# Patient Record
Sex: Female | Born: 1950 | Race: White | Hispanic: No | Marital: Married | Smoking: Never smoker
Health system: Southern US, Community
[De-identification: ages and names within clinical notes are randomized; demographics above are authoritative.]

## PROBLEM LIST (undated history)

## (undated) DIAGNOSIS — D649 Anemia, unspecified: Secondary | ICD-10-CM

## (undated) DIAGNOSIS — R011 Cardiac murmur, unspecified: Secondary | ICD-10-CM

## (undated) DIAGNOSIS — Z9889 Other specified postprocedural states: Secondary | ICD-10-CM

## (undated) DIAGNOSIS — E041 Nontoxic single thyroid nodule: Secondary | ICD-10-CM

## (undated) DIAGNOSIS — R112 Nausea with vomiting, unspecified: Secondary | ICD-10-CM

## (undated) DIAGNOSIS — M1990 Unspecified osteoarthritis, unspecified site: Secondary | ICD-10-CM

## (undated) DIAGNOSIS — Z8711 Personal history of peptic ulcer disease: Secondary | ICD-10-CM

## (undated) DIAGNOSIS — G43909 Migraine, unspecified, not intractable, without status migrainosus: Secondary | ICD-10-CM

## (undated) DIAGNOSIS — F32A Depression, unspecified: Secondary | ICD-10-CM

## (undated) DIAGNOSIS — I340 Nonrheumatic mitral (valve) insufficiency: Secondary | ICD-10-CM

## (undated) DIAGNOSIS — I341 Nonrheumatic mitral (valve) prolapse: Secondary | ICD-10-CM

## (undated) DIAGNOSIS — K219 Gastro-esophageal reflux disease without esophagitis: Secondary | ICD-10-CM

## (undated) DIAGNOSIS — K8689 Other specified diseases of pancreas: Principal | ICD-10-CM

## (undated) DIAGNOSIS — F419 Anxiety disorder, unspecified: Secondary | ICD-10-CM

## (undated) DIAGNOSIS — F329 Major depressive disorder, single episode, unspecified: Secondary | ICD-10-CM

## (undated) DIAGNOSIS — R079 Chest pain, unspecified: Secondary | ICD-10-CM

## (undated) DIAGNOSIS — R0609 Other forms of dyspnea: Secondary | ICD-10-CM

## (undated) HISTORY — PX: BREAST BIOPSY: SHX20

## (undated) HISTORY — DX: Chest pain, unspecified: R07.9

## (undated) HISTORY — DX: Other specified diseases of pancreas: K86.89

## (undated) HISTORY — DX: Nonrheumatic mitral (valve) insufficiency: I34.0

## (undated) HISTORY — PX: BREAST LUMPECTOMY: SHX2

## (undated) HISTORY — DX: Other forms of dyspnea: R06.09

## (undated) HISTORY — PX: TUBAL LIGATION: SHX77

## (undated) HISTORY — DX: Unspecified osteoarthritis, unspecified site: M19.90

## (undated) HISTORY — PX: BACK SURGERY: SHX140

## (undated) HISTORY — DX: Nonrheumatic mitral (valve) prolapse: I34.1

## (undated) HISTORY — PX: COLONOSCOPY: SHX174

## (undated) HISTORY — PX: EYE MUSCLE SURGERY: SHX370

## (undated) HISTORY — DX: Nontoxic single thyroid nodule: E04.1

---

## 1990-12-30 HISTORY — PX: LUMBAR DISC SURGERY: SHX700

## 1998-11-09 ENCOUNTER — Other Ambulatory Visit: Admission: RE | Admit: 1998-11-09 | Discharge: 1998-11-09 | Payer: Self-pay | Admitting: Obstetrics and Gynecology

## 2001-01-30 ENCOUNTER — Other Ambulatory Visit: Admission: RE | Admit: 2001-01-30 | Discharge: 2001-01-30 | Payer: Self-pay | Admitting: Obstetrics and Gynecology

## 2002-02-04 ENCOUNTER — Other Ambulatory Visit: Admission: RE | Admit: 2002-02-04 | Discharge: 2002-02-04 | Payer: Self-pay | Admitting: Obstetrics and Gynecology

## 2003-05-19 ENCOUNTER — Other Ambulatory Visit: Admission: RE | Admit: 2003-05-19 | Discharge: 2003-05-19 | Payer: Self-pay | Admitting: Obstetrics and Gynecology

## 2004-07-04 ENCOUNTER — Encounter: Admission: RE | Admit: 2004-07-04 | Discharge: 2004-07-04 | Payer: Self-pay | Admitting: Obstetrics and Gynecology

## 2004-07-04 ENCOUNTER — Encounter (INDEPENDENT_AMBULATORY_CARE_PROVIDER_SITE_OTHER): Payer: Self-pay | Admitting: Specialist

## 2004-07-18 ENCOUNTER — Other Ambulatory Visit: Admission: RE | Admit: 2004-07-18 | Discharge: 2004-07-18 | Payer: Self-pay | Admitting: Obstetrics and Gynecology

## 2004-08-07 ENCOUNTER — Encounter: Admission: RE | Admit: 2004-08-07 | Discharge: 2004-08-07 | Payer: Self-pay | Admitting: General Surgery

## 2004-08-07 ENCOUNTER — Ambulatory Visit (HOSPITAL_COMMUNITY): Admission: RE | Admit: 2004-08-07 | Discharge: 2004-08-07 | Payer: Self-pay | Admitting: General Surgery

## 2004-08-07 ENCOUNTER — Encounter (INDEPENDENT_AMBULATORY_CARE_PROVIDER_SITE_OTHER): Payer: Self-pay | Admitting: Specialist

## 2004-08-07 ENCOUNTER — Ambulatory Visit (HOSPITAL_BASED_OUTPATIENT_CLINIC_OR_DEPARTMENT_OTHER): Admission: RE | Admit: 2004-08-07 | Discharge: 2004-08-07 | Payer: Self-pay | Admitting: General Surgery

## 2005-10-22 ENCOUNTER — Other Ambulatory Visit: Admission: RE | Admit: 2005-10-22 | Discharge: 2005-10-22 | Payer: Self-pay | Admitting: Obstetrics and Gynecology

## 2005-11-26 ENCOUNTER — Ambulatory Visit: Payer: Self-pay | Admitting: Oncology

## 2005-12-26 ENCOUNTER — Ambulatory Visit: Payer: Self-pay | Admitting: Gastroenterology

## 2006-01-16 ENCOUNTER — Ambulatory Visit: Payer: Self-pay | Admitting: Gastroenterology

## 2006-02-14 ENCOUNTER — Ambulatory Visit: Payer: Self-pay | Admitting: Oncology

## 2006-04-11 ENCOUNTER — Ambulatory Visit: Payer: Self-pay | Admitting: Oncology

## 2006-07-04 ENCOUNTER — Ambulatory Visit: Payer: Self-pay | Admitting: Oncology

## 2006-07-07 LAB — CBC WITH DIFFERENTIAL (CANCER CENTER ONLY)
BASO%: 1.2 % (ref 0.0–2.0)
HGB: 15.5 g/dL (ref 11.6–15.9)
LYMPH#: 1.7 10*3/uL (ref 0.9–3.3)
LYMPH%: 25.7 % (ref 14.0–48.0)
MCH: 30.2 pg (ref 26.0–34.0)
MCHC: 32.7 g/dL (ref 32.0–36.0)
MCV: 92 fL (ref 81–101)
NEUT#: 4.1 10*3/uL (ref 1.5–6.5)
NEUT%: 60.9 % (ref 39.6–80.0)
Platelets: 233 10*3/uL (ref 145–400)
RBC: 5.12 10*6/uL (ref 3.70–5.32)
WBC: 6.7 10*3/uL (ref 3.9–10.0)

## 2006-07-07 LAB — COMPREHENSIVE METABOLIC PANEL
ALT: 8 U/L (ref 0–40)
BUN: 23 mg/dL (ref 6–23)
CO2: 27 mEq/L (ref 19–32)
Calcium: 9.6 mg/dL (ref 8.4–10.5)
Glucose, Bld: 102 mg/dL — ABNORMAL HIGH (ref 70–99)

## 2006-07-07 LAB — IRON AND TIBC
%SAT: 36 % (ref 20–55)
TIBC: 316 ug/dL (ref 250–470)
UIBC: 202 ug/dL

## 2007-01-02 ENCOUNTER — Ambulatory Visit: Payer: Self-pay | Admitting: Oncology

## 2007-01-05 LAB — CBC WITH DIFFERENTIAL (CANCER CENTER ONLY)
BASO%: 0.8 % (ref 0.0–2.0)
HCT: 45.8 % (ref 34.8–46.6)
HGB: 15.5 g/dL (ref 11.6–15.9)
MCHC: 33.9 g/dL (ref 32.0–36.0)
MCV: 92 fL (ref 81–101)
MONO#: 0.5 10*3/uL (ref 0.1–0.9)
MONO%: 7.2 % (ref 0.0–13.0)
RDW: 11.6 % (ref 10.5–14.6)

## 2007-01-05 LAB — IRON AND TIBC
%SAT: 39 % (ref 20–55)
TIBC: 368 ug/dL (ref 250–470)
UIBC: 225 ug/dL

## 2007-02-27 ENCOUNTER — Ambulatory Visit: Payer: Self-pay | Admitting: Oncology

## 2007-07-06 ENCOUNTER — Ambulatory Visit: Payer: Self-pay | Admitting: Oncology

## 2007-07-07 LAB — CBC WITH DIFFERENTIAL (CANCER CENTER ONLY)
EOS%: 10.3 % — ABNORMAL HIGH (ref 0.0–7.0)
Eosinophils Absolute: 0.7 10*3/uL — ABNORMAL HIGH (ref 0.0–0.5)
LYMPH#: 1.8 10*3/uL (ref 0.9–3.3)
MCH: 31.2 pg (ref 26.0–34.0)
NEUT#: 3.4 10*3/uL (ref 1.5–6.5)
Platelets: 215 10*3/uL (ref 145–400)
RBC: 5.12 10*6/uL (ref 3.70–5.32)
RDW: 12.2 % (ref 10.5–14.6)

## 2007-07-07 LAB — COMPREHENSIVE METABOLIC PANEL
ALT: 15 U/L (ref 0–35)
Albumin: 3.9 g/dL (ref 3.5–5.2)
Alkaline Phosphatase: 64 U/L (ref 39–117)
BUN: 17 mg/dL (ref 6–23)
CO2: 27 mEq/L (ref 19–32)
Potassium: 4 mEq/L (ref 3.5–5.3)
Sodium: 141 mEq/L (ref 135–145)
Total Protein: 5.9 g/dL — ABNORMAL LOW (ref 6.0–8.3)

## 2007-07-07 LAB — IRON AND TIBC: UIBC: 160 ug/dL

## 2007-07-07 LAB — FERRITIN: Ferritin: 134 ng/mL (ref 10–291)

## 2007-11-09 ENCOUNTER — Ambulatory Visit: Payer: Self-pay | Admitting: Oncology

## 2007-11-10 LAB — COMPREHENSIVE METABOLIC PANEL
ALT: 12 U/L (ref 0–35)
BUN: 12 mg/dL (ref 6–23)
CO2: 27 mEq/L (ref 19–32)
Calcium: 9.6 mg/dL (ref 8.4–10.5)
Chloride: 107 mEq/L (ref 96–112)
Creatinine, Ser: 0.69 mg/dL (ref 0.40–1.20)
Glucose, Bld: 97 mg/dL (ref 70–99)
Potassium: 4 mEq/L (ref 3.5–5.3)
Total Protein: 6.8 g/dL (ref 6.0–8.3)

## 2007-11-10 LAB — CBC WITH DIFFERENTIAL (CANCER CENTER ONLY)
Eosinophils Absolute: 0.4 10*3/uL (ref 0.0–0.5)
HCT: 45.2 % (ref 34.8–46.6)
LYMPH%: 24.9 % (ref 14.0–48.0)
MCH: 31 pg (ref 26.0–34.0)
MCHC: 34 g/dL (ref 32.0–36.0)
MCV: 91 fL (ref 81–101)
MONO#: 0.5 10*3/uL (ref 0.1–0.9)
MONO%: 5.8 % (ref 0.0–13.0)
RBC: 4.96 10*6/uL (ref 3.70–5.32)
WBC: 7.9 10*3/uL (ref 3.9–10.0)

## 2007-11-10 LAB — IRON AND TIBC
%SAT: 47 % (ref 20–55)
Iron: 146 ug/dL — ABNORMAL HIGH (ref 42–145)
TIBC: 312 ug/dL (ref 250–470)
UIBC: 166 ug/dL

## 2008-04-15 ENCOUNTER — Ambulatory Visit: Payer: Self-pay | Admitting: Oncology

## 2008-04-20 LAB — IRON AND TIBC
%SAT: 30 % (ref 20–55)
Iron: 84 ug/dL (ref 42–145)

## 2008-04-20 LAB — CBC WITH DIFFERENTIAL (CANCER CENTER ONLY)
Eosinophils Absolute: 0.3 10*3/uL (ref 0.0–0.5)
HGB: 15 g/dL (ref 11.6–15.9)
LYMPH%: 27 % (ref 14.0–48.0)
MCV: 87 fL (ref 81–101)
MONO%: 6.8 % (ref 0.0–13.0)
Platelets: 243 10*3/uL (ref 145–400)
WBC: 6.5 10*3/uL (ref 3.9–10.0)

## 2008-04-20 LAB — FERRITIN: Ferritin: 79 ng/mL (ref 10–291)

## 2008-11-02 ENCOUNTER — Ambulatory Visit: Payer: Self-pay | Admitting: Oncology

## 2008-11-03 LAB — CBC WITH DIFFERENTIAL (CANCER CENTER ONLY)
BASO#: 0.1 10*3/uL (ref 0.0–0.2)
Eosinophils Absolute: 0.4 10*3/uL (ref 0.0–0.5)
HCT: 44.1 % (ref 34.8–46.6)
HGB: 15.3 g/dL (ref 11.6–15.9)
MCH: 31.1 pg (ref 26.0–34.0)
MONO%: 6.9 % (ref 0.0–13.0)
NEUT#: 4.4 10*3/uL (ref 1.5–6.5)
NEUT%: 59.2 % (ref 39.6–80.0)
Platelets: 206 10*3/uL (ref 145–400)
RBC: 4.91 10*6/uL (ref 3.70–5.32)
WBC: 7.4 10*3/uL (ref 3.9–10.0)

## 2008-11-03 LAB — IRON AND TIBC: %SAT: 30 % (ref 20–55)

## 2008-11-03 LAB — FERRITIN: Ferritin: 97 ng/mL (ref 10–291)

## 2011-05-17 NOTE — Op Note (Signed)
Becky Murphy, Becky Murphy                          ACCOUNT NO.:  [id number]   MEDICAL RECORD NO.:  [id number]                   PATIENT TYPE:  AMB   LOCATION:  DSC                                  FACILITY:  [facility]   PHYSICIAN:  Timothy E. Earlene Plater, M.D.              DATE OF BIRTH:  [birth year]   DATE OF PROCEDURE:  08/07/2004  DATE OF DISCHARGE:                                 OPERATIVE REPORT   PREOPERATIVE DIAGNOSIS:  Mass, left breast.   POSTOPERATIVE DIAGNOSIS:  Mass, left breast.   OPERATION PERFORMED:  Needle localized left breast biopsy.   SURGEON:  Timothy E. Earlene Plater, M.D.   ANESTHESIA:  Local standby.   INDICATIONS FOR PROCEDURE:  Ms. Bisesi has a family history of breast cancer,  has a persistent abnormality on mammography and though a core biopsy showed  fibroadenoma, she wishes to have it completely removed after careful  discussion.  She was at the breast center of [facility] this morning, had a  needle localization, was seen preop, identified, and the permit signed.   DESCRIPTION OF PROCEDURE:  The patient was taken back to the operating room  and placed supine.  IV started and sedation given.  The left breast was  prepped and draped.  The wire entered the breast far lateral at the 3  o'clock position.  I elected to make my incision at the entry point and  follow the  needle into the mass.  This was accomplished after  administration of 0.25% Marcaine with epinephrine with epinephrine.  Once  within the breast tissue, the mass was palpable.  It was completely  dissected around and removed.  I did bisect the specimen myself and I think  the tissue is suspicious but we will await pathology.  The specimen  mammogram was returned as specimen within the tissue.  Bleeding was  controlled with a cautery.  Wound was closed in layers with 3-0  Monocryl, Steri-Strips and dry sterile dressing applied.  Counts correct.  She tolerated it well and was removed to the recovery room in good  condition.   Written and verbal instructions given, along with Vicodin  #24 and she will  be seen and followed in the office.                                               Timothy E. Earlene Plater, M.D.    TED/MEDQ  D:  08/07/2004  T:  08/07/2004  Job:  [id number]   cc:   Marcelino Duster L. Vincente Poli, M.D.  [address], [address]  [place]  [place] [address]  Fax: [phone]   [facility]

## 2012-07-12 ENCOUNTER — Encounter (HOSPITAL_COMMUNITY): Payer: Self-pay | Admitting: Emergency Medicine

## 2012-07-12 ENCOUNTER — Emergency Department (HOSPITAL_COMMUNITY)
Admission: EM | Admit: 2012-07-12 | Discharge: 2012-07-12 | Disposition: A | Discharge status: Home / Self Care | Payer: BC Managed Care – PPO | Source: Home / Self Care | Attending: Emergency Medicine | Admitting: Emergency Medicine

## 2012-07-12 DIAGNOSIS — B029 Zoster without complications: Secondary | ICD-10-CM

## 2012-07-12 HISTORY — DX: Anxiety disorder, unspecified: F41.9

## 2012-07-12 MED ORDER — PREDNISONE 20 MG PO TABS: 20.0000 mg | ORAL_TABLET | Freq: Every day | ORAL | Status: AC

## 2012-07-12 MED ORDER — HYDROCODONE-ACETAMINOPHEN 5-500 MG PO TABS: 1.0000 | ORAL_TABLET | Freq: Four times a day (QID) | ORAL | Status: AC | PRN

## 2012-07-12 MED ORDER — ACYCLOVIR 400 MG PO TABS: 800.0000 mg | ORAL_TABLET | Freq: Three times a day (TID) | ORAL | Status: AC

## 2012-07-12 NOTE — ED Provider Notes (Signed)
History     CSN: [id number]  Arrival date & time 07/12/12  1543   First MD Initiated Contact with Patient 07/12/12 1545      Chief Complaint  Patient presents with  . Herpes Zoster    (Consider location/radiation/quality/duration/timing/severity/associated sxs/prior treatment) HPI Comments: Patient presents urgent care describing that for about 2 weeks she started having this rash on the right side of her neck and upper back moving towards her right armpit. The pain is severe, and burning in character. "As I thought originally that this was poison ivy or poison oak as he was doing some yard work". Someone I know told me that this could be shingles. It's still hurting all over the right upper part my chest and now moving towards my armpit. It also itches.  Patient denies any systemic symptoms or constitutional symptoms such as fevers, generalized malaise, arthralgias, myalgias, headaches, no nausea vomiting or eye discomfort or excessive tearing.  The history is provided by the patient.    Past Medical History  Diagnosis Date  . Anxiety     Past Surgical History  Procedure Date  . Back surgery     No family history on file.  History  Substance Use Topics  . Smoking status: Never Smoker   . Smokeless tobacco: Not on file  . Alcohol Use: Yes    OB History    Grav Para Term Preterm Abortions TAB SAB Ect Mult Living                  Review of Systems  Constitutional: Positive for activity change. Negative for fever, chills, fatigue and unexpected weight change.  HENT: Negative for facial swelling, neck pain and neck stiffness.   Skin: Positive for rash. Negative for wound.  Neurological: Negative for dizziness, weakness and numbness.    Allergies  Review of patient's allergies indicates no known allergies.  Home Medications   Current Outpatient Rx  Name Route Sig Dispense Refill  . FLUOXETINE HCL 40 MG PO CAPS Oral Take 40 mg by mouth daily.    . ACYCLOVIR 400  MG PO TABS Oral Take 2 tablets (800 mg total) by mouth 3 (three) times daily. 30 tablet 0  . HYDROCODONE-ACETAMINOPHEN 5-500 MG PO TABS Oral Take 1-2 tablets by mouth every 6 (six) hours as needed for pain. 15 tablet 0  . PREDNISONE 20 MG PO TABS Oral Take 1 tablet (20 mg total) by mouth daily. 10 tablet 0    BP 126/84  Pulse 72  Temp 98.1 F (36.7 C) (Oral)  Resp 20  SpO2 96%  Physical Exam  Nursing note and vitals reviewed. Constitutional: She appears well-developed and well-nourished.  Non-toxic appearance. She does not have a sickly appearance. She does not appear ill. No distress.  HENT:  Head: Normocephalic.  Eyes: Conjunctivae are normal.  Neck: Trachea normal and normal range of motion. Neck supple. No Brudzinski's sign and no Kernig's sign noted.  Neurological: She is alert.  Skin: Purpura and rash noted. Rash is papular and vesicular. There is erythema.       ED Course  Procedures (including critical care time)  Labs Reviewed - No data to display No results found.   1. Herpes zoster       MDM  C4-C5, dermatome distribution of zoster. Patient initial rash and discomfort originated about 2 weeks ago. The patient alleges that she has 2 new patches one towards the axillary area and the other one approximating her sternum age that  have developed within the last 2-3 days.        Jimmie Molly, MD 07/12/12 [phone]

## 2012-07-12 NOTE — ED Notes (Signed)
Patient not part of this nurses assignment-delay in info entry

## 2012-07-12 NOTE — ED Notes (Signed)
Reports painful, itchy rash to right upper chest, circling right side of neck to the back of neck.  Reports pain shooting down right arm.  Onset 2 weeks ago.

## 2012-11-05 ENCOUNTER — Other Ambulatory Visit: Payer: Self-pay | Admitting: Obstetrics and Gynecology

## 2013-11-08 ENCOUNTER — Other Ambulatory Visit: Payer: Self-pay | Admitting: Obstetrics and Gynecology

## 2014-11-10 ENCOUNTER — Other Ambulatory Visit: Payer: Self-pay | Admitting: Obstetrics and Gynecology

## 2014-11-11 LAB — CYTOLOGY - PAP

## 2015-05-15 ENCOUNTER — Ambulatory Visit (INDEPENDENT_AMBULATORY_CARE_PROVIDER_SITE_OTHER): Payer: BC Managed Care – PPO | Admitting: Physician Assistant

## 2015-05-15 VITALS — BP 120/86 | HR 63 | Temp 98.6°F | Resp 16 | Ht 64.25 in | Wt 142.6 lb

## 2015-05-15 DIAGNOSIS — T148 Other injury of unspecified body region: Secondary | ICD-10-CM

## 2015-05-15 DIAGNOSIS — W57XXXA Bitten or stung by nonvenomous insect and other nonvenomous arthropods, initial encounter: Secondary | ICD-10-CM | POA: Diagnosis not present

## 2015-05-15 DIAGNOSIS — R110 Nausea: Secondary | ICD-10-CM | POA: Diagnosis not present

## 2015-05-15 DIAGNOSIS — R252 Cramp and spasm: Secondary | ICD-10-CM | POA: Diagnosis not present

## 2015-05-15 DIAGNOSIS — R011 Cardiac murmur, unspecified: Secondary | ICD-10-CM | POA: Diagnosis not present

## 2015-05-15 LAB — COMPLETE METABOLIC PANEL WITH GFR
ALK PHOS: 68 U/L (ref 39–117)
ALT: 15 U/L (ref 0–35)
AST: 22 U/L (ref 0–37)
Albumin: 4.4 g/dL (ref 3.5–5.2)
BILIRUBIN TOTAL: 0.9 mg/dL (ref 0.2–1.2)
BUN: 12 mg/dL (ref 6–23)
CO2: 21 mEq/L (ref 19–32)
Calcium: 9.6 mg/dL (ref 8.4–10.5)
Chloride: 106 mEq/L (ref 96–112)
Creat: 0.62 mg/dL (ref 0.50–1.10)
GFR, Est African American: >89 mL/min
GFR, Est Non African American: >89 mL/min
GLUCOSE: 89 mg/dL (ref 70–99)
Potassium: 3.9 mEq/L (ref 3.5–5.3)
Sodium: 140 mEq/L (ref 135–145)
Total Protein: 6.8 g/dL (ref 6.0–8.3)

## 2015-05-15 LAB — CBC
HEMATOCRIT: 45 % (ref 36.0–46.0)
Hemoglobin: 15.8 g/dL — ABNORMAL HIGH (ref 12.0–15.0)
MCH: 31.5 pg (ref 26.0–34.0)
MCHC: 35.1 g/dL (ref 30.0–36.0)
MCV: 89.6 fL (ref 78.0–100.0)
MPV: 10.2 fL (ref 8.6–12.4)
PLATELETS: 212 10*3/uL (ref 150–400)
RBC: 5.02 MIL/uL (ref 3.87–5.11)
RDW: 13.9 % (ref 11.5–15.5)
WBC: 6.8 10*3/uL (ref 4.0–10.5)

## 2015-05-15 MED ORDER — DOXYCYCLINE HYCLATE 100 MG PO CAPS: 100.0000 mg | ORAL_CAPSULE | Freq: Two times a day (BID) | ORAL | Status: AC

## 2015-05-15 NOTE — Progress Notes (Signed)
[facility] [address], [place] [address] [phone]  Date:  05/15/2015   Name:  Becky RunningSandra H Murphy   DOB:  [birth year]   MRN:  [id number]  PCP:  No primary care provider on file.    Chief Complaint: Rash; Pruritis; and Nausea   History of Present Illness:  Becky RunningSandra H Murphy is a 64 y.o. very pleasant female patient who presents with the following:  She reports more than one week of a bump that appeared and has gradually increased in size.  Following an outdoor event in a wooded area, as she was riding home, she felt a small bump the right side of her abdomen, she scratched the way home.  When she looked, there was a small red spot with a white center, and a red circle around the area.  Since the initial siting, the outer ring has increased in size, and has been bluish and bruised looking.  She states that it just felt sensitive.  After several days of it's presence she also noticed that she would have a wave a nausea that would last for seconds.  She also had a cramping right leg and toes.  There is no fever, though she has taken ibuprofen and aspirin throughout.  She has no dyspnea or sob.  She ha never had an allergic reaction to insects.  She never saw any insect or tic on her or her contacts.  She has no generalized body aches or malaise, though she does feel more fatigued.  Murmur: She has never been told she has a murmur.  She denies chest pains, sob, dyspnea, palpitations, syncope, cough, or leg swelling.  There are no active problems to display for this patient.   Past Medical History  Diagnosis Date  . Anxiety   . Arthritis     Past Surgical History  Procedure Laterality Date  . Back surgery    . Back surgery  1992    History  Substance Use Topics  . Smoking status: Never Smoker   . Smokeless tobacco: Not on file  . Alcohol Use: Yes    Family History  Problem Relation Age of Onset  . Cancer Mother   . Cancer Father   . Cancer Sister      No Known Allergies  Medication list has been reviewed and updated.  Current Outpatient Prescriptions on File Prior to Visit  Medication Sig Dispense Refill  . FLUoxetine (PROZAC) 40 MG capsule Take 40 mg by mouth daily.     No current facility-administered medications on file prior to visit.    Review of Systems: ROS othewise unremarkable unless listed above.    Physical Examination: Filed Vitals:   05/15/15 0927  BP: 120/86  Pulse: 63  Temp: 98.6 F (37 C)  Resp: 63   Filed Vitals:   05/15/15 0927  Height: 5' 4.25" (1.632 m)  Weight: 142 lb 9.6 oz (64.683 kg)   Body mass index is 24.29 kg/(m^2). Ideal Body Weight: Weight in (lb) to have BMI = 25: 146.5  Physical Exam  Constitutional: She is oriented to person, place, and time. She appears well-developed and well-nourished. No distress.  HENT:  Head: Normocephalic and atraumatic.  Eyes: Conjunctivae are normal. Pupils are equal, round, and reactive to light. No scleral icterus.  Neck: Normal range of motion.  Cardiovascular: Normal rate, regular rhythm and intact distal pulses.  Exam reveals no friction rub.   Murmur (2-3 grade midsystolic murmur) heard. Pulmonary/Chest: Effort normal  and breath sounds normal. No respiratory distress. She has no wheezes.  Lymphadenopathy:    She has no cervical adenopathy.  Neurological: She is alert and oriented to person, place, and time.  Normal ocular movement  Skin: Skin is warm and dry. She is not diaphoretic.  Annular shaped lesion about 8cm diameter, central clearing, with mild petechiae surrounding a papular dry site.  Very mild tenderness at this area.  Site erythema migrans in appearance.   Psychiatric: She has a normal mood and affect. Her behavior is normal.     Assessment and Plan: 64 year old female is here today for a lesion at right side of abdomen.  Diff dx.: cellulitis vs. Lyme disease. -Placing on doxycycline to cover for both.  Pending lyme disease lab  results, may need to lengthen therapy to 21 days. -She declines zofran at this time.   -Referral to cardiology for work up of this murmur, as this appears to be a new finding.  Insect bite - Plan: CBC, COMPLETE METABOLIC PANEL WITH GFR, B. burgdorfi antibodies, doxycycline (VIBRAMYCIN) 100 MG capsule  Nausea without vomiting - Plan: CBC, COMPLETE METABOLIC PANEL WITH GFR, doxycycline (VIBRAMYCIN) 100 MG capsule  Cramps of right lower extremity - Plan: B. burgdorfi antibodies, doxycycline (VIBRAMYCIN) 100 MG capsule  Midsystolic murmur - Plan: Ambulatory referral to Cardiology  Trena PlattStephanie English, PA-C [facility] 5/16/201611:16 AM

## 2015-05-15 NOTE — Patient Instructions (Addendum)
Keep site clean with washing site twice per day with soap and water.   Please await referral for cardiology consult. You can take ibuprofen/tylenol for your headache.  Please let us know if this is not improving.   I will be in contact within the next 10-14 days. Insect Bite Mosquitoes, flies, fleas, bedbugs, and many other insects can bite. Insect bites are different from insect stings. A sting is when venom is injected into the skin. Some insect bites can transmit infectious diseases. SYMPTOMS  Insect bites usually turn red, swell, and itch for 2 to 4 days. They often go away on their own. TREATMENT  Your caregiver may prescribe antibiotic medicines if a bacterial infection develops in the bite. HOME CARE INSTRUCTIONS  Do not scratch the bite area.  Keep the bite area clean and dry. Wash the bite area thoroughly with soap and water.  Put ice or cool compresses on the bite area.  Put ice in a plastic bag.  Place a towel between your skin and the bag.  Leave the ice on for 20 minutes, 4 times a day for the first 2 to 3 days, or as directed.  You may apply a baking soda paste, cortisone cream, or calamine lotion to the bite area as directed by your caregiver. This can help reduce itching and swelling.  Only take over-the-counter or prescription medicines as directed by your caregiver.  If you are given antibiotics, take them as directed. Finish them even if you start to feel better. You may need a tetanus shot if:  You cannot remember when you had your last tetanus shot.  You have never had a tetanus shot.  The injury broke your skin. If you get a tetanus shot, your arm may swell, get red, and feel warm to the touch. This is common and not a problem. If you need a tetanus shot and you choose not to have one, there is a rare chance of getting tetanus. Sickness from tetanus can be serious. SEEK IMMEDIATE MEDICAL CARE IF:   You have increased pain, redness, or swelling in the bite  area.  You see a red line on the skin coming from the bite.  You have a fever.  You have joint pain.  You have a headache or neck pain.  You have unusual weakness.  You have a rash.  You have chest pain or shortness of breath.  You have abdominal pain, nausea, or vomiting.  You feel unusually tired or sleepy. MAKE SURE YOU:   Understand these instructions.  Will watch your condition.  Will get help right away if you are not doing well or get worse. Document Released: 01/23/2005 Document Revised: 03/09/2012 Document Reviewed: 07/17/2011 [facility] Patient Information 2015 [employer] [place]. This information is not intended to replace advice given to you by your health care provider. Make sure you discuss any questions you have with your health care provider.

## 2015-05-16 LAB — B. BURGDORFI ANTIBODIES: B burgdorferi Ab IgG+IgM: 0.66 {ISR}

## 2015-11-02 ENCOUNTER — Observation Stay (HOSPITAL_COMMUNITY)
Admission: EM | Admit: 2015-11-02 | Discharge: 2015-11-03 | Disposition: A | Discharge status: Home / Self Care | Payer: BC Managed Care – PPO | Attending: Internal Medicine | Admitting: Internal Medicine

## 2015-11-02 ENCOUNTER — Other Ambulatory Visit: Payer: Self-pay

## 2015-11-02 ENCOUNTER — Emergency Department (HOSPITAL_COMMUNITY): Payer: BC Managed Care – PPO

## 2015-11-02 ENCOUNTER — Encounter (HOSPITAL_COMMUNITY): Payer: Self-pay | Admitting: Emergency Medicine

## 2015-11-02 DIAGNOSIS — Z79899 Other long term (current) drug therapy: Secondary | ICD-10-CM | POA: Insufficient documentation

## 2015-11-02 DIAGNOSIS — F419 Anxiety disorder, unspecified: Secondary | ICD-10-CM | POA: Diagnosis present

## 2015-11-02 DIAGNOSIS — R1032 Left lower quadrant pain: Secondary | ICD-10-CM | POA: Diagnosis not present

## 2015-11-02 DIAGNOSIS — M1990 Unspecified osteoarthritis, unspecified site: Secondary | ICD-10-CM | POA: Diagnosis not present

## 2015-11-02 DIAGNOSIS — R079 Chest pain, unspecified: Secondary | ICD-10-CM | POA: Diagnosis not present

## 2015-11-02 DIAGNOSIS — R1012 Left upper quadrant pain: Secondary | ICD-10-CM | POA: Insufficient documentation

## 2015-11-02 HISTORY — DX: Major depressive disorder, single episode, unspecified: F32.9

## 2015-11-02 HISTORY — DX: Chest pain, unspecified: R07.9

## 2015-11-02 HISTORY — DX: Depression, unspecified: F32.A

## 2015-11-02 HISTORY — DX: Migraine, unspecified, not intractable, without status migrainosus: G43.909

## 2015-11-02 HISTORY — DX: Personal history of peptic ulcer disease: Z87.11

## 2015-11-02 LAB — COMPREHENSIVE METABOLIC PANEL
ALBUMIN: 3.7 g/dL (ref 3.5–5.0)
ALK PHOS: 60 U/L (ref 38–126)
ALT: 14 U/L (ref 14–54)
AST: 21 U/L (ref 15–41)
Anion gap: 10 (ref 5–15)
BUN: 17 mg/dL (ref 6–20)
CALCIUM: 9 mg/dL (ref 8.9–10.3)
CO2: 22 mmol/L (ref 22–32)
CREATININE: 0.69 mg/dL (ref 0.44–1.00)
Chloride: 108 mmol/L (ref 101–111)
GFR calc Af Amer: >60 mL/min (ref >60)
GFR calc non Af Amer: >60 mL/min (ref >60)
GLUCOSE: 135 mg/dL — AB (ref 65–99)
Potassium: 3 mmol/L — ABNORMAL LOW (ref 3.5–5.1)
SODIUM: 140 mmol/L (ref 135–145)
Total Bilirubin: 0.8 mg/dL (ref 0.3–1.2)
Total Protein: 5.9 g/dL — ABNORMAL LOW (ref 6.5–8.1)

## 2015-11-02 LAB — CBC
HEMATOCRIT: 43.4 % (ref 36.0–46.0)
Hemoglobin: 14.8 g/dL (ref 12.0–15.0)
MCH: 31.4 pg (ref 26.0–34.0)
MCHC: 34.1 g/dL (ref 30.0–36.0)
MCV: 91.9 fL (ref 78.0–100.0)
Platelets: 196 10*3/uL (ref 150–400)
RBC: 4.72 MIL/uL (ref 3.87–5.11)
RDW: 13.4 % (ref 11.5–15.5)
WBC: 6.8 10*3/uL (ref 4.0–10.5)

## 2015-11-02 LAB — I-STAT TROPONIN, ED
Troponin i, poc: 0 ng/mL (ref 0.00–0.08)
Troponin i, poc: 0 ng/mL (ref 0.00–0.08)

## 2015-11-02 LAB — D-DIMER, QUANTITATIVE: D-Dimer, Quant: <0.27 ug/mL-FEU (ref 0.00–0.48)

## 2015-11-02 LAB — TROPONIN I

## 2015-11-02 LAB — LIPASE, BLOOD: Lipase: 27 U/L (ref 11–51)

## 2015-11-02 MED ORDER — PANTOPRAZOLE SODIUM 40 MG PO TBEC
40.0000 mg | DELAYED_RELEASE_TABLET | Freq: Every day | ORAL | Status: DC
  Administered 2015-11-03: 40 mg via ORAL
  Filled 2015-11-02: qty 1

## 2015-11-02 MED ORDER — ACETAMINOPHEN 325 MG PO TABS: 650.0000 mg | ORAL_TABLET | ORAL | Status: DC | PRN

## 2015-11-02 MED ORDER — POTASSIUM CHLORIDE ER 20 MEQ PO TBCR: 40.0000 meq | EXTENDED_RELEASE_TABLET | Freq: Every day | ORAL | Status: DC

## 2015-11-02 MED ORDER — ENOXAPARIN SODIUM 40 MG/0.4ML ~~LOC~~ SOLN
40.0000 mg | SUBCUTANEOUS | Status: DC
Start: 2015-11-02 — End: 2015-11-03
  Administered 2015-11-02: 40 mg via SUBCUTANEOUS
  Filled 2015-11-02: qty 0.4

## 2015-11-02 MED ORDER — FLUOXETINE HCL 20 MG PO CAPS
40.0000 mg | ORAL_CAPSULE | Freq: Every day | ORAL | Status: DC
  Administered 2015-11-03: 40 mg via ORAL
  Filled 2015-11-02: qty 2

## 2015-11-02 MED ORDER — SODIUM CHLORIDE 0.9 % IV SOLN
INTRAVENOUS | Status: DC
  Administered 2015-11-02: 20:00:00 via INTRAVENOUS

## 2015-11-02 MED ORDER — KETOROLAC TROMETHAMINE 30 MG/ML IJ SOLN
30.0000 mg | Freq: Three times a day (TID) | INTRAMUSCULAR | Status: DC
  Administered 2015-11-02 – 2015-11-03 (×3): 30 mg via INTRAVENOUS
  Filled 2015-11-02 (×3): qty 1

## 2015-11-02 MED ORDER — LORAZEPAM 1 MG PO TABS
0.5000 mg | ORAL_TABLET | Freq: Once | ORAL | Status: AC
  Administered 2015-11-02: 0.5 mg via ORAL
  Filled 2015-11-02: qty 1

## 2015-11-02 MED ORDER — ALPRAZOLAM 0.5 MG PO TABS
0.5000 mg | ORAL_TABLET | Freq: Three times a day (TID) | ORAL | Status: DC | PRN
  Administered 2015-11-02: 0.5 mg via ORAL
  Filled 2015-11-02: qty 1

## 2015-11-02 MED ORDER — GI COCKTAIL ~~LOC~~: 30.0000 mL | Freq: Four times a day (QID) | ORAL | Status: DC | PRN

## 2015-11-02 MED ORDER — ASPIRIN EC 325 MG PO TBEC
325.0000 mg | DELAYED_RELEASE_TABLET | Freq: Every day | ORAL | Status: DC
  Administered 2015-11-02 – 2015-11-03 (×2): 325 mg via ORAL
  Filled 2015-11-02 (×2): qty 1

## 2015-11-02 MED ORDER — MORPHINE SULFATE (PF) 2 MG/ML IV SOLN
2.0000 mg | INTRAVENOUS | Status: DC | PRN
  Administered 2015-11-02 – 2015-11-03 (×2): 2 mg via INTRAVENOUS
  Filled 2015-11-02 (×2): qty 1

## 2015-11-02 MED ORDER — NITROGLYCERIN 0.4 MG SL SUBL
0.4000 mg | SUBLINGUAL_TABLET | SUBLINGUAL | Status: DC | PRN
  Administered 2015-11-02 (×3): 0.4 mg via SUBLINGUAL
  Filled 2015-11-02: qty 1

## 2015-11-02 MED ORDER — ONDANSETRON HCL 4 MG/2ML IJ SOLN
4.0000 mg | Freq: Four times a day (QID) | INTRAMUSCULAR | Status: DC | PRN
  Administered 2015-11-02: 4 mg via INTRAVENOUS
  Filled 2015-11-02: qty 2

## 2015-11-02 MED ORDER — KETOROLAC TROMETHAMINE 30 MG/ML IJ SOLN
30.0000 mg | Freq: Once | INTRAMUSCULAR | Status: AC
  Administered 2015-11-02: 30 mg via INTRAVENOUS
  Filled 2015-11-02: qty 1

## 2015-11-02 MED ORDER — ACETAMINOPHEN 500 MG PO TABS: 500.0000 mg | ORAL_TABLET | Freq: Four times a day (QID) | ORAL | Status: DC | PRN

## 2015-11-02 MED ORDER — ACETAMINOPHEN 325 MG PO TABS
650.0000 mg | ORAL_TABLET | Freq: Once | ORAL | Status: AC
  Administered 2015-11-02: 650 mg via ORAL
  Filled 2015-11-02: qty 2

## 2015-11-02 MED ORDER — POTASSIUM CHLORIDE CRYS ER 20 MEQ PO TBCR
40.0000 meq | EXTENDED_RELEASE_TABLET | Freq: Once | ORAL | Status: AC
  Administered 2015-11-02: 40 meq via ORAL
  Filled 2015-11-02: qty 2

## 2015-11-02 NOTE — ED Notes (Signed)
Patient transported to X-ray 

## 2015-11-02 NOTE — ED Provider Notes (Signed)
CSN: [id number]     Arrival date & time 11/02/15  1141 History   First MD Initiated Contact with Patient 11/02/15 1144     Chief Complaint  Patient presents with  . Chest Pain     (Consider location/radiation/quality/duration/timing/severity/associated sxs/prior Treatment) HPI   Becky Murphy is a 64 y.o. female with PMH significant for anxiety and arthritis who presents with chest pain.  Onset, 5:30 AM. Location, left chest and radiates to her neck and back.  She describes it as a pressure, "feels like I have 10 lbs on my chest".  Not exertional.  Took 324 ASA at home.  Received 4 mg zofran and 2 NTG per EMS, and she states that the NTG made her pain worse.  No family history of MI at < 63 years of age.  Denies smoking history or drug use.   Past Medical History  Diagnosis Date  . Anxiety   . Arthritis    Past Surgical History  Procedure Laterality Date  . Back surgery    . Back surgery  1992   Family History  Problem Relation Age of Onset  . Cancer Mother   . Cancer Father   . Cancer Sister    Social History  Substance Use Topics  . Smoking status: Never Smoker   . Smokeless tobacco: None  . Alcohol Use: 3.6 oz/week    6 Glasses of wine per week   OB History    No data available     Review of Systems  All other systems negative unless otherwise stated in HPI   Allergies  Review of patient's allergies indicates no known allergies.  Home Medications   Prior to Admission medications   Medication Sig Start Date End Date Taking? Authorizing Provider  ALPRAZolam Prudy Feeler(XANAX) 0.5 MG tablet Take 0.5 mg by mouth 3 (three) times daily as needed for anxiety.   Yes Historical Provider, MD  diphenhydrAMINE (BENADRYL) 25 MG tablet Take 25 mg by mouth every 6 (six) hours as needed.   Yes Historical Provider, MD  FLUoxetine (PROZAC) 40 MG capsule Take 40 mg by mouth daily.   Yes Historical Provider, MD  ibuprofen (ADVIL,MOTRIN) 200 MG tablet Take 400 mg by mouth every 6 (six)  hours as needed.   Yes Historical Provider, MD   BP 128/85 mmHg  Pulse 67  Temp(Src) 97.7 F (36.5 C) (Oral)  Resp 11  Ht 5\' 6"  (1.676 m)  Wt 147 lb (66.679 kg)  BMI 23.74 kg/m2  SpO2 96% Physical Exam  Constitutional: She is oriented to person, place, and time. She appears well-developed and well-nourished. She appears distressed (mildly.  Appears anxious. ).  HENT:  Head: Normocephalic and atraumatic.  Mouth/Throat: Oropharynx is clear and moist.  Eyes: Conjunctivae and EOM are normal. Pupils are equal, round, and reactive to light.  Neck: Normal range of motion. Neck supple.  Cardiovascular: Normal rate, regular rhythm and normal heart sounds.   No murmur heard. Pulses:      Radial pulses are 2+ on the right side, and 2+ on the left side.       Posterior tibial pulses are 2+ on the right side, and 2+ on the left side.  Pulmonary/Chest: Effort normal and breath sounds normal. No accessory muscle usage or stridor. Tachypnea noted. No respiratory distress. She has no wheezes. She has no rhonchi. She has no rales.  Abdominal: Soft. Bowel sounds are normal. She exhibits no distension. There is tenderness in the left upper quadrant and left  lower quadrant. There is no rigidity, no rebound and no guarding.  Musculoskeletal: Normal range of motion.  Lymphadenopathy:    She has no cervical adenopathy.  Neurological: She is alert and oriented to person, place, and time.  Speech clear without dysarthria.  Skin: Skin is warm and dry.  Psychiatric: Her behavior is normal. Her mood appears anxious.    ED Course  Procedures (including critical care time) Labs Review Labs Reviewed  COMPREHENSIVE METABOLIC PANEL - Abnormal; Notable for the following:    Potassium 3.0 (*)    Glucose, Bld 135 (*)    Total Protein 5.9 (*)    All other components within normal limits  CBC  LIPASE, BLOOD  D-DIMER, QUANTITATIVE (NOT AT [facility])  Rosezena Sensor, ED  Rosezena Sensor, ED    Imaging  Review Dg Chest 2 View  11/02/2015  CLINICAL DATA:  Left shoulder and chest pain. Shortness of breath for 2 weeks. EXAM: CHEST  2 VIEW COMPARISON:  None. FINDINGS: The heart size and mediastinal contours are within normal limits. Both lungs are clear. The visualized skeletal structures are unremarkable. IMPRESSION: No active cardiopulmonary disease. Electronically Signed   By: Signa Kell M.D.   On: 11/02/2015 13:15   I have personally reviewed and evaluated these images and lab results as part of my medical decision-making.   EKG Interpretation   Date/Time:  Thursday November 02 2015 11:40:17 EDT Ventricular Rate:  72 PR Interval:  136 QRS Duration: 100 QT Interval:  440 QTC Calculation: 481 R Axis:   46 Text Interpretation:  Normal sinus rhythm Nonspecific ST abnormality  Nonspecific TW abnormality lateral leads, new from prior Similar TW  abnormality in lead III, new TW abnormality aVF Confirmed by [facility]  MD, ERIN ([id number]) on 11/02/2015 4:54:41 PM      MDM   Final diagnoses:  Chest pain, unspecified chest pain type    Patient presents with chest pain that began at 5:30 AM this morning.  VSS, she appears anxious.  Heart RRR, lungs CTAB.  She is tachpneic, suspect due to her anxiety level.  She is not hypoxic.  Abdomen is soft, she is tender in the LLQ and LUQ.  Will obtain CXR, CMP, lipase, CBC, d-dimer and troponin.  EKG shows NSR, and no acute changes.  Will obtain troponin at 3 hours as well.  Upon assessment by Dr. Dalene Seltzer, patient disclosed more exertional history.    EKG shows nonspecific ST abnormalities, no acute changes.  HEART score 4.  Will need admission for obs.  Troponin x 2,  0.00 CBC unremarkable CMP, lipase unremarkable D-dimer negative CXR shows no active cardiopulmonary disease.  Admit to hospitalist for further evaluation.  Case has been discussed with and seen by Dr. Dalene Seltzer who agrees with the above plan for admission.      Cheri Fowler,  PA-C 11/02/15 1734  Alvira Monday, MD 11/05/15 1351

## 2015-11-02 NOTE — Discharge Instructions (Signed)

## 2015-11-02 NOTE — ED Notes (Signed)
Pt arrives from home via [facility] c/o CP starting 0530.  EMS reports pt took 324 ASA x 3 at home.  EMS reports giving 4mg  zofran and 2 NTG.  Pt reports 6/10 with bilat numbness and tingling to hands and feet.  Pt appears very anxious, shallow resp, tremulous.

## 2015-11-02 NOTE — ED Notes (Signed)
MD at bedside. 

## 2015-11-02 NOTE — ED Notes (Addendum)
This RN ambulated pt to bathroom. Pt's gait unsteady and stumbling with pt bumping into doorframes.  Pt returned to bed, fall risk band placed on arm, yellow socks in place.  PA made aware.

## 2015-11-02 NOTE — H&P (Addendum)
PATIENT DETAILS Name: Becky Murphy Age: 64 y.o. Sex: female Date of Birth: 1951-07-23 Admit Date: 11/02/2015 PCP:No primary care provider on file. Referring Physician:Dr. Dalene SeltzerSchlossman    CHIEF COMPLAINT:  Chest pain  HPI: Becky Murphy is a 64 y.o. female with a Past Medical History of anxiety who presents today with the above noted complaint. Per patient, she has had intermittent left-sided chest pain for the past 3 weeks, but over the past day or so this has become more frequent and more severe-hence she presented to the ED for further evaluation. Patient describes the pain on the left side of her chest, intermittent in nature, 8/10 at its worst, pressure-like with occasional radiation to the left shoulder. This is also associated with occasional nausea, but no shortness of breath or diaphoresis. Pain is easily reproducible with gentle palpation, and with abduction and extension of her shoulders. Patient does acknowledge, working in her cabin lifting heavy cement blocks. She was evaluated in the emergency room, EKG and cardiac enzymes were nonacute-since her chest pain was persistent-the hospitalist service was asked to admit this patient for further evaluation and treatment. History of fever, headache, cough, vomiting, abdominal pain or diarrhea.   ALLERGIES:  No Known Allergies  PAST MEDICAL HISTORY: Past Medical History  Diagnosis Date  . Anxiety   . Arthritis     PAST SURGICAL HISTORY: Past Surgical History  Procedure Laterality Date  . Back surgery    . Back surgery  1992    MEDICATIONS AT HOME: Prior to Admission medications   Medication Sig Start Date End Date Taking? Authorizing Provider  ALPRAZolam Prudy Feeler(XANAX) 0.5 MG tablet Take 0.5 mg by mouth 3 (three) times daily as needed for anxiety.   Yes Historical Provider, MD  diphenhydrAMINE (BENADRYL) 25 MG tablet Take 25 mg by mouth every 6 (six) hours as needed.   Yes Historical Provider, MD  FLUoxetine  (PROZAC) 40 MG capsule Take 40 mg by mouth daily.   Yes Historical Provider, MD  ibuprofen (ADVIL,MOTRIN) 200 MG tablet Take 400 mg by mouth every 6 (six) hours as needed.   Yes Historical Provider, MD    FAMILY HISTORY: Family History  Problem Relation Age of Onset  . Cancer Mother   . Cancer Father   . Cancer Sister     SOCIAL HISTORY:  reports that she has never smoked. She does not have any smokeless tobacco history on file. She reports that she drinks about 3.6 oz of alcohol per week. She reports that she does not use illicit drugs. Lives at: Home Mobility: Independent  REVIEW OF SYSTEMS:  Constitutional:   No  weight loss, night sweats,  Fevers, chills, fatigue.  HEENT:    No headaches, Dysphagia,Tooth/dental problems,Sore throat,  No sneezing, itching, ear ache, nasal congestion, post nasal drip  Cardio-vascular: No Orthopnea, PND,lower extremity edema, anasarca, palpitations  GI:  No heartburn, indigestion, abdominal pain, nausea, vomiting, diarrhea, melena or hematochezia  Resp: No shortness of breath, cough, hemoptysis   Skin:  No rash or lesions.  GU:  No dysuria, change in color of urine, no urgency or frequency.  No flank pain.  Musculoskeletal: No joint pain or swelling.  No decreased range of motion.  No back pain.  Endocrine: No heat intolerance, no cold intolerance, no polyuria, no polydipsia  Psych: No change in mood or affect. No depression or anxiety.  No memory loss.   PHYSICAL EXAM: Blood pressure 113/78, pulse 61, temperature 97.7  F (36.5 C), temperature source Oral, resp. rate 17, height  (1.676 m), weight 66.679 kg (147 lb), SpO2 99 %.  General appearance :Awake, alert, not in any distress. Speech Clear. Not toxic Looking HEENT: Atraumatic and Normocephalic, pupils equally reactive to light and accomodation Neck: supple, no JVD. No cervical lymphadenopathy.  Chest:Good air entry bilaterally, no added sounds. Pain is easily  reproducible in the lower lateral part of her chest-in fact her lower precordial area is exquisitely tender to gentle palpation, pain is also easily reproducible with abduction and abduction of her shoulders. CVS: S1 S2 regular, no murmurs.  Abdomen: Bowel sounds present, Non tender and not distended with no gaurding, rigidity or rebound. Extremities: B/L Lower Ext shows no edema, both legs are warm to touch Neurology:  Non focal Skin:No Rash Wounds:N/A  LABS ON ADMISSION:   Recent Labs  11/02/15 1304  NA 140  K 3.0*  CL 108  CO2 22  GLUCOSE 135*  BUN 17  CREATININE 0.69  CALCIUM 9.0    Recent Labs  11/02/15 1304  AST 21  ALT 14  ALKPHOS 60  BILITOT 0.8  PROT 5.9*  ALBUMIN 3.7    Recent Labs  11/02/15 1304  LIPASE 27    Recent Labs  11/02/15 1304  WBC 6.8  HGB 14.8  HCT 43.4  MCV 91.9  PLT 196   No results for input(s): CKTOTAL, CKMB, CKMBINDEX, TROPONINI in the last 72 hours.  Recent Labs  11/02/15 1304  DDIMER <0.27   Invalid input(s): POCBNP   RADIOLOGIC STUDIES ON ADMISSION: Dg Chest 2 View  11/02/2015  CLINICAL DATA:  Left shoulder and chest pain. Shortness of breath for 2 weeks. EXAM: CHEST  2 VIEW COMPARISON:  None. FINDINGS: The heart size and mediastinal contours are within normal limits. Both lungs are clear. The visualized skeletal structures are unremarkable. IMPRESSION: No active cardiopulmonary disease. Electronically Signed   By: Signa Kell M.D.   On: 11/02/2015 13:15   I have personally reviewed images of chest xray   EKG: Personally reviewed. Normal sinus rhythm  ASSESSMENT AND PLAN: Present on Admission:  . Chest pain: Mostly with atypical features-easily reproducible with palpation and movement-highly suspicious for musculoskeletal etiology. EKG and cardiac enzymes negative so far. D-dimer negative as well-given lack of hypoxia and will admit for observation and monitor patient's clinical course. Trial of Toradol 3 doses,  along with other supportive measures. Check echo in the morning to assess for wall motion abnormality, if pain persists, will get cardiology input.   Marland Kitchen Anxiety: Continue with as needed Xanax and fluoxetine. Suspect anxiety contributing to pain/discomfort.  Further plan will depend as patient's clinical course evolves and further radiologic and laboratory data become available. Patient will be monitored closely.  Above noted plan was discussed with patient/daughter face to face at bedside, they were in agreement.   CONSULTS: None  DVT Prophylaxis: Prophylactic Lovenox   Code Status: Full Code  Disposition Plan:  Discharge back home possibly in 1 day,but may warrant SNF depending on clinical course  Total time spent  35 minutes.Greater than 50% of this time was spent in counseling, explanation of diagnosis, planning of further management, and coordination of care.  [facility] Pager [phone]  If 7PM-7AM, please contact night-coverage [email or web address] Password [email or web address] 11/02/2015, 6:20 PM

## 2015-11-02 NOTE — ED Notes (Signed)
PA at bedside.

## 2015-11-03 ENCOUNTER — Observation Stay (HOSPITAL_BASED_OUTPATIENT_CLINIC_OR_DEPARTMENT_OTHER): Payer: BC Managed Care – PPO

## 2015-11-03 ENCOUNTER — Encounter (HOSPITAL_COMMUNITY): Payer: Self-pay | Admitting: General Practice

## 2015-11-03 DIAGNOSIS — R072 Precordial pain: Secondary | ICD-10-CM

## 2015-11-03 DIAGNOSIS — F419 Anxiety disorder, unspecified: Secondary | ICD-10-CM

## 2015-11-03 DIAGNOSIS — R079 Chest pain, unspecified: Secondary | ICD-10-CM

## 2015-11-03 DIAGNOSIS — R011 Cardiac murmur, unspecified: Secondary | ICD-10-CM | POA: Diagnosis not present

## 2015-11-03 DIAGNOSIS — R1032 Left lower quadrant pain: Secondary | ICD-10-CM | POA: Diagnosis not present

## 2015-11-03 DIAGNOSIS — R1012 Left upper quadrant pain: Secondary | ICD-10-CM | POA: Diagnosis not present

## 2015-11-03 LAB — TROPONIN I: Troponin I: <0.03 ng/mL (ref <0.031)

## 2015-11-03 MED ORDER — PANTOPRAZOLE SODIUM 40 MG PO TBEC: 40.0000 mg | DELAYED_RELEASE_TABLET | Freq: Every day | ORAL | Status: DC

## 2015-11-03 NOTE — Consult Note (Addendum)
Patient Name: Becky Murphy Date of Encounter: 11/03/2015    Reason for Consult: Chest pain  Requesting Physician: Ghimire  Cardiologist: None   SUBJECTIVE Physically active 64 year old without known coronary risk factors presents with chest pressure radiating to left shoulder after lifting heavy cement blocks. Symptoms intermittent for 2-3 weeks, recently worse. The pain is worsened by movement, not by walking or exertion. No other CV symptoms, no fever or chills. Pain improved, some chest and shoulder discomfort persists. Symptoms worsen with movement and direct pressure on chest, not with breathing or cough. She was told 6 months ago that she has a murmur when seen in Urgent Care for a tick bite. She did not follow up.  PMHx:  Past Medical History  Diagnosis Date  . Anxiety   . Depression   . History of peptic ulcer "late 1970's"  . Arthritis     "back?" (11/03/2015)  . Migraine     "maybe 3-4/yr now" (11/03/2015)   Past Surgical History  Procedure Laterality Date  . Back surgery    . Breast biopsy Left ~ 2005  . Breast lumpectomy Left ~ 2005  . Lumbar disc surgery  1992    "trimmed bulges off both sides"  . Tubal ligation    . Eye muscle surgery Bilateral 1970's?    FAMHx: Family History  Problem Relation Age of Onset  . Cancer Mother   . Cancer Father   . Cancer Sister     SOCHx:  reports that she has never smoked. She has never used smokeless tobacco. She reports that she drinks about 6.0 oz of alcohol per week. She reports that she uses illicit drugs.  ALLERGIES: No Known Allergies  CURRENT MEDS . aspirin EC  325 mg Oral Daily  . enoxaparin (LOVENOX) injection  40 mg Subcutaneous Q24H  . FLUoxetine  40 mg Oral Daily  . ketorolac  30 mg Intravenous 3 times per day  . pantoprazole  40 mg Oral Q1200   REVIEW OF SYSTEMS:  Constitutional:  No weight loss, night sweats, Fevers, chills, fatigue.  HEENT:  No headaches,  Dysphagia,Tooth/dental problems,Sore throat,  No sneezing, itching, ear ache, nasal congestion, post nasal drip  Cardio-vascular: No Orthopnea, PND,lower extremity edema, anasarca, palpitations  GI:  No heartburn, indigestion, abdominal pain, nausea, vomiting, diarrhea, melena or hematochezia  Resp: No shortness of breath, cough, hemoptysis   Skin:  No rash or lesions.  GU:  No dysuria, change in color of urine, no urgency or frequency. No flank pain.  Musculoskeletal: No joint pain or swelling. No decreased range of motion. No back pain.  Endocrine: No heat intolerance, no cold intolerance, no polyuria, no polydipsia  Psych: No change in mood or affect. No depression or anxiety. No memory loss.  OBJECTIVE   Intake/Output Summary (Last 24 hours) at 11/03/15 0939 Last data filed at 11/03/15 0100  Gross per 24 hour  Intake    615 ml  Output      0 ml  Net    615 ml   Filed Weights   11/02/15 1146 11/02/15 1850  Weight: 147 lb (66.679 kg) 138 lb 12.8 oz (62.959 kg)    PHYSICAL EXAM Filed Vitals:   11/02/15 2059 11/03/15 0004 11/03/15 0448 11/03/15 0917  BP: 131/74 113/67 115/75 116/73  Pulse: 66 63 64 65  Temp: 98.3 F (36.8 C) 98 F (36.7 C) 98 F (36.7 C) 98.4 F (36.9 C)  TempSrc: Oral Oral Oral Oral  Resp: 16  16 16   Height:      Weight:      SpO2: 95% 95% 95% 94%   General: Alert, oriented x3, no distress Head: no evidence of trauma, PERRL, EOMI, no exophtalmos or lid lag, no myxedema, no xanthelasma; normal ears, nose and oropharynx Neck: normal jugular venous pulsations and no hepatojugular reflux; brisk carotid pulses without delay and no carotid bruits Chest: clear to auscultation, no signs of consolidation by percussion or palpation, normal fremitus, symmetrical and full respiratory excursions Cardiovascular: normal position and quality of the apical impulse, regular rhythm, normal first and second heart sounds, no rubs or gallops, 3/6 late  peaking holosystolic murmur heard best at the LLSB, less in the apical area, not radiating to axilla or aortic focus, not changed with Valsalva maneuver, no diastolic murmur Abdomen: no tenderness or distention, no masses by palpation, no abnormal pulsatility or arterial bruits, normal bowel sounds, no hepatosplenomegaly Extremities: no clubbing, cyanosis or edema; 2+ radial, ulnar and brachial pulses bilaterally; 2+ right femoral, posterior tibial and dorsalis pedis pulses; 2+ left femoral, posterior tibial and dorsalis pedis pulses; no subclavian or femoral bruits Neurological: grossly nonfocal  LABS  CBC  Recent Labs  11/02/15 1304  WBC 6.8  HGB 14.8  HCT 43.4  MCV 91.9  PLT 196   Basic Metabolic Panel  Recent Labs  11/02/15 1304  NA 140  K 3.0*  CL 108  CO2 22  GLUCOSE 135*  BUN 17  CREATININE 0.69  CALCIUM 9.0   Liver Function Tests  Recent Labs  11/02/15 1304  AST 21  ALT 14  ALKPHOS 60  BILITOT 0.8  PROT 5.9*  ALBUMIN 3.7    Recent Labs  11/02/15 1304  LIPASE 27   Cardiac Enzymes  Recent Labs  11/02/15 1947 11/03/15 0006  TROPONINI <0.03 <0.03   BNP Invalid input(s): POCBNP D-Dimer  Recent Labs  11/02/15 1304  DDIMER <0.27   Hemoglobin A1C No results for input(s): HGBA1C in the last 72 hours. Fasting Lipid Panel No results for input(s): CHOL, HDL, LDLCALC, TRIG, CHOLHDL, LDLDIRECT in the last 72 hours. Thyroid Function Tests No results for input(s): TSH, T4TOTAL, T3FREE, THYROIDAB in the last 72 hours.  Invalid input(s): FREET3  Radiology Studies Imaging results have been reviewed and Dg Chest 2 View  11/02/2015  CLINICAL DATA:  Left shoulder and chest pain. Shortness of breath for 2 weeks. EXAM: CHEST  2 VIEW COMPARISON:  None. FINDINGS: The heart size and mediastinal contours are within normal limits. Both lungs are clear. The visualized skeletal structures are unremarkable. IMPRESSION: No active cardiopulmonary disease.  Electronically Signed   By: Signa Kell M.D.   On: 11/02/2015 13:15    TELE NSR  ECG NSR, no LVH, no repol changes  ASSESSMENT AND PLAN  1. Chest pain is clearly musculoskeletal. Low coronary risk profile, no high risk features for ACS. 2. Murmur is quite loud, but is reportedly not new. Might represent mitral regurgitation due to posterior leaflet prolapse or tricuspid insufficiency. Echo pending. If echo shows benign findings, further w/u as an outpatient is appropriate.   Thurmon Fair, MD, [facility] [phone] office [phone] pager 11/03/2015 9:39 AM

## 2015-11-03 NOTE — Progress Notes (Signed)
  Echocardiogram 2D Echocardiogram has been performed.  Becky SavoyCasey Murphy Becky Murphy 11/03/2015, 11:23 AM

## 2015-11-03 NOTE — Discharge Summary (Signed)
PATIENT DETAILS Name: Becky Murphy Age: 64 y.o. Sex: female Date of Birth: 07/23/1951 MRN: [id number]. Admitting Physician: Maretta Bees, MD WUJ:[id number],NWGNFA Joelene Millin, MD  Admit Date: 11/02/2015 Discharge date: 11/03/2015  Recommendations for Outpatient Follow-up:  1. Has mitral valve prolapse with regurgitation-will need her regular surveillance and monitoring. 2. Repeat chemistries at next visit  PRIMARY DISCHARGE DIAGNOSIS:  Principal Problem:   Chest pain Active Problems:   Anxiety      PAST MEDICAL HISTORY: Past Medical History  Diagnosis Date  . Anxiety   . Depression   . History of peptic ulcer "late 1970's"  . Arthritis     "back?" (11/03/2015)  . Migraine     "maybe 3-4/yr now" (11/03/2015)    DISCHARGE MEDICATIONS: Current Discharge Medication List    START taking these medications   Details  pantoprazole (PROTONIX) 40 MG tablet Take 1 tablet (40 mg total) by mouth daily. Qty: 30 tablet, Refills: 0      CONTINUE these medications which have NOT CHANGED   Details  ALPRAZolam (XANAX) 0.5 MG tablet Take 0.5 mg by mouth 3 (three) times daily as needed for anxiety.    diphenhydrAMINE (BENADRYL) 25 MG tablet Take 25 mg by mouth every 6 (six) hours as needed.    FLUoxetine (PROZAC) 40 MG capsule Take 40 mg by mouth daily.    ibuprofen (ADVIL,MOTRIN) 200 MG tablet Take 400 mg by mouth every 6 (six) hours as needed.        ALLERGIES:  No Known Allergies  BRIEF HPI:  See H&P, Labs, Consult and Test reports for all details in brief, patient was admitted for evaluation of chest pain  CONSULTATIONS:   cardiology  PERTINENT RADIOLOGIC STUDIES: Dg Chest 2 View  11/02/2015  CLINICAL DATA:  Left shoulder and chest pain. Shortness of breath for 2 weeks. EXAM: CHEST  2 VIEW COMPARISON:  None. FINDINGS: The heart size and mediastinal contours are within normal limits. Both lungs are clear. The visualized skeletal structures are unremarkable. IMPRESSION:  No active cardiopulmonary disease. Electronically Signed   By: Signa Kell M.D.   On: 11/02/2015 13:15     PERTINENT LAB RESULTS: CBC:  Recent Labs  11/02/15 1304  WBC 6.8  HGB 14.8  HCT 43.4  PLT 196   CMET CMP     Component Value Date/Time   NA 140 11/02/2015 1304   K 3.0* 11/02/2015 1304   CL 108 11/02/2015 1304   CO2 22 11/02/2015 1304   GLUCOSE 135* 11/02/2015 1304   BUN 17 11/02/2015 1304   CREATININE 0.69 11/02/2015 1304   CREATININE 0.62 05/15/2015 1017   CALCIUM 9.0 11/02/2015 1304   PROT 5.9* 11/02/2015 1304   ALBUMIN 3.7 11/02/2015 1304   AST 21 11/02/2015 1304   ALT 14 11/02/2015 1304   ALKPHOS 60 11/02/2015 1304   BILITOT 0.8 11/02/2015 1304   GFRNONAA >60 11/02/2015 1304   GFRNONAA >89 05/15/2015 1017   GFRAA >60 11/02/2015 1304   GFRAA >89 05/15/2015 1017    GFR Estimated Creatinine Clearance: 67.4 mL/min (by C-G formula based on Cr of 0.69).  Recent Labs  11/02/15 1304  LIPASE 27    Recent Labs  11/02/15 1947 11/03/15 0006  TROPONINI <0.03 <0.03   Invalid input(s): POCBNP  Recent Labs  11/02/15 1304  DDIMER <0.27   No results for input(s): HGBA1C in the last 72 hours. No results for input(s): CHOL, HDL, LDLCALC, TRIG, CHOLHDL, LDLDIRECT in the last 72 hours. No results for  input(s): TSH, T4TOTAL, T3FREE, THYROIDAB in the last 72 hours.  Invalid input(s): FREET3 No results for input(s): VITAMINB12, FOLATE, FERRITIN, TIBC, IRON, RETICCTPCT in the last 72 hours. Coags: No results for input(s): INR in the last 72 hours.  Invalid input(s): PT Microbiology: No results found for this or any previous visit (from the past 240 hour(s)).   BRIEF HOSPITAL COURSE:   Principal Problem: Chest pain: Easily reproducible with palpation and movement of the left arm-likely musculoskeletal.Cardiac enzymes were negative, echocardiogram did not show any wall motion abnormalities. Seen by cardiology-no further recommendations. Continues to have  some mild chest pain but significantly better than on admission. Patient encouraged to take over-the-counter nonsteroidal anti-inflammatory medications.  Active Problems: Mitral prolapse with regurgitation: Echocardiogram demonstrated mitral prolapse with regurgitation-Will need periodic echocardiograms-please refer patient to cardiology as an outpatient  Anxiety: Stable-continue with preadmission medications   ODAY-DAY OF DISCHARGE:  Subjective:   Teri Legacy today has no headache,no chest abdominal pain,no new weakness tingling or numbness, feels much better wants to go home today.   Objective:   Blood pressure 116/73, pulse 65, temperature 98.4 F (36.9 C), temperature source Oral, resp. rate 16, height  (1.676 m), weight 62.959 kg (138 lb 12.8 oz), SpO2 94 %.  Intake/Output Summary (Last 24 hours) at 11/03/15 1310 Last data filed at 11/03/15 0100  Gross per 24 hour  Intake    615 ml  Output      0 ml  Net    615 ml   Filed Weights   11/02/15 1146 11/02/15 1850  Weight: 66.679 kg (147 lb) 62.959 kg (138 lb 12.8 oz)    Exam Awake Alert, Oriented *3, No new F.N deficits, Normal affect Platinum.AT,PERRAL Supple Neck,No JVD, No cervical lymphadenopathy appriciated.  Symmetrical Chest wall movement, Good air movement bilaterally, CTAB RRR,No Gallops,Rubs or new Murmurs, No Parasternal Heave +ve B.Sounds, Abd Soft, Non tender, No organomegaly appriciated, No rebound -guarding or rigidity. No Cyanosis, Clubbing or edema, No new Rash or bruise  DISCHARGE CONDITION: Stable  DISPOSITION: Home  DISCHARGE INSTRUCTIONS:    Activity:  As tolerated   Get Medicines reviewed and adjusted: Please take all your medications with you for your next visit with your Primary MD  Please request your Primary MD to go over all hospital tests and procedure/radiological results at the follow up, please ask your Primary MD to get all Hospital records sent to his/her office.  If you  experience worsening of your admission symptoms, develop shortness of breath, life threatening emergency, suicidal or homicidal thoughts you must seek medical attention immediately by calling 911 or calling your MD immediately  if symptoms less severe.  You must read complete instructions/literature along with all the possible adverse reactions/side effects for all the Medicines you take and that have been prescribed to you. Take any new Medicines after you have completely understood and accpet all the possible adverse reactions/side effects.   Do not drive when taking Pain medications.   Do not take more than prescribed Pain, Sleep and Anxiety Medications  Special Instructions: If you have smoked or chewed Tobacco  in the last 2 yrs please stop smoking, stop any regular Alcohol  and or any Recreational drug use.  Wear Seat belts while driving.  Please note  You were cared for by a hospitalist during your hospital stay. Once you are discharged, your primary care physician will handle any further medical issues. Please note that NO REFILLS for any discharge medications will be authorized once  you are discharged, as it is imperative that you return to your primary care physician (or establish a relationship with a primary care physician if you do not have one) for your aftercare needs so that they can reassess your need for medications and monitor your lab values.   Diet recommendation: Regular diet  Discharge Instructions    Call MD for:  persistant nausea and vomiting    Complete by:  As directed      Call MD for:  severe uncontrolled pain    Complete by:  As directed      Diet general    Complete by:  As directed      Increase activity slowly    Complete by:  As directed            Follow-up Information    Follow up with Your Primary Care Doctor. Schedule an appointment as soon as possible for a visit in 1 week.   Why:  For potassium recheck      Follow up with CROITORU,MIHAI,  MD. Schedule an appointment as soon as possible for a visit in 2 months.   Specialty:  Cardiology   Why:  Hospital follow up   Contact information:   [address] [place] [address] [phone]       Total Time spent on discharge equals 25  minutes.  SignedJeoffrey Massed: Karyna Bessler 11/03/2015 1:10 PM

## 2015-12-26 DIAGNOSIS — I341 Nonrheumatic mitral (valve) prolapse: Secondary | ICD-10-CM

## 2015-12-26 HISTORY — DX: Nonrheumatic mitral (valve) prolapse: I34.1

## 2015-12-26 NOTE — Progress Notes (Signed)
Patient ID: Becky RunningSandra H Julson, female   DOB: [birth year], 64 y.o.   MRN: [id number]     Cardiology Office Note    Date:  01/05/2016   ID:  Becky Murphy, DOB [birth year], MRN [id number]  PCP:  Kaleen MaskELKINS,WILSON OLIVER, MD  Cardiologist:   Thurmon FairROITORU,Iyani Dresner, MD   Chief Complaint  Patient presents with  . Follow-up    has chest pain, has shortness of breath, no edema, has pain in legs, has cramping in legs, has lightheadedness and dizziness    History of Present Illness:  Becky RunningSandra H Sanko is a 64 y.o. female recently hospitalized with chest pain that was felt to be musculoskeletal in etiology, during which hospitalization a murmur was identified. She has mitral valve prolapse of the posterior leaflet with asymptomatic mild to moderate mitral valve prolapse. The left ventricle showed normal size, systolic and diastolic function.  Since discharge she continues to describe exertional dyspnea NYHA class 2 and constant chest pressure, worse when lying down on right or left side, less severe when lying on back or sitting up. No true orthopnea. She recalls relatively abrupt onset of dyspnea several months ago. Also denies palpitations, syncope, presyncope, leg edema, claudication or focal neurological complaints.  Past Medical History  Diagnosis Date  . Anxiety   . Depression   . History of peptic ulcer "late 1970's"  . Arthritis     "back?" (11/03/2015)  . Migraine     "maybe 3-4/yr now" (11/03/2015)    Past Surgical History  Procedure Laterality Date  . Back surgery    . Breast biopsy Left ~ 2005  . Breast lumpectomy Left ~ 2005  . Lumbar disc surgery  1992    "trimmed bulges off both sides"  . Tubal ligation    . Eye muscle surgery Bilateral 1970's?    No current facility-administered medications for this visit.   No current outpatient prescriptions on file.   Facility-Administered Medications Ordered in Other Visits  Medication Dose Route Frequency Provider Last Rate Last Dose  . 0.9 %   sodium chloride infusion   Intravenous Continuous Neko Boyajian, MD        Allergies:   Review of patient's allergies indicates no known allergies.   Social History   Social History  . Marital Status: Married    Spouse Name: N/A  . Number of Children: N/A  . Years of Education: N/A   Social History Main Topics  . Smoking status: Never Smoker   . Smokeless tobacco: Never Used  . Alcohol Use: 6.0 oz/week    6 Glasses of wine, 4 Shots of liquor per week  . Drug Use: No  . Sexual Activity: Not Currently   Other Topics Concern  . None   Social History Narrative     Family History:  The patient's family history includes Cancer in her father, mother, and sister.   ROS:   Please see the history of present illness.    Review of Systems  All other systems reviewed and are negative.  All other systems reviewed and are negative.   PHYSICAL EXAM:   VS:  BP 118/82 mmHg  Pulse 54  Ht 5\' 6"  (1.676 m)  Wt 141 lb 5 oz (64.099 kg)  BMI 22.82 kg/m2   GEN: Well nourished, well developed, in no acute distress HEENT: normal Neck: no JVD, carotid bruits, or masses Cardiac: RRR; no rubs, or gallops,no edema. 3/6 late peaking holosystolic murmur heard best at the LLSB, less in the apical area,  not radiating to axilla or aortic focus Respiratory:  clear to auscultation bilaterally, normal work of breathing GI: soft, nontender, nondistended, + BS MS: no deformity or atrophy Skin: warm and dry, no rash Neuro:  Alert and Oriented x 3, Strength and sensation are intact Psych: euthymic mood, full affect  Wt Readings from Last 3 Encounters:  01/03/16 141 lb 5 oz (64.099 kg)  11/02/15 138 lb 12.8 oz (62.959 kg)  05/15/15 142 lb 9.6 oz (64.683 kg)      Studies/Labs Reviewed:   EKG:  EKG is ordered today.  The ekg ordered today demonstrates sinus bradycardia, otherwise normal tracing  Recent Labs: 11/02/2015: ALT 14; BUN 17; Creatinine, Ser 0.69; Hemoglobin 14.8; Platelets 196;  Potassium 3.0*; Sodium 140   Lipid Panel No results found for: CHOL, TRIG, HDL, CHOLHDL, VLDL, LDLCALC, LDLDIRECT    ASSESSMENT:    1.   1. Mitral valve prolapse with mild-moderate mitral insufficiency   2. Exertional dyspnea   3. Precordial pain      PLAN:  In order of problems listed above:  1. Unclear whether her dyspnea has anything to do with her valve disease. Suggested that she have a transesophageal echocardiogram to make sure we are not underestimating the severity of mitral regurgitation, especially if she has an eccentric jet. Discussed the potential long term risk of worsening MR and small risk of infective endocarditis, need for surgery, arrhythmia (note mild left atrial dilation on echo). The TEE procedure and moderate sedation were fully reviewed with the patient and informed consent was obtained.  2. As above. If MR does not appear to be severe, consider evaluation for pulmonary disease  3. This would be highly atypical for angina pectoralis and does not appear to be worsened by physical activity, but rather is positional. It does appear to be most likely musculoskeletal. It does not have the typical pleuritic quality of pericarditis or pleuritis. She is at low risk for coronary artery disease based on her risk factor profile.   Medication Adjustments/Labs and Tests Ordered: Current medicines are reviewed at length with the patient today.  Concerns regarding medicines are outlined above.  Medication changes, Labs and Tests ordered today are listed below. Patient Instructions  Your physician has requested that you have a TEE Friday afternoon, 01/05/16, with Dr Royann Shivers. During a TEE, sound waves are used to create images of your heart. It provides your doctor with information about the size and shape of your heart and how well your heart's chambers and valves are working. In this test, a transducer is attached to the end of a flexible tube that's guided down your throat and  into your esophagus (the tube leading from you mouth to your stomach) to get a more detailed image of your heart. You are not awake for the procedure. Please see the instruction sheet given to you today. For further information please visit [email or web address]        Joie Bimler, MD  01/05/2016 12:44 PM    [facility] [address] [place], [place], [place]  [address] Phone: [phone]; Fax: [phone]

## 2016-01-03 ENCOUNTER — Ambulatory Visit (INDEPENDENT_AMBULATORY_CARE_PROVIDER_SITE_OTHER): Payer: BC Managed Care – PPO | Admitting: Cardiovascular Disease

## 2016-01-03 ENCOUNTER — Encounter: Payer: Self-pay | Admitting: Cardiovascular Disease

## 2016-01-03 VITALS — BP 118/82 | HR 54 | Ht 66.0 in | Wt 141.3 lb

## 2016-01-03 DIAGNOSIS — I340 Nonrheumatic mitral (valve) insufficiency: Secondary | ICD-10-CM | POA: Diagnosis not present

## 2016-01-03 DIAGNOSIS — R0609 Other forms of dyspnea: Secondary | ICD-10-CM | POA: Diagnosis not present

## 2016-01-03 DIAGNOSIS — I341 Nonrheumatic mitral (valve) prolapse: Secondary | ICD-10-CM | POA: Diagnosis not present

## 2016-01-03 DIAGNOSIS — R072 Precordial pain: Secondary | ICD-10-CM | POA: Diagnosis not present

## 2016-01-03 NOTE — Patient Instructions (Signed)
Your physician has requested that you have a TEE Friday afternoon, 01/05/16, with Dr Royann Shiversroitoru. During a TEE, sound waves are used to create images of your heart. It provides your doctor with information about the size and shape of your heart and how well your heart's chambers and valves are working. In this test, a transducer is attached to the end of a flexible tube that's guided down your throat and into your esophagus (the tube leading from you mouth to your stomach) to get a more detailed image of your heart. You are not awake for the procedure. Please see the instruction sheet given to you today. For further information please visit [email or web address].

## 2016-01-05 ENCOUNTER — Ambulatory Visit (HOSPITAL_COMMUNITY)
Admission: RE | Admit: 2016-01-05 | Discharge: 2016-01-05 | Disposition: A | Discharge status: Home / Self Care | Payer: BC Managed Care – PPO | Source: Ambulatory Visit | Attending: Cardiovascular Disease | Admitting: Cardiovascular Disease

## 2016-01-05 ENCOUNTER — Encounter (HOSPITAL_COMMUNITY): Payer: Self-pay | Admitting: *Deleted

## 2016-01-05 ENCOUNTER — Ambulatory Visit (HOSPITAL_BASED_OUTPATIENT_CLINIC_OR_DEPARTMENT_OTHER)
Admission: RE | Admit: 2016-01-05 | Discharge: 2016-01-05 | Disposition: A | Discharge status: Home / Self Care | Payer: BC Managed Care – PPO | Source: Ambulatory Visit | Attending: Cardiovascular Disease | Admitting: Cardiovascular Disease

## 2016-01-05 ENCOUNTER — Encounter (HOSPITAL_COMMUNITY)
Admission: RE | Disposition: A | Discharge status: Home / Self Care | Payer: Self-pay | Source: Ambulatory Visit | Attending: Cardiovascular Disease

## 2016-01-05 DIAGNOSIS — I341 Nonrheumatic mitral (valve) prolapse: Secondary | ICD-10-CM | POA: Insufficient documentation

## 2016-01-05 DIAGNOSIS — I340 Nonrheumatic mitral (valve) insufficiency: Secondary | ICD-10-CM | POA: Insufficient documentation

## 2016-01-05 DIAGNOSIS — R0609 Other forms of dyspnea: Secondary | ICD-10-CM

## 2016-01-05 DIAGNOSIS — R0600 Dyspnea, unspecified: Secondary | ICD-10-CM

## 2016-01-05 HISTORY — DX: Nonrheumatic mitral (valve) prolapse: I34.1

## 2016-01-05 HISTORY — DX: Dyspnea, unspecified: R06.00

## 2016-01-05 HISTORY — PX: TEE WITHOUT CARDIOVERSION: SHX5443

## 2016-01-05 HISTORY — DX: Nonrheumatic mitral (valve) insufficiency: I34.0

## 2016-01-05 HISTORY — DX: Other forms of dyspnea: R06.09

## 2016-01-05 SURGERY — ECHOCARDIOGRAM, TRANSESOPHAGEAL
Anesthesia: Moderate Sedation

## 2016-01-05 MED ORDER — MIDAZOLAM HCL 5 MG/ML IJ SOLN
INTRAMUSCULAR | Status: AC
  Filled 2016-01-05: qty 2

## 2016-01-05 MED ORDER — SODIUM CHLORIDE 0.9 % IV SOLN: INTRAVENOUS | Status: DC

## 2016-01-05 MED ORDER — FENTANYL CITRATE (PF) 100 MCG/2ML IJ SOLN
INTRAMUSCULAR | Status: AC
  Filled 2016-01-05: qty 2

## 2016-01-05 MED ORDER — MIDAZOLAM HCL 10 MG/2ML IJ SOLN
INTRAMUSCULAR | Status: DC | PRN
  Administered 2016-01-05 (×2): 2 mg via INTRAVENOUS

## 2016-01-05 MED ORDER — FENTANYL CITRATE (PF) 100 MCG/2ML IJ SOLN
INTRAMUSCULAR | Status: DC | PRN
  Administered 2016-01-05 (×2): 25 ug via INTRAVENOUS

## 2016-01-05 MED ORDER — BUTAMBEN-TETRACAINE-BENZOCAINE 2-2-14 % EX AERO
INHALATION_SPRAY | CUTANEOUS | Status: DC | PRN
  Administered 2016-01-05: 2 via TOPICAL

## 2016-01-05 NOTE — Op Note (Signed)
INDICATIONS: mitral insufficiency  PROCEDURE:   Informed consent was obtained prior to the procedure. The risks, benefits and alternatives for the procedure were discussed and the patient comprehended these risks.  Risks include, but are not limited to, cough, sore throat, vomiting, nausea, somnolence, esophageal and stomach trauma or perforation, bleeding, low blood pressure, aspiration, pneumonia, infection, trauma to the teeth and death.    After a procedural time-out, the oropharynx was anesthetized with 20% benzocaine spray. The patient was given 4 mg versed and 50 mcg fentanyl for moderate sedation.   The transesophageal probe was inserted in the esophagus and stomach without difficulty and multiple views were obtained.  The patient was kept under observation until the patient left the procedure room.  The patient left the procedure room in stable condition.   Agitated microbubble saline contrast was not administered.  COMPLICATIONS:    There were no immediate complications.  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

## 2016-01-05 NOTE — Progress Notes (Signed)
Echocardiogram Echocardiogram Transesophageal has been performed.  Dorothey BasemanReel, Hayven Fatima M 01/05/2016, 2:05 PM

## 2016-01-05 NOTE — Op Note (Signed)
Discussed findings of TEE with Becky Murphy and husband. I recommended right and left heart cath as part of workup for possible mitral valve repair for severe MR. Discussed risks and benefits and she agrees to proceed.  Plan referral to Dr. Cornelius Moraswen for minimally invasive repair afterwards.

## 2016-01-05 NOTE — Discharge Instructions (Signed)

## 2016-01-05 NOTE — H&P (Signed)
Patient ID: Becky Murphy, female DOB: [birth year], 65 y.o. MRN: [id number]     Cardiology Office Note    Date: 01/05/2016   ID: Becky Murphy, DOB 1951-04-21, MRN [id number]  PCP: Kaleen Mask, MD Cardiologist: Thurmon Fair, MD   Chief Complaint  Patient presents with  . Follow-up    has chest pain, has shortness of breath, no edema, has pain in legs, has cramping in legs, has lightheadedness and dizziness    History of Present Illness:  Becky Murphy is a 65 y.o. female recently hospitalized with chest pain that was felt to be musculoskeletal in etiology, during which hospitalization a murmur was identified. She has mitral valve prolapse of the posterior leaflet with asymptomatic mild to moderate mitral valve prolapse. The left ventricle showed normal size, systolic and diastolic function.  Since discharge she continues to describe exertional dyspnea NYHA class 2 and constant chest pressure, worse when lying down on right or left side, less severe when lying on back or sitting up. No true orthopnea. She recalls relatively abrupt onset of dyspnea several months ago. Also denies palpitations, syncope, presyncope, leg edema, claudication or focal neurological complaints.  Past Medical History  Diagnosis Date  . Anxiety   . Depression   . History of peptic ulcer "late 1970's"  . Arthritis     "back?" (11/03/2015)  . Migraine     "maybe 3-4/yr now" (11/03/2015)    Past Surgical History  Procedure Laterality Date  . Back surgery    . Breast biopsy Left ~ 2005  . Breast lumpectomy Left ~ 2005  . Lumbar disc surgery  1992    "trimmed bulges off both sides"  . Tubal ligation    . Eye muscle surgery Bilateral 1970's?    No current facility-administered medications for this visit.   No current outpatient prescriptions on file.   Facility-Administered Medications Ordered  in Other Visits  Medication Dose Route Frequency Provider Last Rate Last Dose  . 0.9 % sodium chloride infusion  Intravenous Continuous Timya Trimmer, MD      Allergies: Review of patient's allergies indicates no known allergies.   Social History   Social History  . Marital Status: Married    Spouse Name: N/A  . Number of Children: N/A  . Years of Education: N/A   Social History Main Topics  . Smoking status: Never Smoker   . Smokeless tobacco: Never Used  . Alcohol Use: 6.0 oz/week    6 Glasses of wine, 4 Shots of liquor per week  . Drug Use: No  . Sexual Activity: Not Currently   Other Topics Concern  . None   Social History Narrative     Family History: The patient's family history includes Cancer in her father, mother, and sister.   ROS:  Please see the history of present illness.  Review of Systems  All other systems reviewed and are negative.  All other systems reviewed and are negative.   PHYSICAL EXAM:   VS: BP 118/82 mmHg  Pulse 54  Ht  (1.676 m)  Wt 141 lb 5 oz (64.099 kg)  BMI 22.82 kg/m2  GEN: Well nourished, well developed, in no acute distress  HEENT: normal  Neck: no JVD, carotid bruits, or masses Cardiac: RRR; no rubs, or gallops,no edema. 3/6 late peaking holosystolic murmur heard best at the LLSB, less in the apical area, not radiating to axilla or aortic focus Respiratory: clear to auscultation bilaterally, normal work of breathing GI: soft, nontender, nondistended, +  BS MS: no deformity or atrophy  Skin: warm and dry, no rash Neuro: Alert and Oriented x 3, Strength and sensation are intact Psych: euthymic mood, full affect  Wt Readings from Last 3 Encounters:  01/03/16 141 lb 5 oz (64.099 kg)  11/02/15 138 lb 12.8 oz (62.959 kg)  05/15/15 142 lb 9.6 oz (64.683 kg)      Studies/Labs Reviewed:   EKG: EKG is ordered today. The ekg  ordered today demonstrates sinus bradycardia, otherwise normal tracing  Recent Labs: 11/02/2015: ALT 14; BUN 17; Creatinine, Ser 0.69; Hemoglobin 14.8; Platelets 196; Potassium 3.0*; Sodium 140   Lipid Panel  Labs (Brief)    No results found for: CHOL, TRIG, HDL, CHOLHDL, VLDL, LDLCALC, LDLDIRECT      ASSESSMENT:    1.   1. Mitral valve prolapse with mild-moderate mitral insufficiency   2. Exertional dyspnea   3. Precordial pain      PLAN:  In order of problems listed above:  1. Unclear whether her dyspnea has anything to do with her valve disease. Suggested that she have a transesophageal echocardiogram to make sure we are not underestimating the severity of mitral regurgitation, especially if she has an eccentric jet. Discussed the potential long term risk of worsening MR and small risk of infective endocarditis, need for surgery, arrhythmia (note mild left atrial dilation on echo). The TEE procedure and moderate sedation were fully reviewed with the patient and informed consent was obtained.  2. As above. If MR does not appear to be severe, consider evaluation for pulmonary disease  3. This would be highly atypical for angina pectoralis and does not appear to be worsened by physical activity, but rather is positional. It does appear to be most likely musculoskeletal. It does not have the typical pleuritic quality of pericarditis or pleuritis. She is at low risk for coronary artery disease based on her risk factor profile.   Medication Adjustments/Labs and Tests Ordered: Current medicines are reviewed at length with the patient today. Concerns regarding medicines are outlined above. Medication changes, Labs and Tests ordered today are listed below. Patient Instructions  Your physician has requested that you have a TEE Friday afternoon, 01/05/16, with Dr Royann Shiversroitoru. During a TEE, sound waves are used to create images of your heart. It provides your doctor with  information about the size and shape of your heart and how well your heart's chambers and valves are working. In this test, a transducer is attached to the end of a flexible tube that's guided down your throat and into your esophagus (the tube leading from you mouth to your stomach) to get a more detailed image of your heart. You are not awake for the procedure. Please see the instruction sheet given to you today. For further information please visit [email or web address].        Becky BimlerSigned, Jerrelle Michelsen, MD  01/05/2016 12:44 PM  [facility] [address], [place], [place] [address] Phone: [phone]; Fax: [phone]

## 2016-01-08 ENCOUNTER — Encounter (HOSPITAL_COMMUNITY): Payer: Self-pay | Admitting: Cardiovascular Disease

## 2016-01-09 ENCOUNTER — Other Ambulatory Visit: Payer: Self-pay | Admitting: *Deleted

## 2016-01-09 ENCOUNTER — Encounter: Payer: Self-pay | Admitting: Cardiovascular Disease

## 2016-01-09 ENCOUNTER — Telehealth: Payer: Self-pay | Admitting: *Deleted

## 2016-01-09 DIAGNOSIS — D689 Coagulation defect, unspecified: Secondary | ICD-10-CM

## 2016-01-09 DIAGNOSIS — Z79899 Other long term (current) drug therapy: Secondary | ICD-10-CM

## 2016-01-09 DIAGNOSIS — I340 Nonrheumatic mitral (valve) insufficiency: Secondary | ICD-10-CM

## 2016-01-09 NOTE — Telephone Encounter (Signed)
-----   Message from Silver Huguenineedie C Creed sent at 01/09/2016  9:40 AM EST ----- Britta MccreedyBarbara - this cath is scheduled for next Tuesday the 17th.  Can you please put in an order for labwork downstairs?  ----- Message -----    From: Lutricia HorsfallBillie D Robinson    Sent: 01/05/2016   2:29 PM      To: Sampson GoonShawnee I Trigloff, Deedie C Creed    ----- Message -----    From: Thurmon FairMihai Croitoru, MD    Sent: 01/05/2016   2:10 PM      To: Lutricia HorsfallBillie D Robinson  Please schedule right and left heart cath on a Tuesday afternoon of her preference. Re: mitral insufficiency

## 2016-01-09 NOTE — Telephone Encounter (Signed)
Order placed for pre-cath labs.

## 2016-01-10 LAB — COMPREHENSIVE METABOLIC PANEL
ALT: 10 U/L (ref 6–29)
AST: 12 U/L (ref 10–35)
Albumin: 4.5 g/dL (ref 3.6–5.1)
Alkaline Phosphatase: 63 U/L (ref 33–130)
BUN: 24 mg/dL (ref 7–25)
CHLORIDE: 100 mmol/L (ref 98–110)
CO2: 28 mmol/L (ref 20–31)
Calcium: 9.8 mg/dL (ref 8.6–10.4)
Creat: 0.65 mg/dL (ref 0.50–0.99)
GLUCOSE: 78 mg/dL (ref 65–99)
POTASSIUM: 4.4 mmol/L (ref 3.5–5.3)
Sodium: 139 mmol/L (ref 135–146)
Total Bilirubin: 0.8 mg/dL (ref 0.2–1.2)
Total Protein: 6.7 g/dL (ref 6.1–8.1)

## 2016-01-10 LAB — CBC
HCT: 49.1 % — ABNORMAL HIGH (ref 36.0–46.0)
Hemoglobin: 16.8 g/dL — ABNORMAL HIGH (ref 12.0–15.0)
MCH: 31.6 pg (ref 26.0–34.0)
MCHC: 34.2 g/dL (ref 30.0–36.0)
MCV: 92.5 fL (ref 78.0–100.0)
MPV: 10.6 fL (ref 8.6–12.4)
PLATELETS: 285 10*3/uL (ref 150–400)
RBC: 5.31 MIL/uL — ABNORMAL HIGH (ref 3.87–5.11)
RDW: 13.5 % (ref 11.5–15.5)
WBC: 9.4 10*3/uL (ref 4.0–10.5)

## 2016-01-10 LAB — PROTIME-INR
INR: 0.92 (ref <1.50)
PROTHROMBIN TIME: 12.5 s (ref 11.6–15.2)

## 2016-01-10 LAB — APTT: APTT: 29 s (ref 24–37)

## 2016-01-16 ENCOUNTER — Encounter (HOSPITAL_COMMUNITY)
Admission: RE | Disposition: A | Discharge status: Home / Self Care | Payer: BC Managed Care – PPO | Source: Ambulatory Visit | Attending: Cardiovascular Disease

## 2016-01-16 ENCOUNTER — Ambulatory Visit (HOSPITAL_COMMUNITY)
Admission: RE | Admit: 2016-01-16 | Discharge: 2016-01-16 | Disposition: A | Discharge status: Home / Self Care | Payer: BC Managed Care – PPO | Source: Ambulatory Visit | Attending: Cardiovascular Disease | Admitting: Cardiovascular Disease

## 2016-01-16 DIAGNOSIS — I340 Nonrheumatic mitral (valve) insufficiency: Secondary | ICD-10-CM | POA: Diagnosis not present

## 2016-01-16 DIAGNOSIS — Z8711 Personal history of peptic ulcer disease: Secondary | ICD-10-CM | POA: Insufficient documentation

## 2016-01-16 DIAGNOSIS — R0789 Other chest pain: Secondary | ICD-10-CM | POA: Insufficient documentation

## 2016-01-16 DIAGNOSIS — F329 Major depressive disorder, single episode, unspecified: Secondary | ICD-10-CM | POA: Diagnosis not present

## 2016-01-16 DIAGNOSIS — F419 Anxiety disorder, unspecified: Secondary | ICD-10-CM | POA: Diagnosis not present

## 2016-01-16 DIAGNOSIS — M1990 Unspecified osteoarthritis, unspecified site: Secondary | ICD-10-CM | POA: Diagnosis not present

## 2016-01-16 DIAGNOSIS — G43909 Migraine, unspecified, not intractable, without status migrainosus: Secondary | ICD-10-CM | POA: Diagnosis not present

## 2016-01-16 DIAGNOSIS — I341 Nonrheumatic mitral (valve) prolapse: Secondary | ICD-10-CM | POA: Diagnosis present

## 2016-01-16 HISTORY — PX: CARDIAC CATHETERIZATION: SHX172

## 2016-01-16 LAB — POCT I-STAT 3, ART BLOOD GAS (G3+)
ACID-BASE DEFICIT: 2 mmol/L (ref 0.0–2.0)
BICARBONATE: 23.4 meq/L (ref 20.0–24.0)
O2 SAT: 93 %
TCO2: 25 mmol/L (ref 0–100)
pCO2 arterial: 40.3 mmHg (ref 35.0–45.0)
pH, Arterial: 7.371 (ref 7.350–7.450)
pO2, Arterial: 69 mmHg — ABNORMAL LOW (ref 80.0–100.0)

## 2016-01-16 LAB — POCT I-STAT 3, VENOUS BLOOD GAS (G3P V)
ACID-BASE DEFICIT: 3 mmol/L — AB (ref 0.0–2.0)
Bicarbonate: 23 mEq/L (ref 20.0–24.0)
O2 SAT: 73 %
PCO2 VEN: 42.3 mmHg — AB (ref 45.0–50.0)
PO2 VEN: 41 mmHg (ref 30.0–45.0)
TCO2: 24 mmol/L (ref 0–100)
pH, Ven: 7.344 — ABNORMAL HIGH (ref 7.250–7.300)

## 2016-01-16 SURGERY — RIGHT/LEFT HEART CATH AND CORONARY ANGIOGRAPHY

## 2016-01-16 MED ORDER — HEPARIN SODIUM (PORCINE) 1000 UNIT/ML IJ SOLN
INTRAMUSCULAR | Status: DC | PRN
  Administered 2016-01-16: 3500 [IU] via INTRAVENOUS

## 2016-01-16 MED ORDER — MIDAZOLAM HCL 2 MG/2ML IJ SOLN
INTRAMUSCULAR | Status: AC
  Filled 2016-01-16: qty 2

## 2016-01-16 MED ORDER — SODIUM CHLORIDE 0.9 % WEIGHT BASED INFUSION
3.0000 mL/kg/h | INTRAVENOUS | Status: DC
  Administered 2016-01-16: 3 mL/kg/h via INTRAVENOUS

## 2016-01-16 MED ORDER — ACETAMINOPHEN 325 MG PO TABS: 650.0000 mg | ORAL_TABLET | ORAL | Status: DC | PRN

## 2016-01-16 MED ORDER — ASPIRIN 81 MG PO CHEW
CHEWABLE_TABLET | ORAL | Status: AC
  Administered 2016-01-16: 81 mg via ORAL
  Filled 2016-01-16: qty 1

## 2016-01-16 MED ORDER — ONDANSETRON HCL 4 MG/2ML IJ SOLN: 4.0000 mg | Freq: Four times a day (QID) | INTRAMUSCULAR | Status: DC | PRN

## 2016-01-16 MED ORDER — SODIUM CHLORIDE 0.9 % IJ SOLN: 3.0000 mL | INTRAMUSCULAR | Status: DC | PRN

## 2016-01-16 MED ORDER — VERAPAMIL HCL 2.5 MG/ML IV SOLN
INTRAVENOUS | Status: DC | PRN
  Administered 2016-01-16: 10 mL via INTRA_ARTERIAL

## 2016-01-16 MED ORDER — MIDAZOLAM HCL 2 MG/2ML IJ SOLN
INTRAMUSCULAR | Status: DC | PRN
  Administered 2016-01-16: 2 mg via INTRAVENOUS

## 2016-01-16 MED ORDER — VERAPAMIL HCL 2.5 MG/ML IV SOLN
INTRAVENOUS | Status: AC
  Filled 2016-01-16: qty 2

## 2016-01-16 MED ORDER — SODIUM CHLORIDE 0.9 % IJ SOLN: 3.0000 mL | Freq: Two times a day (BID) | INTRAMUSCULAR | Status: DC

## 2016-01-16 MED ORDER — HEPARIN (PORCINE) IN NACL 2-0.9 UNIT/ML-% IJ SOLN
INTRAMUSCULAR | Status: AC
  Filled 2016-01-16: qty 1000

## 2016-01-16 MED ORDER — NITROGLYCERIN 1 MG/10 ML FOR IR/CATH LAB
INTRA_ARTERIAL | Status: AC
  Filled 2016-01-16: qty 10

## 2016-01-16 MED ORDER — FENTANYL CITRATE (PF) 100 MCG/2ML IJ SOLN
INTRAMUSCULAR | Status: DC | PRN
  Administered 2016-01-16: 50 ug via INTRAVENOUS

## 2016-01-16 MED ORDER — ASPIRIN 81 MG PO CHEW
81.0000 mg | CHEWABLE_TABLET | ORAL | Status: AC
  Administered 2016-01-16: 81 mg via ORAL

## 2016-01-16 MED ORDER — SODIUM CHLORIDE 0.9 % IV SOLN: 250.0000 mL | INTRAVENOUS | Status: DC | PRN

## 2016-01-16 MED ORDER — LIDOCAINE HCL (PF) 1 % IJ SOLN
INTRAMUSCULAR | Status: AC
  Filled 2016-01-16: qty 30

## 2016-01-16 MED ORDER — SODIUM CHLORIDE 0.9 % WEIGHT BASED INFUSION: 3.0000 mL/kg/h | INTRAVENOUS | Status: DC

## 2016-01-16 MED ORDER — LIDOCAINE HCL (PF) 1 % IJ SOLN
INTRAMUSCULAR | Status: DC | PRN
  Administered 2016-01-16: 2 mL via INTRADERMAL

## 2016-01-16 MED ORDER — FENTANYL CITRATE (PF) 100 MCG/2ML IJ SOLN
INTRAMUSCULAR | Status: AC
  Filled 2016-01-16: qty 2

## 2016-01-16 MED ORDER — SODIUM CHLORIDE 0.9 % WEIGHT BASED INFUSION
1.0000 mL/kg/h | INTRAVENOUS | Status: DC
  Administered 2016-01-16: 1 mL/kg/h via INTRAVENOUS

## 2016-01-16 MED ORDER — HEPARIN SODIUM (PORCINE) 1000 UNIT/ML IJ SOLN
INTRAMUSCULAR | Status: AC
  Filled 2016-01-16: qty 1

## 2016-01-16 SURGICAL SUPPLY — 13 items
CATH BALLN WEDGE 5F 110CM (CATHETERS) ×3 IMPLANT
CATH INFINITI 5FR ANG PIGTAIL (CATHETERS) ×3 IMPLANT
CATH OPTITORQUE TIG 4.0 5F (CATHETERS) ×3 IMPLANT
DEVICE RAD COMP TR BAND LRG (VASCULAR PRODUCTS) ×3 IMPLANT
GLIDESHEATH SLEND A-KIT 6F 22G (SHEATH) ×3 IMPLANT
KIT HEART LEFT (KITS) ×3 IMPLANT
KIT HEART RIGHT NAMIC (KITS) ×3 IMPLANT
PACK CARDIAC CATHETERIZATION (CUSTOM PROCEDURE TRAY) ×3 IMPLANT
SHEATH FAST CATH BRACH 5F 5CM (SHEATH) ×3 IMPLANT
SYR MEDRAD MARK V 150ML (SYRINGE) ×3 IMPLANT
TRANSDUCER W/STOPCOCK (MISCELLANEOUS) ×6 IMPLANT
TUBING CIL FLEX 10 FLL-RA (TUBING) ×3 IMPLANT
WIRE SAFE-T 1.5MM-J .035X260CM (WIRE) ×3 IMPLANT

## 2016-01-16 NOTE — CV Procedure (Deleted)
6Fr Brachial sheath was removed, manual pressure was applied for forty minutes.  Gauze dressing and tegaderm dressing was applied.  No bleeding or hematoma.

## 2016-01-16 NOTE — H&P (View-Only) (Signed)
Patient ID: Becky Murphy, female DOB: [birth year], 65 y.o. MRN: [id number]     Cardiology Office Note    Date: 01/05/2016   ID: Becky Murphy, DOB 1951-04-21, MRN [id number]  PCP: Kaleen Mask, MD Cardiologist: Thurmon Fair, MD   Chief Complaint  Patient presents with  . Follow-up    has chest pain, has shortness of breath, no edema, has pain in legs, has cramping in legs, has lightheadedness and dizziness    History of Present Illness:  Becky Murphy is a 65 y.o. female recently hospitalized with chest pain that was felt to be musculoskeletal in etiology, during which hospitalization a murmur was identified. She has mitral valve prolapse of the posterior leaflet with asymptomatic mild to moderate mitral valve prolapse. The left ventricle showed normal size, systolic and diastolic function.  Since discharge she continues to describe exertional dyspnea NYHA class 2 and constant chest pressure, worse when lying down on right or left side, less severe when lying on back or sitting up. No true orthopnea. She recalls relatively abrupt onset of dyspnea several months ago. Also denies palpitations, syncope, presyncope, leg edema, claudication or focal neurological complaints.  Past Medical History  Diagnosis Date  . Anxiety   . Depression   . History of peptic ulcer "late 1970's"  . Arthritis     "back?" (11/03/2015)  . Migraine     "maybe 3-4/yr now" (11/03/2015)    Past Surgical History  Procedure Laterality Date  . Back surgery    . Breast biopsy Left ~ 2005  . Breast lumpectomy Left ~ 2005  . Lumbar disc surgery  1992    "trimmed bulges off both sides"  . Tubal ligation    . Eye muscle surgery Bilateral 1970's?    No current facility-administered medications for this visit.   No current outpatient prescriptions on file.   Facility-Administered Medications Ordered  in Other Visits  Medication Dose Route Frequency Provider Last Rate Last Dose  . 0.9 % sodium chloride infusion  Intravenous Continuous Brendolyn Stockley, MD      Allergies: Review of patient's allergies indicates no known allergies.   Social History   Social History  . Marital Status: Married    Spouse Name: N/A  . Number of Children: N/A  . Years of Education: N/A   Social History Main Topics  . Smoking status: Never Smoker   . Smokeless tobacco: Never Used  . Alcohol Use: 6.0 oz/week    6 Glasses of wine, 4 Shots of liquor per week  . Drug Use: No  . Sexual Activity: Not Currently   Other Topics Concern  . None   Social History Narrative     Family History: The patient's family history includes Cancer in her father, mother, and sister.   ROS:  Please see the history of present illness.  Review of Systems  All other systems reviewed and are negative.  All other systems reviewed and are negative.   PHYSICAL EXAM:   VS: BP 118/82 mmHg  Pulse 54  Ht  (1.676 m)  Wt 141 lb 5 oz (64.099 kg)  BMI 22.82 kg/m2  GEN: Well nourished, well developed, in no acute distress  HEENT: normal  Neck: no JVD, carotid bruits, or masses Cardiac: RRR; no rubs, or gallops,no edema. 3/6 late peaking holosystolic murmur heard best at the LLSB, less in the apical area, not radiating to axilla or aortic focus Respiratory: clear to auscultation bilaterally, normal work of breathing GI: soft, nontender, nondistended, +  BS MS: no deformity or atrophy  Skin: warm and dry, no rash Neuro: Alert and Oriented x 3, Strength and sensation are intact Psych: euthymic mood, full affect  Wt Readings from Last 3 Encounters:  01/03/16 141 lb 5 oz (64.099 kg)  11/02/15 138 lb 12.8 oz (62.959 kg)  05/15/15 142 lb 9.6 oz (64.683 kg)      Studies/Labs Reviewed:   EKG: EKG is ordered today. The ekg  ordered today demonstrates sinus bradycardia, otherwise normal tracing  Recent Labs: 11/02/2015: ALT 14; BUN 17; Creatinine, Ser 0.69; Hemoglobin 14.8; Platelets 196; Potassium 3.0*; Sodium 140   Lipid Panel  Labs (Brief)    No results found for: CHOL, TRIG, HDL, CHOLHDL, VLDL, LDLCALC, LDLDIRECT      ASSESSMENT:    1.   1. Mitral valve prolapse with mild-moderate mitral insufficiency   2. Exertional dyspnea   3. Precordial pain      PLAN:  In order of problems listed above:  1. Unclear whether her dyspnea has anything to do with her valve disease. Suggested that she have a transesophageal echocardiogram to make sure we are not underestimating the severity of mitral regurgitation, especially if she has an eccentric jet. Discussed the potential long term risk of worsening MR and small risk of infective endocarditis, need for surgery, arrhythmia (note mild left atrial dilation on echo). The TEE procedure and moderate sedation were fully reviewed with the patient and informed consent was obtained.  2. As above. If MR does not appear to be severe, consider evaluation for pulmonary disease  3. This would be highly atypical for angina pectoralis and does not appear to be worsened by physical activity, but rather is positional. It does appear to be most likely musculoskeletal. It does not have the typical pleuritic quality of pericarditis or pleuritis. She is at low risk for coronary artery disease based on her risk factor profile.   Medication Adjustments/Labs and Tests Ordered: Current medicines are reviewed at length with the patient today. Concerns regarding medicines are outlined above. Medication changes, Labs and Tests ordered today are listed below. Patient Instructions  Your physician has requested that you have a TEE Friday afternoon, 01/05/16, with Dr Royann Shiversroitoru. During a TEE, sound waves are used to create images of your heart. It provides your doctor with  information about the size and shape of your heart and how well your heart's chambers and valves are working. In this test, a transducer is attached to the end of a flexible tube that's guided down your throat and into your esophagus (the tube leading from you mouth to your stomach) to get a more detailed image of your heart. You are not awake for the procedure. Please see the instruction sheet given to you today. For further information please visit [email or web address].        Joie BimlerSigned, Ameliyah Sarno, MD  01/05/2016 12:44 PM  [facility] [address], [place], [place] [address] Phone: [phone]([phone]; Fax: [phone]

## 2016-01-16 NOTE — Interval H&P Note (Signed)
History and Physical Interval Note:  01/16/2016 12:32 PM  Becky Murphy  has presented today for surgery, with the diagnosis of mr  The various methods of treatment have been discussed with the patient and family. After consideration of risks, benefits and other options for treatment, the patient has consented to  Procedure(s): Right/Left Heart Cath and Coronary Angiography (N/A) as a surgical intervention .  The patient's history has been reviewed, patient examined, no change in status, stable for surgery.  I have reviewed the patient's chart and labs.  Questions were answered to the patient's satisfaction.     Dang Mathison

## 2016-01-16 NOTE — Op Note (Signed)
CARDIAC CATHETERIZATION REPORT   Procedures performed:  1. Right and left heart catheterization  2. Selective coronary angiography  3. 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 Atypical chest pain.  Consent: The risks, benefits, and details of the procedure were explained to the patient. Risks including death, MI, stroke, bleeding, limb ischemia, renal failure and allergy were described and accepted by the patient. Informed written consent was obtained prior to proceeding.  Technique: The patient was brought to the cardiac catheterization laboratory in the fasting state. He was prepped and draped in the usual sterile fashion. Local anesthesia with 1% lidocaine was administered to the right wrist area. AA right antecubital IV was excganged for a short venous sheath, 62F. A 62F PA catheter was advanced to the main pulmonary artery, used to record pressures and PA oxygen saturation. Using the modified Seldinger technique a 5 French right radial artery sheath was introduced without difficulty. Under fluoroscopic guidance, using 5 French TIG and angled pigtail catheters, selective cannulation of the left coronary artery, right coronary artery and left ventricle were respectively performed. Several coronary angiograms in a variety of projections were recorded, as well as a left ventriculogram in the RAO projection. Left ventricular pressure and a pull back to the aorta were recorded. No immediate complications occurred. At the end of the procedure, all catheters were removed. After the procedure, hemostasis will be achieved with a TR band.  Contrast used: 50 mL Omnipaque Medications: Versed 2 mg + Fentanyl 50 mcg IV,  Heparin 3500 units IV, Verapamil 5 mg IV  Angiographic Findings:  1. The left main coronary artery is free of significant atherosclerosis and bifurcates in the usual fashion into the left anterior descending artery and a dominant left circumflex coronary artery.  2. The left anterior descending artery is a large vessel that reaches the apex and generates 2 major diagonal branches. There is evidence of no luminal irregularities and no calcification. No hemodynamically meaningful stenoses are seen. 3. The left circumflex coronary artery is a large-size vessel dominant vessel that generates 4 major oblique marginal arteries and the posterior descending artery. There is evidence of no luminal irregularities and no calcification. No hemodynamically meaningful stenoses are seen. 4. The right coronary artery is a small-size non-dominant vessel that generates RV branches.  5. The left ventricle is normal in size. The left ventricle systolic function is normalwith an estimated ejection fraction of 65-70%. Regional wall motion abnormalities are no seen. No left ventricular thrombus is seen. There is at least moderate mitral insufficiency (PVCs during injection make it harder to evaluate). The ascending aorta appears normal. There is no aortic valve stenosis by pullback. The left ventricular end-diastolic pressure is 7 mm Hg.    Hemodynamic findings:  Aortic pressure 108/68 (mean 87) mm Hg   Left ventricle 104/0 with end-diastolic pressure of 7 mm Hg  PA wedge pressure a wave 7, v wave 7 (mean 5) mm Hg  Pulmonary artery 23/6 (mean 14) mm Hg  Right ventricle 25/2 with an end-diastolic pressure of 3 mm Hg  Right atrium a wave 5, v wave 2 (mean 3) mm Hg  Cardiac output is 5.0 L per minute (cardiac index 2.9 L per minute per meter sq)  IMPRESSIONS:  Moderate (possibly severe, eccentric) mitral insufficiency. Normal coronary arteries. Normal hemodynamics.   RECOMMENDATION:  Monitor closely, will  eventually require referral for MV repair.    Thurmon Fair, MD, [facility] [phone] office [phone] pager

## 2016-01-16 NOTE — CV Procedure (Signed)
6Fr Brachial sheath removed, manual pressure applied for forty minutes.  No bleeding or hematoma.  Gauze and tegaderm dressing applied.

## 2016-01-16 NOTE — Discharge Instructions (Signed)
Radial Site Care °Refer to this sheet in the next few weeks. These instructions provide you with information about caring for yourself after your procedure. Your health care provider may also give you more specific instructions. Your treatment has been planned according to current medical practices, but problems sometimes occur. Call your health care provider if you have any problems or questions after your procedure. °WHAT TO EXPECT AFTER THE PROCEDURE °After your procedure, it is typical to have the following: °· Bruising at the radial site that usually fades within 1-2 weeks. °· Blood collecting in the tissue (hematoma) that may be painful to the touch. It should usually decrease in size and tenderness within 1-2 weeks. °HOME CARE INSTRUCTIONS °· Take medicines only as directed by your health care provider. °· You may shower 24-48 hours after the procedure or as directed by your health care provider. Remove the bandage (dressing) and gently wash the site with plain soap and water. Pat the area dry with a clean towel. Do not rub the site, because this may cause bleeding. °· Do not take baths, swim, or use a hot tub until your health care provider approves. °· Check your insertion site every day for redness, swelling, or drainage. °· Do not apply powder or lotion to the site. °· Do not flex or bend the affected arm for 24 hours or as directed by your health care provider. °· Do not push or pull heavy objects with the affected arm for 24 hours or as directed by your health care provider. °· Do not lift over 10 lb (4.5 kg) for 5 days after your procedure or as directed by your health care provider. °· Ask your health care provider when it is okay to: °¨ Return to work or school. °¨ Resume usual physical activities or sports. °¨ Resume sexual activity. °· Do not drive home if you are discharged the same day as the procedure. Have someone else drive you. °· You may drive 24 hours after the procedure unless otherwise  instructed by your health care provider. °· Do not operate machinery or power tools for 24 hours after the procedure. °· If your procedure was done as an outpatient procedure, which means that you went home the same day as your procedure, a responsible adult should be with you for the first 24 hours after you arrive home. °· Keep all follow-up visits as directed by your health care provider. This is important. °SEEK MEDICAL CARE IF: °· You have a fever. °· You have chills. °· You have increased bleeding from the radial site. Hold pressure on the site. Call [phone] °SEEK IMMEDIATE MEDICAL CARE IF: °· You have unusual pain at the radial site. °· You have redness, warmth, or swelling at the radial site. °· You have drainage (other than a small amount of blood on the dressing) from the radial site. °· The radial site is bleeding, and the bleeding does not stop after 30 minutes of holding steady pressure on the site. °· Your arm or hand becomes pale, cool, tingly, or numb. °  °This information is not intended to replace advice given to you by your health care provider. Make sure you discuss any questions you have with your health care provider. °  °Document Released: 01/18/2011 Document Revised: 01/06/2015 Document Reviewed: 07/04/2014 °[employer] Interactive Patient Education ©2016 [employer] ° °

## 2016-01-17 ENCOUNTER — Encounter (HOSPITAL_COMMUNITY): Payer: Self-pay | Admitting: Cardiovascular Disease

## 2016-01-17 MED FILL — Heparin Sodium (Porcine) 2 Unit/ML in Sodium Chloride 0.9%: INTRAMUSCULAR | Qty: 500 | Status: AC

## 2016-01-17 MED FILL — Nitroglycerin IV Soln 100 MCG/ML in D5W: INTRA_ARTERIAL | Qty: 10 | Status: AC

## 2016-01-22 ENCOUNTER — Institutional Professional Consult (permissible substitution) (INDEPENDENT_AMBULATORY_CARE_PROVIDER_SITE_OTHER): Payer: BC Managed Care – PPO | Admitting: Thoracic Surgery (Cardiothoracic Vascular Surgery)

## 2016-01-22 ENCOUNTER — Other Ambulatory Visit: Payer: Self-pay | Admitting: *Deleted

## 2016-01-22 ENCOUNTER — Encounter: Payer: Self-pay | Admitting: Thoracic Surgery (Cardiothoracic Vascular Surgery)

## 2016-01-22 VITALS — BP 118/84 | HR 58 | Resp 16 | Ht 66.0 in | Wt 141.0 lb

## 2016-01-22 DIAGNOSIS — I341 Nonrheumatic mitral (valve) prolapse: Secondary | ICD-10-CM

## 2016-01-22 DIAGNOSIS — I7409 Other arterial embolism and thrombosis of abdominal aorta: Secondary | ICD-10-CM

## 2016-01-22 DIAGNOSIS — I340 Nonrheumatic mitral (valve) insufficiency: Secondary | ICD-10-CM | POA: Diagnosis not present

## 2016-01-22 DIAGNOSIS — I712 Thoracic aortic aneurysm, without rupture, unspecified: Secondary | ICD-10-CM

## 2016-01-22 MED ORDER — AMIODARONE HCL 200 MG PO TABS: 200.0000 mg | ORAL_TABLET | Freq: Every day | ORAL | Status: DC

## 2016-01-22 NOTE — Patient Instructions (Addendum)
Continue all previous medications without any changes at this time  Begin taking amiodarone 7 days prior to surgery and stop taking metoprolol at that time

## 2016-01-22 NOTE — Progress Notes (Signed)
[address]       [place] [address]             [phone]     CARDIOTHORACIC SURGERY CONSULTATION REPORT  Referring Provider is Croitoru, Rachelle Hora, MD PCP is Kaleen Mask, MD  Chief Complaint  Patient presents with  . Mitral Regurgitation    prolapse, insuff...ECHO 11/03/15, TEE 01/05/16    HPI:  Patient is a 65 year old female with recently discovered mitral valve prolapse who has been referred for surgical consultation to discuss treatment options for management of severe symptomatic mitral regurgitation.  Patient states that she has been relatively healthy and physically active for the majority of her adult life. Last spring she first began to experience symptoms of exertional shortness of breath, fatigue, and atypical chest discomfort. She began to experience intermittent dizzy spells.  She presented to the hospital acutely in early November with atypical chest discomfort, shortness of breath, and dizziness.  EKG revealed normal sinus rhythm without ischemic changes and the patient ruled out for acute myocardial infarction based on serial cardiac enzymes. She was noted to have a systolic murmur on physical exam and transthoracic echocardiogram revealed mitral valve prolapse with mild to moderate mitral regurgitation. The patient was seen in follow-up by Dr. Royann Shivers who noted that the patient had a fairly prominent murmur on physical exam and symptoms suggestive of symptomatic mitral valve disease. Transesophageal echocardiogram was recommended.  TEE performed 01/05/2016 confirmed the presence of holosystolic prolapse of a large segment of the posterior leaflet of the mitral valve with severe mitral regurgitation, with an eccentric jet coursing anteriorly around the left atrium. Left ventricular size and systolic function remains normal. The patient subsequently underwent left and right heart catheterization on 01/16/2016 revealed normal coronary artery anatomy  with no significant coronary artery disease. Pulmonary artery pressures were normal. The patient was referred for surgical consultation.  The patient is married and lives with her husband locally in [place].  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 She has limited physically active life and enjoys camping and traveling to their vacation properties at the [place] and in the mountains. She has mild arthritis but otherwise reports no significant physical limitations. She describes progressive symptoms of exertional shortness of breath over the last 6-9 months consistent with chronic diastolic congestive heart failure, New York Heart Association functional class IIB-III.  She states that she occasionally gets short of breath at night when she tries to sleep and she cannot lay flat in bed. She states that her breathing is improved when she sits upright. She has had frequent palpitations and dizzy spells without syncope. She has not had lower extremity edema. She describes atypical pressure-like tightness across her upper chest that is worse when she is feeling short of breath. She now gets short of breath with relatively mild activity. Her energy level has decreased considerably. She has not had fevers or chills. She reports recent development of a dry nonproductive cough.  Past Medical History  Diagnosis Date  . Anxiety   . Depression   . History of peptic ulcer "late 1970's"  . Arthritis     "back?" (11/03/2015)  . Migraine     "maybe 3-4/yr now" (11/03/2015)  . Mitral valve prolapse 12/26/2015  . 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 Exertional dyspnea 01/05/2016  . Chest pain 11/02/2015    Past Surgical History  Procedure Laterality Date  . Back surgery    .  Breast biopsy Left ~ 2005  . Breast lumpectomy Left ~ 2005  . Lumbar disc surgery  1992    "trimmed bulges off both sides"  . Tubal ligation    . Eye muscle surgery Bilateral 1970's?  Rhae Hammock without  cardioversion N/A 01/05/2016    Procedure: TRANSESOPHAGEAL ECHOCARDIOGRAM (TEE);  Surgeon: Thurmon Fair, MD;  Location: [facility] ENDOSCOPY;  Service: Cardiovascular;  Laterality: N/A;  . Cardiac catheterization N/A 01/16/2016    Procedure: Right/Left Heart Cath and Coronary Angiography;  Surgeon: Thurmon Fair, MD;  Location: [facility] INVASIVE CV LAB;  Service: Cardiovascular;  Laterality: N/A;    Family History  Problem Relation Age of Onset  . Hypertension Mother   . Cancer Mother 67    MELANOMA  . Cancer Father     PROSTATE  . Cancer Sister 55    BREAST    Social History   Social History  . Marital Status: Married    Spouse Name: N/A  . Number of Children: N/A  . Years of Education: N/A   Occupational History  . Not on file.   Social History Main Topics  . Smoking status: Never Smoker   . Smokeless tobacco: Never Used  . Alcohol Use: 6.0 oz/week    6 Glasses of wine, 4 Shots of liquor per week  . Drug Use: No  . Sexual Activity: Not Currently   Other Topics Concern  . Not on file   Social History Narrative    Current Outpatient Prescriptions  Medication Sig Dispense Refill  . ALPRAZolam (XANAX) 0.5 MG tablet Take 0.5 mg by mouth 3 (three) times daily as needed for anxiety.    . diphenhydrAMINE (BENADRYL) 25 MG tablet Take 25 mg by mouth every 6 (six) hours as needed.    Marland Kitchen FLUoxetine (PROZAC) 40 MG capsule Take 40 mg by mouth daily.    Marland Kitchen ibuprofen (ADVIL,MOTRIN) 200 MG tablet Take 400 mg by mouth every 6 (six) hours as needed for moderate pain.     . metoprolol succinate (TOPROL-XL) 50 MG 24 hr tablet Take 50 mg by mouth daily. Take with or immediately following a meal.    . amiodarone (PACERONE) 200 MG tablet Take 1 tablet (200 mg total) by mouth daily. 60 tablet 0   No current facility-administered medications for this visit.    No Known Allergies    Review of Systems:   General:  normal appetite, decreased energy, no weight gain, no weight loss, no  fever  Cardiac:  + chest pain with exertion, no chest pain at rest, + SOB with exertion, occasional resting SOB, + PND, + orthopnea, + palpitations, no arrhythmia, no atrial fibrillation, no LE edema, + dizzy spells, no syncope  Respiratory:  + shortness of breath, no home oxygen, no productive cough, + dry cough, no bronchitis, no wheezing, no hemoptysis, no asthma, no pain with inspiration or cough, no sleep apnea, no CPAP at night  GI:   no difficulty swallowing, no reflux, no frequent heartburn, no hiatal hernia, no abdominal pain, no constipation, no diarrhea, no hematochezia, no hematemesis, no melena  GU:   no dysuria,  no frequency, no urinary tract infection, no hematuria, no kidney stones, no kidney disease  Vascular:  no pain suggestive of claudication, no pain in feet, + leg cramps, no varicose veins, no DVT, no non-healing foot ulcer  Neuro:   no stroke, no TIA's, no seizures, no headaches, no temporary blindness one eye,  no slurred speech, no peripheral neuropathy, no chronic  pain, no instability of gait, no memory/cognitive dysfunction  Musculoskeletal: + arthritis, no joint swelling, no myalgias, no difficulty walking, normal mobility   Skin:   no rash, no itching, no skin infections, no pressure sores or ulcerations  Psych:   no anxiety, no depression, no nervousness, no unusual recent stress  Eyes:   no blurry vision, no floaters, no recent vision changes, + wears glasses or contacts  ENT:   no hearing loss, no loose or painful teeth, no dentures, last saw dentist June 2016  Hematologic:  no easy bruising, no abnormal bleeding, no clotting disorder, no frequent epistaxis  Endocrine:  no diabetes, does not check CBG's at home     Physical Exam:   BP 118/84 mmHg  Pulse 58  Resp 16  Ht 5\' 6"  (1.676 m)  Wt 141 lb (63.957 kg)  BMI 22.77 kg/m2  SpO2 95%  General:    well-appearing  HEENT:  Unremarkable   Neck:   no JVD, no bruits, no adenopathy   Chest:   clear to  auscultation, symmetrical breath sounds, no wheezes, no rhonchi   CV:   RRR, grade IV/VI holosystolic murmur   Abdomen:  soft, non-tender, no masses   Extremities:  warm, well-perfused, pulses palpable, no LE edema  Rectal/GU  Deferred  Neuro:   Grossly non-focal and symmetrical throughout  Skin:   Clean and dry, no rashes, no breakdown   Diagnostic Tests:  Transthoracic Echocardiography  Patient:  Becky Murphy, Becky Murphy MR #:    [id number] Study Date: 11/03/2015 Gender:   F Age:    32 Height:   162.6 cm Weight:   62.6 kg BSA:    1.69 m^2 Pt. Status: Room:    3W23C  ADMITTING  Ghimire, Shanker M Candy Sledge, Shanker M REFERRING  Ghimire, Shanker M ATTENDING  Alvira Monday PERFORMING  [facility], Inpatient SONOGRAPHER Thurman Coyer  cc:  ------------------------------------------------------------------- LV EF: 55% -  60%  ------------------------------------------------------------------- Indications:   Chest pain 786.51.  ------------------------------------------------------------------- History:  PMH: No prior cardiac history.  ------------------------------------------------------------------- Study Conclusions  - Left ventricle: The cavity size was normal. Wall thickness was normal. Systolic function was normal. The estimated ejection fraction was in the range of 55% to 60%. Wall motion was normal; there were no regional wall motion abnormalities. Left ventricular diastolic function parameters were normal. - Aortic valve: There was trivial regurgitation. - Mitral valve: Prolapse. Prolapse. There was mild to moderate regurgitation. Valve area by continuity equation (using LVOT flow): 2.8 cm^2. - Left atrium: The atrium was mildly dilated.  Transthoracic echocardiography. M-mode, complete 2D, spectral Doppler, and color Doppler. Birthdate: Patient birthdate: June 23, 1951. Age: Patient is 65 yr old.  Sex: Gender: female. BMI: 23.7 kg/m^2. Blood pressure:   115/75 Patient status: Inpatient. Study date: Study date: 11/03/2015. Study time: 10:47 AM. Location: Bedside.  -------------------------------------------------------------------  ------------------------------------------------------------------- Left ventricle: The cavity size was normal. Wall thickness was normal. Systolic function was normal. The estimated ejection fraction was in the range of 55% to 60%. Wall motion was normal; there were no regional wall motion abnormalities. The transmitral flow pattern was normal. The deceleration time of the early transmitral flow velocity was normal. The pulmonary vein flow pattern was normal. The tissue Doppler parameters were normal. Left ventricular diastolic function parameters were normal.  ------------------------------------------------------------------- Aortic valve:  Structurally normal valve. Trileaflet. Cusp separation was normal. Doppler: Transvalvular velocity was within the normal range. There was no stenosis. There was trivial regurgitation.  ------------------------------------------------------------------- Aorta: The aorta was normal,  not dilated, and non-diseased.  ------------------------------------------------------------------- Mitral valve:  Prolapse. There is prolapse of the posterior leaflet of the mitral valve. There is mild-moderate mitral regurgitation directed toward the atrial septum. Prolapse. Doppler: There was mild to moderate regurgitation.  Valve area by continuity equation (using LVOT flow): 2.8 cm^2. Indexed valve area by continuity equation (using LVOT flow): 1.66 cm^2/m^2. Mean gradient (D): 4 mm Hg. Peak gradient (D): 7 mm Hg.  ------------------------------------------------------------------- Left atrium: The atrium was mildly dilated.  ------------------------------------------------------------------- Right  ventricle: The cavity size was normal. Wall thickness was normal. Systolic function was normal.  ------------------------------------------------------------------- Pulmonic valve:  Structurally normal valve.  Cusp separation was normal. Doppler: Transvalvular velocity was within the normal range. There was no regurgitation.  ------------------------------------------------------------------- Tricuspid valve:  Normal thickness leaflets. Doppler: There was mild regurgitation.  ------------------------------------------------------------------- Right atrium: The atrium was at the upper limits of normal in size.  ------------------------------------------------------------------- Pericardium: There was no pericardial effusion.  ------------------------------------------------------------------- Systemic veins: Inferior vena cava: The vessel was normal in size. The respirophasic diameter changes were in the normal range (>= 50%), consistent with normal central venous pressure.  ------------------------------------------------------------------- Post procedure conclusions Ascending Aorta:  - The aorta was normal, not dilated, and non-diseased.  ------------------------------------------------------------------- Measurements  Left ventricle              Value     Reference LV ID, ED, PLAX chordal      (H)   53.1 mm    43 - 52 LV ID, ES, PLAX chordal          32.9 mm    23 - 38 LV fx shortening, PLAX chordal      38  %    >=29 LV PW thickness, ED            9.15 mm    --------- IVS/LV PW ratio, ED            0.99      <=1.3 Stroke volume, 2D             128  ml    --------- Stroke volume/bsa, 2D           76  ml/m^2  --------- LV ejection fraction, 1-p A4C       59  %    --------- LV end-diastolic volume, 2-p       81  ml     --------- LV end-systolic volume, 2-p        30  ml    --------- LV ejection fraction, 2-p         63  %    --------- Stroke volume, 2-p            51  ml    --------- LV end-diastolic volume/bsa, 2-p     48  ml/m^2  --------- LV end-systolic volume/bsa, 2-p      18  ml/m^2  --------- Stroke volume/bsa, 2-p          30.3 ml/m^2  --------- LV e&', lateral              9.36 cm/s   --------- LV E/e&', lateral             13.78     --------- LV e&', medial               8.59 cm/s   --------- LV E/e&', medial              15.02     --------- LV  e&', average              8.98 cm/s   --------- LV E/e&', average             14.37     ---------  Ventricular septum            Value     Reference IVS thickness, ED             9.1  mm    ---------  LVOT                   Value     Reference LVOT ID, S                23  mm    --------- LVOT area                 4.15 cm^2   --------- LVOT peak velocity, S           144  cm/s   --------- LVOT mean velocity, S           73.4 cm/s   --------- LVOT VTI, S                30.8 cm    --------- LVOT peak gradient, S           8   mm Hg  ---------  Aorta                   Value     Reference Aortic root ID, ED            32  mm    ---------  Left atrium                Value     Reference LA ID, A-P, ES              43  mm    --------- LA ID/bsa, A-P          (H)   2.55 cm/m^2  <=2.2 LA volume, S               82.2 ml    --------- LA volume/bsa, S             48.7 ml/m^2   --------- LA volume, ES, 1-p A4C          69.7 ml    --------- LA volume/bsa, ES, 1-p A4C        41.3 ml/m^2  --------- LA volume, ES, 1-p A2C          86.4 ml    --------- LA volume/bsa, ES, 1-p A2C        51.2 ml/m^2  ---------  Mitral valve               Value     Reference Mitral E-wave peak velocity        129  cm/s   --------- Mitral A-wave peak velocity        81.9 cm/s   --------- Mitral mean velocity, D          94.6 cm/s   --------- Mitral deceleration time         215  ms    150 - 230 Mitral mean gradient, D          4   mm Hg  --------- Mitral peak gradient, D          7  mm Hg  --------- Mitral E/A ratio, peak          1.6      --------- Mitral valve area, LVOT          2.8  cm^2   --------- continuity Mitral valve area/bsa, LVOT        1.66 cm^2/m^2 --------- continuity Mitral annulus VTI, D           45.7 cm    ---------  Pulmonary arteries            Value     Reference PA pressure, S, DP            28  mm Hg  <=30  Tricuspid valve              Value     Reference Tricuspid regurg peak velocity      252  cm/s   --------- Tricuspid peak RV-RA gradient       25  mm Hg  ---------  Systemic veins              Value     Reference Estimated CVP               3   mm Hg  ---------  Right ventricle              Value     Reference RV pressure, S, DP            28  mm Hg  <=30 RV s&', lateral, S             17.8 cm/s   ---------  Legend: (L) and (H) mark values outside specified reference range.  ------------------------------------------------------------------- Prepared and Electronically  Authenticated by  Cassell Clement, MD 2016-11-04T12:58:24   Transesophageal Echocardiography  Patient:  Nawaal, Alling MR #:    [id number] Study Date: 01/05/2016 Gender:   F Age:    31 Height:   167.6 cm Weight:   64.1 kg BSA:    1.73 m^2 Pt. Status: Room:  ADMITTING  Thurmon Fair, MD ATTENDING  Thurmon Fair, MD ORDERING   Thurmon Fair, MD PERFORMING  Thurmon Fair, MD REFERRING  Thurmon Fair, MD SONOGRAPHER Jimmy Reel, RDCS  cc:  ------------------------------------------------------------------- LV EF: 60% -  65%  ------------------------------------------------------------------- Indications:   Mitral valve prolapse 424.0. Mitral regurgitation 424.0.  ------------------------------------------------------------------- Study Conclusions  - Left ventricle: Systolic function was normal. The estimated ejection fraction was in the range of 60% to 65%. Wall motion was normal; there were no regional wall motion abnormalities. - Aortic valve: There was trivial regurgitation. - Mitral valve: Severe, holosystolicprolapse, involving the middle scallop of the posterior leaflet. There was moderate to severe regurgitation directed eccentrically and anteriorly. - Left atrium: No evidence of thrombus in the atrial cavity or appendage. - Right atrium: No evidence of thrombus in the atrial cavity or appendage.  Impressions:  - The sevrity of mitral insufficiency was likely underestimated on transthoracic echo due to the highly eccentric mitral insufficiency jet.  Diagnostic transesophageal echocardiography. 2D and color Doppler. Birthdate: Patient birthdate: January 23, 1951. Age: Patient is 65 yr old. Sex: Gender: female.  BMI: 22.8 kg/m^2. Blood pressure: 130/80 Patient status: Outpatient. Study date: Study date: 01/05/2016. Study time: 01:16  PM.  -------------------------------------------------------------------  ------------------------------------------------------------------- Left ventricle: Systolic function was normal. The estimated ejection fraction was in the range of 60% to 65%. Wall motion was normal; there were no regional wall motion abnormalities.  ------------------------------------------------------------------- Aortic valve:  Structurally  normal valve. Trileaflet; normal thickness leaflets. Cusp separation was normal. Doppler: There was trivial regurgitation.  ------------------------------------------------------------------- Aorta: There was no atheroma. There was no evidence for dissection. Aortic root: The aortic root was not dilated. Ascending aorta: The ascending aorta was normal in size. Aortic arch: The aortic arch was normal in size. Descending aorta: The descending aorta was normal in size.  ------------------------------------------------------------------- Mitral valve:  Mildly thickened leaflets . Mild myxomatous degeneration. Leaflet separation was normal. Severe, holosystolicprolapse, involving the middle scallop of the posterior leaflet. Doppler: There was moderate to severe regurgitation directed eccentrically and anteriorly.  ------------------------------------------------------------------- Left atrium: The atrium was normal in size. No evidence of thrombus in the atrial cavity or appendage. The appendage was morphologically a left appendage, multilobulated, and of normal size. Emptying velocity was normal.  ------------------------------------------------------------------- Right ventricle: The cavity size was normal. Wall thickness was normal. Systolic function was normal.  ------------------------------------------------------------------- Pulmonic valve:  Structurally normal  valve.  ------------------------------------------------------------------- Tricuspid valve:  Structurally normal valve.  Leaflet separation was normal. Doppler: There was mild regurgitation.  ------------------------------------------------------------------- Pulmonary artery:  The main pulmonary artery was normal-sized.  ------------------------------------------------------------------- Right atrium: The atrium was normal in size. No evidence of thrombus in the atrial cavity or appendage. The appendage was morphologically a right appendage.  ------------------------------------------------------------------- Pericardium: There was no pericardial effusion.  ------------------------------------------------------------------- Measurements  Mitral valve                Value    Reference Mitral maximal regurg velocity, PISA    495  cm/s --------- Mitral regurg VTI, PISA           157  cm  --------- Mitral ERO, PISA              0.68  cm^2 --------- Mitral regurg volume, PISA         107  ml  ---------  Pulmonary arteries             Value    Reference PA pressure, S, DP             26   mm Hg <=30  Tricuspid valve               Value    Reference Tricuspid regurg peak velocity       239.63 cm/s --------- Tricuspid peak RV-RA gradient        23   mm Hg --------- Tricuspid maximal regurg velocity,     239.63 cm/s --------- PISA  Systemic veins               Value    Reference Estimated CVP                3   mm Hg ---------  Right ventricle               Value    Reference RV pressure, S, DP             26   mm Hg <=30  Legend: (L) and (H) mark values outside specified reference  range.  ------------------------------------------------------------------- Prepared and Electronically Authenticated by  Thurmon Fair, MD 2017-01-06T16:24:49   CARDIAC CATHETERIZATION Procedures    Right/Left Heart Cath and Coronary Angiography    Conclusion     Procedures performed:  1. Right and left heart catheterization  2. Selective coronary angiography  3. Left ventriculography   Reason for procedure:   Unexplained atypical chest pain Valvular heart disease  Procedure performed by: Thurmon Fair, MD,  FACC  Complications: none   Estimated blood loss: less than 5 mL   History: 65 year old with moderate to severe mitral insufficiency due to P2 scallop holosystolic prolapse. Atypical chest pain.  Consent: The risks, benefits, and details of the procedure were explained to the patient. Risks including death, MI, stroke, bleeding, limb ischemia, renal failure and allergy were described and accepted by the patient. Informed written consent was obtained prior to proceeding.  Technique: The patient was brought to the cardiac catheterization laboratory in the fasting state. He was prepped and draped in the usual sterile fashion. Local anesthesia with 1% lidocaine was administered to the right wrist area. AA right antecubital IV was excganged for a short venous sheath, 27F. A 27F PA catheter was advanced to the main pulmonary artery, used to record pressures and PA oxygen saturation. Using the modified Seldinger technique a 5 French right radial artery sheath was introduced without difficulty. Under fluoroscopic guidance, using 5 French TIG and angled pigtail catheters, selective cannulation of the left coronary artery, right coronary artery and left ventricle were respectively performed. Several coronary angiograms in a variety of projections were recorded, as well as a left ventriculogram in the RAO projection. Left ventricular pressure and a pull back to the aorta were  recorded. No immediate complications occurred. At the end of the procedure, all catheters were removed. After the procedure, hemostasis will be achieved with a TR band.  Contrast used: 50 mL Omnipaque Medications: Versed 2 mg + Fentanyl 50 mcg IV, Heparin 3500 units IV, Verapamil 5 mg IV  Angiographic Findings:  1. The left main coronary artery is free of significant atherosclerosis and bifurcates in the usual fashion into the left anterior descending artery and a dominant left circumflex coronary artery.  2. The left anterior descending artery is a large vessel that reaches the apex and generates 2 major diagonal branches. There is evidence of no luminal irregularities and no calcification. No hemodynamically meaningful stenoses are seen. 3. The left circumflex coronary artery is a large-size vessel dominant vessel that generates 4 major oblique marginal arteries and the posterior descending artery. There is evidence of no luminal irregularities and no calcification. No hemodynamically meaningful stenoses are seen. 4. The right coronary artery is a small-size non-dominant vessel that generates RV branches.  5. The left ventricle is normal in size. The left ventricle systolic function is normalwith an estimated ejection fraction of 65-70%. Regional wall motion abnormalities are no seen. No left ventricular thrombus is seen. There is at least moderate mitral insufficiency (PVCs during injection make it harder to evaluate). The ascending aorta appears normal. There is no aortic valve stenosis by pullback. The left ventricular end-diastolic pressure is 7 mm Hg.    Hemodynamic findings:  Aortic pressure 108/68 (mean 87) mm Hg   Left ventricle 104/0 with end-diastolic pressure of 7 mm Hg  PA wedge pressure a wave 7, v wave 7 (mean 5) mm Hg  Pulmonary artery 23/6 (mean 14) mm Hg  Right ventricle 25/2 with an end-diastolic pressure of 3 mm Hg  Right atrium a wave 5, v wave 2 (mean 3) mm  Hg  Cardiac output is 5.0 L per minute (cardiac index 2.9 L per minute per meter sq)   IMPRESSIONS:  Moderate (possibly severe, eccentric) mitral insufficiency. Normal coronary arteries. Normal hemodynamics.  RECOMMENDATION:  Monitor closely, will eventually require referral for MV repair.    Implants    Name ID Temporary Type Supply   No information to display  PACS Images    Show images for Cardiac catheterization     Link to Procedure Log    Procedure Log      Hemo Data       Most Recent Value   Fick Cardiac Output  5.01 L/min   Fick Cardiac Output Index  2.91 (L/min)/BSA   RA A Wave  6 mmHg   RA V Wave  1 mmHg   RA Mean  3 mmHg   RV Systolic Pressure  25 mmHg   RV Diastolic Pressure  2 mmHg   RV EDP  5 mmHg   PA Systolic Pressure  23 mmHg   PA Diastolic Pressure  6 mmHg   PA Mean  14 mmHg   PW A Wave  7 mmHg   PW V Wave  7 mmHg   PW Mean  5 mmHg   AO Systolic Pressure  108 mmHg   AO Diastolic Pressure  68 mmHg   AO Mean  87 mmHg   LV Systolic Pressure  107 mmHg   LV Diastolic Pressure  2 mmHg   LV EDP  8 mmHg   Arterial Occlusion Pressure Extended Systolic Pressure  107 mmHg   Arterial Occlusion Pressure Extended Diastolic Pressure  63 mmHg   Arterial Occlusion Pressure Extended Mean Pressure  83 mmHg   Left Ventricular Apex Extended Systolic Pressure  104 mmHg   Left Ventricular Apex Extended Diastolic Pressure  0 mmHg   Left Ventricular Apex Extended EDP Pressure  7 mmHg   QP/QS  1   TPVR Index  4.81 HRUI   TSVR Index  29.9 HRUI   PVR SVR Ratio  0.11   TPVR/TSVR Ratio  0.16     Impression:  Patient has stage D severe symptomatically primary mitral regurgitation. I personally reviewed the patient's recent transthoracic and transesophageal echocardiograms and diagnostic cardiac catheterization. The patient has mitral valve prolapse with a large segment of the middle scallop (P2) of the posterior leaflet  with severe mitral regurgitation. The jet of regurgitation courses eccentrically around the anterior surface of the left atrium. Left ventricular size and systolic function remains normal. The patient does not have significant coronary artery disease. Pulmonary artery pressures remain normal. I feel there is a very high likelihood that her valve should be repairable with relatively low associated risk.   Plan:  The patient and her husband were counseled at length regarding the indications, risks and potential benefits of mitral valve repair.  The rationale for elective surgery has been explained, including a comparison between surgery and continued medical therapy with close follow-up.  The likelihood of successful and durable valve repair has been discussed with particular reference to the findings of their recent echocardiogram.  Based upon these findings and previous experience, I have quoted them a greater than 95 percent likelihood of successful valve repair.  The patient understands and accepts all potential risks of surgery including but not limited to risk of death, stroke or other neurologic complication, myocardial infarction, congestive heart failure, respiratory failure, renal failure, bleeding requiring transfusion and/or reexploration, arrhythmia, infection or other wound complications, pneumonia, pleural and/or pericardial effusion, pulmonary embolus, aortic dissection or other major vascular complication, or delayed complications related to valve repair or replacement including but not limited to structural valve deterioration and failure, thrombosis, embolization, endocarditis, or paravalvular leak.  Alternative surgical approaches have been discussed including a comparison between conventional sternotomy and minimally-invasive techniques.  The relative risks and benefits of each have been reviewed as  they pertain to the patient's specific circumstances, and all of their questions have been  addressed.  Specific risks potentially related to the minimally-invasive approach were discussed at length, including but not limited to risk of conversion to full or partial sternotomy, aortic dissection or other major vascular complication, unilateral acute lung injury or pulmonary edema, phrenic nerve dysfunction or paralysis, rib fracture, chronic pain, lung hernia, or lymphocele. All of their questions have been answered.  We tentatively plan to proceed with surgery on Wednesday, 02/21/2016. The patient will return for follow-up on Monday, 02/19/2016 prior to surgery. She has been given a prescription for amiodarone to begin 7 days prior to surgery to decrease her risk of perioperative atrial dysrhythmias.   I spent in excess of 90 minutes during the conduct of this office consultation and >50% of this time involved direct face-to-face encounter with the patient for counseling and/or coordination of their care.    Salvatore Decent. Cornelius Moras, MD 01/22/2016 11:27 AM

## 2016-01-29 ENCOUNTER — Telehealth: Payer: Self-pay | Admitting: Thoracic Surgery (Cardiothoracic Vascular Surgery)

## 2016-01-29 ENCOUNTER — Encounter: Payer: Self-pay | Admitting: Thoracic Surgery (Cardiothoracic Vascular Surgery)

## 2016-01-29 ENCOUNTER — Ambulatory Visit
Admission: RE | Admit: 2016-01-29 | Discharge: 2016-01-29 | Disposition: A | Discharge status: Home / Self Care | Payer: BC Managed Care – PPO | Source: Ambulatory Visit | Attending: Thoracic Surgery (Cardiothoracic Vascular Surgery) | Admitting: Thoracic Surgery (Cardiothoracic Vascular Surgery)

## 2016-01-29 ENCOUNTER — Encounter (HOSPITAL_COMMUNITY): Payer: Self-pay | Admitting: Anesthesiology

## 2016-01-29 DIAGNOSIS — I7409 Other arterial embolism and thrombosis of abdominal aorta: Secondary | ICD-10-CM

## 2016-01-29 DIAGNOSIS — I712 Thoracic aortic aneurysm, without rupture, unspecified: Secondary | ICD-10-CM

## 2016-01-29 DIAGNOSIS — K8689 Other specified diseases of pancreas: Secondary | ICD-10-CM

## 2016-01-29 DIAGNOSIS — E041 Nontoxic single thyroid nodule: Secondary | ICD-10-CM

## 2016-01-29 HISTORY — DX: Nontoxic single thyroid nodule: E04.1

## 2016-01-29 HISTORY — DX: Other specified diseases of pancreas: K86.89

## 2016-01-29 MED ORDER — IOPAMIDOL (ISOVUE-370) INJECTION 76%
75.0000 mL | Freq: Once | INTRAVENOUS | Status: AC | PRN
  Administered 2016-01-29: 75 mL via INTRAVENOUS

## 2016-01-29 NOTE — Telephone Encounter (Signed)
Called to discuss results of CT angiogram performed earlier today. 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 We plan to obtain dedicated pancreas protocol MRI as soon as practical. We will also obtain TSH level, free T3 and free T4 levels with the patient's routine preoperative blood work. We will tentatively plan to proceed with surgery as previously scheduled depending upon results of the MRI.  All questions answered.  Purcell Nails, MD 01/29/2016 5:45 PM

## 2016-01-30 ENCOUNTER — Other Ambulatory Visit: Payer: Self-pay | Admitting: *Deleted

## 2016-01-30 DIAGNOSIS — I340 Nonrheumatic mitral (valve) insufficiency: Secondary | ICD-10-CM

## 2016-01-31 ENCOUNTER — Other Ambulatory Visit: Payer: Self-pay | Admitting: *Deleted

## 2016-01-31 DIAGNOSIS — K8689 Other specified diseases of pancreas: Secondary | ICD-10-CM

## 2016-02-09 ENCOUNTER — Ambulatory Visit
Admission: RE | Admit: 2016-02-09 | Discharge: 2016-02-09 | Disposition: A | Discharge status: Home / Self Care | Payer: BC Managed Care – PPO | Source: Ambulatory Visit | Attending: Thoracic Surgery (Cardiothoracic Vascular Surgery) | Admitting: Thoracic Surgery (Cardiothoracic Vascular Surgery)

## 2016-02-09 DIAGNOSIS — K8689 Other specified diseases of pancreas: Secondary | ICD-10-CM

## 2016-02-09 MED ORDER — GADOBENATE DIMEGLUMINE 529 MG/ML IV SOLN
13.0000 mL | Freq: Once | INTRAVENOUS | Status: AC | PRN
  Administered 2016-02-09: 13 mL via INTRAVENOUS

## 2016-02-12 ENCOUNTER — Telehealth: Payer: Self-pay | Admitting: Thoracic Surgery (Cardiothoracic Vascular Surgery)

## 2016-02-12 NOTE — Telephone Encounter (Signed)
Discussed results of MRI with the patient and her husband via telephone.  She has been scheduled to see Dr. Ewing Schlein at [facility] tomorrow.  Will plan to postpone mitral valve repair until her pancreatic mass has been evaluated further and a long term treatment plan has been established.  Purcell Nails, MD 02/12/2016 5:55 PM

## 2016-02-13 ENCOUNTER — Encounter (HOSPITAL_COMMUNITY): Payer: Self-pay | Admitting: *Deleted

## 2016-02-13 ENCOUNTER — Other Ambulatory Visit: Payer: Self-pay | Admitting: *Deleted

## 2016-02-16 ENCOUNTER — Other Ambulatory Visit: Payer: Self-pay | Admitting: Gastroenterology

## 2016-02-19 ENCOUNTER — Encounter (HOSPITAL_COMMUNITY): Payer: BC Managed Care – PPO

## 2016-02-19 ENCOUNTER — Other Ambulatory Visit (HOSPITAL_COMMUNITY): Payer: BC Managed Care – PPO

## 2016-02-19 ENCOUNTER — Encounter: Payer: BC Managed Care – PPO | Admitting: Thoracic Surgery (Cardiothoracic Vascular Surgery)

## 2016-02-20 ENCOUNTER — Ambulatory Visit (HOSPITAL_COMMUNITY): Payer: BC Managed Care – PPO | Admitting: Anesthesiology

## 2016-02-20 ENCOUNTER — Ambulatory Visit (HOSPITAL_COMMUNITY)
Admission: RE | Admit: 2016-02-20 | Discharge: 2016-02-20 | Disposition: A | Discharge status: Home / Self Care | Payer: BC Managed Care – PPO | Source: Ambulatory Visit | Attending: Gastroenterology | Admitting: Gastroenterology

## 2016-02-20 ENCOUNTER — Encounter (HOSPITAL_COMMUNITY): Payer: Self-pay

## 2016-02-20 ENCOUNTER — Encounter (HOSPITAL_COMMUNITY)
Admission: RE | Disposition: A | Discharge status: Home / Self Care | Payer: Self-pay | Source: Ambulatory Visit | Attending: Gastroenterology

## 2016-02-20 DIAGNOSIS — K8689 Other specified diseases of pancreas: Secondary | ICD-10-CM | POA: Diagnosis not present

## 2016-02-20 DIAGNOSIS — I10 Essential (primary) hypertension: Secondary | ICD-10-CM | POA: Diagnosis not present

## 2016-02-20 DIAGNOSIS — I341 Nonrheumatic mitral (valve) prolapse: Secondary | ICD-10-CM | POA: Diagnosis not present

## 2016-02-20 DIAGNOSIS — K862 Cyst of pancreas: Secondary | ICD-10-CM | POA: Insufficient documentation

## 2016-02-20 DIAGNOSIS — F329 Major depressive disorder, single episode, unspecified: Secondary | ICD-10-CM | POA: Insufficient documentation

## 2016-02-20 DIAGNOSIS — Z791 Long term (current) use of non-steroidal anti-inflammatories (NSAID): Secondary | ICD-10-CM | POA: Insufficient documentation

## 2016-02-20 DIAGNOSIS — F419 Anxiety disorder, unspecified: Secondary | ICD-10-CM | POA: Diagnosis not present

## 2016-02-20 DIAGNOSIS — Z79899 Other long term (current) drug therapy: Secondary | ICD-10-CM | POA: Diagnosis not present

## 2016-02-20 DIAGNOSIS — E041 Nontoxic single thyroid nodule: Secondary | ICD-10-CM | POA: Insufficient documentation

## 2016-02-20 DIAGNOSIS — M1990 Unspecified osteoarthritis, unspecified site: Secondary | ICD-10-CM | POA: Diagnosis not present

## 2016-02-20 HISTORY — PX: EUS: SHX5427

## 2016-02-20 LAB — PANC CYST FLD ANLYS-PATHFNDR-TG

## 2016-02-20 SURGERY — ESOPHAGEAL ENDOSCOPIC ULTRASOUND (EUS) RADIAL
Anesthesia: Monitor Anesthesia Care

## 2016-02-20 MED ORDER — SODIUM CHLORIDE 0.9 % IV SOLN: INTRAVENOUS | Status: DC

## 2016-02-20 MED ORDER — PROPOFOL 10 MG/ML IV BOLUS
INTRAVENOUS | Status: AC
  Filled 2016-02-20: qty 20

## 2016-02-20 MED ORDER — OXYCODONE HCL 5 MG PO TABS: 5.0000 mg | ORAL_TABLET | Freq: Once | ORAL | Status: DC | PRN

## 2016-02-20 MED ORDER — OXYCODONE HCL 5 MG/5ML PO SOLN: 5.0000 mg | Freq: Once | ORAL | Status: DC | PRN

## 2016-02-20 MED ORDER — ONDANSETRON HCL 4 MG/2ML IJ SOLN
4.0000 mg | Freq: Four times a day (QID) | INTRAMUSCULAR | Status: AC | PRN
  Administered 2016-02-20: 4 mg via INTRAVENOUS

## 2016-02-20 MED ORDER — ONDANSETRON HCL 4 MG/2ML IJ SOLN
INTRAMUSCULAR | Status: AC
  Filled 2016-02-20: qty 2

## 2016-02-20 MED ORDER — PROPOFOL 500 MG/50ML IV EMUL
INTRAVENOUS | Status: DC | PRN
  Administered 2016-02-20: 30 mg via INTRAVENOUS
  Administered 2016-02-20 (×3): 50 mg via INTRAVENOUS

## 2016-02-20 MED ORDER — LACTATED RINGERS IV SOLN
INTRAVENOUS | Status: DC | PRN
  Administered 2016-02-20: 12:00:00 via INTRAVENOUS

## 2016-02-20 MED ORDER — CIPROFLOXACIN IN D5W 400 MG/200ML IV SOLN
INTRAVENOUS | Status: DC | PRN
  Administered 2016-02-20: 400 mg via INTRAVENOUS

## 2016-02-20 MED ORDER — CIPROFLOXACIN IN D5W 400 MG/200ML IV SOLN
INTRAVENOUS | Status: AC
  Filled 2016-02-20: qty 200

## 2016-02-20 MED ORDER — PROPOFOL 500 MG/50ML IV EMUL
INTRAVENOUS | Status: DC | PRN
  Administered 2016-02-20: 150 ug/kg/min via INTRAVENOUS

## 2016-02-20 MED ORDER — FENTANYL CITRATE (PF) 100 MCG/2ML IJ SOLN: 25.0000 ug | INTRAMUSCULAR | Status: DC | PRN

## 2016-02-20 NOTE — Anesthesia Postprocedure Evaluation (Signed)
Anesthesia Post Note  Patient: Becky Murphy  Procedure(s) Performed: Procedure(s) (LRB): ESOPHAGEAL ENDOSCOPIC ULTRASOUND (EUS) RADIAL (N/A)  Patient location during evaluation: PACU Anesthesia Type: MAC Level of consciousness: awake and alert Pain management: pain level controlled Vital Signs Assessment: post-procedure vital signs reviewed and stable Respiratory status: spontaneous breathing, nonlabored ventilation, respiratory function stable and patient connected to nasal cannula oxygen Cardiovascular status: stable and blood pressure returned to baseline Anesthetic complications: no    Last Vitals:  Filed Vitals:   02/20/16 1250 02/20/16 1300  BP: 116/90   Pulse: 74 66  Temp:    Resp: 19 17    Last Pain:  Filed Vitals:   02/20/16 1301  PainSc: 1                  [name] S

## 2016-02-20 NOTE — Anesthesia Preprocedure Evaluation (Addendum)
Anesthesia Evaluation  Patient identified by MRN, date of birth, ID band Patient awake    Reviewed: Allergy & Precautions, H&P , NPO status , Patient's Chart, lab work & pertinent test results  Airway Mallampati: II   Neck ROM: full    Dental   Pulmonary neg pulmonary ROS, shortness of breath,    breath sounds clear to auscultation       Cardiovascular + Valvular Problems/Murmurs MR  Rhythm:regular Rate:Normal  Pt is SOB with exertion.  Related to MR.  Anticipates a mitral valve repair surgery in near future.   Neuro/Psych  Headaches, Anxiety Depression    GI/Hepatic   Endo/Other    Renal/GU      Musculoskeletal  (+) Arthritis ,   Abdominal   Peds  Hematology   Anesthesia Other Findings   Reproductive/Obstetrics                            Anesthesia Physical Anesthesia Plan  ASA: III  Anesthesia Plan: MAC   Post-op Pain Management:    Induction: Intravenous  Airway Management Planned: Simple Face Mask  Additional Equipment:   Intra-op Plan:   Post-operative Plan:   Informed Consent: I have reviewed the patients History and Physical, chart, labs and discussed the procedure including the risks, benefits and alternatives for the proposed anesthesia with the patient or authorized representative who has indicated his/her understanding and acceptance.     Plan Discussed with: CRNA, Anesthesiologist and Surgeon  Anesthesia Plan Comments:         Anesthesia Quick Evaluation

## 2016-02-20 NOTE — Transfer of Care (Signed)
Immediate Anesthesia Transfer of Care Note  Patient: Becky Murphy  Procedure(s) Performed: Procedure(s): ESOPHAGEAL ENDOSCOPIC ULTRASOUND (EUS) RADIAL (N/A)  Patient Location: PACU  Anesthesia Type:MAC  Level of Consciousness:  sedated, patient cooperative and responds to stimulation  Airway & Oxygen Therapy:Patient Spontanous Breathing and Patient connected to face mask oxgen  Post-op Assessment:  Report given to PACU RN and Post -op Vital signs reviewed and stable  Post vital signs:  Reviewed and stable  Last Vitals:  Filed Vitals:   02/20/16 1135 02/20/16 1242  BP: 131/85 118/79  Pulse: 65 82  Temp: 36.8 C 37 C  Resp: 14 19    Complications: No apparent anesthesia complications

## 2016-02-20 NOTE — Discharge Instructions (Signed)
Gastrointestinal Endoscopy, Care After °Refer to this sheet in the next few weeks. These instructions provide you with information on caring for yourself after your procedure. Your caregiver may also give you more specific instructions. Your treatment has been planned according to current medical practices, but problems sometimes occur. Call your caregiver if you have any problems or questions after your procedure. °HOME CARE INSTRUCTIONS °· If you were given medicine to help you relax (sedative), do not drive, operate machinery, or sign important documents for 24 hours. °· Avoid alcohol and hot or warm beverages for the first 24 hours after the procedure. °· Only take over-the-counter or prescription medicines for pain, discomfort, or fever as directed by your caregiver. You may resume taking your normal medicines unless your caregiver tells you otherwise. Ask your caregiver when you may resume taking medicines that may cause bleeding, such as aspirin, clopidogrel, or warfarin. °· You may return to your normal diet and activities on the day after your procedure, or as directed by your caregiver. Walking may help to reduce any bloated feeling in your abdomen. °· Drink enough fluids to keep your urine clear or pale yellow. °· You may gargle with salt water if you have a sore throat. °SEEK IMMEDIATE MEDICAL CARE IF: °· You have severe nausea or vomiting. °· You have severe abdominal pain, abdominal cramps that last longer than 6 hours, or abdominal swelling (distention). °· You have severe shoulder or back pain. °· You have trouble swallowing. °· You have shortness of breath, your breathing is shallow, or you are breathing faster than normal. °· You have a fever or a rapid heartbeat. °· You vomit blood or material that looks like coffee grounds. °· You have bloody, black, or tarry stools. °MAKE SURE YOU: °· Understand these instructions. °· Will watch your condition. °· Will get help right away if you are not doing  well or get worse. °  °This information is not intended to replace advice given to you by your health care provider. Make sure you discuss any questions you have with your health care provider. °  °Document Released: 07/30/2004 Document Revised: 01/06/2015 Document Reviewed: 03/17/2012 °Elsevier Interactive Patient Education ©2016 [employer] ° °

## 2016-02-20 NOTE — Interval H&P Note (Signed)
History and Physical Interval Note:  02/20/2016 11:52 AM  Becky Murphy  has presented today for surgery, with the diagnosis of abnormal x-ray  The various methods of treatment have been discussed with the patient and family. After consideration of risks, benefits and other options for treatment, the patient has consented to  Procedure(s): ESOPHAGEAL ENDOSCOPIC ULTRASOUND (EUS) RADIAL (N/A) as a surgical intervention .  The patient's history has been reviewed, patient examined, no change in status, stable for surgery.  I have reviewed the patient's chart and labs.  Questions were answered to the patient's satisfaction.     Becky Murphy M  Assessment:  1.  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  Plan:  1.  Endoscopic ultrasound with anticipated cyst aspiration and biopsy (FNA, fine needle aspiration) of solid lesion. 2.  Risks (bleeding, infection, bowel perforation that could require surgery, sedation-related changes in cardiopulmonary systems), benefits (identification and possible treatment of source of symptoms, exclusion of certain causes of symptoms), and alternatives (watchful waiting, radiographic imaging studies, empiric medical treatment) of upper endoscopy with ultrasound and likely biopsies (EUS +/- FNA) were explained to patient/family in detail and patient wishes to proceed.

## 2016-02-20 NOTE — Op Note (Signed)
[facility] [address] [place], [address]   ENDOSCOPIC ULTRASOUND PROCEDURE REPORT  PATIENT: Becky Murphy, Becky Murphy  MR#: [id number] BIRTHDATE: September 23, 1951  GENDER: female ENDOSCOPIST: Willis Modena, MD REFERRED BY:  Vida Rigger, M.D. PROCEDURE DATE:  02/20/2016 PROCEDURE:   Upper EUS w/FNA ASA CLASS:      Class III INDICATIONS:   1.  pancreatic mass, pancreatic cyst. MEDICATIONS: Monitored anesthesia care; ciprofloxacin 400 mg IV  DESCRIPTION OF PROCEDURE:   After the risks benefits and alternatives of the procedure were  explained, informed consent was obtained. The patient was then placed in the left, lateral, decubitus postion and IV sedation was administered. Throughout the procedure, the patients blood pressure, pulse and oxygen saturations were monitored continuously.  Under direct visualization, the Pentax EUS Linear [id number]  endoscope was introduced through the mouth  and advanced to the duodenal bulb      .  Water was used as necessary to provide an acoustic interface. Estimated blood loss is zero unless otherwise noted in this procedure report. Upon completion of the imaging, water was removed and the patient was sent to the recovery room in satisfactory condition.    FINDINGS:      20mm x 30mm unilocular cyst with somewhat thickened wall noted at junction of body/tail of pancreas; no clear communication of cyst with pancreatic duct was identified.  Cyst punctured with 22-g needle and several mLs of brown fluid were aspirated.  A second lesion seen in the neck of the pancreas, about 20mm x 25mm in size, isoechoic, relatively well-defined, microcystic and hypervascular in nature; cyst punctured with separate 25g needle and cytologic biopsies taken x 2 for cell block analysis.  No other lesions noted in pancreas.  No pancreatic or biliary ductal dilatation. Incidental notation made of gallstones.   IMPRESSION:     As above.  Two discrete pancreatic  lesions.  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  RECOMMENDATIONS:     1.  Watch for potential complications of procedure. 2.  Await cyst fluid results and separate cytology results (of two separate lesions). 3.  Ciprofloxacin 500 mg po bid x 5 days. 4.  Will discuss with Dr. Ewing Schlein.   _______________________________ Rosalie DoctorWillis Modena, MD 02/20/2016 12:56 PM   CC:

## 2016-02-20 NOTE — H&P (View-Only) (Signed)
     [address]       [place] [address]             [phone]     CARDIOTHORACIC SURGERY CONSULTATION REPORT  Referring Provider is Croitoru, Mihai, MD PCP is ELKINS,WILSON OLIVER, MD  Chief Complaint  Patient presents with  . Mitral Regurgitation    prolapse, insuff...ECHO 11/03/15, TEE 01/05/16    HPI:  Patient is a 64-year-old female with recently discovered mitral valve prolapse who has been referred for surgical consultation to discuss treatment options for management of severe symptomatic mitral regurgitation.  Patient states that she has been relatively healthy and physically active for the majority of her adult life. Last spring she first began to experience symptoms of exertional shortness of breath, fatigue, and atypical chest discomfort. She began to experience intermittent dizzy spells.  She presented to the hospital acutely in early November with atypical chest discomfort, shortness of breath, and dizziness.  EKG revealed normal sinus rhythm without ischemic changes and the patient ruled out for acute myocardial infarction based on serial cardiac enzymes. She was noted to have a systolic murmur on physical exam and transthoracic echocardiogram revealed mitral valve prolapse with mild to moderate mitral regurgitation. The patient was seen in follow-up by Dr. Croitoru who noted that the patient had a fairly prominent murmur on physical exam and symptoms suggestive of symptomatic mitral valve disease. Transesophageal echocardiogram was recommended.  TEE performed 01/05/2016 confirmed the presence of holosystolic prolapse of a large segment of the posterior leaflet of the mitral valve with severe mitral regurgitation, with an eccentric jet coursing anteriorly around the left atrium. Left ventricular size and systolic function remains normal. The patient subsequently underwent left and right heart catheterization on 01/16/2016 revealed normal coronary artery anatomy  with no significant coronary artery disease. Pulmonary artery pressures were normal. The patient was referred for surgical consultation.  The patient is married and lives with her husband locally in [place].  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 She has limited physically active life and [place] and [place]. She has mild arthritis but otherwise reports no significant physical limitations. She describes progressive symptoms of exertional shortness of breath over the last 6-9 months consistent with chronic diastolic congestive heart failure, New York Heart Association functional class IIB-III.  She states that she occasionally gets short of breath at night when she tries to sleep and she cannot lay flat in bed. She states that her breathing is improved when she sits upright. She has had frequent palpitations and dizzy spells without syncope. She has not had lower extremity edema. She describes atypical pressure-like tightness across her upper chest that is worse when she is feeling short of breath. She now gets short of breath with relatively mild activity. Her energy level has decreased considerably. She has not had fevers or chills. She reports recent development of a dry nonproductive cough.  Past Medical History  Diagnosis Date  . Anxiety   . Depression   . History of peptic ulcer "late 1970's"  . Arthritis     "back?" (11/03/2015)  . Migraine     "maybe 3-4/yr now" (11/03/2015)  . Mitral valve prolapse 12/26/2015  . 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 Exertional dyspnea 01/05/2016  . Chest pain 11/02/2015    Past Surgical History  Procedure Laterality Date  . Back surgery    .   Breast biopsy Left ~ 2005  . Breast lumpectomy Left ~ 2005  . Lumbar disc surgery  1992    "trimmed bulges off both sides"  . Tubal ligation    . Eye muscle surgery Bilateral 1970's?  . Tee without  cardioversion N/A 01/05/2016    Procedure: TRANSESOPHAGEAL ECHOCARDIOGRAM (TEE);  Surgeon: Mihai Croitoru, MD;  Location: [facility] ENDOS[facility];  Service: Cardiovascular;  Laterality: N/A;  . Cardiac catheterization N/A 01/16/2016    Procedure: Right/Left Heart Cath and Coronary Angiography;  Surgeon: Mihai Croitoru, MD;  Location: [facility] INVASIVE CV LAB;  Service: Cardiovascular;  Laterality: N/A;    Family History  Problem Relation Age of Onset  . Hypertension Mother   . Cancer Mother 85    MELANOMA  . Cancer Father     PROSTATE  . Cancer Sister 21    BREAST    Social History   Social History  . Marital Status: Married    Spouse Name: N/A  . Number of Children: N/A  . Years of Education: N/A   Occupational History  . Not on file.   Social History Main Topics  . Smoking status: Never Smoker   . Smokeless tobacco: Never Used  . Alcohol Use: 6.0 oz/week    6 Glasses of wine, 4 Shots of liquor per week  . Drug Use: No  . Sexual Activity: Not Currently   Other Topics Concern  . Not on file   Social History Narrative    Current Outpatient Prescriptions  Medication Sig Dispense Refill  . ALPRAZolam (XANAX) 0.5 MG tablet Take 0.5 mg by mouth 3 (three) times daily as needed for anxiety.    . diphenhydrAMINE (BENADRYL) 25 MG tablet Take 25 mg by mouth every 6 (six) hours as needed.    . FLUoxetine (PROZAC) 40 MG capsule Take 40 mg by mouth daily.    . ibuprofen (ADVIL,MOTRIN) 200 MG tablet Take 400 mg by mouth every 6 (six) hours as needed for moderate pain.     . metoprolol succinate (TOPROL-XL) 50 MG 24 hr tablet Take 50 mg by mouth daily. Take with or immediately following a meal.    . amiodarone (PACERONE) 200 MG tablet Take 1 tablet (200 mg total) by mouth daily. 60 tablet 0   No current facility-administered medications for this visit.    No Known Allergies    Review of Systems:   General:  normal appetite, decreased energy, no weight gain, no weight loss, no  fever  Cardiac:  + chest pain with exertion, no chest pain at rest, + SOB with exertion, occasional resting SOB, + PND, + orthopnea, + palpitations, no arrhythmia, no atrial fibrillation, no LE edema, + dizzy spells, no syncope  Respiratory:  + shortness of breath, no home oxygen, no productive cough, + dry cough, no bronchitis, no wheezing, no hemoptysis, no asthma, no pain with inspiration or cough, no sleep apnea, no CPAP at night  GI:   no difficulty swallowing, no reflux, no frequent heartburn, no hiatal hernia, no abdominal pain, no constipation, no diarrhea, no hematochezia, no hematemesis, no melena  GU:   no dysuria,  no frequency, no urinary tract infection, no hematuria, no kidney stones, no kidney disease  Vascular:  no pain suggestive of claudication, no pain in feet, + leg cramps, no varicose veins, no DVT, no non-healing foot ulcer  Neuro:   no stroke, no TIA's, no seizures, no headaches, no temporary blindness one eye,  no slurred speech, no peripheral neuropathy, no chronic   pain, no instability of gait, no memory/cognitive dysfunction  Musculoskeletal: + arthritis, no joint swelling, no myalgias, no difficulty walking, normal mobility   Skin:   no rash, no itching, no skin infections, no pressure sores or ulcerations  Psych:   no anxiety, no depression, no nervousness, no unusual recent stress  Eyes:   no blurry vision, no floaters, no recent vision changes, + wears glasses or contacts  ENT:   no hearing loss, no loose or painful teeth, no dentures, last saw dentist June 2016  Hematologic:  no easy bruising, no abnormal bleeding, no clotting disorder, no frequent epistaxis  Endocrine:  no diabetes, does not check CBG's at home     Physical Exam:   BP 118/84 mmHg  Pulse 58  Resp 16  Ht 5' 6" (1.676 m)  Wt 141 lb (63.957 kg)  BMI 22.77 kg/m2  SpO2 95%  General:    well-appearing  HEENT:  Unremarkable   Neck:   no JVD, no bruits, no adenopathy   Chest:   clear to  auscultation, symmetrical breath sounds, no wheezes, no rhonchi   CV:   RRR, grade IV/VI holosystolic murmur   Abdomen:  soft, non-tender, no masses   Extremities:  warm, well-perfused, pulses palpable, no LE edema  Rectal/GU  Deferred  Neuro:   Grossly non-focal and symmetrical throughout  Skin:   Clean and dry, no rashes, no breakdown   Diagnostic Tests:  Transthoracic Echocardiography  Patient:  Criscuolo, Miyoshi H MR #:    [id number] Study Date: 11/03/2015 Gender:   F Age:    64 Height:   162.6 cm Weight:   62.6 kg BSA:    1.69 m^2 Pt. Status: Room:    3W23C  ADMITTING  Ghimire, Shanker M ORDERING   Ghimire, Shanker M REFERRING  Ghimire, Shanker M ATTENDING  Schlossman, Erin PERFORMING  [facility], Inpatient SONOGRAPHER Casey Kirkpatrick  cc:  ------------------------------------------------------------------- LV EF: 55% -  60%  ------------------------------------------------------------------- Indications:   Chest pain 786.51.  ------------------------------------------------------------------- History:  PMH: No prior cardiac history.  ------------------------------------------------------------------- Study Conclusions  - Left ventricle: The cavity size was normal. Wall thickness was normal. Systolic function was normal. The estimated ejection fraction was in the range of 55% to 60%. Wall motion was normal; there were no regional wall motion abnormalities. Left ventricular diastolic function parameters were normal. - Aortic valve: There was trivial regurgitation. - Mitral valve: Prolapse. Prolapse. There was mild to moderate regurgitation. Valve area by continuity equation (using LVOT flow): 2.8 cm^2. - Left atrium: The atrium was mildly dilated.  Transthoracic echocardiography. M-mode, complete 2D, spectral Doppler, and color Doppler. Birthdate: Patient birthdate: 06/12/1951. Age: Patient is 64 yr old.  Sex: Gender: female. BMI: 23.7 kg/m^2. Blood pressure:   115/75 Patient status: Inpatient. Study date: Study date: 11/03/2015. Study time: 10:47 AM. Location: Bedside.  -------------------------------------------------------------------  ------------------------------------------------------------------- Left ventricle: The cavity size was normal. Wall thickness was normal. Systolic function was normal. The estimated ejection fraction was in the range of 55% to 60%. Wall motion was normal; there were no regional wall motion abnormalities. The transmitral flow pattern was normal. The deceleration time of the early transmitral flow velocity was normal. The pulmonary vein flow pattern was normal. The tissue Doppler parameters were normal. Left ventricular diastolic function parameters were normal.  ------------------------------------------------------------------- Aortic valve:  Structurally normal valve. Trileaflet. Cusp separation was normal. Doppler: Transvalvular velocity was within the normal range. There was no stenosis. There was trivial regurgitation.  ------------------------------------------------------------------- Aorta: The aorta was normal,   not dilated, and non-diseased.  ------------------------------------------------------------------- Mitral valve:  Prolapse. There is prolapse of the posterior leaflet of the mitral valve. There is mild-moderate mitral regurgitation directed toward the atrial septum. Prolapse. Doppler: There was mild to moderate regurgitation.  Valve area by continuity equation (using LVOT flow): 2.8 cm^2. Indexed valve area by continuity equation (using LVOT flow): 1.66 cm^2/m^2. Mean gradient (D): 4 mm Hg. Peak gradient (D): 7 mm Hg.  ------------------------------------------------------------------- Left atrium: The atrium was mildly dilated.  ------------------------------------------------------------------- Right  ventricle: The cavity size was normal. Wall thickness was normal. Systolic function was normal.  ------------------------------------------------------------------- Pulmonic valve:  Structurally normal valve.  Cusp separation was normal. Doppler: Transvalvular velocity was within the normal range. There was no regurgitation.  ------------------------------------------------------------------- Tricuspid valve:  Normal thickness leaflets. Doppler: There was mild regurgitation.  ------------------------------------------------------------------- Right atrium: The atrium was at the upper limits of normal in size.  ------------------------------------------------------------------- Pericardium: There was no pericardial effusion.  ------------------------------------------------------------------- Systemic veins: Inferior vena cava: The vessel was normal in size. The respirophasic diameter changes were in the normal range (>= 50%), consistent with normal central venous pressure.  ------------------------------------------------------------------- Post procedure conclusions Ascending Aorta:  - The aorta was normal, not dilated, and non-diseased.  ------------------------------------------------------------------- Measurements  Left ventricle              Value     Reference LV ID, ED, PLAX chordal      (H)   53.1 mm    43 - 52 LV ID, ES, PLAX chordal          32.9 mm    23 - 38 LV fx shortening, PLAX chordal      38  %    >=29 LV PW thickness, ED            9.15 mm    --------- IVS/LV PW ratio, ED            0.99      <=1.3 Stroke volume, 2D             128  ml    --------- Stroke volume/bsa, 2D           76  ml/m^2  --------- LV ejection fraction, 1-p A4C       59  %    --------- LV end-diastolic volume, 2-p       81  ml     --------- LV end-systolic volume, 2-p        30  ml    --------- LV ejection fraction, 2-p         63  %    --------- Stroke volume, 2-p            51  ml    --------- LV end-diastolic volume/bsa, 2-p     48  ml/m^2  --------- LV end-systolic volume/bsa, 2-p      18  ml/m^2  --------- Stroke volume/bsa, 2-p          30.3 ml/m^2  --------- LV e&', lateral              9.36 cm/s   --------- LV E/e&', lateral             13.78     --------- LV e&', medial               8.59 cm/s   --------- LV E/e&', medial              15.02     --------- LV   e&', average              8.98 cm/s   --------- LV E/e&', average             14.37     ---------  Ventricular septum            Value     Reference IVS thickness, ED             9.1  mm    ---------  LVOT                   Value     Reference LVOT ID, S                23  mm    --------- LVOT area                 4.15 cm^2   --------- LVOT peak velocity, S           144  cm/s   --------- LVOT mean velocity, S           73.4 cm/s   --------- LVOT VTI, S                30.8 cm    --------- LVOT peak gradient, S           8   mm Hg  ---------  Aorta                   Value     Reference Aortic root ID, ED            32  mm    ---------  Left atrium                Value     Reference LA ID, A-P, ES              43  mm    --------- LA ID/bsa, A-P          (H)   2.55 cm/m^2  <=2.2 LA volume, S               82.2 ml    --------- LA volume/bsa, S             48.7 ml/m^2   --------- LA volume, ES, 1-p A4C          69.7 ml    --------- LA volume/bsa, ES, 1-p A4C        41.3 ml/m^2  --------- LA volume, ES, 1-p A2C          86.4 ml    --------- LA volume/bsa, ES, 1-p A2C        51.2 ml/m^2  ---------  Mitral valve               Value     Reference Mitral E-wave peak velocity        129  cm/s   --------- Mitral A-wave peak velocity        81.9 cm/s   --------- Mitral mean velocity, D          94.6 cm/s   --------- Mitral deceleration time         215  ms    150 - 230 Mitral mean gradient, D          4   mm Hg  --------- Mitral peak gradient, D          7     mm Hg  --------- Mitral E/A ratio, peak          1.6      --------- Mitral valve area, LVOT          2.8  cm^2   --------- continuity Mitral valve area/bsa, LVOT        1.66 cm^2/m^2 --------- continuity Mitral annulus VTI, D           45.7 cm    ---------  Pulmonary arteries            Value     Reference PA pressure, S, DP            28  mm Hg  <=30  Tricuspid valve              Value     Reference Tricuspid regurg peak velocity      252  cm/s   --------- Tricuspid peak RV-RA gradient       25  mm Hg  ---------  Systemic veins              Value     Reference Estimated CVP               3   mm Hg  ---------  Right ventricle              Value     Reference RV pressure, S, DP            28  mm Hg  <=30 RV s&', lateral, S             17.8 cm/s   ---------  Legend: (L) and (H) mark values outside specified reference range.  ------------------------------------------------------------------- Prepared and Electronically  Authenticated by  Thomas Brackbill, MD 2016-11-04T12:58:24   Transesophageal Echocardiography  Patient:  Malphrus, Caleigh H MR #:    [id number] Study Date: 01/05/2016 Gender:   F Age:    64 Height:   167.6 cm Weight:   64.1 kg BSA:    1.73 m^2 Pt. Status: Room:  ADMITTING  Mihai Croitoru, MD ATTENDING  Mihai Croitoru, MD ORDERING   Mihai Croitoru, MD PERFORMING  Mihai Croitoru, MD REFERRING  Mihai Croitoru, MD SONOGRAPHER Jimmy Reel, RDCS  cc:  ------------------------------------------------------------------- LV EF: 60% -  65%  ------------------------------------------------------------------- Indications:   Mitral valve prolapse 424.0. Mitral regurgitation 424.0.  ------------------------------------------------------------------- Study Conclusions  - Left ventricle: Systolic function was normal. The estimated ejection fraction was in the range of 60% to 65%. Wall motion was normal; there were no regional wall motion abnormalities. - Aortic valve: There was trivial regurgitation. - Mitral valve: Severe, holosystolicprolapse, involving the middle scallop of the posterior leaflet. There was moderate to severe regurgitation directed eccentrically and anteriorly. - Left atrium: No evidence of thrombus in the atrial cavity or appendage. - Right atrium: No evidence of thrombus in the atrial cavity or appendage.  Impressions:  - The sevrity of mitral insufficiency was likely underestimated on transthoracic echo due to the highly eccentric mitral insufficiency jet.  Diagnostic transesophageal echocardiography. 2D and color Doppler. Birthdate: Patient birthdate: 04/06/1951. Age: Patient is 64 yr old. Sex: Gender: female.  BMI: 22.8 kg/m^2. Blood pressure: 130/80 Patient status: Outpatient. Study date: Study date: 01/05/2016. Study time: 01:16  PM.  -------------------------------------------------------------------  ------------------------------------------------------------------- Left ventricle: Systolic function was normal. The estimated ejection fraction was in the range of 60% to 65%. Wall motion was normal; there were no regional wall motion abnormalities.  ------------------------------------------------------------------- Aortic valve:  Structurally   normal valve. Trileaflet; normal thickness leaflets. Cusp separation was normal. Doppler: There was trivial regurgitation.  ------------------------------------------------------------------- Aorta: There was no atheroma. There was no evidence for dissection. Aortic root: The aortic root was not dilated. Ascending aorta: The ascending aorta was normal in size. Aortic arch: The aortic arch was normal in size. Descending aorta: The descending aorta was normal in size.  ------------------------------------------------------------------- Mitral valve:  Mildly thickened leaflets . Mild myxomatous degeneration. Leaflet separation was normal. Severe, holosystolicprolapse, involving the middle scallop of the posterior leaflet. Doppler: There was moderate to severe regurgitation directed eccentrically and anteriorly.  ------------------------------------------------------------------- Left atrium: The atrium was normal in size. No evidence of thrombus in the atrial cavity or appendage. The appendage was morphologically a left appendage, multilobulated, and of normal size. Emptying velocity was normal.  ------------------------------------------------------------------- Right ventricle: The cavity size was normal. Wall thickness was normal. Systolic function was normal.  ------------------------------------------------------------------- Pulmonic valve:  Structurally normal  valve.  ------------------------------------------------------------------- Tricuspid valve:  Structurally normal valve.  Leaflet separation was normal. Doppler: There was mild regurgitation.  ------------------------------------------------------------------- Pulmonary artery:  The main pulmonary artery was normal-sized.  ------------------------------------------------------------------- Right atrium: The atrium was normal in size. No evidence of thrombus in the atrial cavity or appendage. The appendage was morphologically a right appendage.  ------------------------------------------------------------------- Pericardium: There was no pericardial effusion.  ------------------------------------------------------------------- Measurements  Mitral valve                Value    Reference Mitral maximal regurg velocity, PISA    495  cm/s --------- Mitral regurg VTI, PISA           157  cm  --------- Mitral ERO, PISA              0.68  cm^2 --------- Mitral regurg volume, PISA         107  ml  ---------  Pulmonary arteries             Value    Reference PA pressure, S, DP             26   mm Hg <=30  Tricuspid valve               Value    Reference Tricuspid regurg peak velocity       239.63 cm/s --------- Tricuspid peak RV-RA gradient        23   mm Hg --------- Tricuspid maximal regurg velocity,     239.63 cm/s --------- PISA  Systemic veins               Value    Reference Estimated CVP                3   mm Hg ---------  Right ventricle               Value    Reference RV pressure, S, DP             26   mm Hg <=30  Legend: (L) and (H) mark values outside specified reference  range.  ------------------------------------------------------------------- Prepared and Electronically Authenticated by  Mihai Croitoru, MD 2017-01-06T16:24:49   CARDIAC CATHETERIZATION Procedures    Right/Left Heart Cath and Coronary Angiography    Conclusion     Procedures performed:  1. Right and left heart catheterization  2. Selective coronary angiography  3. Left ventriculography   Reason for procedure:   Unexplained atypical chest pain Valvular heart disease  Procedure performed by: Mihai Croitoru, MD,   FACC  Complications: none   Estimated blood loss: less than 5 mL   History: 64 year old with moderate to severe mitral insufficiency due to P2 scallop holosystolic prolapse. Atypical chest pain.  Consent: The risks, benefits, and details of the procedure were explained to the patient. Risks including death, MI, stroke, bleeding, limb ischemia, renal failure and allergy were described and accepted by the patient. Informed written consent was obtained prior to proceeding.  Technique: The patient was brought to the cardiac catheterization laboratory in the fasting state. He was prepped and draped in the usual sterile fashion. Local anesthesia with 1% lidocaine was administered to the right wrist area. AA right antecubital IV was excganged for a short venous sheath, 5F. A 5F PA catheter was advanced to the main pulmonary artery, used to record pressures and PA oxygen saturation. Using the modified Seldinger technique a 5 French right radial artery sheath was introduced without difficulty. Under fluoroscopic guidance, using 5 French TIG and angled pigtail catheters, selective cannulation of the left coronary artery, right coronary artery and left ventricle were respectively performed. Several coronary angiograms in a variety of projections were recorded, as well as a left ventriculogram in the RAO projection. Left ventricular pressure and a pull back to the aorta were  recorded. No immediate complications occurred. At the end of the procedure, all catheters were removed. After the procedure, hemostasis will be achieved with a TR band.  Contrast used: 50 mL Omnipaque Medications: Versed 2 mg + Fentanyl 50 mcg IV, Heparin 3500 units IV, Verapamil 5 mg IV  Angiographic Findings:  1. The left main coronary artery is free of significant atherosclerosis and bifurcates in the usual fashion into the left anterior descending artery and a dominant left circumflex coronary artery.  2. The left anterior descending artery is a large vessel that reaches the apex and generates 2 major diagonal branches. There is evidence of no luminal irregularities and no calcification. No hemodynamically meaningful stenoses are seen. 3. The left circumflex coronary artery is a large-size vessel dominant vessel that generates 4 major oblique marginal arteries and the posterior descending artery. There is evidence of no luminal irregularities and no calcification. No hemodynamically meaningful stenoses are seen. 4. The right coronary artery is a small-size non-dominant vessel that generates RV branches.  5. The left ventricle is normal in size. The left ventricle systolic function is normalwith an estimated ejection fraction of 65-70%. Regional wall motion abnormalities are no seen. No left ventricular thrombus is seen. There is at least moderate mitral insufficiency (PVCs during injection make it harder to evaluate). The ascending aorta appears normal. There is no aortic valve stenosis by pullback. The left ventricular end-diastolic pressure is 7 mm Hg.    Hemodynamic findings:  Aortic pressure 108/68 (mean 87) mm Hg   Left ventricle 104/0 with end-diastolic pressure of 7 mm Hg  PA wedge pressure a wave 7, v wave 7 (mean 5) mm Hg  Pulmonary artery 23/6 (mean 14) mm Hg  Right ventricle 25/2 with an end-diastolic pressure of 3 mm Hg  Right atrium a wave 5, v wave 2 (mean 3) mm  Hg  Cardiac output is 5.0 L per minute (cardiac index 2.9 L per minute per meter sq)   IMPRESSIONS:  Moderate (possibly severe, eccentric) mitral insufficiency. Normal coronary arteries. Normal hemodynamics.  RECOMMENDATION:  Monitor closely, will eventually require referral for MV repair.    Implants    Name ID Temporary Type Supply   No information to display      PACS Images    Show images for Cardiac catheterization     Link to Procedure Log    Procedure Log      Hemo Data       Most Recent Value   Fick Cardiac Output  5.01 L/min   Fick Cardiac Output Index  2.91 (L/min)/BSA   RA A Wave  6 mmHg   RA V Wave  1 mmHg   RA Mean  3 mmHg   RV Systolic Pressure  25 mmHg   RV Diastolic Pressure  2 mmHg   RV EDP  5 mmHg   PA Systolic Pressure  23 mmHg   PA Diastolic Pressure  6 mmHg   PA Mean  14 mmHg   PW A Wave  7 mmHg   PW V Wave  7 mmHg   PW Mean  5 mmHg   AO Systolic Pressure  108 mmHg   AO Diastolic Pressure  68 mmHg   AO Mean  87 mmHg   LV Systolic Pressure  107 mmHg   LV Diastolic Pressure  2 mmHg   LV EDP  8 mmHg   Arterial Occlusion Pressure Extended Systolic Pressure  107 mmHg   Arterial Occlusion Pressure Extended Diastolic Pressure  63 mmHg   Arterial Occlusion Pressure Extended Mean Pressure  83 mmHg   Left Ventricular Apex Extended Systolic Pressure  104 mmHg   Left Ventricular Apex Extended Diastolic Pressure  0 mmHg   Left Ventricular Apex Extended EDP Pressure  7 mmHg   QP/QS  1   TPVR Index  4.81 HRUI   TSVR Index  29.9 HRUI   PVR SVR Ratio  0.11   TPVR/TSVR Ratio  0.16     Impression:  Patient has stage D severe symptomatically primary mitral regurgitation. I personally reviewed the patient's recent transthoracic and transesophageal echocardiograms and diagnostic cardiac catheterization. The patient has mitral valve prolapse with a large segment of the middle scallop (P2) of the posterior leaflet  with severe mitral regurgitation. The jet of regurgitation courses eccentrically around the anterior surface of the left atrium. Left ventricular size and systolic function remains normal. The patient does not have significant coronary artery disease. Pulmonary artery pressures remain normal. I feel there is a very high likelihood that her valve should be repairable with relatively low associated risk.   Plan:  The patient and her husband were counseled at length regarding the indications, risks and potential benefits of mitral valve repair.  The rationale for elective surgery has been explained, including a comparison between surgery and continued medical therapy with close follow-up.  The likelihood of successful and durable valve repair has been discussed with particular reference to the findings of their recent echocardiogram.  Based upon these findings and previous experience, I have quoted them a greater than 95 percent likelihood of successful valve repair.  The patient understands and accepts all potential risks of surgery including but not limited to risk of death, stroke or other neurologic complication, myocardial infarction, congestive heart failure, respiratory failure, renal failure, bleeding requiring transfusion and/or reexploration, arrhythmia, infection or other wound complications, pneumonia, pleural and/or pericardial effusion, pulmonary embolus, aortic dissection or other major vascular complication, or delayed complications related to valve repair or replacement including but not limited to structural valve deterioration and failure, thrombosis, embolization, endocarditis, or paravalvular leak.  Alternative surgical approaches have been discussed including a comparison between conventional sternotomy and minimally-invasive techniques.  The relative risks and benefits of each have been reviewed as   they pertain to the patient's specific circumstances, and all of their questions have been  addressed.  Specific risks potentially related to the minimally-invasive approach were discussed at length, including but not limited to risk of conversion to full or partial sternotomy, aortic dissection or other major vascular complication, unilateral acute lung injury or pulmonary edema, phrenic nerve dysfunction or paralysis, rib fracture, chronic pain, lung hernia, or lymphocele. All of their questions have been answered.  We tentatively plan to proceed with surgery on Wednesday, 02/21/2016. The patient will return for follow-up on Monday, 02/19/2016 prior to surgery. She has been given a prescription for amiodarone to begin 7 days prior to surgery to decrease her risk of perioperative atrial dysrhythmias.   I spent in excess of 90 minutes during the conduct of this office consultation and >50% of this time involved direct face-to-face encounter with the patient for counseling and/or coordination of their care.    Aiyden Lauderback H. Jahden Schara, MD 01/22/2016 11:27 AM   

## 2016-02-21 ENCOUNTER — Inpatient Hospital Stay (HOSPITAL_COMMUNITY)
Admission: RE | Admit: 2016-02-21 | Payer: BC Managed Care – PPO | Source: Ambulatory Visit | Admitting: Thoracic Surgery (Cardiothoracic Vascular Surgery)

## 2016-02-21 ENCOUNTER — Encounter (HOSPITAL_COMMUNITY): Payer: Self-pay | Admitting: Gastroenterology

## 2016-02-21 ENCOUNTER — Encounter (HOSPITAL_COMMUNITY): Admission: RE | Payer: Self-pay | Source: Ambulatory Visit

## 2016-02-21 SURGERY — REPAIR, MITRAL VALVE, MINIMALLY INVASIVE
Anesthesia: General | Site: Chest | Laterality: Right

## 2016-02-26 ENCOUNTER — Other Ambulatory Visit: Payer: Self-pay | Admitting: *Deleted

## 2016-02-26 DIAGNOSIS — I340 Nonrheumatic mitral (valve) insufficiency: Secondary | ICD-10-CM

## 2016-02-29 ENCOUNTER — Ambulatory Visit: Payer: BC Managed Care – PPO | Admitting: Thoracic Surgery (Cardiothoracic Vascular Surgery)

## 2016-03-04 ENCOUNTER — Ambulatory Visit (HOSPITAL_COMMUNITY)
Admission: RE | Admit: 2016-03-04 | Discharge: 2016-03-04 | Disposition: A | Discharge status: Home / Self Care | Payer: BC Managed Care – PPO | Source: Ambulatory Visit | Attending: Thoracic Surgery (Cardiothoracic Vascular Surgery) | Admitting: Thoracic Surgery (Cardiothoracic Vascular Surgery)

## 2016-03-04 ENCOUNTER — Encounter (HOSPITAL_COMMUNITY)
Admission: RE | Admit: 2016-03-04 | Discharge: 2016-03-04 | Disposition: A | Discharge status: Home / Self Care | Payer: BC Managed Care – PPO | Source: Ambulatory Visit | Attending: Thoracic Surgery (Cardiothoracic Vascular Surgery) | Admitting: Thoracic Surgery (Cardiothoracic Vascular Surgery)

## 2016-03-04 ENCOUNTER — Encounter: Payer: Self-pay | Admitting: Thoracic Surgery (Cardiothoracic Vascular Surgery)

## 2016-03-04 ENCOUNTER — Encounter (HOSPITAL_COMMUNITY): Payer: Self-pay

## 2016-03-04 ENCOUNTER — Telehealth: Payer: Self-pay | Admitting: Thoracic Surgery (Cardiothoracic Vascular Surgery)

## 2016-03-04 ENCOUNTER — Other Ambulatory Visit: Payer: Self-pay | Admitting: *Deleted

## 2016-03-04 ENCOUNTER — Ambulatory Visit
Admission: RE | Admit: 2016-03-04 | Discharge: 2016-03-04 | Disposition: A | Discharge status: Home / Self Care | Payer: BC Managed Care – PPO | Source: Ambulatory Visit | Attending: Thoracic Surgery (Cardiothoracic Vascular Surgery) | Admitting: Thoracic Surgery (Cardiothoracic Vascular Surgery)

## 2016-03-04 ENCOUNTER — Ambulatory Visit (INDEPENDENT_AMBULATORY_CARE_PROVIDER_SITE_OTHER): Payer: BC Managed Care – PPO | Admitting: Thoracic Surgery (Cardiothoracic Vascular Surgery)

## 2016-03-04 ENCOUNTER — Ambulatory Visit: Payer: BC Managed Care – PPO | Admitting: Thoracic Surgery (Cardiothoracic Vascular Surgery)

## 2016-03-04 VITALS — BP 132/84 | HR 80 | Temp 97.8°F | Resp 20 | Ht 66.0 in | Wt 144.9 lb

## 2016-03-04 VITALS — BP 136/96 | HR 75 | Resp 20 | Ht 66.0 in | Wt 140.0 lb

## 2016-03-04 DIAGNOSIS — Z0183 Encounter for blood typing: Secondary | ICD-10-CM | POA: Insufficient documentation

## 2016-03-04 DIAGNOSIS — Z01818 Encounter for other preprocedural examination: Secondary | ICD-10-CM | POA: Diagnosis present

## 2016-03-04 DIAGNOSIS — I340 Nonrheumatic mitral (valve) insufficiency: Secondary | ICD-10-CM

## 2016-03-04 DIAGNOSIS — I341 Nonrheumatic mitral (valve) prolapse: Secondary | ICD-10-CM | POA: Diagnosis not present

## 2016-03-04 DIAGNOSIS — Z01812 Encounter for preprocedural laboratory examination: Secondary | ICD-10-CM | POA: Insufficient documentation

## 2016-03-04 DIAGNOSIS — K8689 Other specified diseases of pancreas: Secondary | ICD-10-CM

## 2016-03-04 HISTORY — DX: Other specified postprocedural states: Z98.890

## 2016-03-04 HISTORY — DX: Cardiac murmur, unspecified: R01.1

## 2016-03-04 HISTORY — DX: Other specified postprocedural states: R11.2

## 2016-03-04 LAB — BLOOD GAS, ARTERIAL
Acid-base deficit: 3.7 mmol/L — ABNORMAL HIGH (ref 0.0–2.0)
BICARBONATE: 19.7 meq/L — AB (ref 20.0–24.0)
DRAWN BY: 206361
FIO2: 0.21
O2 SAT: 94.9 %
Patient temperature: 98.6
TCO2: 20.7 mmol/L (ref 0–100)
pCO2 arterial: 29.7 mmHg — ABNORMAL LOW (ref 35.0–45.0)
pH, Arterial: 7.438 (ref 7.350–7.450)
pO2, Arterial: 87.9 mmHg (ref 80.0–100.0)

## 2016-03-04 LAB — COMPREHENSIVE METABOLIC PANEL
ALBUMIN: 4.3 g/dL (ref 3.5–5.0)
ALT: 12 U/L — ABNORMAL LOW (ref 14–54)
AST: 19 U/L (ref 15–41)
Alkaline Phosphatase: 60 U/L (ref 38–126)
Anion gap: 13 (ref 5–15)
BUN: 13 mg/dL (ref 6–20)
CHLORIDE: 110 mmol/L (ref 101–111)
CO2: 18 mmol/L — ABNORMAL LOW (ref 22–32)
Calcium: 9.6 mg/dL (ref 8.9–10.3)
Creatinine, Ser: 0.75 mg/dL (ref 0.44–1.00)
GFR calc Af Amer: >60 mL/min (ref >60)
GFR calc non Af Amer: >60 mL/min (ref >60)
GLUCOSE: 126 mg/dL — AB (ref 65–99)
POTASSIUM: 3.4 mmol/L — AB (ref 3.5–5.1)
Sodium: 141 mmol/L (ref 135–145)
Total Bilirubin: 1.1 mg/dL (ref 0.3–1.2)
Total Protein: 6.7 g/dL (ref 6.5–8.1)

## 2016-03-04 LAB — CBC
HCT: 46.4 % — ABNORMAL HIGH (ref 36.0–46.0)
Hemoglobin: 16.2 g/dL — ABNORMAL HIGH (ref 12.0–15.0)
MCH: 32 pg (ref 26.0–34.0)
MCHC: 34.9 g/dL (ref 30.0–36.0)
MCV: 91.5 fL (ref 78.0–100.0)
PLATELETS: 200 10*3/uL (ref 150–400)
RBC: 5.07 MIL/uL (ref 3.87–5.11)
RDW: 13.5 % (ref 11.5–15.5)
WBC: 7 10*3/uL (ref 4.0–10.5)

## 2016-03-04 LAB — URINALYSIS, ROUTINE W REFLEX MICROSCOPIC
Glucose, UA: NEGATIVE mg/dL
HGB URINE DIPSTICK: NEGATIVE
KETONES UR: 15 mg/dL — AB
Leukocytes, UA: NEGATIVE
NITRITE: NEGATIVE
PH: 5 (ref 5.0–8.0)
Protein, ur: NEGATIVE mg/dL
Specific Gravity, Urine: 1.018 (ref 1.005–1.030)

## 2016-03-04 LAB — SURGICAL PCR SCREEN
MRSA, PCR: NEGATIVE
Staphylococcus aureus: NEGATIVE

## 2016-03-04 LAB — PULMONARY FUNCTION TEST
DL/VA % PRED: 86 %
DL/VA: 4.34 ml/min/mmHg/L
DLCO UNC % PRED: 82 %
DLCO unc: 22.35 ml/min/mmHg
FEF 25-75 POST: 2.19 L/s
FEF 25-75 PRE: 2.09 L/s
FEF2575-%CHANGE-POST: 4 %
FEF2575-%PRED-POST: 94 %
FEF2575-%PRED-PRE: 90 %
FEV1-%Change-Post: 1 %
FEV1-%PRED-POST: 93 %
FEV1-%Pred-Pre: 92 %
FEV1-Post: 2.48 L
FEV1-Pre: 2.44 L
FEV1FVC-%CHANGE-POST: 3 %
FEV1FVC-%PRED-PRE: 99 %
FEV6-%CHANGE-POST: -1 %
FEV6-%Pred-Post: 93 %
FEV6-%Pred-Pre: 95 %
FEV6-Post: 3.1 L
FEV6-Pre: 3.16 L
FEV6FVC-%Change-Post: 0 %
FEV6FVC-%Pred-Post: 102 %
FEV6FVC-%Pred-Pre: 103 %
FVC-%CHANGE-POST: -1 %
FVC-%PRED-PRE: 92 %
FVC-%Pred-Post: 91 %
FVC-PRE: 3.19 L
FVC-Post: 3.14 L
POST FEV1/FVC RATIO: 79 %
POST FEV6/FVC RATIO: 99 %
PRE FEV1/FVC RATIO: 77 %
Pre FEV6/FVC Ratio: 99 %
RV % pred: 97 %
RV: 2.1 L
TLC % pred: 103 %
TLC: 5.54 L

## 2016-03-04 LAB — TYPE AND SCREEN
ABO/RH(D): O POS
ANTIBODY SCREEN: NEGATIVE

## 2016-03-04 LAB — APTT: APTT: 27 s (ref 24–37)

## 2016-03-04 LAB — PROTIME-INR
INR: 1.05 (ref 0.00–1.49)
PROTHROMBIN TIME: 13.9 s (ref 11.6–15.2)

## 2016-03-04 LAB — ABO/RH: ABO/RH(D): O POS

## 2016-03-04 MED ORDER — IOPAMIDOL (ISOVUE-300) INJECTION 61%
100.0000 mL | Freq: Once | INTRAVENOUS | Status: AC | PRN
  Administered 2016-03-04: 100 mL via INTRAVENOUS

## 2016-03-04 MED ORDER — ALBUTEROL SULFATE (2.5 MG/3ML) 0.083% IN NEBU
2.5000 mg | INHALATION_SOLUTION | Freq: Once | RESPIRATORY_TRACT | Status: AC
  Administered 2016-03-04: 2.5 mg via RESPIRATORY_TRACT

## 2016-03-04 NOTE — Progress Notes (Signed)
[address]       [place] [id number]             [phone]     CARDIOTHORACIC SURGERY OFFICE NOTE  Referring Provider is Croitoru, Rachelle HoraMihai, MD PCP is Kaleen MaskELKINS,WILSON OLIVER, MD   HPI:  Patient returns to the office today for follow-up of mitral valve prolapse with stage D severe symptomatic primary mitral regurgitation. She was originally seen in consultation on 01/22/2016 and plans were made for minimally invasive mitral valve repair. However, preoperative CT angiogram of the chest, abdomen, and pelvis revealed 2 cystic lesions in the pancreas. Follow-up MRI of the abdomen confirmed the presence of 2 separate hypodense masses in the pancreas. She was referred for GI consultation and underwent endoscopic ultrasound with needle aspiration biopsy by Dr. Dulce Sellarutlaw on 02/20/2016. Cytology from the aspirated fluid did not reveal any malignant cells, and the patient has been cleared to proceed with elective mitral valve repair. She returns to the office today with hopes to proceed with surgery later this week. She states that she has been having some abdominal discomfort ever since her biopsy was performed on 02/20/2016. She has still been eating and her bowel function is regular. She has not had fevers or chills. The pain in her abdomen is described as constant but has dissipated somewhat over the last 2 or 3 days. She was started on oral ciprofloxacin by Dr. Dulce Sellarutlaw the end of last week. She states that she otherwise feels well. She still has frequent palpitations. She does not have shortness of breath at rest but she will get short of breath with moderate level exertion. The remainder of her review of systems is unchanged from previously.   Current Outpatient Prescriptions  Medication Sig Dispense Refill  . ALPRAZolam (XANAX) 0.5 MG tablet Take 0.5 mg by mouth 3 (three) times daily as needed for anxiety.     Marland Kitchen. amiodarone (PACERONE) 200 MG tablet Take 200 mg by mouth daily.    Marland Kitchen.  aspirin 325 MG tablet Take 975 mg by mouth daily as needed for mild pain or headache.    . ciprofloxacin (CIPRO) 500 MG tablet Take 500 mg by mouth 2 (two) times daily.     Marland Kitchen. FLUoxetine (PROZAC) 40 MG capsule Take 40 mg by mouth daily.    Marland Kitchen. ibuprofen (ADVIL,MOTRIN) 200 MG tablet Take 400 mg by mouth every 6 (six) hours as needed for moderate pain.     No current facility-administered medications for this visit.      Physical Exam:   BP 136/96 mmHg  Pulse 75  Resp 20  Ht 5\' 6"  (1.676 m)  Wt 140 lb (63.504 kg)  BMI 22.61 kg/m2  SpO2 97%  General:  Well-appearing  Chest:   Clear to auscultation  CV:   Regular rate and rhythm with prominent systolic murmur heard along the left lower sternal border  Incisions:  n/a  Abdomen:  Soft, nondistended. Mild tenderness located primarily in the mid abdomen and left lower quadrant. There is no rebound tenderness or guarding.  Extremities:  Warm and well-perfused.  Diagnostic Tests:  CT ANGIOGRAPHY CHEST, ABDOMEN AND PELVIS  TECHNIQUE: Multidetector CT imaging through the chest, abdomen and pelvis was performed using the standard protocol during bolus administration of intravenous contrast. Multiplanar reconstructed images and MIPs were obtained and reviewed to evaluate the vascular anatomy.  CONTRAST: 75 mL Isovue 370 administered intravenously  COMPARISON: None.  FINDINGS: CTA CHEST FINDINGS  Mediastinum: Probable 2.4  cm enhancing mixed cystic and solid nodule arising from the inferior aspect of the left thyroid gland. The remainder of the thoracic inlet is unremarkable. No suspicious mediastinal adenopathy or mass. Unremarkable thoracic esophagus.  Heart/Vascular: Elongated thoracic aortic infundibulum and descending thoracic aorta. Conventional 3 vessel arch anatomy. The ascending thoracic aorta is ectatic at 3.6 cm in diameter but not truly aneurysmal. No evidence of aortic dissection. The majority of the heart is  within normal limits in size. There is dilatation of the left atrium and left atrial appendage. No evidence of left atrial appendage thrombus. No definite coronary artery calcifications within the limitations of this non cardiac gated examination. No pericardial fluid.  Lungs/Pleura: 2 mm subpleural nodule associated with the right major fissure (image 21 series 5). This has punctate attenuation centrally and is likely a small partially calcified granuloma. No further imaging follow-up is required. Otherwise, the lungs are clear. No pleural effusion.  Bones/Soft Tissues: No acute fracture or aggressive appearing lytic or blastic osseous lesion.  Review of the MIP images confirms the above findings.  CTA ABDOMEN AND PELVIS FINDINGS  VASCULAR  Aorta: Normal caliber abdominal aorta with trace calcified atherosclerotic plaque. No irregular fibro fatty pack or wall adherent mural thrombus.  Celiac: Widely patent and unremarkable. Conventional hepatic arterial anatomy. No evidence of visceral artery aneurysm.  SMA: Widely patent and unremarkable.  Renals: 2 renal arteries are present bilaterally. No evidence of significant stenosis or fibromuscular dysplasia.  IMA: Patent and unremarkable.  Inflow: Mildly tortuous iliac systems. The vessels are normal in caliber measuring 7-8 mm in diameter bilaterally. No evidence of dissection, aneurysm or stenosis.  Proximal Outflow: Within normal limits.  Veins: No focal venous abnormality.  NON-VASCULAR  Abdomen: Normal CT appearance of the stomach, duodenum and adrenal glands. Numerous macro lobulated low-attenuation lesions are present throughout the spleen with the largest measuring 3.2 cm in the medial aspect. Additionally, there is a circumscribed 3.1 cm low-attenuation cystic lesion arising from the body of the pancreas. 2.8 cm arterially enhancing rounded mass arising from the pancreatic neck. There is washout  on the delayed phase imaging.  2.8 cm simple cyst in the central aspect of the right liver and 1.5 cm simple cyst in hepatic segment 3. Gallstones layer within the gallbladder. No evidence of gallbladder wall thickening or pericholecystic fluid. No intra or extrahepatic biliary ductal dilatation.  The 5 mm nonobstructing stone in the interpolar right kidney. No enhancing renal mass. 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 No evidence of hydronephrosis or enhancing renal mass. Tiny sub cm low-attenuation lesions in the right kidney are too small to characterize but statistically highly likely benign cysts.  No evidence of obstruction or focal bowel wall thickening. Normal appendix in the right lower quadrant. The terminal ileum is unremarkable. No free fluid or suspicious adenopathy.  Pelvis: Unremarkable appearance of the at adnexa, uterus and bladder. No free fluid or suspicious adenopathy.  Bones/Soft Tissues: No acute fracture or aggressive appearing lytic or blastic osseous lesion. Small fat containing periumbilical hernia. L4-L5 and L5-S1 degenerative disc disease.  Review of the MIP images confirms the above findings.  IMPRESSION: VASCULAR  1. Minimal atherosclerotic plaque and vascular tortuosity without evidence of aneurysm, dissection or hemodynamically significant stenosis. 2. Tortuous thoracic and abdominal aorta. Mild ectasia of the ascending thoracic aorta to a maximal diameter of 3.6 cm. 3. Dilated left atrium and left atrial appendage consistent with the clinical  history of mitral valve regurgitation. NON VASCULAR  1. 2.8 cm arterially enhancing mass within the pancreatic neck. The mass demonstrates washout on delayed phase imaging. Differential considerations include pancreatic neuroendocrine tumor, pancreatic adenocarcinoma, solitary papillary epithelial neoplasm of  the pancreas, and other primary pancreatic neoplastic processes. Recommend further evaluation with dedicated pancreas protocol MRI and/or endoscopic ultrasound with biopsy. 2. A separate 3.1 cm circumscribed low-attenuation cystic lesion arises from the body of the pancreas. This may represent a pancreatic cyst, benign cystic neoplasm, branch duct IPMN or residual pseudocyst if the patient has a clinical history of prior pancreatitis. 3. Hepatic and renal simple cysts. 4. Low-attenuation macro lobular lesions throughout the spleen may represent mildly complex cysts, hemangiomas, or other benign entities. Malignant metastatic disease to the spleen is extremely unlikely in the absence of widespread metastatic disease. The structures could be further evaluated at the time of pancreas protocol MRI as recommended above. 5. 2.4 cm enhancing predominantly solid nodule arising from the inferior aspect of the left thyroid gland. Recommend further evaluation with dedicated thyroid ultrasound. 6. Nonobstructing right nephrolithiasis. 7. Small fat containing periumbilical hernia. 8. Lower lumbar degenerative disc disease. These results will be called to the ordering clinician or representative by the Radiologist Assistant, and communication documented in the PACS or zVision Dashboard.  Signed,  Sterling Big, MD  [facility]  [facility]   Electronically Signed  By: Malachy Moan M.D.  On: 01/29/2016 11:50   MRI ABDOMEN WITHOUT AND WITH CONTRAST  TECHNIQUE: Multiplanar multisequence MR imaging of the abdomen was performed both before and after the administration of intravenous contrast.  CONTRAST: 13mL MULTIHANCE GADOBENATE DIMEGLUMINE 529 MG/ML IV SOLN  COMPARISON: 01/29/2016 CT angiogram of the chest, abdomen and pelvis.  FINDINGS: Lower chest: Clear lung bases.  Hepatobiliary: Normal liver size and  configuration. No hepatic steatosis. There are six liver cysts scattered throughout the liver, some of which are simple and some of which are minimally complex with thin internal septations, several of which demonstrate lobulated contours, largest 2.7 cm in segment 8 of the right liver lobe. No suspicious liver masses. Gallbladder contains several layering gallstones measuring up to the 8 mm in size. No gallbladder wall thickening or pericholecystic fluid. No biliary ductal dilatation. Common bile duct diameter 5 mm. No choledocholithiasis.  Pancreas: No pancreas divisum. No main pancreatic duct dilation. There is a 2.3 x 2.3 x 2.6 cm mass in the pancreatic body (series 24/image 52), which demonstrates heterogeneous internal hypoenhancement and T2 hyperintensity and which appears rounded and slightly lobulated in contour. There is a separate round unilocular 2.9 x 2.8 x 2.8 cm cystic pancreatic lesion at the junction of the pancreatic body and tail (series 17/image 20), which demonstrates no internal septations, solid components or enhancement and which demonstrates a mildly thickened and slightly irregular enhancing wall.  Spleen: Normal size spleen. There are at least 5 similar-appearing cystic lesions scattered throughout the spleen demonstrating mildly lobulated outer contour and several thin internal septations with minimal septal enhancement and no solid enhancing components, in keeping with splenic lymphangiomas, largest 3.3 x 2.6 x 3.2 cm.  Adrenals/Urinary Tract: Normal adrenals. No hydronephrosis. 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 There are tiny simple cysts in the right kidney, largest 0.8 cm in  the upper right  kidney.  Stomach/Bowel: Grossly normal stomach. Visualized small and large bowel is normal caliber, with no bowel wall thickening.  Vascular/Lymphatic: Normal caliber abdominal aorta. Patent portal, splenic, hepatic and renal veins. No pathologically enlarged lymph nodes in the abdomen.  Other: No abdominal ascites or focal fluid collection.  Musculoskeletal: No aggressive appearing focal osseous lesions.  IMPRESSION: 1. Pancreatic body 2.6 cm hypoenhancing mass. Differential includes a pancreatic adenocarcinoma or pancreatic neuroendocrine tumor. The hypoenhancement is less typical for a pancreatic neuroendocrine tumor, although this diagnosis is favored given the relatively circumscribed appearance and absence of main pancreatic duct dilation, which would be atypical for a primary pancreatic adenocarcinoma of this size. 2. Separate 2.9 cm unilocular cystic pancreatic lesion at the junction of the pancreatic body and tail with mildly thickened and slightly irregular enhancing wall and no internal complexity. Differential includes a mucinous cystic pancreatic neoplasm or pancreatic pseudocyst. Endoscopic ultrasound-guided tissue sampling is advised for both of these pancreatic masses. 3. No evidence of metastatic disease in the abdomen. 4. Subcentimeter left renal angiomyolipoma. Simple tiny right renal cysts. 5. Splenic lymphangiomas. 6. Benign-appearing liver cysts. 7. Cholelithiasis.   Electronically Signed  By: Delbert Phenix M.D.  On: 02/09/2016 12:03    Impression:  Patient has mitral valve prolapse with stage D severe symptomatic primary mitral regurgitation. She remains clinically stable with symptoms of frequent palpitations, atypical chest discomfort, and shortness of breath with moderate level exertion. She still has some abdominal discomfort and mild tenderness on physical exam that began following her endoscopic ultrasound with transgastric needle  aspiration biopsy of her pancreatic cystic mass. This biopsy was performed nearly 2 weeks ago. She is not having fevers or chills. She does not have peritoneal signs on physical exam.  Plan:  I discussed options at length with the patient and her husband in the office today. Most incurred conservative approach would be to postpone elective mitral valve repair further until her abdominal discomfort has completely resolved. The patient is very anxious about her surgery and wants to proceed this week if possible. Under the circumstances I feel that we should obtain repeat CT scan or MRI of the abdomen to make sure that she does not have signs of pancreatic pseudocyst, abscess, or significant hematoma.  The patient and her husband were again counseled at length regarding the indications, risks and potential benefits of mitral valve repair.  The rationale for elective surgery has been explained, including a comparison between surgery and continued medical therapy with close follow-up.  The likelihood of successful and durable valve repair has been discussed with particular reference to the findings of their recent echocardiogram.  Based upon these findings and previous experience, I have quoted them a greater than 95 percent likelihood of successful valve repair. The patient understands and accepts all potential risks of surgery including but not limited to risk of death, stroke or other neurologic complication, myocardial infarction, congestive heart failure, respiratory failure, renal failure, bleeding requiring transfusion and/or reexploration, arrhythmia, infection or other wound complications, pneumonia, pleural and/or pericardial effusion, pulmonary embolus, aortic dissection or other major vascular complication, or delayed complications related to valve repair or replacement including but not limited to structural valve deterioration and failure, thrombosis, embolization, endocarditis, or paravalvular leak.   Alternative surgical approaches have been discussed including a comparison between conventional sternotomy and minimally-invasive techniques.  The relative risks and benefits of each have been reviewed as they pertain to the patient's specific circumstances, and all of their questions have  been addressed.  Specific risks potentially related to the minimally-invasive approach were discussed at length, including but not limited to risk of conversion to full or partial sternotomy, aortic dissection or other major vascular complication, unilateral acute lung injury or pulmonary edema, phrenic nerve dysfunction or paralysis, rib fracture, chronic pain, lung hernia, or lymphocele.   All of their questions have been answered.    I spent in excess of 30 minutes during the conduct of this office consultation and >50% of this time involved direct face-to-face encounter with the patient for counseling and/or coordination of their care.    Salvatore Decent. Cornelius Moras, MD 03/04/2016 10:22 AM

## 2016-03-04 NOTE — Patient Instructions (Signed)
   Patient should continue taking all medications without change through the day before surgery.  Patient should have nothing to eat or drink after midnight the night before surgery.  On the morning of surgery patient does not need to take any medications.

## 2016-03-04 NOTE — Pre-Procedure Instructions (Signed)
Becky Murphy  03/04/2016     Your procedure is scheduled on : Wednesday March 06, 2016 at 8:30 AM.  [facility]:30 AM.  Call this number if you have problems the morning of surgery: [phone]    Remember:  Do not eat food or drink liquids after midnight.  Take these medicines the morning of surgery with A SIP OF WATER : Alprazolam (Xanax) if needed, Amiodarone (Pacerone), Fluoxetine (Prozac)   Stop taking any vitamins, herbal medications/supplements, Ibuprofen, Advil, Motrin, Aleve, etc today   Do not wear jewelry, make-up or nail polish.  Do not wear lotions, powders, or perfumes.    Do not shave 48 hours prior to surgery.    Do not bring valuables to the hospital.  [facility] is not responsible for any belongings or valuables.  Contacts, dentures or bridgework may not be worn into surgery.  Leave your suitcase in the car.  After surgery it may be brought to your room.  For patients admitted to the hospital, discharge time will be determined by your treatment team.  Patients discharged the day of surgery will not be allowed to drive home.   Name and phone number of your driver:    Special instructions:  Shower using CHG soap the night before and the morning of your surgery  Please read over the following fact sheets that you were given. Pain Booklet, Coughing and Deep Breathing, Blood Transfusion Information, MRSA Information and Surgical Site Infection Prevention

## 2016-03-04 NOTE — Telephone Encounter (Signed)
Call to discuss results of CT scan of the abdomen performed earlier today. Patient was reassured that the CT scan appeared unchanged in comparison with previous scans. There is specifically was no sign of pancreatitis, abscess formation, pseudocyst, or hematoma following her recent biopsy. We plan to proceed with mitral valve repair later this week as originally scheduled.  All questions answered.  Purcell Nailslarence H Draylon Mercadel, MD 03/04/2016 6:07 PM

## 2016-03-04 NOTE — Progress Notes (Signed)
VASCULAR LAB PRELIMINARY  PRELIMINARY  PRELIMINARY  PRELIMINARY  Pre-op Cardiac Surgery  Carotid Findings:   No evidence of carotid stenosis bilaterally. Vertebral arteries demonstrates normal antegrade flow.    Upper Extremity Right Left  Brachial Pressures 134 139  Radial Waveforms WNL WNL  Ulnar Waveforms WNL Abnormal  Palmar Arch (Allen's Test)     Findings:  Right doppler waveforms remain normal with both radial and ulnar arteries compressions. Left doppler waveforms remain normal with radial compression and flattened with ulnar compression.     Lower  Extremity Right Left  Dorsalis Pedis    Anterior Tibial    Posterior Tibial    Ankle/Brachial Indices      Findings:     Chauncy LeanSturdivant, Alani Lacivita D, RVT 03/04/2016, 3:10 PM

## 2016-03-05 LAB — HEMOGLOBIN A1C
HEMOGLOBIN A1C: 5 % (ref 4.8–5.6)
Mean Plasma Glucose: 97 mg/dL

## 2016-03-05 MED ORDER — DEXMEDETOMIDINE HCL IN NACL 400 MCG/100ML IV SOLN
0.1000 ug/kg/h | INTRAVENOUS | Status: DC
Start: 2016-03-06 — End: 2016-03-06
  Filled 2016-03-05 (×2): qty 100

## 2016-03-05 MED ORDER — CHLORHEXIDINE GLUCONATE 0.12 % MT SOLN
15.0000 mL | Freq: Once | OROMUCOSAL | Status: AC
  Administered 2016-03-06: 15 mL via OROMUCOSAL
  Filled 2016-03-05: qty 15

## 2016-03-05 MED ORDER — SODIUM CHLORIDE 0.9 % IV SOLN
INTRAVENOUS | Status: DC
  Filled 2016-03-05 (×2): qty 2.5

## 2016-03-05 MED ORDER — DEXTROSE 5 % IV SOLN
0.0000 ug/min | INTRAVENOUS | Status: DC
  Filled 2016-03-05 (×2): qty 4

## 2016-03-05 MED ORDER — PHENYLEPHRINE HCL 10 MG/ML IJ SOLN
30.0000 ug/min | INTRAVENOUS | Status: DC
  Filled 2016-03-05 (×2): qty 2

## 2016-03-05 MED ORDER — VANCOMYCIN HCL 10 G IV SOLR
1250.0000 mg | INTRAVENOUS | Status: AC
  Administered 2016-03-06: 1250 mg via INTRAVENOUS
  Filled 2016-03-05 (×2): qty 1250

## 2016-03-05 MED ORDER — DEXTROSE 5 % IV SOLN
750.0000 mg | INTRAVENOUS | Status: DC
  Filled 2016-03-05 (×2): qty 750

## 2016-03-05 MED ORDER — NITROGLYCERIN IN D5W 200-5 MCG/ML-% IV SOLN
2.0000 ug/min | INTRAVENOUS | Status: DC
  Filled 2016-03-05 (×2): qty 250

## 2016-03-05 MED ORDER — MAGNESIUM SULFATE 50 % IJ SOLN
40.0000 meq | INTRAMUSCULAR | Status: DC
  Filled 2016-03-05 (×2): qty 10

## 2016-03-05 MED ORDER — METOPROLOL TARTRATE 12.5 MG HALF TABLET
12.5000 mg | ORAL_TABLET | Freq: Once | ORAL | Status: AC
  Administered 2016-03-06: 12.5 mg via ORAL
  Filled 2016-03-05: qty 1

## 2016-03-05 MED ORDER — PAPAVERINE HCL 30 MG/ML IJ SOLN
INTRAMUSCULAR | Status: AC
  Administered 2016-03-06: 100 mL
  Filled 2016-03-05 (×2): qty 2.5

## 2016-03-05 MED ORDER — POTASSIUM CHLORIDE 2 MEQ/ML IV SOLN
80.0000 meq | INTRAVENOUS | Status: DC
  Filled 2016-03-05 (×2): qty 40

## 2016-03-05 MED ORDER — SODIUM CHLORIDE 0.9 % IV SOLN
INTRAVENOUS | Status: DC
  Filled 2016-03-05 (×2): qty 30

## 2016-03-05 MED ORDER — DOPAMINE-DEXTROSE 3.2-5 MG/ML-% IV SOLN
0.0000 ug/kg/min | INTRAVENOUS | Status: DC
  Filled 2016-03-05: qty 250

## 2016-03-05 MED ORDER — CHLORHEXIDINE GLUCONATE 4 % EX LIQD: 30.0000 mL | CUTANEOUS | Status: DC

## 2016-03-05 MED ORDER — CEFUROXIME SODIUM 1.5 G IJ SOLR
1.5000 g | INTRAMUSCULAR | Status: AC
  Administered 2016-03-06: 750 g via INTRAVENOUS
  Administered 2016-03-06: 1.5 g via INTRAVENOUS
  Filled 2016-03-05 (×3): qty 1.5

## 2016-03-05 MED ORDER — SODIUM CHLORIDE 0.9 % IV SOLN
INTRAVENOUS | Status: DC
  Filled 2016-03-05 (×2): qty 40

## 2016-03-05 NOTE — H&P (Signed)
[address]       Becky Murphy [address]             [id number]          CARDIOTHORACIC SURGERY HISTORY AND PHYSICAL EXAM  Referring Provider is Croitoru, Rachelle Hora, Murphy PCP is Becky Murphy  Chief Complaint  Patient presents with  . Mitral Regurgitation    prolapse, insuff...ECHO 11/03/15, TEE 01/05/16    HPI:  Patient is a 65 year old female with recently discovered mitral valve prolapse who has been referred for surgical consultation to discuss treatment options for management of severe symptomatic mitral regurgitation. Patient states that she has been relatively healthy and physically active for the majority of her adult life. Last spring she first began to experience symptoms of exertional shortness of breath, fatigue, and atypical chest discomfort. She began to experience intermittent dizzy spells. She presented to the hospital acutely in early November with atypical chest discomfort, shortness of breath, and dizziness. EKG revealed normal sinus rhythm without ischemic changes and the patient ruled out for acute myocardial infarction based on serial cardiac enzymes. She was noted to have a systolic murmur on physical exam and transthoracic echocardiogram revealed mitral valve prolapse with mild to moderate mitral regurgitation. The patient was seen in follow-up by Becky Murphy who noted that the patient had a fairly prominent murmur on physical exam and symptoms suggestive of symptomatic mitral valve disease. Transesophageal echocardiogram was recommended. TEE performed 01/05/2016 confirmed the presence of holosystolic prolapse of a large segment of the posterior leaflet of the mitral valve with severe mitral regurgitation, with an eccentric jet coursing anteriorly around the left atrium. Left ventricular size and systolic function remains normal. The patient subsequently underwent left and right heart catheterization on 01/16/2016 revealed normal  coronary artery anatomy with no significant coronary artery disease. Pulmonary artery pressures were normal. The patient was referred for surgical consultation.  She was originally seen in consultation on 01/22/2016 and plans were made for minimally invasive mitral valve repair. However, preoperative CT angiogram of the chest, abdomen, and pelvis revealed 2 cystic lesions in the pancreas. Follow-up MRI of the abdomen confirmed the presence of 2 separate hypodense masses in the pancreas. She was referred for GI consultation and underwent endoscopic ultrasound with needle aspiration biopsy by Becky Murphy on 02/20/2016. Cytology from the aspirated fluid did not reveal any malignant cells, and the patient has been cleared to proceed with elective mitral valve repair. She returns to the office today with hopes to proceed with surgery later this week. She states that she has been having some abdominal discomfort ever since her biopsy was performed on 02/20/2016. She has still been eating and her bowel function is regular. She has not had fevers or chills. The pain in her abdomen is described as constant but has dissipated somewhat over the last 2 or 3 days. She was started on oral ciprofloxacin by Becky Murphy the end of last week. She states that she otherwise feels well. She still has frequent palpitations. She does not have shortness of breath at rest but she will get short of breath with moderate level exertion. The remainder of her review of systems is unchanged from previously.  The patient is married and lives with her husband locally in [place]. 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 She has limited physically active life and enjoys camping and traveling to their vacation properties at the [place] and in the mountains. She has  mild arthritis but otherwise reports no significant physical limitations. She describes progressive symptoms of exertional shortness of breath over the last 6-9 months  consistent with chronic diastolic congestive heart failure, New York Heart Association functional class IIB-III. She states that she occasionally gets short of breath at night when she tries to sleep and she cannot lay flat in bed. She states that her breathing is improved when she sits upright. She has had frequent palpitations and dizzy spells without syncope. She has not had lower extremity edema. She describes atypical pressure-like tightness across her upper chest that is worse when she is feeling short of breath. She now gets short of breath with relatively mild activity. Her energy level has decreased considerably. She has not had fevers or chills. She reports recent development of a dry nonproductive cough.           Past Medical History  Diagnosis Date  . Anxiety   . Depression   . History of peptic ulcer "late 1970's"  . Arthritis     "back?" (11/03/2015)  . Migraine     "maybe 3-4/yr now" (11/03/2015)  . Mitral valve prolapse 12/26/2015    symptomatic"MVP surgery postponed to get lesion of pancreas evaluated first".  . 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 Chest pain 11/02/2015  . Pancreatic mass 01/29/2016    2.8 cm enhancing mass in the pancreatic neck and 3.1 cm low-attenuation cystic lesion in body of pancreas noted on CT angiogram  . Thyroid nodule 01/29/2016    2.4 cm enhancing mixed cystic and solid nodule inferior left thyroid gland noted on CT angiogram  . PONV (postoperative nausea and vomiting)   . Heart murmur   . Exertional dyspnea 01/05/2016    Past Surgical History  Procedure Laterality Date  . Back surgery    . Lumbar disc surgery  1992    "trimmed bulges off both sides"  . Tubal ligation    . Tee without cardioversion N/A 01/05/2016    Procedure: TRANSESOPHAGEAL ECHOCARDIOGRAM (TEE);  Surgeon: Becky Fair, Murphy;  Location: [facility] ENDOSCOPY;  Service: Cardiovascular;  Laterality: N/A;  . Cardiac catheterization N/A 01/16/2016    Procedure:  Right/Left Heart Cath and Coronary Angiography;  Surgeon: Becky Fair, Murphy;  Location: [facility] INVASIVE CV LAB;  Service: Cardiovascular;  Laterality: N/A;  . Eus N/A 02/20/2016    Procedure: ESOPHAGEAL ENDOSCOPIC ULTRASOUND (EUS) RADIAL;  Surgeon: Willis Modena, Murphy;  Location: [facility] ENDOSCOPY;  Service: Endoscopy;  Laterality: N/A;  . Breast biopsy Left ~ 2005  . Breast lumpectomy Left ~ 2005  . Eye muscle surgery Bilateral 1970's?  . Colonoscopy      Family History  Problem Relation Age of Onset  . Hypertension Mother   . Cancer Mother 64    MELANOMA  . Cancer Father     PROSTATE  . Cancer Sister 21    BREAST    Social History Social History  Substance Use Topics  . Smoking status: Never Smoker   . Smokeless tobacco: Never Used  . Alcohol Use: 6.0 oz/week    6 Glasses of wine, 4 Shots of liquor per week     Comment: occasional    Prior to Admission medications   Medication Sig Start Date End Date Taking? Authorizing Provider  ALPRAZolam Prudy Feeler) 0.5 MG tablet Take 0.5 mg by mouth 3 (three) times daily as needed for anxiety.    Yes Historical Provider, Murphy  amiodarone (PACERONE) 200 MG tablet Take 200 mg by mouth daily.  01/22/16  Yes Historical Provider, Murphy  aspirin 325 MG tablet Take 975 mg by mouth daily as needed for mild pain or headache.   Yes Historical Provider, Murphy  FLUoxetine (PROZAC) 40 MG capsule Take 40 mg by mouth daily.   Yes Historical Provider, Murphy  ibuprofen (ADVIL,MOTRIN) 200 MG tablet Take 400 mg by mouth every 6 (six) hours as needed for moderate pain.   Yes Historical Provider, Murphy  ciprofloxacin (CIPRO) 500 MG tablet Take 500 mg by mouth 2 (two) times daily.  03/01/16   Historical Provider, Murphy    No Known Allergies   Review of Systems:  General:normal appetite, decreased energy, no weight gain, no weight loss, no fever Cardiac:+ chest pain with exertion, no chest pain at rest, + SOB with  exertion, occasional resting SOB, + PND, + orthopnea, + palpitations, no arrhythmia, no atrial fibrillation, no LE edema, + dizzy spells, no syncope Respiratory:+ shortness of breath, no home oxygen, no productive cough, + dry cough, no bronchitis, no wheezing, no hemoptysis, no asthma, no pain with inspiration or cough, no sleep apnea, no CPAP at night GI:no difficulty swallowing, no reflux, no frequent heartburn, no hiatal hernia, no abdominal pain, no constipation, no diarrhea, no hematochezia, no hematemesis, no melena GU:no dysuria, no frequency, no urinary tract infection, no hematuria, no kidney stones, no kidney disease Vascular:no pain suggestive of claudication, no pain in feet, + leg cramps, no varicose veins, no DVT, no non-healing foot ulcer Neuro:no stroke, no TIA's, no seizures, no headaches, no temporary blindness one eye, no slurred speech, no peripheral neuropathy, no chronic pain, no instability of gait, no memory/cognitive dysfunction Musculoskeletal:+ arthritis, no joint swelling, no myalgias, no difficulty walking, normal mobility  Skin:no rash, no itching, no skin infections, no pressure sores or ulcerations Psych:no anxiety, no depression, no nervousness, no unusual recent stress Eyes:no blurry vision, no floaters, no recent vision changes, + wears glasses or contacts ENT:no hearing loss, no loose or painful teeth, no dentures, last saw dentist June 2016 Hematologic:no easy bruising, no abnormal bleeding, no clotting disorder, no frequent epistaxis Endocrine:no  diabetes, does not check CBG's at home   Physical Exam:  BP 118/84 mmHg  Pulse 58  Resp 16  Ht 5\' 6"  (1.676 m)  Wt 141 lb (63.957 kg)  BMI 22.77 kg/m2  SpO2 95% General: well-appearing HEENT:Unremarkable  Neck:no JVD, no bruits, no adenopathy  Chest:clear to auscultation, symmetrical breath sounds, no wheezes, no rhonchi  CV:RRR, grade IV/VI holosystolic murmur  Abdomen:soft, non-tender, no masses  Extremities:warm, well-perfused, pulses palpable, no LE edema Rectal/GUDeferred Neuro:Grossly non-focal and symmetrical throughout Skin:Clean and dry, no rashes, no breakdown   Diagnostic Tests:  Transthoracic Echocardiography  Patient:  Becky Murphy, Becky Murphy MR #:    [id number] Study Date: 11/03/2015 Gender:   F Age:    33 Height:   162.6 cm Weight:   62.6 kg BSA:    1.69 m^2 Pt. Status: Room:    3W23C  ADMITTING  Ghimire, Shanker M Candy Sledge, Shanker M REFERRING  Ghimire, Shanker M ATTENDING  Alvira Monday PERFORMING  [facility], Inpatient SONOGRAPHER Thurman Coyer  cc:  ------------------------------------------------------------------- LV EF: 55% -  60%  ------------------------------------------------------------------- Indications:   Chest pain 786.51.  ------------------------------------------------------------------- History:  PMH: No prior cardiac history.  ------------------------------------------------------------------- Study Conclusions  - Left ventricle: The cavity size was normal. Wall thickness was normal.  Systolic function was normal. The estimated ejection fraction was in the range of 55% to 60%. Wall motion was normal; there  were no regional wall motion abnormalities. Left ventricular diastolic function parameters were normal. - Aortic valve: There was trivial regurgitation. - Mitral valve: Prolapse. Prolapse. There was mild to moderate regurgitation. Valve area by continuity equation (using LVOT flow): 2.8 cm^2. - Left atrium: The atrium was mildly dilated.  Transthoracic echocardiography. M-mode, complete 2D, spectral Doppler, and color Doppler. Birthdate: Patient birthdate: 1951-04-09. Age: Patient is 65 yr old. Sex: Gender: female. BMI: 23.7 kg/m^2. Blood pressure:   115/75 Patient status: Inpatient. Study date: Study date: 11/03/2015. Study time: 10:47 AM. Location: Bedside.  -------------------------------------------------------------------  ------------------------------------------------------------------- Left ventricle: The cavity size was normal. Wall thickness was normal. Systolic function was normal. The estimated ejection fraction was in the range of 55% to 60%. Wall motion was normal; there were no regional wall motion abnormalities. The transmitral flow pattern was normal. The deceleration time of the early transmitral flow velocity was normal. The pulmonary vein flow pattern was normal. The tissue Doppler parameters were normal. Left ventricular diastolic function parameters were normal.  ------------------------------------------------------------------- Aortic valve:  Structurally normal valve. Trileaflet. Cusp separation was normal. Doppler: Transvalvular velocity was within the normal range. There was no stenosis. There was trivial regurgitation.  ------------------------------------------------------------------- Aorta: The aorta was normal, not dilated, and  non-diseased.  ------------------------------------------------------------------- Mitral valve:  Prolapse. There is prolapse of the posterior leaflet of the mitral valve. There is mild-moderate mitral regurgitation directed toward the atrial septum. Prolapse. Doppler: There was mild to moderate regurgitation.  Valve area by continuity equation (using LVOT flow): 2.8 cm^2. Indexed valve area by continuity equation (using LVOT flow): 1.66 cm^2/m^2. Mean gradient (D): 4 mm Hg. Peak gradient (D): 7 mm Hg.  ------------------------------------------------------------------- Left atrium: The atrium was mildly dilated.  ------------------------------------------------------------------- Right ventricle: The cavity size was normal. Wall thickness was normal. Systolic function was normal.  ------------------------------------------------------------------- Pulmonic valve:  Structurally normal valve.  Cusp separation was normal. Doppler: Transvalvular velocity was within the normal range. There was no regurgitation.  ------------------------------------------------------------------- Tricuspid valve:  Normal thickness leaflets. Doppler: There was mild regurgitation.  ------------------------------------------------------------------- Right atrium: The atrium was at the upper limits of normal in size.  ------------------------------------------------------------------- Pericardium: There was no pericardial effusion.  ------------------------------------------------------------------- Systemic veins: Inferior vena cava: The vessel was normal in size. The respirophasic diameter changes were in the normal range (>= 50%), consistent with normal central venous pressure.  ------------------------------------------------------------------- Post procedure conclusions Ascending Aorta:  - The aorta was normal, not dilated, and  non-diseased.  ------------------------------------------------------------------- Measurements  Left ventricle              Value     Reference LV ID, ED, PLAX chordal      (H)   53.1 mm    43 - 52 LV ID, ES, PLAX chordal          32.9 mm    23 - 38 LV fx shortening, PLAX chordal      38  %    >=29 LV PW thickness, ED            9.15 mm    --------- IVS/LV PW ratio, ED            0.99      <=1.3 Stroke volume, 2D             128  ml    --------- Stroke volume/bsa, 2D           76  ml/m^2  --------- LV ejection fraction, 1-p A4C       59  %    ---------  LV end-diastolic volume, 2-p       81  ml    --------- LV end-systolic volume, 2-p        30  ml    --------- LV ejection fraction, 2-p         63  %    --------- Stroke volume, 2-p            51  ml    --------- LV end-diastolic volume/bsa, 2-p     48  ml/m^2  --------- LV end-systolic volume/bsa, 2-p      18  ml/m^2  --------- Stroke volume/bsa, 2-p          30.3 ml/m^2  --------- LV e&', lateral              9.36 cm/s   --------- LV E/e&', lateral             13.78     --------- LV e&', medial               8.59 cm/s   --------- LV E/e&', medial              15.02     --------- LV e&', average              8.98 cm/s   --------- LV E/e&', average             14.37     ---------  Ventricular septum            Value     Reference IVS thickness, ED             9.1  mm    ---------  LVOT                   Value     Reference LVOT ID, S                23  mm    --------- LVOT area                 4.15 cm^2    --------- LVOT peak velocity, S           144  cm/s   --------- LVOT mean velocity, S           73.4 cm/s   --------- LVOT VTI, S                30.8 cm    --------- LVOT peak gradient, S           8   mm Hg  ---------  Aorta                   Value     Reference Aortic root ID, ED            32  mm    ---------  Left atrium                Value     Reference LA ID, A-P, ES              43  mm    --------- LA ID/bsa, A-P          (H)   2.55 cm/m^2  <=2.2 LA volume, S               82.2 ml    --------- LA volume/bsa, S  48.7 ml/m^2  --------- LA volume, ES, 1-p A4C          69.7 ml    --------- LA volume/bsa, ES, 1-p A4C        41.3 ml/m^2  --------- LA volume, ES, 1-p A2C          86.4 ml    --------- LA volume/bsa, ES, 1-p A2C        51.2 ml/m^2  ---------  Mitral valve               Value     Reference Mitral E-wave peak velocity        129  cm/s   --------- Mitral A-wave peak velocity        81.9 cm/s   --------- Mitral mean velocity, D          94.6 cm/s   --------- Mitral deceleration time         215  ms    150 - 230 Mitral mean gradient, D          4   mm Hg  --------- Mitral peak gradient, D          7   mm Hg  --------- Mitral E/A ratio, peak          1.6      --------- Mitral valve area, LVOT          2.8  cm^2   --------- continuity Mitral valve area/bsa, LVOT        1.66 cm^2/m^2 --------- continuity Mitral annulus VTI, D           45.7 cm    ---------  Pulmonary arteries            Value     Reference PA pressure, S, DP             28  mm Hg  <=30  Tricuspid valve              Value     Reference Tricuspid regurg peak velocity      252  cm/s   --------- Tricuspid peak RV-RA gradient       25  mm Hg  ---------  Systemic veins              Value     Reference Estimated CVP               3   mm Hg  ---------  Right ventricle              Value     Reference RV pressure, S, DP            28  mm Hg  <=30 RV s&', lateral, S             17.8 cm/s   ---------  Legend: (L) and (H) mark values outside specified reference range.  ------------------------------------------------------------------- Prepared and Electronically Authenticated by  Cassell Clement, Murphy 2016-11-04T12:58:24   Transesophageal Echocardiography  Patient:  Becky Murphy, Becky Murphy MR #:    [id number] Study Date: 01/05/2016 Gender:   F Age:    48 Height:   167.6 cm Weight:   64.1 kg BSA:    1.73 m^2 Pt. Status: Room:  ADMITTING  Becky Fair, Murphy ATTENDING  Becky Fair, Murphy ORDERING   Becky Fair, Murphy PERFORMING  Becky Fair, Murphy REFERRING  Becky Fair, Murphy SONOGRAPHER Jimmy Reel, RDCS  cc:  ------------------------------------------------------------------- LV EF: 60% -  65%  -------------------------------------------------------------------  Indications:   Mitral valve prolapse 424.0. Mitral regurgitation 424.0.  ------------------------------------------------------------------- Study Conclusions  - Left ventricle: Systolic function was normal. The estimated ejection fraction was in the range of 60% to 65%. Wall motion was normal; there were no regional wall motion abnormalities. - Aortic valve: There was trivial regurgitation. - Mitral valve: Severe, holosystolicprolapse, involving the middle scallop of the posterior leaflet. There  was moderate to severe regurgitation directed eccentrically and anteriorly. - Left atrium: No evidence of thrombus in the atrial cavity or appendage. - Right atrium: No evidence of thrombus in the atrial cavity or appendage.  Impressions:  - The sevrity of mitral insufficiency was likely underestimated on transthoracic echo due to the highly eccentric mitral insufficiency jet.  Diagnostic transesophageal echocardiography. 2D and color Doppler. Birthdate: Patient birthdate: [birth year]. Age: Patient is 65 yr old. Sex: Gender: female.  BMI: 22.8 kg/m^2. Blood pressure: 130/80 Patient status: Outpatient. Study date: Study date: 01/05/2016. Study time: 01:16 PM.  -------------------------------------------------------------------  ------------------------------------------------------------------- Left ventricle: Systolic function was normal. The estimated ejection fraction was in the range of 60% to 65%. Wall motion was normal; there were no regional wall motion abnormalities.  ------------------------------------------------------------------- Aortic valve:  Structurally normal valve. Trileaflet; normal thickness leaflets. Cusp separation was normal. Doppler: There was trivial regurgitation.  ------------------------------------------------------------------- Aorta: There was no atheroma. There was no evidence for dissection. Aortic root: The aortic root was not dilated. Ascending aorta: The ascending aorta was normal in size. Aortic arch: The aortic arch was normal in size. Descending aorta: The descending aorta was normal in size.  ------------------------------------------------------------------- Mitral valve:  Mildly thickened leaflets . Mild myxomatous degeneration. Leaflet separation was normal. Severe, holosystolicprolapse, involving the middle scallop of the posterior leaflet. Doppler: There was moderate to severe  regurgitation directed eccentrically and anteriorly.  ------------------------------------------------------------------- Left atrium: The atrium was normal in size. No evidence of thrombus in the atrial cavity or appendage. The appendage was morphologically a left appendage, multilobulated, and of normal size. Emptying velocity was normal.  ------------------------------------------------------------------- Right ventricle: The cavity size was normal. Wall thickness was normal. Systolic function was normal.  ------------------------------------------------------------------- Pulmonic valve:  Structurally normal valve.  ------------------------------------------------------------------- Tricuspid valve:  Structurally normal valve.  Leaflet separation was normal. Doppler: There was mild regurgitation.  ------------------------------------------------------------------- Pulmonary artery:  The main pulmonary artery was normal-sized.  ------------------------------------------------------------------- Right atrium: The atrium was normal in size. No evidence of thrombus in the atrial cavity or appendage. The appendage was morphologically a right appendage.  ------------------------------------------------------------------- Pericardium: There was no pericardial effusion.  ------------------------------------------------------------------- Measurements  Mitral valve                Value    Reference Mitral maximal regurg velocity, PISA    495  cm/s --------- Mitral regurg VTI, PISA           157  cm  --------- Mitral ERO, PISA              0.68  cm^2 --------- Mitral regurg volume, PISA         107  ml  ---------  Pulmonary arteries             Value    Reference PA pressure, S, DP             26   mm Hg <=30  Tricuspid valve                Value    Reference Tricuspid regurg peak velocity       239.63  cm/s --------- Tricuspid peak RV-RA gradient        23   mm Hg --------- Tricuspid maximal regurg velocity,     239.63 cm/s --------- PISA  Systemic veins               Value    Reference Estimated CVP                3   mm Hg ---------  Right ventricle               Value    Reference RV pressure, S, DP             26   mm Hg <=30  Legend: (L) and (H) mark values outside specified reference range.  ------------------------------------------------------------------- Prepared and Electronically Authenticated by  Becky Fair, Murphy 2017-01-06T16:24:49   CARDIAC CATHETERIZATION Procedures    Right/Left Heart Cath and Coronary Angiography    Conclusion     Procedures performed:  1. Right and left heart catheterization  2. Selective coronary angiography  3. Left ventriculography   Reason for procedure:   Unexplained atypical chest pain Valvular heart disease  Procedure performed by: Becky Fair, Murphy, [facility]  Complications: none   Estimated blood loss: less than 5 mL   History: 65 year old with moderate to severe mitral insufficiency due to P2 scallop holosystolic prolapse. Atypical chest pain.  Consent: The risks, benefits, and details of the procedure were explained to the patient. Risks including death, MI, stroke, bleeding, limb ischemia, renal failure and allergy were described and accepted by the patient. Informed written consent was obtained prior to proceeding.  Technique: The patient was brought to the cardiac catheterization laboratory in the fasting state. He was prepped and draped in the usual sterile fashion. Local anesthesia with 1% lidocaine was administered to the right wrist area. AA right antecubital IV was excganged for a short venous sheath, 70F. A 70F PA catheter was  advanced to the main pulmonary artery, used to record pressures and PA oxygen saturation. Using the modified Seldinger technique a 5 French right radial artery sheath was introduced without difficulty. Under fluoroscopic guidance, using 5 French TIG and angled pigtail catheters, selective cannulation of the left coronary artery, right coronary artery and left ventricle were respectively performed. Several coronary angiograms in a variety of projections were recorded, as well as a left ventriculogram in the RAO projection. Left ventricular pressure and a pull back to the aorta were recorded. No immediate complications occurred. At the end of the procedure, all catheters were removed. After the procedure, hemostasis will be achieved with a TR band.  Contrast used: 50 mL Omnipaque Medications: Versed 2 mg + Fentanyl 50 mcg IV, Heparin 3500 units IV, Verapamil 5 mg IV  Angiographic Findings:  1. The left main coronary artery is free of significant atherosclerosis and bifurcates in the usual fashion into the left anterior descending artery and a dominant left circumflex coronary artery.  2. The left anterior descending artery is a large vessel that reaches the apex and generates 2 major diagonal branches. There is evidence of no luminal irregularities and no calcification. No hemodynamically meaningful stenoses are seen. 3. The left circumflex coronary artery is a large-size vessel dominant vessel that generates 4 major oblique marginal arteries and the posterior descending artery. There is evidence of no luminal irregularities and no calcification. No hemodynamically meaningful stenoses are seen. 4. The right coronary artery is a small-size non-dominant vessel that generates RV branches.  5. The  left ventricle is normal in size. The left ventricle systolic function is normalwith an estimated ejection fraction of 65-70%. Regional wall motion abnormalities are no seen. No left ventricular thrombus is seen.  There is at least moderate mitral insufficiency (PVCs during injection make it harder to evaluate). The ascending aorta appears normal. There is no aortic valve stenosis by pullback. The left ventricular end-diastolic pressure is 7 mm Hg.    Hemodynamic findings:  Aortic pressure 108/68 (mean 87) mm Hg   Left ventricle 104/0 with end-diastolic pressure of 7 mm Hg  PA wedge pressure a wave 7, v wave 7 (mean 5) mm Hg  Pulmonary artery 23/6 (mean 14) mm Hg  Right ventricle 25/2 with an end-diastolic pressure of 3 mm Hg  Right atrium a wave 5, v wave 2 (mean 3) mm Hg  Cardiac output is 5.0 L per minute (cardiac index 2.9 L per minute per meter sq)   IMPRESSIONS:  Moderate (possibly severe, eccentric) mitral insufficiency. Normal coronary arteries. Normal hemodynamics.  RECOMMENDATION:  Monitor closely, will eventually require referral for MV repair.    Implants    Name ID Temporary Type Supply   No information to display    PACS Images    Show images for Cardiac catheterization     Link to Procedure Log    Procedure Log      Hemo Data       Most Recent Value   Fick Cardiac Output  5.01 L/min   Fick Cardiac Output Index  2.91 (L/min)/BSA   RA A Wave  6 mmHg   RA V Wave  1 mmHg   RA Mean  3 mmHg   RV Systolic Pressure  25 mmHg   RV Diastolic Pressure  2 mmHg   RV EDP  5 mmHg   PA Systolic Pressure  23 mmHg   PA Diastolic Pressure  6 mmHg   PA Mean  14 mmHg   PW A Wave  7 mmHg   PW V Wave  7 mmHg   PW Mean  5 mmHg   AO Systolic Pressure  108 mmHg   AO Diastolic Pressure  68 mmHg   AO Mean  87 mmHg   LV Systolic Pressure  107 mmHg   LV Diastolic Pressure  2 mmHg   LV EDP  8 mmHg   Arterial Occlusion Pressure Extended Systolic Pressure  107 mmHg   Arterial Occlusion Pressure Extended Diastolic Pressure  63 mmHg    Arterial Occlusion Pressure Extended Mean Pressure  83 mmHg   Left Ventricular Apex Extended Systolic Pressure  104 mmHg   Left Ventricular Apex Extended Diastolic Pressure  0 mmHg   Left Ventricular Apex Extended EDP Pressure  7 mmHg   QP/QS  1   TPVR Index  4.81 HRUI   TSVR Index  29.9 HRUI   PVR SVR Ratio  0.11   TPVR/TSVR Ratio  0.16           CT ANGIOGRAPHY CHEST, ABDOMEN AND PELVIS  TECHNIQUE: Multidetector CT imaging through the chest, abdomen and pelvis was performed using the standard protocol during bolus administration of intravenous contrast. Multiplanar reconstructed images and MIPs were obtained and reviewed to evaluate the vascular anatomy.  CONTRAST: 75 mL Isovue 370 administered intravenously  COMPARISON: None.  FINDINGS: CTA CHEST FINDINGS  Mediastinum: Probable 2.4 cm enhancing mixed cystic and solid nodule arising from the inferior aspect of the left thyroid gland. The remainder of the thoracic inlet is unremarkable. No suspicious  mediastinal adenopathy or mass. Unremarkable thoracic esophagus.  Heart/Vascular: Elongated thoracic aortic infundibulum and descending thoracic aorta. Conventional 3 vessel arch anatomy. The ascending thoracic aorta is ectatic at 3.6 cm in diameter but not truly aneurysmal. No evidence of aortic dissection. The majority of the heart is within normal limits in size. There is dilatation of the left atrium and left atrial appendage. No evidence of left atrial appendage thrombus. No definite coronary artery calcifications within the limitations of this non cardiac gated examination. No pericardial fluid.  Lungs/Pleura: 2 mm subpleural nodule associated with the right major fissure (image 21 series 5). This has punctate attenuation centrally and is likely a small partially calcified granuloma. No further imaging follow-up is required. Otherwise, the lungs are clear.  No pleural effusion.  Bones/Soft Tissues: No acute fracture or aggressive appearing lytic or blastic osseous lesion.  Review of the MIP images confirms the above findings.  CTA ABDOMEN AND PELVIS FINDINGS  VASCULAR  Aorta: Normal caliber abdominal aorta with trace calcified atherosclerotic plaque. No irregular fibro fatty pack or wall adherent mural thrombus.  Celiac: Widely patent and unremarkable. Conventional hepatic arterial anatomy. No evidence of visceral artery aneurysm.  SMA: Widely patent and unremarkable.  Renals: 2 renal arteries are present bilaterally. No evidence of significant stenosis or fibromuscular dysplasia.  IMA: Patent and unremarkable.  Inflow: Mildly tortuous iliac systems. The vessels are normal in caliber measuring 7-8 mm in diameter bilaterally. No evidence of dissection, aneurysm or stenosis.  Proximal Outflow: Within normal limits.  Veins: No focal venous abnormality.  NON-VASCULAR  Abdomen: Normal CT appearance of the stomach, duodenum and adrenal glands. Numerous macro lobulated low-attenuation lesions are present throughout the spleen with the largest measuring 3.2 cm in the medial aspect. Additionally, there is a circumscribed 3.1 cm low-attenuation cystic lesion arising from the body of the pancreas. 2.8 cm arterially enhancing rounded mass arising from the pancreatic neck. There is washout on the delayed phase imaging.  2.8 cm simple cyst in the central aspect of the right liver and 1.5 cm simple cyst in hepatic segment 3. Gallstones layer within the gallbladder. No evidence of gallbladder wall thickening or pericholecystic fluid. No intra or extrahepatic biliary ductal dilatation.  The 5 mm nonobstructing stone in the interpolar right kidney. No enhancing renal mass. 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 No evidence of  hydronephrosis or enhancing renal mass. Tiny sub cm low-attenuation lesions in the right kidney are too small to characterize but statistically highly likely benign cysts.  No evidence of obstruction or focal bowel wall thickening. Normal appendix in the right lower quadrant. The terminal ileum is unremarkable. No free fluid or suspicious adenopathy.  Pelvis: Unremarkable appearance of the at adnexa, uterus and bladder. No free fluid or suspicious adenopathy.  Bones/Soft Tissues: No acute fracture or aggressive appearing lytic or blastic osseous lesion. Small fat containing periumbilical hernia. L4-L5 and L5-S1 degenerative disc disease.  Review of the MIP images confirms the above findings.  IMPRESSION: VASCULAR  1. Minimal atherosclerotic plaque and vascular tortuosity without evidence of aneurysm, dissection or hemodynamically significant stenosis. 2. Tortuous thoracic and abdominal aorta. Mild ectasia of the ascending thoracic aorta to a maximal diameter of 3.6 cm. 3. Dilated left atrium and left atrial appendage consistent with the clinical history of mitral valve regurgitation. NON VASCULAR  1. 2.8 cm arterially enhancing mass within the pancreatic neck. The mass demonstrates washout on delayed phase imaging. Differential  considerations include pancreatic neuroendocrine tumor, pancreatic adenocarcinoma, solitary papillary epithelial neoplasm of the pancreas, and other primary pancreatic neoplastic processes. Recommend further evaluation with dedicated pancreas protocol MRI and/or endoscopic ultrasound with biopsy. 2. A separate 3.1 cm circumscribed low-attenuation cystic lesion arises from the body of the pancreas. This may represent a pancreatic cyst, benign cystic neoplasm, branch duct IPMN or residual pseudocyst if the patient has a clinical history of prior pancreatitis. 3. Hepatic and renal simple cysts. 4. Low-attenuation macro lobular lesions throughout  the spleen may represent mildly complex cysts, hemangiomas, or other benign entities. Malignant metastatic disease to the spleen is extremely unlikely in the absence of widespread metastatic disease. The structures could be further evaluated at the time of pancreas protocol MRI as recommended above. 5. 2.4 cm enhancing predominantly solid nodule arising from the inferior aspect of the left thyroid gland. Recommend further evaluation with dedicated thyroid ultrasound. 6. Nonobstructing right nephrolithiasis. 7. Small fat containing periumbilical hernia. 8. Lower lumbar degenerative disc disease. These results will be called to the ordering clinician or representative by the Radiologist Assistant, and communication documented in the PACS or zVision Dashboard.  Signed,  Sterling Big, Murphy  [facility]  [facility]   Electronically Signed  By: Malachy Moan M.D.  On: 01/29/2016 11:50   MRI ABDOMEN WITHOUT AND WITH CONTRAST  TECHNIQUE: Multiplanar multisequence MR imaging of the abdomen was performed both before and after the administration of intravenous contrast.  CONTRAST: 13mL MULTIHANCE GADOBENATE DIMEGLUMINE 529 MG/ML IV SOLN  COMPARISON: 01/29/2016 CT angiogram of the chest, abdomen and pelvis.  FINDINGS: Lower chest: Clear lung bases.  Hepatobiliary: Normal liver size and configuration. No hepatic steatosis. There are six liver cysts scattered throughout the liver, some of which are simple and some of which are minimally complex with thin internal septations, several of which demonstrate lobulated contours, largest 2.7 cm in segment 8 of the right liver lobe. No suspicious liver masses. Gallbladder contains several layering gallstones measuring up to the 8 mm in size. No gallbladder wall thickening or pericholecystic fluid. No biliary ductal dilatation. Common bile duct diameter 5 mm. No  choledocholithiasis.  Pancreas: No pancreas divisum. No main pancreatic duct dilation. There is a 2.3 x 2.3 x 2.6 cm mass in the pancreatic body (series 24/image 52), which demonstrates heterogeneous internal hypoenhancement and T2 hyperintensity and which appears rounded and slightly lobulated in contour. There is a separate round unilocular 2.9 x 2.8 x 2.8 cm cystic pancreatic lesion at the junction of the pancreatic body and tail (series 17/image 20), which demonstrates no internal septations, solid components or enhancement and which demonstrates a mildly thickened and slightly irregular enhancing wall.  Spleen: Normal size spleen. There are at least 5 similar-appearing cystic lesions scattered throughout the spleen demonstrating mildly lobulated outer contour and several thin internal septations with minimal septal enhancement and no solid enhancing components, in keeping with splenic lymphangiomas, largest 3.3 x 2.6 x 3.2 cm.  Adrenals/Urinary Tract: Normal adrenals. No hydronephrosis. 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 There are tiny simple cysts in the right kidney, largest 0.8 cm in the upper right kidney.  Stomach/Bowel: Grossly normal stomach. Visualized small and large bowel is normal caliber, with no bowel wall thickening.  Vascular/Lymphatic: Normal caliber abdominal  aorta. Patent portal, splenic, hepatic and renal veins. No pathologically enlarged lymph nodes in the abdomen.  Other: No abdominal ascites or focal fluid collection.  Musculoskeletal: No aggressive appearing focal osseous lesions.  IMPRESSION: 1. Pancreatic body 2.6 cm hypoenhancing mass. Differential includes a pancreatic adenocarcinoma or pancreatic  neuroendocrine tumor. The hypoenhancement is less typical for a pancreatic neuroendocrine tumor, although this diagnosis is favored given the relatively circumscribed appearance and absence of main pancreatic duct dilation, which would be atypical for a primary pancreatic adenocarcinoma of this size. 2. Separate 2.9 cm unilocular cystic pancreatic lesion at the junction of the pancreatic body and tail with mildly thickened and slightly irregular enhancing wall and no internal complexity. Differential includes a mucinous cystic pancreatic neoplasm or pancreatic pseudocyst. Endoscopic ultrasound-guided tissue sampling is advised for both of these pancreatic masses. 3. No evidence of metastatic disease in the abdomen. 4. Subcentimeter left renal angiomyolipoma. Simple tiny right renal cysts. 5. Splenic lymphangiomas. 6. Benign-appearing liver cysts. 7. Cholelithiasis.   Electronically Signed  By: Delbert Phenix M.D.  On: 02/09/2016 12:03     Impression:  Patient has mitral valve prolapse with stage D severe symptomatic primary mitral regurgitation. She remains clinically stable with symptoms of frequent palpitations, atypical chest discomfort, and shortness of breath with moderate level exertion. She still has some abdominal discomfort and mild tenderness on physical exam that began following her endoscopic ultrasound with transgastric needle aspiration biopsy of her pancreatic cystic mass. This biopsy was performed nearly 2 weeks ago. She is not having fevers or chills. She does not have peritoneal signs on physical exam.  Plan:  I discussed options at length with the patient and her husband in the office today. Most incurred conservative approach would be to postpone elective mitral valve repair further until her abdominal discomfort has completely resolved. The patient is very anxious about her surgery and wants to proceed this week if possible. Under the circumstances I feel that  we should obtain repeat CT scan or MRI of the abdomen to make sure that she does not have signs of pancreatic pseudocyst, abscess, or significant hematoma.  The patient and her husband were again counseled at length regarding the indications, risks and potential benefits of mitral valve repair. The rationale for elective surgery has been explained, including a comparison between surgery and continued medical therapy with close follow-up. The likelihood of successful and durable valve repair has been discussed with particular reference to the findings of their recent echocardiogram. Based upon these findings and previous experience, I have quoted them a greater than 95 percent likelihood of successful valve repair. The patient understands and accepts all potential risks of surgery including but not limited to risk of death, stroke or other neurologic complication, myocardial infarction, congestive heart failure, respiratory failure, renal failure, bleeding requiring transfusion and/or reexploration, arrhythmia, infection or other wound complications, pneumonia, pleural and/or pericardial effusion, pulmonary embolus, aortic dissection or other major vascular complication, or delayed complications related to valve repair or replacement including but not limited to structural valve deterioration and failure, thrombosis, embolization, endocarditis, or paravalvular leak. Alternative surgical approaches have been discussed including a comparison between conventional sternotomy and minimally-invasive techniques. The relative risks and benefits of each have been reviewed as they pertain to the patient's specific circumstances, and all of their questions have been addressed. Specific risks potentially related to the minimally-invasive approach were discussed at length, including but not limited to risk of conversion to full or partial sternotomy, aortic dissection or other major  vascular complication, unilateral acute  lung injury or pulmonary edema, phrenic nerve dysfunction or paralysis, rib fracture, chronic pain, lung hernia, or lymphocele. All of their questions have been answered.      Salvatore Decentlarence H. Cornelius Moraswen, Murphy 03/04/2016 10:22 AM    CT ABDOMEN WITHOUT AND WITH CONTRAST  TECHNIQUE: Multidetector CT imaging of the abdomen was performed following the standard protocol before and following the bolus administration of intravenous contrast.  CONTRAST: 100mL ISOVUE-300 IOPAMIDOL (ISOVUE-300) INJECTION 61%  COMPARISON: 02/09/2016 MRI abdomen. 01/29/2016 CT angiogram of the abdomen and pelvis.  FINDINGS: Lower chest: No significant pulmonary nodules or acute consolidative airspace disease.  Hepatobiliary: There is a lobulated simple 2.4 cm central segment 8 right liver lobe cyst. There is a lobulated simple 1.5 cm lateral segment left liver lobe cyst. Additional subcentimeter hypodense liver lesions are too small to characterize and were characterized as simple liver cysts on the recent MRI. No new liver masses. Layering gallstones in the gallbladder up to 7 mm in size. No gallbladder wall thickening or pericholecystic fluid. No biliary ductal dilatation.  Pancreas: There is a hypoenhancing 2.4 x 2.1 cm pancreatic mass in the pancreatic body (series 4/ image 41), previously 2.3 x 2.3 cm on 02/09/2016 MRI, not appreciably changed. There is a 2.9 x 2.8 cm cystic pancreatic mass at the junction of the pancreatic body and tail (series 4/image 44) with a mildly thickened enhancing wall, previously 2.9 x 2.8 cm on 02/09/2016 MRI, not appreciably changed. No main pancreatic duct dilation. No new pancreatic mass. No peripancreatic fluid collections or fat stranding.  Spleen: Normal size spleen. There at least 5 scattered low-density splenic lesions measuring up to 3.1 cm in size, a few of which demonstrate thin internal septations, unchanged from 02/09/2016 MRI, where they were  characterized as benign splenic lymphangiomas.  Adrenals/Urinary Tract: Normal adrenals. Nonobstructing 5 mm stone in the mid to upper right kidney. No left renal stones. No hydronephrosis. There is a 0.9 cm angiomyolipoma in the medial upper left kidney. Additional subcentimeter hypodense renal cortical lesions in both kidneys are too small to characterize, and were characterized as tiny simple renal cysts on the recent MRI. 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  Stomach/Bowel: Grossly normal stomach. Visualized small and large bowel is normal caliber, with no bowel wall thickening.  Vascular/Lymphatic: Normal caliber abdominal aorta. Patent portal, splenic, hepatic and renal veins. No pathologically enlarged lymph nodes in the abdomen.  Other: No pneumoperitoneum, ascites or focal fluid collection.  Musculoskeletal: No aggressive appearing focal osseous lesions. Moderate degenerative changes in the visualized thoracolumbar spine.  IMPRESSION: 1. No acute abnormality. No CT evidence of acute pancreatitis. No acute intra-abdominal hematoma. No new pancreatic mass or peripancreatic fluid collection. 2. Two indeterminate pancreatic masses, not appreciably changed since 02/09/2016 MRI. No main pancreatic duct dilation. A pancreatic malignancy cannot be excluded, particularly in the hypoenhancing 2.4 cm pancreatic body mass. Close imaging follow-up is recommended, preferably with MRI abdomen without and with intravenous contrast in 3-6 months. 3. 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   Electronically Signed  By: Delbert PhenixJason A Poff M.D.  On: 03/04/2016 16:23   Call to discuss results of CT scan of the abdomen performed earlier today. Patient was reassured that the CT scan appeared unchanged in comparison with previous scans. There is  specifically was no sign of pancreatitis, abscess formation, pseudocyst, or hematoma following her recent biopsy. We plan to proceed  with mitral valve repair later this week as originally scheduled. All questions answered.  Purcell Nails, Murphy 03/04/2016 6:07 PM

## 2016-03-06 ENCOUNTER — Inpatient Hospital Stay (HOSPITAL_COMMUNITY): Payer: BC Managed Care – PPO | Admitting: Certified Registered"

## 2016-03-06 ENCOUNTER — Inpatient Hospital Stay (HOSPITAL_COMMUNITY): Payer: BC Managed Care – PPO

## 2016-03-06 ENCOUNTER — Encounter (HOSPITAL_COMMUNITY): Payer: Self-pay | Admitting: Surgery

## 2016-03-06 ENCOUNTER — Encounter (HOSPITAL_COMMUNITY)
Admission: RE | Disposition: A | Discharge status: Home / Self Care | Payer: Self-pay | Source: Ambulatory Visit | Attending: Thoracic Surgery (Cardiothoracic Vascular Surgery)

## 2016-03-06 ENCOUNTER — Inpatient Hospital Stay (HOSPITAL_COMMUNITY)
Admission: RE | Admit: 2016-03-06 | Discharge: 2016-03-12 | DRG: 220 | Disposition: A | Discharge status: Home / Self Care | Payer: BC Managed Care – PPO | Source: Ambulatory Visit | Attending: Thoracic Surgery (Cardiothoracic Vascular Surgery) | Admitting: Thoracic Surgery (Cardiothoracic Vascular Surgery)

## 2016-03-06 DIAGNOSIS — R0609 Other forms of dyspnea: Secondary | ICD-10-CM | POA: Diagnosis present

## 2016-03-06 DIAGNOSIS — I341 Nonrheumatic mitral (valve) prolapse: Secondary | ICD-10-CM | POA: Diagnosis present

## 2016-03-06 DIAGNOSIS — E8770 Fluid overload, unspecified: Secondary | ICD-10-CM | POA: Diagnosis not present

## 2016-03-06 DIAGNOSIS — D62 Acute posthemorrhagic anemia: Secondary | ICD-10-CM | POA: Diagnosis not present

## 2016-03-06 DIAGNOSIS — Z09 Encounter for follow-up examination after completed treatment for conditions other than malignant neoplasm: Secondary | ICD-10-CM

## 2016-03-06 DIAGNOSIS — F419 Anxiety disorder, unspecified: Secondary | ICD-10-CM | POA: Diagnosis present

## 2016-03-06 DIAGNOSIS — D6959 Other secondary thrombocytopenia: Secondary | ICD-10-CM | POA: Diagnosis not present

## 2016-03-06 DIAGNOSIS — D72829 Elevated white blood cell count, unspecified: Secondary | ICD-10-CM | POA: Diagnosis not present

## 2016-03-06 DIAGNOSIS — I340 Nonrheumatic mitral (valve) insufficiency: Secondary | ICD-10-CM | POA: Diagnosis present

## 2016-03-06 DIAGNOSIS — Z79899 Other long term (current) drug therapy: Secondary | ICD-10-CM

## 2016-03-06 DIAGNOSIS — Z4682 Encounter for fitting and adjustment of non-vascular catheter: Secondary | ICD-10-CM

## 2016-03-06 DIAGNOSIS — J9811 Atelectasis: Secondary | ICD-10-CM

## 2016-03-06 DIAGNOSIS — Z9889 Other specified postprocedural states: Secondary | ICD-10-CM

## 2016-03-06 DIAGNOSIS — R0600 Dyspnea, unspecified: Secondary | ICD-10-CM | POA: Diagnosis present

## 2016-03-06 HISTORY — PX: CLIPPING OF ATRIAL APPENDAGE: SHX5773

## 2016-03-06 HISTORY — PX: MITRAL VALVE REPAIR: SHX2039

## 2016-03-06 HISTORY — DX: Other specified postprocedural states: Z98.890

## 2016-03-06 HISTORY — PX: TEE WITHOUT CARDIOVERSION: SHX5443

## 2016-03-06 LAB — POCT I-STAT 3, ART BLOOD GAS (G3+)
ACID-BASE DEFICIT: 4 mmol/L — AB (ref 0.0–2.0)
ACID-BASE DEFICIT: 5 mmol/L — AB (ref 0.0–2.0)
Acid-base deficit: 3 mmol/L — ABNORMAL HIGH (ref 0.0–2.0)
Acid-base deficit: 7 mmol/L — ABNORMAL HIGH (ref 0.0–2.0)
Bicarbonate: 22.9 mEq/L (ref 20.0–24.0)
Bicarbonate: 20.9 mEq/L (ref 20.0–24.0)
Bicarbonate: 19.3 mEq/L — ABNORMAL LOW (ref 20.0–24.0)
Bicarbonate: 21.8 mEq/L (ref 20.0–24.0)
O2 Saturation: 100 %
O2 Saturation: 94 %
O2 Saturation: 95 %
O2 Saturation: 96 %
PCO2 ART: 45.2 mmHg — AB (ref 35.0–45.0)
PH ART: 7.344 — AB (ref 7.350–7.450)
PH ART: 7.261 — AB (ref 7.350–7.450)
PO2 ART: 88 mmHg (ref 80.0–100.0)
Patient temperature: 36.8
TCO2: 24 mmol/L (ref 0–100)
TCO2: 22 mmol/L (ref 0–100)
TCO2: 21 mmol/L (ref 0–100)
TCO2: 23 mmol/L (ref 0–100)
pCO2 arterial: 42.2 mmHg (ref 35.0–45.0)
pCO2 arterial: 36 mmHg (ref 35.0–45.0)
pCO2 arterial: 42.9 mmHg (ref 35.0–45.0)
pH, Arterial: 7.368 (ref 7.350–7.450)
pH, Arterial: 7.291 — ABNORMAL LOW (ref 7.350–7.450)
pO2, Arterial: 398 mmHg — ABNORMAL HIGH (ref 80.0–100.0)
pO2, Arterial: 70 mmHg — ABNORMAL LOW (ref 80.0–100.0)
pO2, Arterial: 85 mmHg (ref 80.0–100.0)

## 2016-03-06 LAB — POCT I-STAT, CHEM 8
BUN: 19 mg/dL (ref 6–20)
BUN: 18 mg/dL (ref 6–20)
BUN: 16 mg/dL (ref 6–20)
BUN: 16 mg/dL (ref 6–20)
BUN: 15 mg/dL (ref 6–20)
BUN: 11 mg/dL (ref 6–20)
CALCIUM ION: 1.25 mmol/L (ref 1.13–1.30)
CALCIUM ION: 1.07 mmol/L — AB (ref 1.13–1.30)
CHLORIDE: 101 mmol/L (ref 101–111)
CHLORIDE: 111 mmol/L (ref 101–111)
Calcium, Ion: 1.25 mmol/L (ref 1.13–1.30)
Calcium, Ion: 1.02 mmol/L — ABNORMAL LOW (ref 1.13–1.30)
Calcium, Ion: 1.06 mmol/L — ABNORMAL LOW (ref 1.13–1.30)
Calcium, Ion: 1.24 mmol/L (ref 1.13–1.30)
Chloride: 106 mmol/L (ref 101–111)
Chloride: 106 mmol/L (ref 101–111)
Chloride: 100 mmol/L — ABNORMAL LOW (ref 101–111)
Chloride: 104 mmol/L (ref 101–111)
Creatinine, Ser: 0.4 mg/dL — ABNORMAL LOW (ref 0.44–1.00)
Creatinine, Ser: 0.4 mg/dL — ABNORMAL LOW (ref 0.44–1.00)
Creatinine, Ser: 0.4 mg/dL — ABNORMAL LOW (ref 0.44–1.00)
Creatinine, Ser: 0.4 mg/dL — ABNORMAL LOW (ref 0.44–1.00)
Creatinine, Ser: 0.4 mg/dL — ABNORMAL LOW (ref 0.44–1.00)
Creatinine, Ser: 0.6 mg/dL (ref 0.44–1.00)
GLUCOSE: 109 mg/dL — AB (ref 65–99)
GLUCOSE: 99 mg/dL (ref 65–99)
Glucose, Bld: 106 mg/dL — ABNORMAL HIGH (ref 65–99)
Glucose, Bld: 135 mg/dL — ABNORMAL HIGH (ref 65–99)
Glucose, Bld: 141 mg/dL — ABNORMAL HIGH (ref 65–99)
Glucose, Bld: 168 mg/dL — ABNORMAL HIGH (ref 65–99)
HCT: 25 % — ABNORMAL LOW (ref 36.0–46.0)
HEMATOCRIT: 41 % (ref 36.0–46.0)
HEMATOCRIT: 39 % (ref 36.0–46.0)
HEMATOCRIT: 30 % — AB (ref 36.0–46.0)
HEMATOCRIT: 27 % — AB (ref 36.0–46.0)
HEMATOCRIT: 35 % — AB (ref 36.0–46.0)
HEMOGLOBIN: 13.9 g/dL (ref 12.0–15.0)
HEMOGLOBIN: 13.3 g/dL (ref 12.0–15.0)
HEMOGLOBIN: 10.2 g/dL — AB (ref 12.0–15.0)
HEMOGLOBIN: 8.5 g/dL — AB (ref 12.0–15.0)
Hemoglobin: 9.2 g/dL — ABNORMAL LOW (ref 12.0–15.0)
Hemoglobin: 11.9 g/dL — ABNORMAL LOW (ref 12.0–15.0)
POTASSIUM: 3.7 mmol/L (ref 3.5–5.1)
POTASSIUM: 3.5 mmol/L (ref 3.5–5.1)
POTASSIUM: 3.4 mmol/L — AB (ref 3.5–5.1)
POTASSIUM: 4.2 mmol/L (ref 3.5–5.1)
Potassium: 3.6 mmol/L (ref 3.5–5.1)
Potassium: 3.8 mmol/L (ref 3.5–5.1)
SODIUM: 142 mmol/L (ref 135–145)
SODIUM: 135 mmol/L (ref 135–145)
SODIUM: 139 mmol/L (ref 135–145)
SODIUM: 142 mmol/L (ref 135–145)
Sodium: 141 mmol/L (ref 135–145)
Sodium: 137 mmol/L (ref 135–145)
TCO2: 27 mmol/L (ref 0–100)
TCO2: 25 mmol/L (ref 0–100)
TCO2: 24 mmol/L (ref 0–100)
TCO2: 23 mmol/L (ref 0–100)
TCO2: 24 mmol/L (ref 0–100)
TCO2: 20 mmol/L (ref 0–100)

## 2016-03-06 LAB — CREATININE, SERUM
CREATININE: 0.65 mg/dL (ref 0.44–1.00)
GFR calc non Af Amer: >60 mL/min (ref >60)

## 2016-03-06 LAB — CBC
HCT: 35.6 % — ABNORMAL LOW (ref 36.0–46.0)
HEMATOCRIT: 36.4 % (ref 36.0–46.0)
HEMOGLOBIN: 12.7 g/dL (ref 12.0–15.0)
Hemoglobin: 12.3 g/dL (ref 12.0–15.0)
MCH: 31.8 pg (ref 26.0–34.0)
MCH: 31.9 pg (ref 26.0–34.0)
MCHC: 34.9 g/dL (ref 30.0–36.0)
MCHC: 34.6 g/dL (ref 30.0–36.0)
MCV: 91.2 fL (ref 78.0–100.0)
MCV: 92.5 fL (ref 78.0–100.0)
PLATELETS: 137 10*3/uL — AB (ref 150–400)
Platelets: 145 10*3/uL — ABNORMAL LOW (ref 150–400)
RBC: 3.99 MIL/uL (ref 3.87–5.11)
RBC: 3.85 MIL/uL — AB (ref 3.87–5.11)
RDW: 13.4 % (ref 11.5–15.5)
RDW: 13.8 % (ref 11.5–15.5)
WBC: 15.1 10*3/uL — AB (ref 4.0–10.5)
WBC: 16 10*3/uL — AB (ref 4.0–10.5)

## 2016-03-06 LAB — POCT I-STAT 4, (NA,K, GLUC, HGB,HCT)
Glucose, Bld: 119 mg/dL — ABNORMAL HIGH (ref 65–99)
HCT: 35 % — ABNORMAL LOW (ref 36.0–46.0)
Hemoglobin: 11.9 g/dL — ABNORMAL LOW (ref 12.0–15.0)
POTASSIUM: 3.5 mmol/L (ref 3.5–5.1)
SODIUM: 142 mmol/L (ref 135–145)

## 2016-03-06 LAB — APTT: APTT: 37 s (ref 24–37)

## 2016-03-06 LAB — PLATELET COUNT: PLATELETS: 122 10*3/uL — AB (ref 150–400)

## 2016-03-06 LAB — HEMOGLOBIN AND HEMATOCRIT, BLOOD
HEMATOCRIT: 26.5 % — AB (ref 36.0–46.0)
Hemoglobin: 9.4 g/dL — ABNORMAL LOW (ref 12.0–15.0)

## 2016-03-06 LAB — PROTIME-INR
INR: 1.31 (ref 0.00–1.49)
Prothrombin Time: 16.4 seconds — ABNORMAL HIGH (ref 11.6–15.2)

## 2016-03-06 SURGERY — REPAIR, MITRAL VALVE, MINIMALLY INVASIVE
Anesthesia: General | Site: Chest | Laterality: Right

## 2016-03-06 MED ORDER — METOPROLOL TARTRATE 1 MG/ML IV SOLN: 2.5000 mg | INTRAVENOUS | Status: DC | PRN

## 2016-03-06 MED ORDER — SODIUM CHLORIDE 0.45 % IV SOLN
INTRAVENOUS | Status: DC | PRN
  Administered 2016-03-06: 15:00:00 via INTRAVENOUS

## 2016-03-06 MED ORDER — 0.9 % SODIUM CHLORIDE (POUR BTL) OPTIME
TOPICAL | Status: DC | PRN
  Administered 2016-03-06: 5000 mL

## 2016-03-06 MED ORDER — SODIUM CHLORIDE 0.9 % IV SOLN
200.0000 ug | INTRAVENOUS | Status: DC | PRN
  Administered 2016-03-06: .5 ug/kg/h via INTRAVENOUS

## 2016-03-06 MED ORDER — ROCURONIUM BROMIDE 50 MG/5ML IV SOLN
INTRAVENOUS | Status: AC
  Filled 2016-03-06: qty 1

## 2016-03-06 MED ORDER — PANTOPRAZOLE SODIUM 40 MG PO TBEC
40.0000 mg | DELAYED_RELEASE_TABLET | Freq: Every day | ORAL | Status: DC
  Administered 2016-03-08 – 2016-03-12 (×5): 40 mg via ORAL
  Filled 2016-03-06 (×5): qty 1

## 2016-03-06 MED ORDER — INSULIN REGULAR BOLUS VIA INFUSION
0.0000 [IU] | Freq: Three times a day (TID) | INTRAVENOUS | Status: DC
  Filled 2016-03-06: qty 10

## 2016-03-06 MED ORDER — INSULIN REGULAR HUMAN 100 UNIT/ML IJ SOLN
INTRAMUSCULAR | Status: DC
  Administered 2016-03-06: 20:00:00 via INTRAVENOUS
  Filled 2016-03-06: qty 2.5

## 2016-03-06 MED ORDER — EPHEDRINE SULFATE 50 MG/ML IJ SOLN
INTRAMUSCULAR | Status: DC | PRN
  Administered 2016-03-06: 5 mg via INTRAVENOUS

## 2016-03-06 MED ORDER — LACTATED RINGERS IV SOLN
INTRAVENOUS | Status: DC | PRN
  Administered 2016-03-06: 08:00:00 via INTRAVENOUS

## 2016-03-06 MED ORDER — SODIUM CHLORIDE 0.9 % IV SOLN
INTRAVENOUS | Status: DC
  Administered 2016-03-06: 16:00:00 via INTRAVENOUS

## 2016-03-06 MED ORDER — FENTANYL CITRATE (PF) 250 MCG/5ML IJ SOLN
INTRAMUSCULAR | Status: DC | PRN
  Administered 2016-03-06: 250 ug via INTRAVENOUS
  Administered 2016-03-06: 100 ug via INTRAVENOUS
  Administered 2016-03-06: 200 ug via INTRAVENOUS
  Administered 2016-03-06 (×2): 50 ug via INTRAVENOUS
  Administered 2016-03-06: 150 ug via INTRAVENOUS
  Administered 2016-03-06: 300 ug via INTRAVENOUS
  Administered 2016-03-06: 250 ug via INTRAVENOUS
  Administered 2016-03-06: 50 ug via INTRAVENOUS
  Administered 2016-03-06: 100 ug via INTRAVENOUS
  Administered 2016-03-06: 250 ug via INTRAVENOUS

## 2016-03-06 MED ORDER — OXYCODONE HCL 5 MG PO TABS
5.0000 mg | ORAL_TABLET | ORAL | Status: DC | PRN
  Administered 2016-03-07: 10 mg via ORAL
  Filled 2016-03-06: qty 1
  Filled 2016-03-06: qty 2

## 2016-03-06 MED ORDER — PHENYLEPHRINE 40 MCG/ML (10ML) SYRINGE FOR IV PUSH (FOR BLOOD PRESSURE SUPPORT)
PREFILLED_SYRINGE | INTRAVENOUS | Status: AC
Start: 2016-03-06 — End: 2016-03-06
  Filled 2016-03-06: qty 10

## 2016-03-06 MED ORDER — PROTAMINE SULFATE 10 MG/ML IV SOLN
INTRAVENOUS | Status: DC | PRN
  Administered 2016-03-06: 150 mg via INTRAVENOUS

## 2016-03-06 MED ORDER — PHENYLEPHRINE HCL 10 MG/ML IJ SOLN
0.0000 ug/min | INTRAVENOUS | Status: DC
  Administered 2016-03-06: 15 ug/min via INTRAVENOUS
  Administered 2016-03-07: 5 ug/min via INTRAVENOUS
  Filled 2016-03-06 (×3): qty 2

## 2016-03-06 MED ORDER — SODIUM CHLORIDE 0.9 % IV SOLN
250.0000 [IU] | INTRAVENOUS | Status: DC | PRN
  Administered 2016-03-06: .9 [IU]/h via INTRAVENOUS

## 2016-03-06 MED ORDER — BUPIVACAINE HCL (PF) 0.5 % IJ SOLN
INTRAMUSCULAR | Status: AC
  Filled 2016-03-06: qty 10

## 2016-03-06 MED ORDER — ANTISEPTIC ORAL RINSE SOLUTION (CORINZ)
7.0000 mL | Freq: Four times a day (QID) | OROMUCOSAL | Status: DC
  Administered 2016-03-06 – 2016-03-08 (×3): 7 mL via OROMUCOSAL

## 2016-03-06 MED ORDER — LIDOCAINE HCL (CARDIAC) 20 MG/ML IV SOLN
INTRAVENOUS | Status: AC
  Filled 2016-03-06: qty 5

## 2016-03-06 MED ORDER — BUPIVACAINE HCL 0.5 % IJ SOLN
INTRAMUSCULAR | Status: DC | PRN
  Administered 2016-03-06: 10 mL

## 2016-03-06 MED ORDER — DEXMEDETOMIDINE HCL IN NACL 200 MCG/50ML IV SOLN
0.0000 ug/kg/h | INTRAVENOUS | Status: DC
  Administered 2016-03-06: 0.1 ug/kg/h via INTRAVENOUS
  Filled 2016-03-06: qty 50

## 2016-03-06 MED ORDER — MIDAZOLAM HCL 2 MG/2ML IJ SOLN: 2.0000 mg | INTRAMUSCULAR | Status: DC | PRN

## 2016-03-06 MED ORDER — METOPROLOL TARTRATE 25 MG/10 ML ORAL SUSPENSION: 12.5000 mg | Freq: Two times a day (BID) | ORAL | Status: DC

## 2016-03-06 MED ORDER — ALBUMIN HUMAN 5 % IV SOLN
250.0000 mL | INTRAVENOUS | Status: DC | PRN
  Administered 2016-03-06 (×3): 250 mL via INTRAVENOUS
  Filled 2016-03-06 (×2): qty 250

## 2016-03-06 MED ORDER — ALBUMIN HUMAN 5 % IV SOLN
INTRAVENOUS | Status: DC | PRN
  Administered 2016-03-06: 13:00:00 via INTRAVENOUS

## 2016-03-06 MED ORDER — MORPHINE SULFATE (PF) 2 MG/ML IV SOLN: 1.0000 mg | INTRAVENOUS | Status: DC | PRN

## 2016-03-06 MED ORDER — ROCURONIUM BROMIDE 100 MG/10ML IV SOLN
INTRAVENOUS | Status: DC | PRN
  Administered 2016-03-06 (×5): 50 mg via INTRAVENOUS

## 2016-03-06 MED ORDER — CHLORHEXIDINE GLUCONATE 0.12% ORAL RINSE (MEDLINE KIT)
15.0000 mL | Freq: Two times a day (BID) | OROMUCOSAL | Status: DC
  Administered 2016-03-06 – 2016-03-07 (×2): 15 mL via OROMUCOSAL

## 2016-03-06 MED ORDER — SODIUM CHLORIDE 0.9% FLUSH
10.0000 mL | INTRAVENOUS | Status: DC | PRN
  Administered 2016-03-08: 20 mL
  Filled 2016-03-06: qty 40

## 2016-03-06 MED ORDER — NITROGLYCERIN IN D5W 200-5 MCG/ML-% IV SOLN: 0.0000 ug/min | INTRAVENOUS | Status: DC

## 2016-03-06 MED ORDER — CHLORHEXIDINE GLUCONATE 0.12 % MT SOLN
15.0000 mL | OROMUCOSAL | Status: AC
  Administered 2016-03-06: 15 mL via OROMUCOSAL
  Filled 2016-03-06: qty 15

## 2016-03-06 MED ORDER — ONDANSETRON HCL 4 MG/2ML IJ SOLN
4.0000 mg | Freq: Four times a day (QID) | INTRAMUSCULAR | Status: DC | PRN
  Administered 2016-03-07 – 2016-03-08 (×3): 4 mg via INTRAVENOUS
  Filled 2016-03-06 (×3): qty 2

## 2016-03-06 MED ORDER — ALBUMIN HUMAN 5 % IV SOLN
250.0000 mL | INTRAVENOUS | Status: AC | PRN
Start: 2016-03-06 — End: 2016-03-07
  Administered 2016-03-06 – 2016-03-07 (×2): 250 mL via INTRAVENOUS
  Filled 2016-03-06: qty 250

## 2016-03-06 MED ORDER — AMIODARONE HCL IN DEXTROSE 360-4.14 MG/200ML-% IV SOLN
60.0000 mg/h | INTRAVENOUS | Status: AC
  Administered 2016-03-06: 60 mg/h via INTRAVENOUS
  Filled 2016-03-06: qty 200

## 2016-03-06 MED ORDER — ASPIRIN 81 MG PO CHEW: 324.0000 mg | CHEWABLE_TABLET | Freq: Every day | ORAL | Status: DC

## 2016-03-06 MED ORDER — BUPIVACAINE 0.5 % ON-Q PUMP SINGLE CATH 400 ML
400.0000 mL | INJECTION | Status: DC
  Administered 2016-03-06: 400 mL
  Filled 2016-03-06: qty 400

## 2016-03-06 MED ORDER — BISACODYL 5 MG PO TBEC
10.0000 mg | DELAYED_RELEASE_TABLET | Freq: Every day | ORAL | Status: DC
  Administered 2016-03-08: 10 mg via ORAL
  Filled 2016-03-06 (×4): qty 2

## 2016-03-06 MED ORDER — PROPOFOL 10 MG/ML IV BOLUS
INTRAVENOUS | Status: AC
  Filled 2016-03-06: qty 20

## 2016-03-06 MED ORDER — LIDOCAINE HCL (CARDIAC) 20 MG/ML IV SOLN
INTRAVENOUS | Status: DC | PRN
  Administered 2016-03-06: 60 mg via INTRATRACHEAL

## 2016-03-06 MED ORDER — VANCOMYCIN HCL 1000 MG IV SOLR
INTRAVENOUS | Status: AC
  Administered 2016-03-06: 1000 mL
  Filled 2016-03-06: qty 1000

## 2016-03-06 MED ORDER — LACTATED RINGERS IV SOLN: 500.0000 mL | Freq: Once | INTRAVENOUS | Status: DC | PRN

## 2016-03-06 MED ORDER — DOCUSATE SODIUM 100 MG PO CAPS
200.0000 mg | ORAL_CAPSULE | Freq: Every day | ORAL | Status: DC
  Administered 2016-03-08: 200 mg via ORAL
  Filled 2016-03-06 (×4): qty 2

## 2016-03-06 MED ORDER — SODIUM CHLORIDE 0.9 % IJ SOLN
INTRAMUSCULAR | Status: AC
  Filled 2016-03-06: qty 10

## 2016-03-06 MED ORDER — METOPROLOL TARTRATE 12.5 MG HALF TABLET
12.5000 mg | ORAL_TABLET | Freq: Two times a day (BID) | ORAL | Status: DC
Start: 2016-03-06 — End: 2016-03-07

## 2016-03-06 MED ORDER — SODIUM CHLORIDE 0.9 % IV SOLN
INTRAVENOUS | Status: DC
  Administered 2016-03-06: 15:00:00 via INTRAVENOUS

## 2016-03-06 MED ORDER — ACETAMINOPHEN 160 MG/5ML PO SOLN: 1000.0000 mg | Freq: Four times a day (QID) | ORAL | Status: DC

## 2016-03-06 MED ORDER — SODIUM CHLORIDE 0.9% FLUSH: 3.0000 mL | INTRAVENOUS | Status: DC | PRN

## 2016-03-06 MED ORDER — AMIODARONE HCL IN DEXTROSE 360-4.14 MG/200ML-% IV SOLN
30.0000 mg/h | INTRAVENOUS | Status: DC
  Administered 2016-03-06 – 2016-03-07 (×2): 30 mg/h via INTRAVENOUS
  Filled 2016-03-06 (×2): qty 200

## 2016-03-06 MED ORDER — SODIUM CHLORIDE 0.9 % IV SOLN
250.0000 mL | INTRAVENOUS | Status: DC
  Administered 2016-03-06: 14:00:00 via INTRAVENOUS

## 2016-03-06 MED ORDER — ASPIRIN EC 325 MG PO TBEC
325.0000 mg | DELAYED_RELEASE_TABLET | Freq: Every day | ORAL | Status: DC
Start: 2016-03-07 — End: 2016-03-08

## 2016-03-06 MED ORDER — ACETAMINOPHEN 500 MG PO TABS
1000.0000 mg | ORAL_TABLET | Freq: Four times a day (QID) | ORAL | Status: AC
  Administered 2016-03-07 – 2016-03-11 (×14): 1000 mg via ORAL
  Filled 2016-03-06 (×14): qty 2

## 2016-03-06 MED ORDER — DEXTROSE 5 % IV SOLN
1.5000 g | Freq: Two times a day (BID) | INTRAVENOUS | Status: AC
  Administered 2016-03-06 – 2016-03-08 (×4): 1.5 g via INTRAVENOUS
  Filled 2016-03-06 (×4): qty 1.5

## 2016-03-06 MED ORDER — SODIUM CHLORIDE 0.9% FLUSH
3.0000 mL | Freq: Two times a day (BID) | INTRAVENOUS | Status: DC
  Administered 2016-03-11: 3 mL via INTRAVENOUS

## 2016-03-06 MED ORDER — HEPARIN SODIUM (PORCINE) 1000 UNIT/ML IJ SOLN
INTRAMUSCULAR | Status: DC | PRN
  Administered 2016-03-06: 16 mL via INTRAVENOUS

## 2016-03-06 MED ORDER — FENTANYL CITRATE (PF) 250 MCG/5ML IJ SOLN
INTRAMUSCULAR | Status: AC
  Filled 2016-03-06: qty 30

## 2016-03-06 MED ORDER — FAMOTIDINE IN NACL 20-0.9 MG/50ML-% IV SOLN
20.0000 mg | Freq: Two times a day (BID) | INTRAVENOUS | Status: DC
  Administered 2016-03-06: 20 mg via INTRAVENOUS

## 2016-03-06 MED ORDER — POTASSIUM CHLORIDE 10 MEQ/50ML IV SOLN
10.0000 meq | Freq: Once | INTRAVENOUS | Status: AC
  Administered 2016-03-06: 10 meq via INTRAVENOUS

## 2016-03-06 MED ORDER — VANCOMYCIN HCL IN DEXTROSE 1-5 GM/200ML-% IV SOLN
1000.0000 mg | Freq: Once | INTRAVENOUS | Status: AC
  Administered 2016-03-06: 1000 mg via INTRAVENOUS
  Filled 2016-03-06: qty 200

## 2016-03-06 MED ORDER — MIDAZOLAM HCL 5 MG/ML IJ SOLN
INTRAMUSCULAR | Status: DC | PRN
  Administered 2016-03-06: 2 mg via INTRAVENOUS
  Administered 2016-03-06: 5 mg via INTRAVENOUS
  Administered 2016-03-06: 3 mg via INTRAVENOUS
  Administered 2016-03-06 (×2): 5 mg via INTRAVENOUS

## 2016-03-06 MED ORDER — SUCCINYLCHOLINE CHLORIDE 20 MG/ML IJ SOLN
INTRAMUSCULAR | Status: AC
  Filled 2016-03-06: qty 1

## 2016-03-06 MED ORDER — SODIUM CHLORIDE 0.9% FLUSH
10.0000 mL | Freq: Two times a day (BID) | INTRAVENOUS | Status: DC
  Administered 2016-03-06: 10 mL

## 2016-03-06 MED ORDER — LACTATED RINGERS IV SOLN: INTRAVENOUS | Status: DC

## 2016-03-06 MED ORDER — PROTAMINE SULFATE 10 MG/ML IV SOLN
INTRAVENOUS | Status: AC
  Filled 2016-03-06: qty 25

## 2016-03-06 MED ORDER — HEPARIN SODIUM (PORCINE) 1000 UNIT/ML IJ SOLN
INTRAMUSCULAR | Status: AC
  Filled 2016-03-06: qty 1

## 2016-03-06 MED ORDER — MIDAZOLAM HCL 10 MG/2ML IJ SOLN
INTRAMUSCULAR | Status: AC
  Filled 2016-03-06: qty 4

## 2016-03-06 MED ORDER — TRAMADOL HCL 50 MG PO TABS
50.0000 mg | ORAL_TABLET | ORAL | Status: DC | PRN
  Administered 2016-03-08 – 2016-03-12 (×5): 100 mg via ORAL
  Filled 2016-03-06 (×5): qty 2

## 2016-03-06 MED ORDER — ACETAMINOPHEN 650 MG RE SUPP
650.0000 mg | Freq: Once | RECTAL | Status: AC
  Administered 2016-03-06: 650 mg via RECTAL

## 2016-03-06 MED ORDER — BISACODYL 10 MG RE SUPP: 10.0000 mg | Freq: Every day | RECTAL | Status: DC

## 2016-03-06 MED ORDER — ACETAMINOPHEN 160 MG/5ML PO SOLN: 650.0000 mg | Freq: Once | ORAL | Status: AC

## 2016-03-06 MED ORDER — SODIUM CHLORIDE 0.9 % IV SOLN
10.0000 g | INTRAVENOUS | Status: DC | PRN
  Administered 2016-03-06: 5 g/h via INTRAVENOUS

## 2016-03-06 MED ORDER — POTASSIUM CHLORIDE 10 MEQ/50ML IV SOLN
10.0000 meq | INTRAVENOUS | Status: AC
  Administered 2016-03-06 (×3): 10 meq via INTRAVENOUS
  Filled 2016-03-06: qty 50

## 2016-03-06 MED ORDER — PROPOFOL 10 MG/ML IV BOLUS
INTRAVENOUS | Status: DC | PRN
  Administered 2016-03-06 (×2): 40 mg via INTRAVENOUS
  Administered 2016-03-06: 50 mg via INTRAVENOUS

## 2016-03-06 MED ORDER — MORPHINE SULFATE (PF) 2 MG/ML IV SOLN
2.0000 mg | INTRAVENOUS | Status: DC | PRN
  Administered 2016-03-06 – 2016-03-07 (×2): 2 mg via INTRAVENOUS
  Filled 2016-03-06 (×2): qty 1

## 2016-03-06 MED ORDER — EPHEDRINE SULFATE 50 MG/ML IJ SOLN
INTRAMUSCULAR | Status: AC
  Filled 2016-03-06: qty 1

## 2016-03-06 MED ORDER — PHENYLEPHRINE HCL 10 MG/ML IJ SOLN
10.0000 mg | INTRAVENOUS | Status: DC | PRN
  Administered 2016-03-06: 40 ug/min via INTRAVENOUS

## 2016-03-06 MED ORDER — MAGNESIUM SULFATE 4 GM/100ML IV SOLN
4.0000 g | Freq: Once | INTRAVENOUS | Status: AC
  Administered 2016-03-06: 4 g via INTRAVENOUS
  Filled 2016-03-06: qty 100

## 2016-03-06 MED ORDER — FENTANYL CITRATE (PF) 250 MCG/5ML IJ SOLN
INTRAMUSCULAR | Status: AC
  Filled 2016-03-06: qty 5

## 2016-03-06 MED ORDER — SODIUM CHLORIDE 0.9 % IR SOLN
Status: DC | PRN
  Administered 2016-03-06: 1000 mL
  Administered 2016-03-06: 3000 mL

## 2016-03-06 MED FILL — Heparin Sodium (Porcine) Inj 1000 Unit/ML: INTRAMUSCULAR | Qty: 30 | Status: AC

## 2016-03-06 MED FILL — Potassium Chloride Inj 2 mEq/ML: INTRAVENOUS | Qty: 40 | Status: AC

## 2016-03-06 MED FILL — Magnesium Sulfate Inj 50%: INTRAMUSCULAR | Qty: 10 | Status: AC

## 2016-03-06 SURGICAL SUPPLY — 126 items
ADAPTER CARDIO PERF ANTE/RETRO (ADAPTER) ×4 IMPLANT
ARTICLIP LAA PROCLIP II 45 (Clip) ×4 IMPLANT
BAG DECANTER FOR FLEXI CONT (MISCELLANEOUS) ×8 IMPLANT
BLADE STERNUM SYSTEM 6 (BLADE) ×4 IMPLANT
BLADE SURG 11 STRL SS (BLADE) ×4 IMPLANT
CANISTER SUCTION 2500CC (MISCELLANEOUS) ×8 IMPLANT
CANNULA FEM VENOUS REMOTE 22FR (CANNULA) ×4 IMPLANT
CANNULA FEMORAL ART 14 SM (MISCELLANEOUS) ×4 IMPLANT
CANNULA GUNDRY RCSP 15FR (MISCELLANEOUS) ×4 IMPLANT
CANNULA OPTISITE PERFUSION 16F (CANNULA) IMPLANT
CANNULA OPTISITE PERFUSION 18F (CANNULA) ×8 IMPLANT
CANNULA SUMP PERICARDIAL (CANNULA) ×8 IMPLANT
CATH KIT ON Q 5IN SLV (PAIN MANAGEMENT) IMPLANT
CATH KIT ON-Q SILVERSOAK 5IN (CATHETERS) ×4 IMPLANT
CELLS DAT CNTRL 66122 CELL SVR (MISCELLANEOUS) ×3 IMPLANT
CONN ST 1/4X3/8  BEN (MISCELLANEOUS) ×2
CONN ST 1/4X3/8 BEN (MISCELLANEOUS) ×6 IMPLANT
CONNECTOR 1/2X3/8X1/2 3 WAY (MISCELLANEOUS) ×1
CONNECTOR 1/2X3/8X1/2 3WAY (MISCELLANEOUS) ×3 IMPLANT
CONT SPEC STER OR (MISCELLANEOUS) ×4 IMPLANT
COVER BACK TABLE 24X17X13 BIG (DRAPES) ×4 IMPLANT
COVER PROBE W GEL 5X96 (DRAPES) ×4 IMPLANT
CRADLE DONUT ADULT HEAD (MISCELLANEOUS) ×4 IMPLANT
DECANTER SPIKE VIAL GLASS SM (MISCELLANEOUS) ×4 IMPLANT
DERMABOND ADVANCED (GAUZE/BANDAGES/DRESSINGS) ×2
DERMABOND ADVANCED .7 DNX12 (GAUZE/BANDAGES/DRESSINGS) ×6 IMPLANT
DEVICE ATRICLIP LAA PRCLPII 45 (Clip) ×3 IMPLANT
DEVICE PMI PUNCTURE CLOSURE (MISCELLANEOUS) ×4 IMPLANT
DEVICE SUT CK QUICK LOAD MINI (Prosthesis & Implant Heart) ×8 IMPLANT
DEVICE TROCAR PUNCTURE CLOSURE (ENDOMECHANICALS) ×4 IMPLANT
DRAIN CHANNEL 28F RND 3/8 FF (WOUND CARE) ×8 IMPLANT
DRAPE BILATERAL SPLIT (DRAPES) ×4 IMPLANT
DRAPE C-ARM 42X72 X-RAY (DRAPES) ×4 IMPLANT
DRAPE CV SPLIT W-CLR ANES SCRN (DRAPES) ×4 IMPLANT
DRAPE INCISE IOBAN 66X45 STRL (DRAPES) ×12 IMPLANT
DRAPE SLUSH/WARMER DISC (DRAPES) ×4 IMPLANT
DRSG COVADERM 4X10 (GAUZE/BANDAGES/DRESSINGS) ×4 IMPLANT
DRSG COVADERM 4X8 (GAUZE/BANDAGES/DRESSINGS) ×4 IMPLANT
ELECT BLADE 6.5 EXT (BLADE) ×4 IMPLANT
ELECT REM PT RETURN 9FT ADLT (ELECTROSURGICAL) ×8
ELECTRODE REM PT RTRN 9FT ADLT (ELECTROSURGICAL) ×6 IMPLANT
FELT TEFLON 1X6 (MISCELLANEOUS) ×8 IMPLANT
FEMORAL VENOUS CANN RAP (CANNULA) IMPLANT
FLUID NSS /IRRIG 3000 ML XXX (IV SOLUTION) ×4 IMPLANT
GLOVE BIO SURGEON STRL SZ 6.5 (GLOVE) ×16 IMPLANT
GLOVE BIO SURGEON STRL SZ7 (GLOVE) ×16 IMPLANT
GLOVE BIOGEL PI IND STRL 6.5 (GLOVE) ×12 IMPLANT
GLOVE BIOGEL PI IND STRL 7.0 (GLOVE) ×12 IMPLANT
GLOVE BIOGEL PI INDICATOR 6.5 (GLOVE) ×4
GLOVE BIOGEL PI INDICATOR 7.0 (GLOVE) ×4
GLOVE ORTHO TXT STRL SZ7.5 (GLOVE) ×12 IMPLANT
GOWN STRL REUS W/ TWL LRG LVL3 (GOWN DISPOSABLE) ×24 IMPLANT
GOWN STRL REUS W/TWL LRG LVL3 (GOWN DISPOSABLE) ×8
GUIDEWIRE TSCF (WIRE) ×4 IMPLANT
IV NS 1000ML (IV SOLUTION) ×1
IV NS 1000ML BAXH (IV SOLUTION) ×3 IMPLANT
KIT BASIN OR (CUSTOM PROCEDURE TRAY) ×4 IMPLANT
KIT DILATOR VASC 18G NDL (KITS) ×4 IMPLANT
KIT DRAINAGE VACCUM ASSIST (KITS) ×4 IMPLANT
KIT ROOM TURNOVER OR (KITS) ×4 IMPLANT
KIT SUCTION CATH 14FR (SUCTIONS) ×4 IMPLANT
KIT SUT CK MINI COMBO 4X17 (Prosthesis & Implant Heart) ×4 IMPLANT
LEAD PACING MYOCARDI (MISCELLANEOUS) ×4 IMPLANT
LINE VENT (MISCELLANEOUS) ×4 IMPLANT
NEEDLE AORTIC ROOT 14G 7F (CATHETERS) ×4 IMPLANT
NS IRRIG 1000ML POUR BTL (IV SOLUTION) ×20 IMPLANT
PACK OPEN HEART (CUSTOM PROCEDURE TRAY) ×4 IMPLANT
PAD ARMBOARD 7.5X6 YLW CONV (MISCELLANEOUS) ×8 IMPLANT
PAD ELECT DEFIB RADIOL ZOLL (MISCELLANEOUS) ×4 IMPLANT
PATCH CORMATRIX 4CMX7CM (Prosthesis & Implant Heart) ×4 IMPLANT
RETRACTOR TRL SOFT TISSUE LG (INSTRUMENTS) IMPLANT
RETRACTOR TRM SOFT TISSUE 7.5 (INSTRUMENTS) IMPLANT
RING MITRAL MEMO 3D 34MM SMD34 (Prosthesis & Implant Heart) ×4 IMPLANT
RTRCTR WOUND ALEXIS 18CM MED (MISCELLANEOUS) ×4
SET CANNULATION TOURNIQUET (MISCELLANEOUS) ×4 IMPLANT
SET CARDIOPLEGIA MPS 5001102 (MISCELLANEOUS) ×4 IMPLANT
SET IRRIG TUBING LAPAROSCOPIC (IRRIGATION / IRRIGATOR) ×4 IMPLANT
SOLUTION ANTI FOG 6CC (MISCELLANEOUS) ×4 IMPLANT
SPONGE GAUZE 4X4 12PLY STER LF (GAUZE/BANDAGES/DRESSINGS) ×4 IMPLANT
SUT BONE WAX W31G (SUTURE) ×4 IMPLANT
SUT E-PACK MINIMALLY INVASIVE (SUTURE) IMPLANT
SUT ETHIBOND (SUTURE) ×8 IMPLANT
SUT ETHIBOND 2 0 SH (SUTURE) ×4 IMPLANT
SUT ETHIBOND 2-0 RB-1 WHT (SUTURE) ×8 IMPLANT
SUT ETHIBOND NAB MH 2-0 36IN (SUTURE) ×4 IMPLANT
SUT ETHIBOND X763 2 0 SH 1 (SUTURE) ×4 IMPLANT
SUT GORETEX CV 4 TH 22 36 (SUTURE) ×8 IMPLANT
SUT GORETEX CV-5THC-13 36IN (SUTURE) ×32 IMPLANT
SUT GORETEX CV4 TH-18 (SUTURE) ×16 IMPLANT
SUT MNCRL AB 3-0 PS2 18 (SUTURE) ×8 IMPLANT
SUT PROLENE 3 0 SH DA (SUTURE) ×16 IMPLANT
SUT PROLENE 3 0 SH1 36 (SUTURE) ×32 IMPLANT
SUT PROLENE 4 0 RB 1 (SUTURE) ×1
SUT PROLENE 4-0 RB1 .5 CRCL 36 (SUTURE) ×3 IMPLANT
SUT PROLENE 5 0 C 1 36 (SUTURE) ×8 IMPLANT
SUT PROLENE 6 0 C 1 30 (SUTURE) ×12 IMPLANT
SUT PTFE CHORD X 24MM (SUTURE) ×4 IMPLANT
SUT SILK  1 MH (SUTURE) ×8
SUT SILK 1 MH (SUTURE) ×24 IMPLANT
SUT SILK 1 TIES 10X30 (SUTURE) ×4 IMPLANT
SUT SILK 2 0 SH CR/8 (SUTURE) ×4 IMPLANT
SUT SILK 2 0 TIES 10X30 (SUTURE) ×4 IMPLANT
SUT SILK 2 0SH CR/8 30 (SUTURE) ×8 IMPLANT
SUT SILK 3 0 (SUTURE) ×1
SUT SILK 3 0 SH CR/8 (SUTURE) ×4 IMPLANT
SUT SILK 3 0SH CR/8 30 (SUTURE) ×4 IMPLANT
SUT SILK 3-0 18XBRD TIE 12 (SUTURE) ×3 IMPLANT
SUT TEM PAC WIRE 2 0 SH (SUTURE) ×8 IMPLANT
SUT VIC AB 2-0 CTX 36 (SUTURE) ×8 IMPLANT
SUT VIC AB 2-0 UR6 27 (SUTURE) ×8 IMPLANT
SUT VIC AB 3-0 SH 8-18 (SUTURE) ×16 IMPLANT
SUT VICRYL 2 TP 1 (SUTURE) ×4 IMPLANT
SYRINGE 10CC LL (SYRINGE) ×4 IMPLANT
SYSTEM SAHARA CHEST DRAIN ATS (WOUND CARE) ×4 IMPLANT
TAPE CLOTH SURG 4X10 WHT LF (GAUZE/BANDAGES/DRESSINGS) ×4 IMPLANT
TAPE PAPER 2X10 WHT MICROPORE (GAUZE/BANDAGES/DRESSINGS) ×4 IMPLANT
TOWEL OR 17X24 6PK STRL BLUE (TOWEL DISPOSABLE) ×8 IMPLANT
TOWEL OR 17X26 10 PK STRL BLUE (TOWEL DISPOSABLE) ×8 IMPLANT
TRAY FOLEY IC TEMP SENS 16FR (CATHETERS) ×4 IMPLANT
TROCAR XCEL BLADELESS 5X75MML (TROCAR) ×4 IMPLANT
TROCAR XCEL NON-BLD 11X100MML (ENDOMECHANICALS) ×8 IMPLANT
TUBE SUCT INTRACARD DLP 20F (MISCELLANEOUS) ×4 IMPLANT
TUNNELER SHEATH ON-Q 11GX8 DSP (PAIN MANAGEMENT) ×4 IMPLANT
UNDERPAD 30X30 INCONTINENT (UNDERPADS AND DIAPERS) ×4 IMPLANT
WATER STERILE IRR 1000ML POUR (IV SOLUTION) ×8 IMPLANT
WIRE J 3MM .035X145CM (WIRE) ×4 IMPLANT

## 2016-03-06 NOTE — Progress Notes (Signed)
Patient has a slightly dusky area on lateral right heel that has a cap refill of 3-4 seconds. Heels floated on admission from [facility]

## 2016-03-06 NOTE — Brief Op Note (Addendum)
03/06/2016  12:50 PM  PATIENT:  Becky Murphy  65 y.o. female  PRE-OPERATIVE DIAGNOSIS:  1. SEVERE MR 2.MVP  POST-OPERATIVE DIAGNOSIS:  1. 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  MV Stenosis: No mitral valve stenosis.  MV Disease Functional Class: MV Disease Functional Class: Type II.  Etiology (Choose at least one and up to five): Degenerative.  MV Lesions (Choose at least one): Leaflet prolapse, posterior.  Mitral/Tricuspid/Pulmonary Valve Procedure  Mitral Valve Procedure Performed:  Repair: Annuloplasty., Leaflet Resection. Resection Type:Triangular. Mitral Leaflet Resection Location: Posterior. and Neochrods. Number of Neochords Inserted: 3 pairs  Implant: Annuloplasty Device: Implant model number [id number], Size 34, Unique Device Identifier [id number].  Tricuspid Valve Procedure Performed:  N/A  Implant: N/A  Pulmonary Valve Procedure Performed: N/A  Implant: N/A      COMPLICATIONS: None  BASELINE WEIGHT: 65 kg  PATIENT DISPOSITION:   TO SICU IN STABLE CONDITION  Purcell Nailslarence H Markeem Noreen, MD 03/06/2016 2:10 PM

## 2016-03-06 NOTE — Progress Notes (Signed)
Pt extubated per verbal telephone order from Dr. Donata ClayVan Trigt at [id number] with RN and RT in the room.

## 2016-03-06 NOTE — Progress Notes (Signed)
  Echocardiogram Echocardiogram Transesophageal has been performed.  Becky Murphy, Becky Murphy 03/06/2016, 10:19 AM

## 2016-03-06 NOTE — Progress Notes (Signed)
CT surgery p.m. Rounds  Patient stable after right minithoracotomy mitral valve ring repair Being paced for slow sinus rhythm Hemodynamics stable In the process of ventilator wean Doing very well

## 2016-03-06 NOTE — Interval H&P Note (Signed)
History and Physical Interval Note:  03/06/2016 7:35 AM  Becky Murphy  has presented today for surgery, with the diagnosis of SEVERE MR  The various methods of treatment have been discussed with the patient and family. After consideration of risks, benefits and other options for treatment, the patient has consented to  Procedure(s): MINIMALLY INVASIVE MITRAL VALVE REPAIR (MVR) (Right) TRANSESOPHAGEAL ECHOCARDIOGRAM (TEE) (N/A) as a surgical intervention .  The patient's history has been reviewed, patient examined, no change in status, stable for surgery.  I have reviewed the patient's chart and labs.  Questions were answered to the patient's satisfaction.     Purcell Nailslarence H Tanita Palinkas

## 2016-03-06 NOTE — Anesthesia Postprocedure Evaluation (Signed)
Anesthesia Post Note  Patient: Kathrynn RunningSandra H Treadway  Procedure(s) Performed: Procedure(s) (LRB): MINIMALLY INVASIVE MITRAL VALVE REPAIR (MVR) using a 34 Sorin Memo 3D Ring (Right) TRANSESOPHAGEAL ECHOCARDIOGRAM (TEE) (N/A) CLIPPING OF LEFT ATRIAL APPENDAGE using a 45 PRO2 AtriClip (Left)  Patient location during evaluation: ICU Anesthesia Type: General Level of consciousness: sedated Pain management: pain level controlled Vital Signs Assessment: post-procedure vital signs reviewed and stable Respiratory status: patient remains intubated per anesthesia plan Cardiovascular status: stable Postop Assessment: no signs of nausea or vomiting Anesthetic complications: no    Last Vitals:  Filed Vitals:   03/06/16 1630 03/06/16 1645  BP:    Pulse: 80 80  Temp: 35.9 C 35.8 C  Resp: 16 0    Last Pain: There were no vitals filed for this visit.               Michaelah Credeur

## 2016-03-06 NOTE — Anesthesia Procedure Notes (Addendum)
Procedure Name: Intubation Date/Time: 03/06/2016 9:08 AM Performed by: Rosiland OzMEYERS, Katlin Bortner Pre-anesthesia Checklist: Patient identified, Timeout performed, Emergency Drugs available, Suction available and Patient being monitored Patient Re-evaluated:Patient Re-evaluated prior to inductionOxygen Delivery Method: Circle system utilized Preoxygenation: Pre-oxygenation with 100% oxygen Intubation Type: IV induction Ventilation: Mask ventilation without difficulty Laryngoscope Size: Mac and 3 Grade View: Grade II Tube type: Oral Endobronchial tube: Left and Double lumen EBT and 37 Fr Number of attempts: 3 Airway Equipment and Method: Stylet Placement Confirmation: ETT inserted through vocal cords under direct vision,  positive ETCO2 and breath sounds checked- equal and bilateral Secured at: 27 cm Tube secured with: Tape Dental Injury: Teeth and Oropharynx as per pre-operative assessment  Comments: VC visualized, difficulty passing DLT esophobated x2   Procedure Name: Intubation Date/Time: 03/06/2016 2:10 PM Performed by: Rosiland OzMEYERS, Kahleel Fadeley Tube type: Parker flex tip Number of attempts: 1 Airway Equipment and Method: Bougie stylet (cook exchange catheter) Placement Confirmation: positive ETCO2 and breath sounds checked- equal and bilateral Secured at: 22 cm Tube secured with: Tape Dental Injury: Teeth and Oropharynx as per pre-operative assessment

## 2016-03-06 NOTE — Procedures (Signed)
NIF (-24) anc VC (1.4L) performed per cardiac surgery rapid wean protocol.

## 2016-03-06 NOTE — Anesthesia Preprocedure Evaluation (Addendum)
Anesthesia Evaluation  Patient identified by MRN, date of birth, ID band Patient awake    Reviewed: Allergy & Precautions, NPO status , Patient's Chart, lab work & pertinent test results  History of Anesthesia Complications (+) PONV and history of anesthetic complications  Airway Mallampati: II  TM Distance: >3 FB Neck ROM: Full    Dental  (+) Teeth Intact   Pulmonary shortness of breath,    breath sounds clear to auscultation       Cardiovascular + Valvular Problems/Murmurs MVP and MR  Rhythm:Regular     Neuro/Psych  Headaches, PSYCHIATRIC DISORDERS Anxiety Depression    GI/Hepatic PUD, Pancreatic mass   Endo/Other    Renal/GU      Musculoskeletal  (+) Arthritis ,   Abdominal   Peds  Hematology   Anesthesia Other Findings   Reproductive/Obstetrics                            Anesthesia Physical Anesthesia Plan  ASA: IV  Anesthesia Plan: General   Post-op Pain Management:    Induction: Intravenous  Airway Management Planned: Oral ETT and Double Lumen EBT  Additional Equipment: Arterial line, TEE, CVP, PA Cath and Ultrasound Guidance Line Placement  Intra-op Plan:   Post-operative Plan: Post-operative intubation/ventilation  Informed Consent: I have reviewed the patients History and Physical, chart, labs and discussed the procedure including the risks, benefits and alternatives for the proposed anesthesia with the patient or authorized representative who has indicated his/her understanding and acceptance.   Dental advisory given  Plan Discussed with: CRNA and Surgeon  Anesthesia Plan Comments:         Anesthesia Quick Evaluation

## 2016-03-06 NOTE — Transfer of Care (Signed)
Immediate Anesthesia Transfer of Care Note  Patient: Becky Murphy  Procedure(s) Performed: Procedure(s): MINIMALLY INVASIVE MITRAL VALVE REPAIR (MVR) using a 34 Sorin Memo 3D Ring (Right) TRANSESOPHAGEAL ECHOCARDIOGRAM (TEE) (N/A) CLIPPING OF LEFT ATRIAL APPENDAGE using a 45 PRO2 AtriClip (Left)  Patient Location: PACU and SICU  Anesthesia Type:General  Level of Consciousness: unresponsive  Airway & Oxygen Therapy: Patient remains intubated per anesthesia plan and Patient placed on Ventilator (see vital sign flow sheet for setting)  Post-op Assessment: Report given to RN and Post -op Vital signs reviewed and stable  Post vital signs: Reviewed and stable  Last Vitals:  Filed Vitals:   03/06/16 0655  BP: 116/79  Pulse: 66  Temp: 36.6 C  Resp: 20    Complications: No apparent anesthesia complications

## 2016-03-06 NOTE — Procedures (Signed)
Extubation Procedure Note  Patient Details:   Name: Kathrynn RunningSandra H Carrell DOB: 15-Jan-1951 MRN: [id number]   Airway Documentation:     Evaluation  O2 sats: stable throughout Complications: No apparent complications Patient did tolerate procedure well. Bilateral Breath Sounds: Clear, Diminished with + cuff leak.  Pt placed on 3L [place].     Yes   Gala RomneyMackey, Kalev Temme M 03/06/2016, 10:16 PM

## 2016-03-06 NOTE — Op Note (Signed)
CARDIOTHORACIC SURGERY OPERATIVE NOTE  Date of Procedure:  03/06/2016  Preoperative Diagnosis: Severe Mitral Regurgitation  Postoperative Diagnosis: Same  Procedure:    Minimally-Invasive Mitral Valve Repair  Complex valvuloplasty including triangular resection of posterior leaflet  Artificial Gore-tex neochord placement x6  Sorin Memo 3D Ring Annuloplasty (size 34mm, catalog # [id number], serial # [id number])  Clipping of LA Appendage    Surgeon: Salvatore Decent. 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 Patient states that she has been relatively healthy and physically active for the majority of her adult life. Last spring she first began to experience symptoms of exertional shortness of breath, fatigue, and atypical chest discomfort. She began to experience intermittent dizzy spells. She presented to the hospital acutely in early November with atypical chest discomfort, shortness of breath, and dizziness. EKG revealed normal sinus rhythm without ischemic changes and the patient ruled out for acute myocardial infarction based on serial cardiac enzymes. She was noted to have a systolic murmur on physical exam and transthoracic echocardiogram revealed mitral valve prolapse with mild to  moderate mitral regurgitation. The patient was seen in follow-up by Dr. Royann Shivers who noted that the patient had a fairly prominent murmur on physical exam and symptoms suggestive of symptomatic mitral valve disease. Transesophageal echocardiogram was recommended. TEE performed 01/05/2016 confirmed the presence of holosystolic prolapse of a large segment of the posterior leaflet of the mitral valve with severe mitral regurgitation, with an eccentric jet coursing anteriorly around the left atrium. Left ventricular size and systolic function remains normal. The patient subsequently underwent left and right heart catheterization on 01/16/2016 revealed normal coronary artery anatomy with no significant coronary artery disease. Pulmonary artery pressures were normal. The patient was referred for surgical consultation. The patient has been seen in consultation and counseled at length regarding the indications, risks and potential benefits of surgery.  All questions have been answered, and the patient provides full informed consent for the operation as described.    DETAILS OF THE OPERATIVE PROCEDURE  Preparation:  The patient is brought to the operating room on the above mentioned date and central monitoring was established by the anesthesia team including placement of Swan-Ganz catheter through the left internal jugular vein.  A radial arterial line is placed. The patient is placed in the supine position on the operating table.  Intravenous antibiotics are administered. General endotracheal anesthesia is induced uneventfully. The patient is initially intubated using a dual lumen endotracheal tube.  A Foley catheter is placed.  Baseline transesophageal echocardiogram was performed.  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 There was severe prolapse involving a large redundant middle scallop (P2) of the posterior leaflet. There were multiple  elongated chordae tendoneae. There were no obvious ruptured chordae tendineae. There was an eccentric jet of  mitral regurgitation coursing anteriorly around the left atrium. There was severe mitral regurgitation. There was mild to moderate left ventricular chamber enlargement. There was moderate left ventricular hypertrophy. There was normal left ventricular systolic function. The aortic valve was tricuspid. There was trace central aortic insufficiency. The tricuspid valve is normal. There was trivial tricuspid regurgitation. Right ventricular size and function were normal.  A soft roll is placed behind the patient's left scapula and the neck gently extended and turned to the left.   The patient's right neck, chest, abdomen, both groins, and both lower extremities are prepared and draped in a sterile manner. A time out procedure is performed.  Surgical Approach:  A right miniature anterolateral thoracotomy incision is performed. The incision is placed just lateral to and superior to the right nipple. The pectoralis major muscle is retracted medially and completely preserved. The right pleural space is entered through the 3rd intercostal space. A soft tissue retractor is placed.  Two 11 mm ports are placed through separate stab incisions inferiorly. The right pleural space is insufflated continuously with carbon dioxide gas through the posterior port during the remainder of the operation.  A pledgeted sutures placed through the dome of the right hemidiaphragm and retracted inferiorly to facilitate exposure.  A longitudinal incision is made in the pericardium 3 cm anterior to the phrenic nerve and silk traction sutures are placed on either side of the incision for exposure.   Extracorporeal Cardiopulmonary Bypass and Myocardial Protection:  A small incision is made in the right inguinal crease and the anterior surface of the right common femoral artery and right common femoral vein are identified.  The  patient is placed in Trendelenburg position. The right internal jugular vein is cannulated with Seldinger technique and a guidewire advanced into the right atrium. The patient is heparinized systemically. The right internal jugular vein is cannulated with a 14 [place] pediatric femoral venous cannula. Pursestring sutures are placed on the anterior surface of the right common femoral vein and right common femoral artery. The right common femoral vein is cannulated with the Seldinger technique and a guidewire is advanced under transesophageal echocardiogram guidance through the right atrium. The femoral vein is cannulated with a long 22 French femoral venous cannula. The right common femoral artery is cannulated with Seldinger technique and a flexible guidewire is advanced until it can be appreciated intraluminally in the descending thoracic aorta on transesophageal echocardiogram. The femoral artery is cannulated with an 18 French femoral arterial cannula.  Adequate heparinization is verified.     The entire pre-bypass portion of the operation was notable for stable hemodynamics.  However, the patient developed atrial fibrillation with a controlled ventricular rate early during the procedure.  Cardiopulmonary bypass was begun.  Vacuum assist venous drainage is utilized. The incision in the pericardium is extended in both directions. Venous drainage and exposure are notably excellent. A left atrial clip (Atricure Pro2 Atriclip, size 45 mm) is placed on the left atrial appendage. The clip was applied through the transverse sinus posterior to the aorta. Complete occlusion of the left atrial appendage is verified using transesophageal echocardiogram.  A retrograde cardioplegia cannula is placed through the right atrium into the coronary sinus using transesophageal echocardiogram guidance.  An antegrade cardioplegia cannula is placed in the ascending aorta.    The patient is cooled to 28C systemic temperature.  The  aortic cross clamp is applied and cold blood cardioplegia is delivered initially in an antegrade fashion through the aortic root.   Supplemental  cardioplegia is given retrograde through the coronary sinus catheter. The initial cardioplegic arrest is rapid with early diastolic arrest.  Repeat doses of cardioplegia are administered intermittently every 20 to 30 minutes throughout the entire cross clamp portion of the operation through the aortic root and through the coronary sinus catheter in order to maintain completely flat electrocardiogram.  Myocardial protection was felt to be excellent.   Mitral Valve Repair:  A left atriotomy incision was performed through the interatrial groove and extended partially across the back wall of the left atrium after opening the oblique sinus inferiorly.  The mitral valve is exposed using a self-retaining retractor.  The mitral valve was inspected and notable for forme fruste variant of Barlow's disease.  There is severe prolapse involving a large redundant middle scallop (P2) of the posterior leaflet and there multiple elongated primary chordae tendoneae. There are no ruptured chordae tendineae.  The severely elongated chordae tendoneae comprises cords from the anterior papillary muscle .  There is mild sclerosis of the anterior leaflet but no anterior leaflet prolapse.  Interrupted 2-0 Ethibond horizontal mattress sutures are placed circumferentially around the entire mitral valve annulus. The sutures will ultimately be utilized for ring annuloplasty, and at this juncture there are utilized to suspend the valve symmetrically.  The large redundant middle scallop of the posterior leaflet is repaired using a simple triangular resection. The resection is completed so as to shorten the height to some degree. The intervening vertical defect in the posterior leaflet is closed using interrupted everting simple CV-5 Gore-Tex sutures.  Artificial Gore-tex neochords are utilized  to support the middle scallop of the posterior leaflet. The appropriate length of the chordae tendineae to the posterior leaflet are measured and multistrand CV-4 Chord-X premeasured loops are utilized.  The pledgeted papillary muscle limb of the sutures are placed through the head of the anterior papillary muscle in a horizontal mattress fashion and tied.  Each of the 3 pairs of premeasured loops are subsequently implanted into the middle scallop of the posterior leaflet. All 6 cores are inserted onto the anterior side of P2, with one pair located on the posterior side of the vertical defect.  The valve was tested with saline and appeared competent even without ring annuloplasty complete. The valve was sized to a 34 mm annuloplasty ring, based upon the transverse distance between the left and right commissures and the height of the anterior leaflet, corresponding to a size just slightly larger than the overall surface area of the anterior leaflet.  A Sorin Memo 3D annuloplasty ring (size 34 mm, catalog #[id number], serial [id number]) was secured in place uneventfully. All ring sutures were secured using a Cor-knot device.  The valve was tested with saline and appeared competent.   The valve is again tested with saline and appears to be perfectly competent with a broad symmetrical line of coaptation of the anterior and posterior leaflet. There is no residual leak. There was a broad, symmetrical line of coaptation of the anterior and posterior leaflet which was confirmed using the blue ink test.  Rewarming is begun.   Procedure Completion:  The atriotomy was closed using a 2-layer closure of running 3-0 Prolene suture after placing a sump drain across the mitral valve to serve as a left ventricular vent.  One final dose of warm retrograde "hot shot" cardioplegia was administered retrograde through the coronary sinus catheter while all air was evacuated through the aortic root.  The aortic cross clamp was removed  after a total cross  clamp time of 105 minutes.  Epicardial pacing wires are fixed to the inferior wall of the right ventricule and to the right atrial appendage. The patient is rewarmed to 37C temperature. The left ventricular vent is removed.  The patient is ventilated and flow volumes turndown while the mitral valve repair is inspected using transesophageal echocardiogram. The valve repair appears intact with no residual leak. The antegrade cardioplegia cannula is now removed. The patient is weaned and disconnected from cardiopulmonary bypass.  The patient's rhythm at separation from bypass was sinus.  The patient was weaned from bypass without any inotropic support. Total cardiopulmonary bypass time for the operation was 150 minutes.  Followup transesophageal echocardiogram performed after separation from bypass revealed a well-seated annuloplasty ring in the mitral position with a normal functioning mitral valve. There was no residual leak.  Left ventricular function was unchanged from preoperatively.  The mean gradient across the mitral valve was estimated to be 2 mmHg.  The femoral arterial and venous cannulae were removed uneventfully. There was a palpable pulse in the distal right common femoral artery after removal of the cannula. Protamine was administered to reverse the anticoagulation. The right internal jugular cannula was removed and manual pressure held on the neck for 15 minutes.  Single lung ventilation was begun. The atriotomy closure was inspected for hemostasis. The pericardial sac was drained using a 28 French Bard drain placed through the anterior port incision.  The pericardium was closed using a patch of core matrix bovine submucosal tissue patch. The right pleural space is irrigated with saline solution and inspected for hemostasis. The right pleural space was drained using a 28 French Bard drain placed through the posterior port incision. The miniature thoracotomy incision was closed  in multiple layers in routine fashion. The right groin incision was inspected for hemostasis and closed in multiple layers in routine fashion.  The post-bypass portion of the operation was notable for stable rhythm and hemodynamics.  No blood products were administered during the operation.   Disposition:  The patient tolerated the procedure well.  The patient was reintubated using a single lumen endotracheal tube and subsequently transported to the surgical intensive care unit in stable condition. There were no intraoperative complications. All sponge instrument and needle counts are verified correct at completion of the operation.     Salvatore Decentlarence H. Cornelius Moraswen MD 03/06/2016 2:16 PM

## 2016-03-07 ENCOUNTER — Other Ambulatory Visit: Payer: Self-pay

## 2016-03-07 ENCOUNTER — Encounter (HOSPITAL_COMMUNITY): Payer: Self-pay | Admitting: Thoracic Surgery (Cardiothoracic Vascular Surgery)

## 2016-03-07 ENCOUNTER — Inpatient Hospital Stay (HOSPITAL_COMMUNITY): Payer: BC Managed Care – PPO

## 2016-03-07 LAB — POCT I-STAT 3, ART BLOOD GAS (G3+)
ACID-BASE DEFICIT: 6 mmol/L — AB (ref 0.0–2.0)
Bicarbonate: 20.5 mEq/L (ref 20.0–24.0)
O2 Saturation: 97 %
PH ART: 7.3 — AB (ref 7.350–7.450)
Patient temperature: 36.6
TCO2: 22 mmol/L (ref 0–100)
pCO2 arterial: 41.6 mmHg (ref 35.0–45.0)
pO2, Arterial: 96 mmHg (ref 80.0–100.0)

## 2016-03-07 LAB — POCT I-STAT, CHEM 8
BUN: 9 mg/dL (ref 6–20)
CHLORIDE: 103 mmol/L (ref 101–111)
CREATININE: 0.5 mg/dL (ref 0.44–1.00)
Calcium, Ion: 1.29 mmol/L (ref 1.13–1.30)
Glucose, Bld: 162 mg/dL — ABNORMAL HIGH (ref 65–99)
HEMATOCRIT: 33 % — AB (ref 36.0–46.0)
Hemoglobin: 11.2 g/dL — ABNORMAL LOW (ref 12.0–15.0)
POTASSIUM: 4.1 mmol/L (ref 3.5–5.1)
SODIUM: 138 mmol/L (ref 135–145)
TCO2: 20 mmol/L (ref 0–100)

## 2016-03-07 LAB — GLUCOSE, CAPILLARY
GLUCOSE-CAPILLARY: 103 mg/dL — AB (ref 65–99)
GLUCOSE-CAPILLARY: 104 mg/dL — AB (ref 65–99)
GLUCOSE-CAPILLARY: 109 mg/dL — AB (ref 65–99)
GLUCOSE-CAPILLARY: 110 mg/dL — AB (ref 65–99)
GLUCOSE-CAPILLARY: 122 mg/dL — AB (ref 65–99)
GLUCOSE-CAPILLARY: 125 mg/dL — AB (ref 65–99)
GLUCOSE-CAPILLARY: 125 mg/dL — AB (ref 65–99)
GLUCOSE-CAPILLARY: 130 mg/dL — AB (ref 65–99)
GLUCOSE-CAPILLARY: 84 mg/dL (ref 65–99)
GLUCOSE-CAPILLARY: 93 mg/dL (ref 65–99)
GLUCOSE-CAPILLARY: 95 mg/dL (ref 65–99)
Glucose-Capillary: 101 mg/dL — ABNORMAL HIGH (ref 65–99)
Glucose-Capillary: 103 mg/dL — ABNORMAL HIGH (ref 65–99)
Glucose-Capillary: 106 mg/dL — ABNORMAL HIGH (ref 65–99)
Glucose-Capillary: 108 mg/dL — ABNORMAL HIGH (ref 65–99)
Glucose-Capillary: 109 mg/dL — ABNORMAL HIGH (ref 65–99)
Glucose-Capillary: 121 mg/dL — ABNORMAL HIGH (ref 65–99)
Glucose-Capillary: 124 mg/dL — ABNORMAL HIGH (ref 65–99)
Glucose-Capillary: 142 mg/dL — ABNORMAL HIGH (ref 65–99)
Glucose-Capillary: 151 mg/dL — ABNORMAL HIGH (ref 65–99)
Glucose-Capillary: 157 mg/dL — ABNORMAL HIGH (ref 65–99)
Glucose-Capillary: 96 mg/dL (ref 65–99)
Glucose-Capillary: 98 mg/dL (ref 65–99)

## 2016-03-07 LAB — CBC
HCT: 35.1 % — ABNORMAL LOW (ref 36.0–46.0)
HEMATOCRIT: 34.2 % — AB (ref 36.0–46.0)
HEMOGLOBIN: 11.9 g/dL — AB (ref 12.0–15.0)
Hemoglobin: 11.6 g/dL — ABNORMAL LOW (ref 12.0–15.0)
MCH: 32.2 pg (ref 26.0–34.0)
MCH: 31.2 pg (ref 26.0–34.0)
MCHC: 34.8 g/dL (ref 30.0–36.0)
MCHC: 33 g/dL (ref 30.0–36.0)
MCV: 92.7 fL (ref 78.0–100.0)
MCV: 94.4 fL (ref 78.0–100.0)
PLATELETS: 139 10*3/uL — AB (ref 150–400)
Platelets: 159 10*3/uL (ref 150–400)
RBC: 3.69 MIL/uL — AB (ref 3.87–5.11)
RBC: 3.72 MIL/uL — ABNORMAL LOW (ref 3.87–5.11)
RDW: 13.9 % (ref 11.5–15.5)
RDW: 14.2 % (ref 11.5–15.5)
WBC: 17.3 10*3/uL — ABNORMAL HIGH (ref 4.0–10.5)
WBC: 17.6 10*3/uL — ABNORMAL HIGH (ref 4.0–10.5)

## 2016-03-07 LAB — BASIC METABOLIC PANEL
Anion gap: 9 (ref 5–15)
BUN: 8 mg/dL (ref 6–20)
CHLORIDE: 113 mmol/L — AB (ref 101–111)
CO2: 22 mmol/L (ref 22–32)
CREATININE: 0.6 mg/dL (ref 0.44–1.00)
Calcium: 8.2 mg/dL — ABNORMAL LOW (ref 8.9–10.3)
GFR calc Af Amer: >60 mL/min (ref >60)
GFR calc non Af Amer: >60 mL/min (ref >60)
Glucose, Bld: 112 mg/dL — ABNORMAL HIGH (ref 65–99)
POTASSIUM: 3.6 mmol/L (ref 3.5–5.1)
Sodium: 144 mmol/L (ref 135–145)

## 2016-03-07 LAB — MAGNESIUM
Magnesium: 2.5 mg/dL — ABNORMAL HIGH (ref 1.7–2.4)
Magnesium: 2.2 mg/dL (ref 1.7–2.4)

## 2016-03-07 LAB — CREATININE, SERUM
Creatinine, Ser: 0.69 mg/dL (ref 0.44–1.00)
GFR calc Af Amer: >60 mL/min (ref >60)
GFR calc non Af Amer: >60 mL/min (ref >60)

## 2016-03-07 MED ORDER — ALPRAZOLAM 0.25 MG PO TABS: 0.2500 mg | ORAL_TABLET | Freq: Three times a day (TID) | ORAL | Status: DC | PRN

## 2016-03-07 MED ORDER — KETOROLAC TROMETHAMINE 15 MG/ML IJ SOLN
15.0000 mg | Freq: Four times a day (QID) | INTRAMUSCULAR | Status: DC
  Administered 2016-03-07 – 2016-03-08 (×4): 15 mg via INTRAVENOUS
  Filled 2016-03-07 (×5): qty 1

## 2016-03-07 MED ORDER — INSULIN DETEMIR 100 UNIT/ML ~~LOC~~ SOLN
20.0000 [IU] | Freq: Once | SUBCUTANEOUS | Status: AC
  Administered 2016-03-07: 20 [IU] via SUBCUTANEOUS
  Filled 2016-03-07: qty 0.2

## 2016-03-07 MED ORDER — POTASSIUM CHLORIDE 10 MEQ/50ML IV SOLN
10.0000 meq | INTRAVENOUS | Status: AC
  Administered 2016-03-07 (×3): 10 meq via INTRAVENOUS

## 2016-03-07 MED ORDER — WARFARIN SODIUM 2.5 MG PO TABS
2.5000 mg | ORAL_TABLET | Freq: Every day | ORAL | Status: DC
  Administered 2016-03-07 – 2016-03-09 (×3): 2.5 mg via ORAL
  Filled 2016-03-07 (×3): qty 1

## 2016-03-07 MED ORDER — WARFARIN - PHYSICIAN DOSING INPATIENT: Freq: Every day | Status: DC

## 2016-03-07 MED ORDER — FLUOXETINE HCL 20 MG PO CAPS
40.0000 mg | ORAL_CAPSULE | Freq: Every day | ORAL | Status: DC
  Administered 2016-03-08 – 2016-03-12 (×5): 40 mg via ORAL
  Filled 2016-03-07 (×5): qty 2

## 2016-03-07 MED ORDER — INSULIN ASPART 100 UNIT/ML ~~LOC~~ SOLN
0.0000 [IU] | SUBCUTANEOUS | Status: DC
Start: 2016-03-07 — End: 2016-03-08

## 2016-03-07 MED ORDER — MORPHINE SULFATE (PF) 2 MG/ML IV SOLN
1.0000 mg | INTRAVENOUS | Status: DC | PRN
  Administered 2016-03-07 – 2016-03-08 (×4): 2 mg via INTRAVENOUS
  Filled 2016-03-07 (×4): qty 1

## 2016-03-07 MED ORDER — POTASSIUM CHLORIDE 10 MEQ/50ML IV SOLN
10.0000 meq | Freq: Once | INTRAVENOUS | Status: AC
  Administered 2016-03-07: 10 meq via INTRAVENOUS
  Filled 2016-03-07: qty 50

## 2016-03-07 MED FILL — Dexmedetomidine HCl in NaCl 0.9% IV Soln 400 MCG/100ML: INTRAVENOUS | Qty: 100 | Status: AC

## 2016-03-07 NOTE — Progress Notes (Signed)
      [address]       [place] [id number]             [id number]      Resting comfortably  BP 95/66 mmHg  Pulse 80  Temp(Src) 98.2 F (36.8 C) (Oral)  Resp 17  Ht 5\' 6"  (1.676 m)  Wt 156 lb 15.5 oz (71.2 kg)  BMI 25.35 kg/m2  SpO2 96%  Weaning off neo  Intake/Output Summary (Last 24 hours) at 03/07/16 1816 Last data filed at 03/07/16 1700  Gross per 24 hour  Intake 2746.18 ml  Output   2270 ml  Net 476.18 ml   POD # 1 Mitral repair  Creatinine 0.69  Doing well  Viviann SpareSteven C. Dorris FetchHendrickson, MD [facility] [phone]

## 2016-03-07 NOTE — Progress Notes (Signed)
UR Completed. Liara Holm, RN, BSN.  [phone] 

## 2016-03-07 NOTE — Progress Notes (Signed)
[address]       [place] [id number]             [phone]        CARDIOTHORACIC SURGERY PROGRESS NOTE   R1 Day Post-Op Procedure(s) (LRB): MINIMALLY INVASIVE MITRAL VALVE REPAIR (MVR) using a 34 Sorin Memo 3D Ring (Right) TRANSESOPHAGEAL ECHOCARDIOGRAM (TEE) (N/A) CLIPPING OF LEFT ATRIAL APPENDAGE using a 45 PRO2 AtriClip (Left)  Subjective: Looks good.  Mild soreness in chest.  No SOB  Objective: Vital signs: BP Readings from Last 1 Encounters:  03/07/16 96/73   Pulse Readings from Last 1 Encounters:  03/07/16 80   Resp Readings from Last 1 Encounters:  03/07/16 16   Temp Readings from Last 1 Encounters:  03/07/16 99 F (37.2 C)     Hemodynamics: PAP: (14-43)/(7-19) 24/15 mmHg CO:  [3.1 L/min-5 L/min] 4.7 L/min CI:  [1.8 L/min/m2-2.9 L/min/m2] 2.6 L/min/m2  Physical Exam:  Rhythm:   Sinus brady 40-50, AAI pacing  Breath sounds: clear  Heart sounds:  RRR w/out murmur  Incisions:  Dressings dry, intact  Abdomen:  Soft, non-distended, non-tender  Extremities:  Warm, well-perfused  Chest tubes:  Low volume thin serosanguinous output, no air leak    Intake/Output from previous day: 03/08 0701 - 03/09 0700 In: 7477.2 [I.V.:4812.2; Blood:480; NG/GT:60; IV Piggyback:2125] Out: 4645 [Urine:3095; Blood:1000; Chest Tube:550] Intake/Output this shift:    Lab Results:  CBC: Recent Labs  03/06/16 2130 03/06/16 2135 03/07/16 0350  WBC 16.0*  --  17.3*  HGB 12.3 11.9* 11.9*  HCT 35.6* 35.0* 34.2*  PLT 137*  --  159    BMET:  Recent Labs  03/04/16 1236  03/06/16 2135 03/07/16 0350  NA 141  < > 142 144  K 3.4*  < > 3.8 3.6  CL 110  < > 111 113*  CO2 18*  --   --  22  GLUCOSE 126*  < > 168* 112*  BUN 13  < > 11 8  CREATININE 0.75  < > 0.60 0.60  CALCIUM 9.6  --   --  8.2*  < > = values in this interval not displayed.   PT/INR:   Recent Labs  03/06/16 1448  LABPROT 16.4*  INR 1.31    CBG (last 3)   Recent Labs  03/06/16 2133 03/06/16 2238 03/06/16 2331  GLUCAP 142* 124* 125*    ABG    Component Value Date/Time   PHART 7.300* 03/07/2016 0031   PCO2ART 41.6 03/07/2016 0031   PO2ART 96.0 03/07/2016 0031   HCO3 20.5 03/07/2016 0031   TCO2 22 03/07/2016 0031   ACIDBASEDEF 6.0* 03/07/2016 0031   O2SAT 97.0 03/07/2016 0031    CXR: Clear, mild R basilar atelectasis  Assessment/Plan: S/P Procedure(s) (LRB): MINIMALLY INVASIVE MITRAL VALVE REPAIR (MVR) using a 34 Sorin Memo 3D Ring (Right) TRANSESOPHAGEAL ECHOCARDIOGRAM (TEE) (N/A) CLIPPING OF LEFT ATRIAL APPENDAGE using a 45 PRO2 AtriClip (Left)  Doing very well POD1 Maintaining AAI paced rhythm w/ stable hemodynamics, on low dose Neo for BP support Breathing comfortably w/ O2 sats 96-99% on 2 L/min via [place] Sinus brady under pacer Expected post op acute blood loss anemia, mild, stable Expected post op atelectasis, mild, R>L Expected post op volume excess, mild   Stop amiodarone drip and hold beta blockers for now  Continue AAI pacing  Wean Neo as tolerated  Hold diuretics until BP stable off Neo  Mobilize  D/C lines  Keep chest tubes in  Start coumadin slowly  Transfer step down once stable off Neo   Purcell Nails, MD 03/07/2016 7:31 AM

## 2016-03-07 NOTE — Care Management Note (Signed)
Case Management Note  Patient Details  Name: Kathrynn RunningSandra H Polito MRN: [id number] Date of Birth: 04/26/1951  Subjective/Objective:          S/p Mini MVR          Action/Plan:  PTA -  Independent per husband.  Pt lives with husband and he states he will provide 24 hour supervision for pt at discharge   Expected Discharge Date:                  Expected Discharge Plan:  Home/Self Care  In-House Referral:     Discharge planning Services  CM Consult  Post Acute Care Choice:    Choice offered to:     DME Arranged:    DME Agency:     HH Arranged:    HH Agency:     Status of Service:  In process, will continue to follow  Medicare Important Message Given:    Date Medicare IM Given:    Medicare IM give by:    Date Additional Medicare IM Given:    Additional Medicare Important Message give by:     If discussed at Long Length of Stay Meetings, dates discussed:    Additional Comments:  Cherylann ParrClaxton, Ruther Ephraim S, RN 03/07/2016, 10:54 AM

## 2016-03-07 NOTE — Discharge Instructions (Addendum)
Information on my medicine - Coumadin   (Warfarin)  This medication education was reviewed with me or my healthcare representative as part of my discharge preparation.  The pharmacist that spoke with me during my hospital stay was:  Romona Curls, [facility]  Why was Coumadin prescribed for you? Coumadin was prescribed for you because you have a blood clot or a medical condition that can cause an increased risk of forming blood clots. Blood clots can cause serious health problems by blocking the flow of blood to the heart, lung, or brain. Coumadin can prevent harmful blood clots from forming. As a reminder your indication for Coumadin is:   Blood Clot Prevention After Heart Valve Surgery  What test will check on my response to Coumadin? While on Coumadin (warfarin) you will need to have an INR test regularly to ensure that your dose is keeping you in the desired range. The INR (international normalized ratio) number is calculated from the result of the laboratory test called prothrombin time (PT).  If an INR APPOINTMENT HAS NOT ALREADY BEEN MADE FOR YOU please schedule an appointment to have this lab work done by your health care provider within 7 days. Your INR goal is usually a number between:  2 to 3 or your provider may give you a more narrow range like 2-2.5.  Ask your health care provider during an office visit what your goal INR is.  What  do you need to  know  About  COUMADIN? Take Coumadin (warfarin) exactly as prescribed by your healthcare provider about the same time each day.  DO NOT stop taking without talking to the doctor who prescribed the medication.  Stopping without other blood clot prevention medication to take the place of Coumadin may increase your risk of developing a new clot or stroke.  Get refills before you run out.  What do you do if you miss a dose? If you miss a dose, take it as soon as you remember on the same day then continue your regularly scheduled regimen the next  day.  Do not take two doses of Coumadin at the same time.  Important Safety Information A possible side effect of Coumadin (Warfarin) is an increased risk of bleeding. You should call your healthcare provider right away if you experience any of the following: ? Bleeding from an injury or your nose that does not stop. ? Unusual colored urine (red or dark brown) or unusual colored stools (red or black). ? Unusual bruising for unknown reasons. ? A serious fall or if you hit your head (even if there is no bleeding).  Some foods or medicines interact with Coumadin (warfarin) and might alter your response to warfarin. To help avoid this: ? Eat a balanced diet, maintaining a consistent amount of Vitamin K. ? Notify your provider about major diet changes you plan to make. ? Avoid alcohol or limit your intake to 1 drink for women and 2 drinks for men per day. (1 drink is 5 oz. wine, 12 oz. beer, or 1.5 oz. liquor.)  Make sure that ANY health care provider who prescribes medication for you knows that you are taking Coumadin (warfarin).  Also make sure the healthcare provider who is monitoring your Coumadin knows when you have started a new medication including herbals and non-prescription products.  Coumadin (Warfarin)  Major Drug Interactions  Increased Warfarin Effect Decreased Warfarin Effect  Alcohol (large quantities) Antibiotics (esp. Septra/Bactrim, Flagyl, Cipro) Amiodarone (Cordarone) Aspirin (ASA) Cimetidine (Tagamet) Megestrol (Megace) NSAIDs (  ibuprofen, naproxen, etc.) Piroxicam (Feldene) Propafenone (Rythmol SR) Propranolol (Inderal) Isoniazid (INH) Posaconazole (Noxafil) Barbiturates (Phenobarbital) Carbamazepine (Tegretol) Chlordiazepoxide (Librium) Cholestyramine (Questran) Griseofulvin Oral Contraceptives Rifampin Sucralfate (Carafate) Vitamin K   Coumadin (Warfarin) Major Herbal Interactions  Increased Warfarin Effect Decreased Warfarin Effect   Garlic Ginseng Ginkgo biloba Coenzyme Q10 Green tea St. Johns wort    Coumadin (Warfarin) FOOD Interactions  Eat a consistent number of servings per week of foods HIGH in Vitamin K (1 serving =  cup)  Collards (cooked, or boiled & drained) Kale (cooked, or boiled & drained) Mustard greens (cooked, or boiled & drained) Parsley *serving size only =  cup Spinach (cooked, or boiled & drained) Swiss chard (cooked, or boiled & drained) Turnip greens (cooked, or boiled & drained)  Eat a consistent number of servings per week of foods MEDIUM-HIGH in Vitamin K (1 serving = 1 cup)  Asparagus (cooked, or boiled & drained) Broccoli (cooked, boiled & drained, or raw & chopped) Brussel sprouts (cooked, or boiled & drained) *serving size only =  cup Lettuce, raw (green leaf, endive, romaine) Spinach, raw Turnip greens, raw & chopped  Thoracotomy, Care After  These instructions give you information on caring for yourself after your procedure. Your doctor may also give you more specific instructions. Call your doctor if you have any problems or questions after your procedure. HOME CARE  Only take medicine as told by your doctor. Take pain medicine before your pain gets very bad.  Take deep breaths to protect yourself from a lung infection (pneumonia).  Cough often to clear thick spit (mucus) and to open your lungs. Hold a pillow against your chest if it hurts to cough.  Use your breathing machine (incentive spirometer) as told.  Change bandages (dressings) as told by your doctor.  Take off the bandage over your chest tube area as told by your doctor.  Continue your normal diet as told by your doctor.  Eat high-fiber foods. This includes whole grain cereals, brown rice, beans, and fresh fruits and vegetables.  Drink enough fluids to keep your pee (urine) clear or pale yellow. Avoid caffeine.  Talk to your doctor about taking a medicine to soften your poop (stool softener,  laxative).  Keep doing activities as told by your doctor. Balance your activity with rest.  Avoid lifting until your doctor says it is okay.  Do not drive until told by your doctor. Do not drive while taking pain medicines (narcotics).  Do not bathe, swim, or use a hot tub until told by your doctor. You may shower. Gently wash the area of your cut (incision) with soap and water as told.  Do not use any tobacco products including cigarettes, chewing tobacco, and electronic cigarettes. Avoid being around others who smoke.  Schedule a doctor visit to get your stitches or staples removed as told.  Schedule and go to all doctor visits as told.  Go to therapy that can help improve your lungs (pulmonary rehabilitation) as told by your doctor.  Do not travel by airplane for 2 weeks after the chest tube is taken out. GET HELP IF:  You are bleeding from your wounds.  You have redness, puffiness (swelling), or more pain in the wounds.  You feel your heart beating fast or skipping beats (irregular heartbeat).  You have yellowish-white fluid (pus) coming from your wounds.  You have a bad smell coming from the wound or bandage.  You have a fever or chills.  You feel sick to your stomach  or you throw up (vomit).  You have muscle aches. GET HELP RIGHT AWAY IF:  You have a rash.  You have trouble breathing.  Your medicines are causing you problems.  You keep feeling sick to your stomach (nauseous).  You feel light-headed.  You have shortness of breath or chest pain.  You have pain that will not go away. MAKE SURE YOU:  Understand these instructions.  Will watch your condition.  Will get help right away if you are not doing well or get worse.   This information is not intended to replace advice given to you by your health care provider. Make sure you discuss any questions you have with your health care provider.   Document Released: 06/16/2012 Document Revised: 01/06/2015  Document Reviewed: 08/04/2013 Elsevier Interactive Patient Education [employer]  These websites have more information on Coumadin (warfarin):  http://www.king-russell.com/; https://www.hines.net/;  Mitral Valve Repair, Care After Refer to this sheet in the next few weeks. These instructions provide you with information on caring for yourself after your procedure. Your health care provider may also give you specific instructions. Your treatment has been planned according to current medical practices, but problems sometimes occur. Call your health care provider if you have any problems or questions after your procedure.  HOME CARE INSTRUCTIONS   Take medicines only as directed by your health care provider.  Take your temperature every morning for the first 7 days after surgery. Write these down.  Weigh yourself every morning for at least 7 days after surgery. Write your weight down.  Wear elastic stockings during the day for at least 2 weeks after surgery or as directed by your health care provider. Use them longer if your ankles are swollen. The stockings help blood flow and help reduce swelling in the legs.  Take frequent naps or rest often throughout the day.  Avoid lifting more than 10 lb (4.5 kg) or pushing or pulling things with your arms for 6-8 weeks or as directed by your health care provider.  Avoid driving or airplane travel for 4-6 weeks after surgery or as directed. If you are riding in a car for an extended period, stop every 1-2 hours to stretch your legs.  Avoid crossing your legs.  Avoid climbing stairs and using the handrail to pull yourself up for the first 2-3 weeks after surgery.  Do not take baths for 2-4 weeks after surgery. Take showers once your health care provider approves. Pat incisions dry. Do not rub incisions with a washcloth or towel.  Return to work as directed by your health care provider.  Drink enough fluids to keep your urine clear or pale  yellow.  Do not strain to have a bowel movement. Eat high-fiber foods if you become constipated. You may also take a medicine to help you have a bowel movement (laxative) as directed by your health care provider.  Resume sexual activity as directed by your health care provider. SEEK MEDICAL CARE IF:   You develop a skin rash.   Your weight is increasing each day over 2-3 days.  Your weight increases by 2 or more pounds (1 kg) in a single day.  You have a fever. SEEK IMMEDIATE MEDICAL CARE IF:   You develop chest pain that is not coming from your incision.  You develop shortness of breath or difficulty breathing.  You have drainage, redness, swelling, or pain at your incision site.  You have pus coming from your incision.  You develop light-headedness. MAKE  SURE YOU:  Understand these directions.  Will watch your condition.  Will get help right away if you are not doing well or get worse.   This information is not intended to replace advice given to you by your health care provider. Make sure you discuss any questions you have with your health care provider.   Document Released: 07/05/2005 Document Revised: 01/06/2015 Document Reviewed: 05/18/2013 Elsevier Interactive Patient Education [employer]2016 Elsevier Inc.

## 2016-03-08 ENCOUNTER — Inpatient Hospital Stay (HOSPITAL_COMMUNITY): Payer: BC Managed Care – PPO

## 2016-03-08 LAB — GLUCOSE, CAPILLARY
GLUCOSE-CAPILLARY: 100 mg/dL — AB (ref 65–99)
GLUCOSE-CAPILLARY: 109 mg/dL — AB (ref 65–99)
Glucose-Capillary: 91 mg/dL (ref 65–99)
Glucose-Capillary: 103 mg/dL — ABNORMAL HIGH (ref 65–99)

## 2016-03-08 LAB — BASIC METABOLIC PANEL
Anion gap: 10 (ref 5–15)
BUN: 12 mg/dL (ref 6–20)
CHLORIDE: 108 mmol/L (ref 101–111)
CO2: 21 mmol/L — AB (ref 22–32)
Calcium: 8.7 mg/dL — ABNORMAL LOW (ref 8.9–10.3)
Creatinine, Ser: 0.75 mg/dL (ref 0.44–1.00)
GFR calc Af Amer: >60 mL/min (ref >60)
GFR calc non Af Amer: >60 mL/min (ref >60)
GLUCOSE: 106 mg/dL — AB (ref 65–99)
POTASSIUM: 4.1 mmol/L (ref 3.5–5.1)
Sodium: 139 mmol/L (ref 135–145)

## 2016-03-08 LAB — CBC
HEMATOCRIT: 34.4 % — AB (ref 36.0–46.0)
Hemoglobin: 11.3 g/dL — ABNORMAL LOW (ref 12.0–15.0)
MCH: 30.9 pg (ref 26.0–34.0)
MCHC: 32.8 g/dL (ref 30.0–36.0)
MCV: 94 fL (ref 78.0–100.0)
PLATELETS: 129 10*3/uL — AB (ref 150–400)
RBC: 3.66 MIL/uL — ABNORMAL LOW (ref 3.87–5.11)
RDW: 14.2 % (ref 11.5–15.5)
WBC: 14.6 10*3/uL — ABNORMAL HIGH (ref 4.0–10.5)

## 2016-03-08 LAB — PROTIME-INR
INR: 1.37 (ref 0.00–1.49)
Prothrombin Time: 17 seconds — ABNORMAL HIGH (ref 11.6–15.2)

## 2016-03-08 MED ORDER — FUROSEMIDE 10 MG/ML IJ SOLN
20.0000 mg | Freq: Once | INTRAMUSCULAR | Status: AC
  Administered 2016-03-08: 20 mg via INTRAVENOUS
  Filled 2016-03-08: qty 2

## 2016-03-08 MED ORDER — FUROSEMIDE 40 MG PO TABS: 40.0000 mg | ORAL_TABLET | Freq: Every day | ORAL | Status: DC

## 2016-03-08 MED ORDER — SODIUM CHLORIDE 0.9% FLUSH
3.0000 mL | Freq: Two times a day (BID) | INTRAVENOUS | Status: DC
  Administered 2016-03-08 – 2016-03-11 (×4): 3 mL via INTRAVENOUS

## 2016-03-08 MED ORDER — SODIUM CHLORIDE 0.9 % IV SOLN: 250.0000 mL | INTRAVENOUS | Status: DC | PRN

## 2016-03-08 MED ORDER — FUROSEMIDE 40 MG PO TABS
40.0000 mg | ORAL_TABLET | Freq: Two times a day (BID) | ORAL | Status: DC
  Administered 2016-03-09 – 2016-03-10 (×3): 40 mg via ORAL
  Filled 2016-03-08 (×2): qty 1

## 2016-03-08 MED ORDER — POTASSIUM CHLORIDE CRYS ER 20 MEQ PO TBCR: 20.0000 meq | EXTENDED_RELEASE_TABLET | Freq: Every day | ORAL | Status: DC

## 2016-03-08 MED ORDER — AMIODARONE HCL 200 MG PO TABS
200.0000 mg | ORAL_TABLET | Freq: Every day | ORAL | Status: DC
  Administered 2016-03-09: 200 mg via ORAL
  Filled 2016-03-08: qty 1

## 2016-03-08 MED ORDER — POTASSIUM CHLORIDE CRYS ER 20 MEQ PO TBCR
20.0000 meq | EXTENDED_RELEASE_TABLET | Freq: Two times a day (BID) | ORAL | Status: AC
  Administered 2016-03-09 – 2016-03-11 (×6): 20 meq via ORAL
  Filled 2016-03-08 (×6): qty 1

## 2016-03-08 MED ORDER — MOVING RIGHT ALONG BOOK
Freq: Once | Status: AC
  Administered 2016-03-08: 09:00:00
  Filled 2016-03-08: qty 1

## 2016-03-08 MED ORDER — SODIUM CHLORIDE 0.9% FLUSH: 3.0000 mL | INTRAVENOUS | Status: DC | PRN

## 2016-03-08 MED ORDER — ASPIRIN 325 MG PO TABS
325.0000 mg | ORAL_TABLET | Freq: Every day | ORAL | Status: DC
Start: 2016-03-08 — End: 2016-03-11
  Administered 2016-03-08 – 2016-03-10 (×3): 325 mg via ORAL
  Filled 2016-03-08 (×3): qty 1

## 2016-03-08 NOTE — Progress Notes (Signed)
[address]       [name] [address]             [phone]        CARDIOTHORACIC SURGERY PROGRESS NOTE   R2 Days Post-Op Procedure(s) (LRB): MINIMALLY INVASIVE MITRAL VALVE REPAIR (MVR) using a 34 Sorin Memo 3D Ring (Right) TRANSESOPHAGEAL ECHOCARDIOGRAM (TEE) (N/A) CLIPPING OF LEFT ATRIAL APPENDAGE using a 45 PRO2 AtriClip (Left)  Subjective: Looks great.  Mild soreness in chest.  Breathing comfortably.  Hungry for breakfast.  Objective: Vital signs: BP Readings from Last 1 Encounters:  03/08/16 108/77   Pulse Readings from Last 1 Encounters:  03/08/16 79   Resp Readings from Last 1 Encounters:  03/08/16 16   Temp Readings from Last 1 Encounters:  03/08/16 99 F (37.2 C) Oral    Hemodynamics: PAP: (26)/(15) 26/15 mmHg  Physical Exam:  Rhythm:   Sinus - AAI paced  Breath sounds: clear  Heart sounds:  RRR w/out murmur  Incisions:  Dressings dry, intact  Abdomen:  Soft, non-distended, non-tender  Extremities:  Warm, well-perfused  Chest tubes:  Decreasing volume thin serosanguinous output, no air leak    Intake/Output from previous day: 03/09 0701 - 03/10 0700 In: 482.1 [I.V.:382.1; IV Piggyback:100] Out: 1520 [Urine:925; Chest Tube:595] Intake/Output this shift:    Lab Results:  CBC: Recent Labs  03/07/16 1640 03/08/16 0740  WBC 17.6* 14.6*  HGB 11.6* 11.3*  HCT 35.1* 34.4*  PLT 139* 129*    BMET:  Recent Labs  03/07/16 0350 03/07/16 1623 03/07/16 1640 03/08/16 0435  NA 144 138  --  139  K 3.6 4.1  --  4.1  CL 113* 103  --  108  CO2 22  --   --  21*  GLUCOSE 112* 162*  --  106*  BUN 8 9  --  12  CREATININE 0.60 0.50 0.69 0.75  CALCIUM 8.2*  --   --  8.7*     PT/INR:   Recent Labs  03/08/16 0435  LABPROT 17.0*  INR 1.37    CBG (last 3)   Recent Labs  03/07/16 1924 03/07/16 2341 03/08/16 0353  GLUCAP 98 104* 91    ABG    Component Value Date/Time   PHART 7.300* 03/07/2016 0031   PCO2ART  41.6 03/07/2016 0031   PO2ART 96.0 03/07/2016 0031   HCO3 20.5 03/07/2016 0031   TCO2 20 03/07/2016 1623   ACIDBASEDEF 6.0* 03/07/2016 0031   O2SAT 97.0 03/07/2016 0031    CXR: PORTABLE CHEST 1 VIEW  COMPARISON: 03/07/2016  FINDINGS: Cardiac shadow is mildly enlarged. Postsurgical changes are again seen. A left jugular sheath is again noted. The Swan-Ganz catheter has been removed. Two right thoracostomy catheters are again seen. No pneumothorax is noted. Minimal right basilar atelectasis is again seen.  IMPRESSION: No significant interval change from the prior exam.   Electronically Signed  By: Alcide Clever M.D.  On: 03/08/2016 07:55  Assessment/Plan: S/P Procedure(s) (LRB): MINIMALLY INVASIVE MITRAL VALVE REPAIR (MVR) using a 34 Sorin Memo 3D Ring (Right) TRANSESOPHAGEAL ECHOCARDIOGRAM (TEE) (N/A) CLIPPING OF LEFT ATRIAL APPENDAGE using a 45 PRO2 AtriClip (Left)  Overall doing very well POD2 Maintaining NSR - AAI paced rhythm w/ stable BP although still on low dose Neo drip Breathing very comfortably w/ O2 sats 97% on RA and CXR clear Expected post op acute blood loss anemia, mild, stable Expected post op atelectasis, mild, R>L Expected post op volume excess, mild  Mobilize  D/C foley  Wean Neo off  Transfer Step down once stable off Neo  Leave chest tubes until output decreases further  Coumadin  Purcell Nailslarence H Owen, MD 03/08/2016 8:58 AM

## 2016-03-09 LAB — BASIC METABOLIC PANEL
ANION GAP: 8 (ref 5–15)
BUN: 12 mg/dL (ref 6–20)
CALCIUM: 8.6 mg/dL — AB (ref 8.9–10.3)
CO2: 25 mmol/L (ref 22–32)
CREATININE: 0.55 mg/dL (ref 0.44–1.00)
Chloride: 102 mmol/L (ref 101–111)
Glucose, Bld: 117 mg/dL — ABNORMAL HIGH (ref 65–99)
Potassium: 3.6 mmol/L (ref 3.5–5.1)
SODIUM: 135 mmol/L (ref 135–145)

## 2016-03-09 LAB — CBC
HEMATOCRIT: 33.4 % — AB (ref 36.0–46.0)
Hemoglobin: 11.1 g/dL — ABNORMAL LOW (ref 12.0–15.0)
MCH: 30.7 pg (ref 26.0–34.0)
MCHC: 33.2 g/dL (ref 30.0–36.0)
MCV: 92.3 fL (ref 78.0–100.0)
Platelets: 135 10*3/uL — ABNORMAL LOW (ref 150–400)
RBC: 3.62 MIL/uL — ABNORMAL LOW (ref 3.87–5.11)
RDW: 13.5 % (ref 11.5–15.5)
WBC: 12.2 10*3/uL — AB (ref 4.0–10.5)

## 2016-03-09 LAB — PROTIME-INR
INR: 1.21 (ref 0.00–1.49)
PROTHROMBIN TIME: 15.4 s — AB (ref 11.6–15.2)

## 2016-03-09 MED ORDER — WARFARIN VIDEO: Freq: Once | Status: DC

## 2016-03-09 MED ORDER — PATIENT'S GUIDE TO USING COUMADIN BOOK
Freq: Once | Status: DC
Start: 2016-03-09 — End: 2016-03-12
  Filled 2016-03-09: qty 1

## 2016-03-09 NOTE — Progress Notes (Addendum)
[address]       [place] [id number]             [phone]      3 Days Post-Op Procedure(s) (LRB): MINIMALLY INVASIVE MITRAL VALVE REPAIR (MVR) using a 34 Sorin Memo 3D Ring (Right) TRANSESOPHAGEAL ECHOCARDIOGRAM (TEE) (N/A) CLIPPING OF LEFT ATRIAL APPENDAGE using a 45 PRO2 AtriClip (Left) Subjective:  C/O nausea  Objective: Vital signs in last 24 hours: Temp:  [97.5 F (36.4 C)-99 F (37.2 C)] 97.8 F (36.6 C) (03/11 0629) Pulse Rate:  [78-84] 81 (03/11 0629) Cardiac Rhythm:  [-] Atrial paced (03/10 1945) Resp:  [12-28] 28 (03/10 2017) BP: (97-128)/(69-86) 119/86 mmHg (03/11 0629) SpO2:  [95 %-100 %] 97 % (03/11 0629) Weight:  [155 lb 12.8 oz (70.67 kg)] 155 lb 12.8 oz (70.67 kg) (03/11 0629)  Hemodynamic parameters for last 24 hours:    Intake/Output from previous day: 03/10 0701 - 03/11 0700 In: 160 [P.O.:160] Out: 1540 [Urine:1350; Chest Tube:190] Intake/Output this shift:    General appearance: alert, cooperative, fatigued and no distress Heart: regular rate and rhythm and no murmur Lungs: dim in bases Abdomen: benign Extremities: some edema Wound: incis ok  Lab Results:  Recent Labs  03/08/16 0740 03/09/16 0401  WBC 14.6* 12.2*  HGB 11.3* 11.1*  HCT 34.4* 33.4*  PLT 129* 135*   BMET:  Recent Labs  03/08/16 0435 03/09/16 0401  NA 139 135  K 4.1 3.6  CL 108 102  CO2 21* 25  GLUCOSE 106* 117*  BUN 12 12  CREATININE 0.75 0.55  CALCIUM 8.7* 8.6*    PT/INR:  Recent Labs  03/09/16 0401  LABPROT 15.4*  INR 1.21   ABG    Component Value Date/Time   PHART 7.300* 03/07/2016 0031   HCO3 20.5 03/07/2016 0031   TCO2 20 03/07/2016 1623   ACIDBASEDEF 6.0* 03/07/2016 0031   O2SAT 97.0 03/07/2016 0031   CBG (last 3)   Recent Labs  03/08/16 0849 03/08/16 1217 03/08/16 1621  GLUCAP 100* 103* 109*    Meds Scheduled Meds: . acetaminophen  1,000 mg Oral 4 times per day  . amiodarone  200 mg Oral Daily  .  aspirin  325 mg Oral Daily  . bisacodyl  10 mg Oral Daily   Or  . bisacodyl  10 mg Rectal Daily  . docusate sodium  200 mg Oral Daily  . FLUoxetine  40 mg Oral Daily  . furosemide  40 mg Oral BID  . pantoprazole  40 mg Oral Daily  . potassium chloride  20 mEq Oral BID  . sodium chloride flush  10-40 mL Intracatheter Q12H  . sodium chloride flush  3 mL Intravenous Q12H  . sodium chloride flush  3 mL Intravenous Q12H  . warfarin  2.5 mg Oral q1800  . Warfarin - Physician Dosing Inpatient   Does not apply q1800   Continuous Infusions: . sodium chloride Stopped (03/06/16 1419)   PRN Meds:.sodium chloride, ALPRAZolam, metoprolol, morphine injection, ondansetron (ZOFRAN) IV, oxyCODONE, sodium chloride flush, sodium chloride flush, sodium chloride flush, traMADol  Xrays Dg Chest Port 1 View  03/08/2016  CLINICAL DATA:  Status post mitral valve repair EXAM: PORTABLE CHEST 1 VIEW COMPARISON:  03/07/2016 FINDINGS: Cardiac shadow is mildly enlarged. Postsurgical changes are again seen. A left jugular sheath is again noted. The Swan-Ganz catheter has been removed. Two right thoracostomy catheters are again seen. No pneumothorax is noted. Minimal right basilar atelectasis is again  seen. IMPRESSION: No significant interval change from the prior exam. Electronically Signed   By: Alcide Clever M.D.   On: 03/08/2016 07:55    Assessment/Plan: S/P Procedure(s) (LRB): MINIMALLY INVASIVE MITRAL VALVE REPAIR (MVR) using a 34 Sorin Memo 3D Ring (Right) TRANSESOPHAGEAL ECHOCARDIOGRAM (TEE) (N/A) CLIPPING OF LEFT ATRIAL APPENDAGE using a 45 PRO2 AtriClip (Left)  1 HR sinus in 80's under pacer- disconnect 2 hemodyn stable 3 cont coumadin  4 some volume overload- cont lasix/K+ 5 H/H stable 6 creat normal 7 >400 out of CTube yesterday- monitor, poss d/c later today or tomorrow 8 push rehab/pulm toilet as able  LOS: 3 days    GOLD,WAYNE E 03/09/2016  On coumadin 2.5 mg day  Lab Results  Component  Value Date   INR 1.21 03/09/2016   INR 1.37 03/08/2016   INR 1.31 03/06/2016   Leave chest  Tubes today I have seen and examined [name] and agree with the above assessment  and plan.  Delight Ovens MD Beeper [phone] Office [phone] 03/09/2016 4:19 PM

## 2016-03-09 NOTE — Progress Notes (Addendum)
CARDIAC REHAB PHASE I   PRE:  Rate/Rhythm: Sr 83 with atrial paced beats at times  BP:  Supine:   Sitting: 113/88  Standing:    SaO2: 96 RA  MODE:  Ambulation: 300 ft   POST:  Rate/Rhythm: 96  BP:  Supine:   Sitting: 122/81  Standing:    SaO2: 95 RA  Pt slow to move and complains of feeling tired and sleepy.  Pt up to ambulate in hallway with RW x 1 assist.  Pt to BSC to void.  Peri care provided.  Pt back to bed per request. In anticipation of possible d/c Sunday, education completed.  Reviewed with pt and husband heart healthy diet, activity guidelines and restrictions with post surgery.  Pt agreed for contact information to [facility]. Pt demonstrated appropriately the IS and flutter valve.  Pt able to achieve 750 with result of productive cough.  Encourage pt to use it every 1-2 hours. Pt also advised to ambulate in hallway with nursing staff.  Handouts provided and questions answered.  Call bell within reach.  Alanson Alyarlette Carlton RN, BSN [phone]

## 2016-03-10 ENCOUNTER — Inpatient Hospital Stay (HOSPITAL_COMMUNITY): Payer: BC Managed Care – PPO

## 2016-03-10 LAB — PROTIME-INR
INR: 1.1 (ref 0.00–1.49)
Prothrombin Time: 14.4 seconds (ref 11.6–15.2)

## 2016-03-10 MED ORDER — AMIODARONE HCL 200 MG PO TABS
100.0000 mg | ORAL_TABLET | Freq: Every day | ORAL | Status: DC
  Administered 2016-03-10: 100 mg via ORAL
  Filled 2016-03-10: qty 0.5
  Filled 2016-03-10: qty 1

## 2016-03-10 MED ORDER — WARFARIN SODIUM 4 MG PO TABS
4.0000 mg | ORAL_TABLET | Freq: Every day | ORAL | Status: DC
  Administered 2016-03-10: 4 mg via ORAL
  Filled 2016-03-10: qty 1

## 2016-03-10 MED ORDER — FUROSEMIDE 40 MG PO TABS
40.0000 mg | ORAL_TABLET | Freq: Every day | ORAL | Status: DC
  Administered 2016-03-11 – 2016-03-12 (×2): 40 mg via ORAL
  Filled 2016-03-10 (×2): qty 1

## 2016-03-10 MED ORDER — PATIENT'S GUIDE TO USING COUMADIN BOOK
Freq: Once | Status: AC
  Administered 2016-03-10: 1
  Filled 2016-03-10: qty 1

## 2016-03-10 NOTE — Progress Notes (Signed)
Patient chest tube removed per MD order.  Patient tolerated well.  RN will continue to monitor patient

## 2016-03-10 NOTE — Progress Notes (Addendum)
[address]       [place] [id number]             [phone]      4 Days Post-Op Procedure(s) (LRB): MINIMALLY INVASIVE MITRAL VALVE REPAIR (MVR) using a 34 Sorin Memo 3D Ring (Right) TRANSESOPHAGEAL ECHOCARDIOGRAM (TEE) (N/A) CLIPPING OF LEFT ATRIAL APPENDAGE using a 45 PRO2 AtriClip (Left) Subjective: Feels a bit better. No specific c/o  Objective: Vital signs in last 24 hours: Temp:  [98.1 F (36.7 C)-98.3 F (36.8 C)] 98.1 F (36.7 C) (03/12 0514) Pulse Rate:  [85-86] 85 (03/12 0514) Cardiac Rhythm:  [-] Normal sinus rhythm;Heart block (03/12 0734) Resp:  [16-20] 18 (03/12 0514) BP: (110-131)/(80-86) 124/86 mmHg (03/12 0514) SpO2:  [94 %-95 %] 95 % (03/12 0514) Weight:  [149 lb 4.8 oz (67.722 kg)] 149 lb 4.8 oz (67.722 kg) (03/12 0514)  Hemodynamic parameters for last 24 hours:    Intake/Output from previous day: 03/11 0701 - 03/12 0700 In: 480 [P.O.:480] Out: 3191 [Urine:2950; Stool:1; Chest Tube:240] Intake/Output this shift:    General appearance: alert, cooperative and no distress Heart: regular rate and rhythm Lungs: dim in lower fields Abdomen: benign Extremities: no edema Wound: incis healing well  Lab Results:  Recent Labs  03/08/16 0740 03/09/16 0401  WBC 14.6* 12.2*  HGB 11.3* 11.1*  HCT 34.4* 33.4*  PLT 129* 135*   BMET:  Recent Labs  03/08/16 0435 03/09/16 0401  NA 139 135  K 4.1 3.6  CL 108 102  CO2 21* 25  GLUCOSE 106* 117*  BUN 12 12  CREATININE 0.75 0.55  CALCIUM 8.7* 8.6*    PT/INR:  Recent Labs  03/10/16 0528  LABPROT 14.4  INR 1.10   ABG    Component Value Date/Time   PHART 7.300* 03/07/2016 0031   HCO3 20.5 03/07/2016 0031   TCO2 20 03/07/2016 1623   ACIDBASEDEF 6.0* 03/07/2016 0031   O2SAT 97.0 03/07/2016 0031   CBG (last 3)   Recent Labs  03/08/16 0849 03/08/16 1217 03/08/16 1621  GLUCAP 100* 103* 109*    Meds Scheduled Meds: . acetaminophen  1,000 mg Oral 4 times per  day  . amiodarone  200 mg Oral Daily  . aspirin  325 mg Oral Daily  . bisacodyl  10 mg Oral Daily   Or  . bisacodyl  10 mg Rectal Daily  . docusate sodium  200 mg Oral Daily  . FLUoxetine  40 mg Oral Daily  . furosemide  40 mg Oral BID  . pantoprazole  40 mg Oral Daily  . patient's guide to using coumadin book   Does not apply Once  . potassium chloride  20 mEq Oral BID  . sodium chloride flush  10-40 mL Intracatheter Q12H  . sodium chloride flush  3 mL Intravenous Q12H  . sodium chloride flush  3 mL Intravenous Q12H  . warfarin  2.5 mg Oral q1800  . warfarin   Does not apply Once  . Warfarin - Physician Dosing Inpatient   Does not apply q1800   Continuous Infusions: . sodium chloride Stopped (03/06/16 1419)   PRN Meds:.sodium chloride, ALPRAZolam, metoprolol, morphine injection, ondansetron (ZOFRAN) IV, oxyCODONE, sodium chloride flush, sodium chloride flush, sodium chloride flush, traMADol  Xrays No results found.  Assessment/Plan: S/P Procedure(s) (LRB): MINIMALLY INVASIVE MITRAL VALVE REPAIR (MVR) using a 34 Sorin Memo 3D Ring (Right) TRANSESOPHAGEAL ECHOCARDIOGRAM (TEE) (N/A) CLIPPING OF LEFT ATRIAL APPENDAGE using a 45 PRO2 AtriClip (Left)  1 240 cc out of CT's yesterday- will d/c  2 push rehab/pulm toilet as able 3 increase coumadin dose 4 QTc is > 480, will decrease amio to 100 q day 5 decrease lasix to daily and replace K+ 6 H/H pretty stable  LOS: 4 days    GOLD,WAYNE E 03/10/2016  Lab Results  Component Value Date   INR 1.10 03/10/2016   INR 1.21 03/09/2016   INR 1.37 03/08/2016   increase daily dose coumadin to 4 mg Chest tubes out today I have seen and examined Becky Murphy and agree with the above assessment  and plan.  Delight Ovens MD Beeper [phone] Office [phone] 03/10/2016 12:14 PM   Stable

## 2016-03-11 LAB — BRAIN NATRIURETIC PEPTIDE: B NATRIURETIC PEPTIDE 5: 271.4 pg/mL — AB (ref 0.0–100.0)

## 2016-03-11 LAB — PROTIME-INR
INR: 1.12 (ref 0.00–1.49)
PROTHROMBIN TIME: 14.6 s (ref 11.6–15.2)

## 2016-03-11 MED ORDER — METOPROLOL TARTRATE 12.5 MG HALF TABLET
12.5000 mg | ORAL_TABLET | Freq: Two times a day (BID) | ORAL | Status: DC
  Administered 2016-03-11 – 2016-03-12 (×3): 12.5 mg via ORAL
  Filled 2016-03-11 (×3): qty 1

## 2016-03-11 MED ORDER — WARFARIN SODIUM 5 MG PO TABS
5.0000 mg | ORAL_TABLET | Freq: Every day | ORAL | Status: DC
  Administered 2016-03-11: 5 mg via ORAL
  Filled 2016-03-11: qty 1

## 2016-03-11 MED ORDER — METOPROLOL TARTRATE 25 MG PO TABS: 25.0000 mg | ORAL_TABLET | Freq: Two times a day (BID) | ORAL | Status: DC

## 2016-03-11 MED ORDER — WARFARIN SODIUM 4 MG PO TABS: 4.0000 mg | ORAL_TABLET | Freq: Every day | ORAL | Status: DC

## 2016-03-11 MED ORDER — ASPIRIN EC 81 MG PO TBEC
81.0000 mg | DELAYED_RELEASE_TABLET | Freq: Every day | ORAL | Status: DC
  Administered 2016-03-11 – 2016-03-12 (×2): 81 mg via ORAL
  Filled 2016-03-11 (×2): qty 1

## 2016-03-11 NOTE — Progress Notes (Signed)
EPW d/c'd per order and per protocol. EPW tips intact. Vitals stable. Pt tolerated well. Pt and family educated on need for bedrest 1h and q5623m vitals. Call bell and phone within reach. Will continue to monitor.

## 2016-03-11 NOTE — Discharge Summary (Addendum)
Physician Discharge Summary  Patient ID: Becky Murphy MRN: [id number] DOB/AGE: [birth year] 64 y.o.  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 S/P minimally invasive mitral valve repair 03/06/2016  . Pancreatic mass 01/29/2016  . Thyroid nodule 01/29/2016  . Exertional dyspnea 01/05/2016  . 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 Mitral insufficiency   . Mitral valve prolapse 12/26/2015  . Chest pain 11/02/2015  . Anxiety 11/02/2015    HPI: At time of consultation  Patient is a 65 year old female with recently discovered mitral valve prolapse who has been referred for surgical consultation to discuss treatment options for management of severe symptomatic mitral regurgitation. Patient states that she has been relatively healthy and physically active for the majority of her adult life. Last spring she first began to experience symptoms of exertional shortness of breath, fatigue, and atypical chest discomfort. She began to experience intermittent dizzy spells. She presented to the hospital acutely in early November with atypical chest discomfort, shortness of breath, and dizziness. EKG revealed normal sinus rhythm without ischemic changes and the patient ruled out for acute myocardial infarction based on serial cardiac enzymes. She was noted to have a systolic murmur on physical exam and transthoracic echocardiogram revealed mitral valve prolapse with mild to moderate mitral regurgitation. The patient was seen in follow-up by Dr. Royann Shivers who noted that the patient had a fairly prominent murmur on physical exam and symptoms suggestive of  symptomatic mitral valve disease. Transesophageal echocardiogram was recommended. TEE performed 01/05/2016 confirmed the presence of holosystolic prolapse of a large segment of the posterior leaflet of the mitral valve with severe mitral regurgitation, with an eccentric jet coursing anteriorly around the left atrium. Left ventricular size and systolic function remains normal. The patient subsequently underwent left and right heart catheterization on 01/16/2016 revealed normal coronary artery anatomy with no significant coronary artery disease. Pulmonary artery pressures were normal. The patient was referred for surgical consultation.  She was originally seen in consultation on 01/22/2016 and plans were made for minimally invasive mitral valve repair. However, preoperative CT angiogram of the chest, abdomen, and pelvis revealed 2 cystic lesions in the pancreas. Follow-up MRI of the abdomen confirmed the presence of 2 separate hypodense masses in the pancreas. She was referred for GI consultation and underwent endoscopic ultrasound with needle aspiration biopsy by Dr. Dulce Sellar on 02/20/2016. Cytology from the aspirated fluid did not reveal any malignant cells, and the patient has been cleared to proceed with elective mitral valve repair. She returns to the office today with hopes to proceed with surgery later this week. She states that she has been having some abdominal discomfort ever since her biopsy was performed on 02/20/2016. She has still been eating and her bowel function is regular. She has not had fevers or chills. The pain in her abdomen is described as constant but has dissipated somewhat over the last 2 or 3 days. She was started on oral ciprofloxacin by Dr. Dulce Sellar the end of last week. She states that she otherwise feels well. She still has frequent palpitations. She does not have shortness of breath at rest but she will get short of breath with moderate level exertion. The  remainder of her review of  systems is unchanged from previously.  The patient is married and lives with her husband locally in [place]. 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 She has limited physically active life and enjoys camping and traveling to their vacation properties at the [place] and in the mountains. She has mild arthritis but otherwise reports no significant physical limitations. She describes progressive symptoms of exertional shortness of breath over the last 6-9 months consistent with chronic diastolic congestive heart failure, New York Heart Association functional class IIB-III. She states that she occasionally gets short of breath at night when she tries to sleep and she cannot lay flat in bed. She states that her breathing is improved when she sits upright. She has had frequent palpitations and dizzy spells without syncope. She has not had lower extremity edema. She describes atypical pressure-like tightness across her upper chest that is worse when she is feeling short of breath. She now gets short of breath with relatively mild activity. Her energy level has decreased considerably. She has not had fevers or chills. She reports recent development of a dry nonproductive cough.  She was admitted electively for the procedure.              Discharged Condition: good  Hospital Course: The patient was admitted electively and on 03/06/2016 she was taken to the operating room where she underwent the below described procedure. She tolerated the procedure well and was taken to the surgical intensive care unit in stable condition. Postoperatively she has progressed nicely. She has maintained stable hemodynamics initially requiring AAI pacing however this was weaned without difficulty. She was initially placed on amiodarone  however the amiodarone was subsequently discontinued due to significantly prolonged QTc interval. She is on Coumadin for the valve repair and dosing is being adjusted based  on daily INR. She is currently on Coumadin 5 mg and will be discharged on this dose. Her INR today is 1.21. All routine lines, monitors and drainage devices have been discontinued in the standard fashion. She does have a postoperative volume overload but is responding well to diuretics. She has an expected acute blood loss anemia which has stabilized. She had a postoperative thrombocytopenia which has been mild and has also stabilized. Most recent platelet count is 135,000. She developed a mild postoperative leukocytosis which also has shown a steady improvement over time. Most recent white blood cell count is 12.2. Incisions are noted be healing well without evidence of infection. She is tolerating diet. She is tolerating gradually increasing activities using standard cardiac rehabilitation protocols. Per Dr. Cornelius Moras, she is felt surgically stable for discharge today.  Consults: None  Significant Diagnostic Studies: Routine postoperative serial chest x-ray and laboratory.  Treatments: surgery:  CARDIOTHORACIC SURGERY OPERATIVE NOTE  Date of Procedure:03/06/2016  Preoperative Diagnosis:Severe Mitral Regurgitation  Postoperative Diagnosis:Same  Procedure:   Minimally-Invasive Mitral Valve Repair Complex valvuloplasty including triangular resection of posterior leaflet Artificial Gore-tex neochord placement x6 Sorin Memo 3D Ring Annuloplasty (size 34mm, catalog # [id number], serial # [id number]) Clipping of LA Appendage   Surgeon:Clarence H. 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 rate 18, height 5\' 6"  (1.676 m), weight 145 lb 8.1 oz (66 kg), SpO2 93 %.  Cardiovascular: RRR Pulmonary: Clear to auscultation bilaterally; no rales, wheezes, or rhonchi. Abdomen: Soft, non tender, bowel sounds present. Extremities: No lower extremity edema. Wounds: Clean and dry. No erythema or signs of infection. Slight drainage from posterior chest tube  Disposition: 01-Home or Self Care      Discharge Instructions    Amb Referral to Cardiac Rehabilitation    Complete by:  As directed   Diagnosis:  Valve Replacement/Repair  Valve:  Mitral            Medication List    STOP taking these medications        amiodarone 200 MG tablet  Commonly known as:  PACERONE     aspirin 325 MG tablet  Replaced by:  aspirin 81 MG EC tablet     ciprofloxacin 500 MG tablet  Commonly known as:  CIPRO     ibuprofen 200 MG tablet  Commonly known as:  ADVIL,MOTRIN      TAKE these medications        ALPRAZolam 0.5 MG tablet  Commonly known as:  XANAX  Take 0.5 mg by mouth 3 (three) times daily as needed for anxiety.     aspirin 81 MG EC tablet  Take 1 tablet (81 mg total) by mouth daily.     FLUoxetine 40 MG capsule  Commonly known as:  PROZAC  Take 1 capsule (40 mg total) by mouth daily.     furosemide 40 MG tablet  Commonly known as:  LASIX  Take 1 tablet (40 mg total) by mouth daily. For 5 days then stop     metoprolol tartrate 25 MG tablet  Commonly known as:  LOPRESSOR  Take 0.5 tablets (12.5 mg total) by mouth 2 (two) times daily.     potassium chloride SA 20 MEQ tablet  Commonly known as:  K-DUR,KLOR-CON  Take 1 tablet (20 mEq total) by mouth daily. For 5 days then stop.     traMADol 50 MG tablet  Commonly known as:  ULTRAM  Take 1-2  tablets (50-100 mg total) by mouth every 4 (four) hours as needed for moderate pain.     warfarin 5 MG tablet  Commonly known as:  COUMADIN  Take 1 tablet (5 mg total) by mouth daily at 6 PM. Or as directed        The patient has been discharged on:   1.Beta Blocker:  Yes [  x ]                              No   [   ]                              If No, reason:  2.Ace Inhibitor/ARB: Yes [   ]                                     No  [  x  ]  If No, reason:Labile BP  3.Statin:   Yes [   ]                  No  [ x  ]                  If No, reason:No CAD, hyperlipidemia  4.Marlowe KaysEcasaValentino Hue:  Yes  [ x  ]                  No   [   ]                  If No, reason:  Follow-up Information    Follow up with Thurmon FairROITORU,MIHAI, MD.   Specialty:  Cardiology   Why:  Call for an appointment to have PT and INR (as is on Coumadin, INR 2-2.5) drawn on Thursday 03/14/2016   Contact information:   [address] [place] [address] [phone]       Follow up with Theodore DemarkBarrett, Rhonda, PA-C On 03/26/2016.   Specialties:  Cardiology, Radiology   Why:  Appointment time is at 9:00 am   Contact information:   [address] [place] [address] [phone]       Follow up with Purcell Nailslarence H Owen, MD.   Specialty:  Cardiothoracic Surgery   Why:  PA/LAt CXR to be taken ([facility] which is in the same building as Dr. Orvan Julywen's office) on 03/26/2016 at 10:15 am;Appointment time is at 11:00 am   Contact information:   [address] [place] [address] [phone]       Follow up with Nurse.   Why:  Appointment time is at 10:00 am and is with nurse for chest tube suture removal only   Contact information:   [address] [place] [address] [phone]      Signed: Ardelle BallsZIMMERMAN,DONIELLE M PA-C 03/12/2016, 12:07 PM

## 2016-03-11 NOTE — Progress Notes (Signed)
0925 Pt just walked over 500 ft with husband. Walked 350 ft yesterday. Will continue to follow pt. Walked three times yesterday. Luetta NuttingCharlene Iniya Matzek RN BSN 03/11/2016 9:26 AM

## 2016-03-11 NOTE — Progress Notes (Signed)
CARDIAC REHAB PHASE I   Third attempt to ambulate with pt today, pt up in hallway, ambulating independently with family. Will re-attempt tomorrow.   Joylene GrapesEmily C Estle Sabella, RN, BSN 03/11/2016 1:21 PM

## 2016-03-11 NOTE — Progress Notes (Addendum)
      [address]       [place] [id number]             [phone]        5 Days Post-Op Procedure(s) (LRB): MINIMALLY INVASIVE MITRAL VALVE REPAIR (MVR) using a 34 Sorin Memo 3D Ring (Right) TRANSESOPHAGEAL ECHOCARDIOGRAM (TEE) (N/A) CLIPPING OF LEFT ATRIAL APPENDAGE using a 45 PRO2 AtriClip (Left)  Subjective: Patient with drainage from posterior chest tube site  Objective: Vital signs in last 24 hours: Temp:  [98.1 F (36.7 C)-98.2 F (36.8 C)] 98.1 F (36.7 C) (03/13 0400) Pulse Rate:  [85-96] 88 (03/13 0400) Cardiac Rhythm:  [-] Normal sinus rhythm;Heart block (03/12 1900) Resp:  [18-20] 18 (03/13 0400) BP: (107-133)/(71-94) 115/75 mmHg (03/13 0400) SpO2:  [97 %] 97 % (03/13 0400)  Pre op weight 65 kg Current Weight  03/10/16 149 lb 4.8 oz (67.722 kg)      Intake/Output from previous day: 03/12 0701 - 03/13 0700 In: 720 [P.O.:720] Out: 700 [Urine:700]   Physical Exam:  Cardiovascular: RRR Pulmonary: Clear to auscultation bilaterally; no rales, wheezes, or rhonchi. Abdomen: Soft, non tender, bowel sounds present. Extremities: No lower extremity edema. Wounds: Clean and dry.  No erythema or signs of infection. Clear drainage from posterior chest tube  Lab Results: CBC: Recent Labs  03/08/16 0740 03/09/16 0401  WBC 14.6* 12.2*  HGB 11.3* 11.1*  HCT 34.4* 33.4*  PLT 129* 135*   BMET:  Recent Labs  03/09/16 0401  NA 135  K 3.6  CL 102  CO2 25  GLUCOSE 117*  BUN 12  CREATININE 0.55  CALCIUM 8.6*    PT/INR:  Lab Results  Component Value Date   INR 1.12 03/11/2016   INR 1.10 03/10/2016   INR 1.21 03/09/2016   ABG:  INR: Will add last result for INR, ABG once components are confirmed Will add last 4 CBG results once components are confirmed  Assessment/Plan:  1. CV - SR in the 90's. On Amiodarone 100 mg daily (was decreased over weekend secondary to prolonged QTc). Also, on Coumadin. INR 1.12. Was given 4 mg last  night so will continue tonight. QTc now over 620. Will stop Amiodarone. Also, decrease ecasa to 81 mg as is on Coumadin. Disconnected external pacer. Likely pull wires. May be able to consider low dose BB. 2.  Pulmonary - On room air. Encourage incentive spirometer 3. Volume Overload - On Lasix 40 mg daily 4.  Acute blood loss anemia - Last H and H 11.1 and 33.4 5. Thrombocytopenia-platelets 135,000 6. Remove On Q 7. Will be ready for discharge once able to determine Coumadin dose  ZIMMERMAN,DONIELLE MPA-C 03/11/2016,7:30 AM  I have seen and examined the patient and agree with the assessment and plan as outlined.  D/C pacing wires.  D/C amiodarone.  Start metoprolol.  Mobilize.  Anticipate possible d/c home tomorrow.  Purcell Nailslarence H Carollee Nussbaumer, MD 03/11/2016 8:50 AM

## 2016-03-11 NOTE — Progress Notes (Signed)
On-Q pump d/c'd. Pt disconnected from external pacer. EPW taped. CT suture site re-dressed. PA present at the time. Pt has no complaints of pain, and expresses no needs at this time. Will continue to monitor.  Reynold Boweneresa Clotine Heiner, RN

## 2016-03-12 LAB — BASIC METABOLIC PANEL
ANION GAP: 11 (ref 5–15)
BUN: 9 mg/dL (ref 6–20)
CALCIUM: 9.1 mg/dL (ref 8.9–10.3)
CO2: 29 mmol/L (ref 22–32)
Chloride: 100 mmol/L — ABNORMAL LOW (ref 101–111)
Creatinine, Ser: 0.58 mg/dL (ref 0.44–1.00)
GFR calc Af Amer: >60 mL/min (ref >60)
GFR calc non Af Amer: >60 mL/min (ref >60)
GLUCOSE: 102 mg/dL — AB (ref 65–99)
POTASSIUM: 3.5 mmol/L (ref 3.5–5.1)
Sodium: 140 mmol/L (ref 135–145)

## 2016-03-12 LAB — PROTIME-INR
INR: 1.21 (ref 0.00–1.49)
PROTHROMBIN TIME: 15.4 s — AB (ref 11.6–15.2)

## 2016-03-12 MED ORDER — WARFARIN SODIUM 5 MG PO TABS: 5.0000 mg | ORAL_TABLET | Freq: Every day | ORAL | Status: DC

## 2016-03-12 MED ORDER — FLUOXETINE HCL 40 MG PO CAPS: 40.0000 mg | ORAL_CAPSULE | Freq: Every day | ORAL | Status: DC

## 2016-03-12 MED ORDER — ASPIRIN 81 MG PO TBEC: 81.0000 mg | DELAYED_RELEASE_TABLET | Freq: Every day | ORAL | Status: DC

## 2016-03-12 MED ORDER — POTASSIUM CHLORIDE CRYS ER 20 MEQ PO TBCR
40.0000 meq | EXTENDED_RELEASE_TABLET | Freq: Once | ORAL | Status: AC
Start: 2016-03-12 — End: 2016-03-12
  Administered 2016-03-12: 40 meq via ORAL
  Filled 2016-03-12: qty 2

## 2016-03-12 MED ORDER — METOPROLOL TARTRATE 25 MG PO TABS: 12.5000 mg | ORAL_TABLET | Freq: Two times a day (BID) | ORAL | Status: DC

## 2016-03-12 MED ORDER — FUROSEMIDE 40 MG PO TABS
40.0000 mg | ORAL_TABLET | Freq: Every day | ORAL | Status: DC
Start: 2016-03-12 — End: 2016-03-19

## 2016-03-12 MED ORDER — TRAMADOL HCL 50 MG PO TABS: 50.0000 mg | ORAL_TABLET | ORAL | Status: DC | PRN

## 2016-03-12 MED ORDER — OXYCODONE HCL 5 MG PO TABS: 5.0000 mg | ORAL_TABLET | ORAL | Status: DC | PRN

## 2016-03-12 MED ORDER — POTASSIUM CHLORIDE CRYS ER 20 MEQ PO TBCR: 20.0000 meq | EXTENDED_RELEASE_TABLET | Freq: Every day | ORAL | Status: DC

## 2016-03-12 NOTE — Progress Notes (Addendum)
      [address]       [place] [id number]             [phone]        6 Days Post-Op Procedure(s) (LRB): MINIMALLY INVASIVE MITRAL VALVE REPAIR (MVR) using a 34 Sorin Memo 3D Ring (Right) TRANSESOPHAGEAL ECHOCARDIOGRAM (TEE) (N/A) CLIPPING OF LEFT ATRIAL APPENDAGE using a 45 PRO2 AtriClip (Left)  Subjective: Patient with less drainage from posterior chest tube site  Objective: Vital signs in last 24 hours: Temp:  [98.3 F (36.8 C)-98.6 F (37 C)] 98.6 F (37 C) (03/14 0431) Pulse Rate:  [78-95] 83 (03/14 0431) Cardiac Rhythm:  [-] Normal sinus rhythm;Heart block (03/13 1926) Resp:  [18-19] 18 (03/14 0431) BP: (82-118)/(59-95) 118/86 mmHg (03/14 0431) SpO2:  [93 %-95 %] 93 % (03/14 0431) Weight:  [145 lb 8.1 oz (66 kg)] 145 lb 8.1 oz (66 kg) (03/14 0431)  Pre op weight 65 kg Current Weight  03/12/16 145 lb 8.1 oz (66 kg)      Intake/Output from previous day: 03/13 0701 - 03/14 0700 In: 720 [P.O.:720] Out: 1200 [Urine:1200]   Physical Exam:  Cardiovascular: RRR Pulmonary: Clear to auscultation bilaterally; no rales, wheezes, or rhonchi. Abdomen: Soft, non tender, bowel sounds present. Extremities: No lower extremity edema. Wounds: Clean and dry.  No erythema or signs of infection. Slight drainage from posterior chest tube  Lab Results: CBC:No results for input(s): WBC, HGB, HCT, PLT in the last 72 hours. BMET:   Recent Labs  03/12/16 0319  NA 140  K 3.5  CL 100*  CO2 29  GLUCOSE 102*  BUN 9  CREATININE 0.58  CALCIUM 9.1    PT/INR:  Lab Results  Component Value Date   INR 1.21 03/12/2016   INR 1.12 03/11/2016   INR 1.10 03/10/2016   ABG:  INR: Will add last result for INR, ABG once components are confirmed Will add last 4 CBG results once components are confirmed  Assessment/Plan:  1. CV - SR/first degree heart block in the 90's to low 100's. On Lopressor 12.5 mg bid. Also, on Coumadin. INR slightly increased to  1.21. Was given 5 mg last night so will continue tonight.  2.  Pulmonary - On room air. Encourage incentive spirometer 3. Volume Overload - On Lasix 40 mg daily 4.  Acute blood loss anemia - Last H and H 11.1 and 33.4 5. Thrombocytopenia-platelets 135,000 6. Supplement potassium 7. Will likely discharge later today  ZIMMERMAN,DONIELLE MPA-C 03/12/2016,7:32 AM   I have seen and examined the patient and agree with the assessment and plan as outlined.  D/C home today on coumadin 5 mg/day and low dose ASA.  Continue metoprolol 12.5 bid.  Purcell Nailslarence H Owen, MD 03/12/2016 11:27 AM

## 2016-03-12 NOTE — Care Management Note (Signed)
Case Management Note  Patient Details  Name: Becky Murphy MRN: [id number] Date of Birth: 1951/04/19  Subjective/Objective:          S/p Mini MVR          Action/Plan:  PTA -  Independent per husband.  Pt lives with husband and he states he will provide 24 hour supervision for pt at discharge   Expected Discharge Date:      03/12/16            Expected Discharge Plan:  Home/Self Care  In-House Referral:     Discharge planning Services  CM Consult  Post Acute Care Choice:    Choice offered to:     DME Arranged:    DME Agency:     HH Arranged:    HH Agency:     Status of Service:  Completed, signed off  Medicare Important Message Given:    Date Medicare IM Given:    Medicare IM give by:    Date Additional Medicare IM Given:    Additional Medicare Important Message give by:     If discussed at Long Length of Stay Meetings, dates discussed:  03/12/16  Additional Comments:  03/12/16- Donn PieriniKristi Jericho Cieslik RN, BSN- pt for d/c home today- no d/c needs noted.  Darrold SpanWebster, Meleana Commerford Hall, RN 03/12/2016, 12:41 PM

## 2016-03-14 ENCOUNTER — Ambulatory Visit (INDEPENDENT_AMBULATORY_CARE_PROVIDER_SITE_OTHER): Payer: BC Managed Care – PPO | Admitting: Pharmacist Clinician (PhC)/ Clinical Pharmacy Specialist

## 2016-03-14 DIAGNOSIS — Z7901 Long term (current) use of anticoagulants: Secondary | ICD-10-CM

## 2016-03-14 DIAGNOSIS — Z9889 Other specified postprocedural states: Secondary | ICD-10-CM | POA: Diagnosis not present

## 2016-03-14 LAB — POCT INR: INR: 1.3

## 2016-03-19 ENCOUNTER — Other Ambulatory Visit: Payer: Self-pay | Admitting: *Deleted

## 2016-03-19 ENCOUNTER — Encounter (INDEPENDENT_AMBULATORY_CARE_PROVIDER_SITE_OTHER): Payer: Self-pay | Admitting: *Deleted

## 2016-03-19 ENCOUNTER — Ambulatory Visit (INDEPENDENT_AMBULATORY_CARE_PROVIDER_SITE_OTHER): Payer: BC Managed Care – PPO | Admitting: Pharmacist Clinician (PhC)/ Clinical Pharmacy Specialist

## 2016-03-19 DIAGNOSIS — I340 Nonrheumatic mitral (valve) insufficiency: Secondary | ICD-10-CM

## 2016-03-19 DIAGNOSIS — Z9889 Other specified postprocedural states: Secondary | ICD-10-CM

## 2016-03-19 DIAGNOSIS — Z7901 Long term (current) use of anticoagulants: Secondary | ICD-10-CM

## 2016-03-19 DIAGNOSIS — G8918 Other acute postprocedural pain: Secondary | ICD-10-CM

## 2016-03-19 DIAGNOSIS — I341 Nonrheumatic mitral (valve) prolapse: Secondary | ICD-10-CM

## 2016-03-19 DIAGNOSIS — Z4802 Encounter for removal of sutures: Secondary | ICD-10-CM

## 2016-03-19 LAB — POCT INR: INR: 1.1

## 2016-03-19 MED ORDER — TRAMADOL HCL 50 MG PO TABS
50.0000 mg | ORAL_TABLET | ORAL | Status: DC | PRN
Start: 2016-03-19 — End: 2016-04-03

## 2016-03-19 NOTE — Progress Notes (Signed)
Becky Murphy returns for suture removal of two previous pleural sites s/p MINI MVR.  These were easily removed. These sites as well as the mini anterior thoracotomy incision are all very well healed. Appetite and bowels are normal. Pain management was discussed as she was in pain at the time of her appointment today. She says she only takes pain med at bedtime. I encouraged her to take it during the day when she needed it also for a better recovery  I will fax a new script to her pharmacy and she agreed. She will return next week with a cxr.

## 2016-03-22 ENCOUNTER — Ambulatory Visit (INDEPENDENT_AMBULATORY_CARE_PROVIDER_SITE_OTHER): Payer: BC Managed Care – PPO | Admitting: Pharmacist Clinician (PhC)/ Clinical Pharmacy Specialist

## 2016-03-22 ENCOUNTER — Other Ambulatory Visit: Payer: Self-pay | Admitting: Thoracic Surgery (Cardiothoracic Vascular Surgery)

## 2016-03-22 DIAGNOSIS — Z9889 Other specified postprocedural states: Secondary | ICD-10-CM

## 2016-03-22 DIAGNOSIS — Z7901 Long term (current) use of anticoagulants: Secondary | ICD-10-CM | POA: Diagnosis not present

## 2016-03-22 LAB — POCT INR: INR: 1.4

## 2016-03-25 ENCOUNTER — Ambulatory Visit (INDEPENDENT_AMBULATORY_CARE_PROVIDER_SITE_OTHER): Payer: Self-pay | Admitting: Thoracic Surgery (Cardiothoracic Vascular Surgery)

## 2016-03-25 ENCOUNTER — Encounter: Payer: Self-pay | Admitting: Thoracic Surgery (Cardiothoracic Vascular Surgery)

## 2016-03-25 ENCOUNTER — Ambulatory Visit
Admission: RE | Admit: 2016-03-25 | Discharge: 2016-03-25 | Disposition: A | Discharge status: Home / Self Care | Payer: BC Managed Care – PPO | Source: Ambulatory Visit | Attending: Thoracic Surgery (Cardiothoracic Vascular Surgery) | Admitting: Thoracic Surgery (Cardiothoracic Vascular Surgery)

## 2016-03-25 VITALS — BP 98/71 | HR 79 | Resp 20 | Ht 66.0 in | Wt 143.0 lb

## 2016-03-25 DIAGNOSIS — I340 Nonrheumatic mitral (valve) insufficiency: Secondary | ICD-10-CM

## 2016-03-25 DIAGNOSIS — Z9889 Other specified postprocedural states: Secondary | ICD-10-CM

## 2016-03-25 NOTE — Progress Notes (Signed)
[address]       Becky Murphy [address]             [phone]     CARDIOTHORACIC SURGERY OFFICE NOTE  Referring Provider is Croitoru, Rachelle Hora, MD PCP is Kaleen Mask, MD   HPI:  Patient returns to the office today for routine follow-up status post minimally invasive mitral valve repair on 03/06/2016 for mitral valve prolapse with stage D severe symptomatic primary mitral regurgitation.  Her postoperative recovery was uneventful and she was discharged home on the sixth postoperative day.  Her INR was checked 3 days ago at the Coumadin clinic and remained sub-therapeutic. Her Coumadin dose was increased and her INR will be rechecked later this week. She returns to our office for routine follow-up today. She states that she continues to make steady progress. She has mild residual soreness in her chest. She is still using tramadol at night to help her sleep and occasionally during the day. Her breathing is fine and she is walking twice a day, for half a mile at a time. She states that she is still walking fairly slowly but overall her exercise tolerance has steadily improved.  Her appetite is good. Overall she is pleased with her progress.   Current Outpatient Prescriptions  Medication Sig Dispense Refill  . ALPRAZolam (XANAX) 0.5 MG tablet Take 0.5 mg by mouth 3 (three) times daily as needed for anxiety.     Marland Kitchen aspirin EC 81 MG EC tablet Take 1 tablet (81 mg total) by mouth daily.    Marland Kitchen FLUoxetine (PROZAC) 40 MG capsule Take 1 capsule (40 mg total) by mouth daily. 7 capsule 0  . metoprolol tartrate (LOPRESSOR) 25 MG tablet Take 0.5 tablets (12.5 mg total) by mouth 2 (two) times daily. 30 tablet 1  . traMADol (ULTRAM) 50 MG tablet Take 1-2 tablets (50-100 mg total) by mouth every 4 (four) hours as needed for moderate pain. 40 tablet 0  . warfarin (COUMADIN) 5 MG tablet Take 1 tablet (5 mg total) by mouth daily at 6 PM. Or as directed 30 tablet 1   No current  facility-administered medications for this visit.      Physical Exam:   BP 98/71 mmHg  Pulse 79  Resp 20  Ht  (1.676 m)  Wt 143 lb (64.864 kg)  BMI 23.09 kg/m2  SpO2 96%  General:  Well-appearing  Chest:   Clear to auscultation  CV:   Regular rate and rhythm without murmur  Incisions:  Clean and dry healing nicely  Abdomen:  Soft nontender  Extremities:  Warm and well-perfused  Diagnostic Tests:  CHEST 2 VIEW  COMPARISON: Portable chest x-ray of March 10, 2016  FINDINGS: The lungs are better inflated today. There is a small right pleural effusion and trace left pleural effusion. There is persistent atelectasis or pneumonia in the right middle lobe. The heart is normal in size. The pulmonary vascularity is not engorged. The left atrial appendage clip appears to be in stable position. The prosthetic mitral valve ring is stable. The mediastinum is normal in width. The bony thorax exhibits no acute abnormality.  IMPRESSION: Persistent right middle lobe atelectasis or pneumonia. No evidence of CHF. Small bilateral pleural effusions greater on the right than on the left.   Electronically Signed  By: David Swaziland M.D.  On: 03/25/2016 10:31   Impression:  Patient is doing well less than 3 weeks status post minimally invasive mitral valve repair.  Plan:  I have encouraged the patient to continue to gradually increase her physical activity as tolerated.  She has been reminded to avoid any sort of heavy lifting or strenuous use of her arms or shoulders.  I think she would benefit from participation in the outpatient cardiac rehabilitation program. We have not changed any of her medications today. All of her questions have been addressed. The patient will return in 4 weeks with repeat chest x-ray.    Salvatore Decentlarence H. Cornelius Moraswen, MD 03/25/2016 4:00 PM

## 2016-03-25 NOTE — Patient Instructions (Signed)
You may continue to gradually increase your physical activity as tolerated.  Refrain from any heavy lifting or strenuous use of your arms and shoulders until at least 8 weeks from the time of your surgery, and avoid activities that cause increased pain in your chest on the side of your surgical incision.  Otherwise you may continue to increase activities without any particular limitations.  Increase the intensity and duration of physical activity gradually.  Continue all previous medications without any changes at this time  You are encouraged to enroll and participate in the outpatient cardiac rehab program beginning as soon as practical.   

## 2016-03-26 ENCOUNTER — Ambulatory Visit (INDEPENDENT_AMBULATORY_CARE_PROVIDER_SITE_OTHER): Payer: BC Managed Care – PPO | Admitting: Pharmacist Clinician (PhC)/ Clinical Pharmacy Specialist

## 2016-03-26 ENCOUNTER — Encounter: Payer: Self-pay | Admitting: Physician Assistant

## 2016-03-26 ENCOUNTER — Ambulatory Visit (INDEPENDENT_AMBULATORY_CARE_PROVIDER_SITE_OTHER): Payer: BC Managed Care – PPO | Admitting: Physician Assistant

## 2016-03-26 ENCOUNTER — Encounter: Payer: Self-pay | Admitting: *Deleted

## 2016-03-26 VITALS — BP 96/70 | HR 74 | Ht 66.0 in | Wt 142.8 lb

## 2016-03-26 DIAGNOSIS — Z5181 Encounter for therapeutic drug level monitoring: Secondary | ICD-10-CM | POA: Diagnosis not present

## 2016-03-26 DIAGNOSIS — Z7901 Long term (current) use of anticoagulants: Secondary | ICD-10-CM | POA: Diagnosis not present

## 2016-03-26 DIAGNOSIS — Z9889 Other specified postprocedural states: Secondary | ICD-10-CM

## 2016-03-26 LAB — POCT INR: INR: 1.7

## 2016-03-26 NOTE — Progress Notes (Signed)
Patient ID: Becky Murphy, female   DOB: 09-07-1951, 65 y.o.   MRN: [id number] A referral was faxed to [facility] to contact [name] to begin Phase II per the request of Dr. Tressie Stalkerlarence Owen.

## 2016-03-26 NOTE — Patient Instructions (Signed)
Medication Instructions:  Your physician recommends that you continue on your current medications as directed. Please refer to the Current Medication list given to you today.   Labwork: None ordered  Testing/Procedures: None ordered  Follow-Up: Your physician recommends that you schedule a follow-up appointment in: 3 months with Dr.Croitoru    Any Other Special Instructions Will Be Listed Below (If Applicable). Please liberalize your fluid intake (8) 8oz of fluid daily     If you need a refill on your cardiac medications before your next appointment, please call your pharmacy.

## 2016-03-26 NOTE — Progress Notes (Signed)
Cardiology Office Note   Date:  03/26/2016   ID:  MADALEE Murphy, DOB [birth year], MRN [id number]  PCP:  Kaleen Mask, MD  Cardiologist:  Dr Chrisandra Netters, PA-C   Chief Complaint  Patient presents with  . Follow-up    History of Present Illness: Becky SHIFRIN is a 65 y.o. female with a history of MVP, s/p MV annuloplasty ring and clipping LAA, d/c 03/14.  Becky Murphy presents for post-hospital followup.  Since d/c from the hospital, she has done very well. She is walking 1/2 mile twice a day. She is compliant with her medications. She occasionally Gets lightheaded when she first stands up, but it passes very quickly. It happened after car rides, and she states she may have been a little carsick. She has only had a couple of episodes. She has not had chest pain. She feels that in general she is doing very well. She has not had lower extremity edema, orthopnea or PND.  She is eating well, drinking less coffee then prior to the surgery and drinking more water. Her weight has not changed. Her blood pressure has never been high, but is Murphy a little bit lower than prior to the surgery. She is not having any palpitations.   Past Medical History  Diagnosis Date  . Anxiety   . Depression   . History of peptic ulcer "late 1970's"  . Arthritis     "back?" (11/03/2015)  . Migraine     "maybe 3-4/yr now" (11/03/2015)  . Mitral valve prolapse 12/26/2015    symptomatic"MVP surgery postponed to get lesion of pancreas evaluated first".  . 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 Chest pain 11/02/2015  . Pancreatic mass 01/29/2016    2.8 cm enhancing mass in the pancreatic neck and 3.1 cm low-attenuation cystic lesion in body of pancreas noted on CT angiogram  . Thyroid nodule 01/29/2016    2.4 cm enhancing mixed cystic and solid nodule inferior left thyroid gland noted on CT angiogram  . PONV (postoperative nausea and vomiting)   . Heart murmur   .  Exertional dyspnea 01/05/2016  . S/P minimally invasive mitral valve repair 03/06/2016    Complex valvuloplasty including triangular resection of posterior leaflet, artificial Gore-tex neochord placement x6 and 34 mm Memo 3D ring annuloplasty via right mini thoracotomy approach with clipping of LA appendage    Past Surgical History  Procedure Laterality Date  . Back surgery    . Lumbar disc surgery  1992    "trimmed bulges off both sides"  . Tubal ligation    . Tee without cardioversion N/A 01/05/2016    Procedure: TRANSESOPHAGEAL ECHOCARDIOGRAM (TEE);  Surgeon: Thurmon Fair, MD;  Location: [facility] ENDOSCOPY;  Service: Cardiovascular;  Laterality: N/A;  . Cardiac catheterization N/A 01/16/2016    Procedure: Right/Left Heart Cath and Coronary Angiography;  Surgeon: Thurmon Fair, MD;  Location: [facility] INVASIVE CV LAB;  Service: Cardiovascular;  Laterality: N/A;  . Eus N/A 02/20/2016    Procedure: ESOPHAGEAL ENDOSCOPIC ULTRASOUND (EUS) RADIAL;  Surgeon: Willis Modena, MD;  Location: [facility] ENDOSCOPY;  Service: Endoscopy;  Laterality: N/A;  . Breast biopsy Left ~ 2005  . Breast lumpectomy Left ~ 2005  . Eye muscle surgery Bilateral 1970's?  . Colonoscopy    . Mitral valve repair Right 03/06/2016    Procedure: MINIMALLY INVASIVE MITRAL VALVE REPAIR (MVR) using a 34 Sorin Memo 3D Ring;  Surgeon: Purcell Nails, MD;  Location: [facility] OR;  Service: Open Heart Surgery;  Laterality: Right;  . Tee without cardioversion N/A 03/06/2016    Procedure: TRANSESOPHAGEAL ECHOCARDIOGRAM (TEE);  Surgeon: Purcell Nailslarence H Owen, MD;  [facility];  Service: Open Heart Surgery;  Laterality: N/A;  . Clipping of atrial appendage Left 03/06/2016    Procedure: CLIPPING OF LEFT ATRIAL APPENDAGE using a 45 PRO2 AtriClip;  Surgeon: Purcell Nailslarence H Owen, MD;  Location: [facility] OR;  Service: Open Heart Surgery;  Laterality: Left;    Current Outpatient Prescriptions  Medication Sig Dispense Refill  . ALPRAZolam (XANAX) 0.5 MG tablet Take 0.5 mg by mouth 3  (three) times daily as needed for anxiety.     Marland Kitchen. aspirin EC 81 MG EC tablet Take 1 tablet (81 mg total) by mouth daily.    Marland Kitchen. FLUoxetine (PROZAC) 40 MG capsule Take 1 capsule (40 mg total) by mouth daily. 7 capsule 0  . metoprolol tartrate (LOPRESSOR) 25 MG tablet Take 0.5 tablets (12.5 mg total) by mouth 2 (two) times daily. 30 tablet 1  . traMADol (ULTRAM) 50 MG tablet Take 1-2 tablets (50-100 mg total) by mouth every 4 (four) hours as needed for moderate pain. 40 tablet 0  . warfarin (COUMADIN) 5 MG tablet Take 1 tablet (5 mg total) by mouth daily at 6 PM. Or as directed 30 tablet 1   No current facility-administered medications for this visit.    Allergies:   Review of patient's allergies indicates no known allergies.    Social History:  The patient  reports that she has never smoked. She has never used smokeless tobacco. She reports that she drinks about 6.0 oz of alcohol per week. She reports that she does not use illicit drugs.   Family History:  The patient's family history includes Cancer in her father; Cancer (age of onset: 4721) in her sister; Cancer (age of onset: 4285) in her mother; Hypertension in her mother.    ROS:  Please see the history of present illness. All other systems are reviewed and negative.    PHYSICAL EXAM: VS:  BP 96/70 mmHg  Pulse 74  Ht 5\' 6"  (1.676 m)  Wt 142 lb 12.8 oz (64.774 kg)  BMI 23.06 kg/m2 , BMI Body mass index is 23.06 kg/(m^2). GEN: Well nourished, well developed, female in no acute distress HEENT: normal for age  Neck: no JVD, no carotid bruit, no masses Cardiac: RRR; minimal murmur, no rubs, or gallops Respiratory:  clear to auscultation bilaterally, normal work of breathing GI: soft, nontender, nondistended, + BS MS: no deformity or atrophy; no edema; distal pulses are 2+ in all 4 extremities  Skin: warm and dry, no rash Neuro:  Strength and sensation are intact Psych: euthymic mood, full affect   EKG:  EKG is not ordered  today.  Recent Labs: 03/04/2016: ALT 12* 03/07/2016: Magnesium 2.2 03/09/2016: Hemoglobin 11.1*; Platelets 135* 03/11/2016: B Natriuretic Peptide 271.4* 03/12/2016: BUN 9; Creatinine, Ser 0.58; Potassium 3.5; Sodium 140    Lipid Panel No results found for: CHOL, TRIG, HDL, CHOLHDL, VLDL, LDLCALC, LDLDIRECT   Wt Readings from Last 3 Encounters:  03/26/16 142 lb 12.8 oz (64.774 kg)  03/25/16 143 lb (64.864 kg)  03/12/16 145 lb 8.1 oz (66 kg)     Other studies Reviewed: Additional studies/ records that were reviewed today include: Hospital records and office notes.  ASSESSMENT AND PLAN:  1.  S/P minimally invasive mitral valve repair with an annuloplasty ring: She does not wish to do cardiac rehabilitation, she feels that she is  increasing her activity successfully on her own. She is not having any signs or symptoms of heart failure. She has no CAD.  Continue her metoprolol for now. I would like to continue for 3 months postop. After that, we may have to stop it based on her blood pressure. She understands that if she continues to have problems with being lightheaded or dizzy, she is to call us and we can stop metoprolol. She is encouraged to drink enough water, but not too much. She is encouraged to make most of her liquid intake water which she states she will do.  Continue current therapy and follow-up with Dr. Royann Shivers in 3 months.  2. Anticoagulation with Coumadin: Her INR is not yet therapeutic. Her Coumadin is being managed by Phillips Hay, PharmD. Although surgery was minimally invasive, it was complex. Unless Dr. Cornelius Moras says differently, plan on continuing Coumadin for 3 months.   Current medicines are reviewed at length with the patient today.  The patient does not have concerns regarding medicines.  The following changes have been made:  no change  Labs/ tests ordered today include:  No orders of the defined types were placed in this encounter.     Disposition:   FU with  Croitoru  Signed, Leanna Battles  03/26/2016 9:19 AM    [facility] Phone: [phone]; Fax: [phone]  This note was written with the assistance of speech recognition software. Please excuse any transcriptional errors.

## 2016-03-27 ENCOUNTER — Telehealth: Payer: Self-pay | Admitting: Cardiovascular Disease

## 2016-03-27 MED ORDER — WARFARIN SODIUM 5 MG PO TABS: ORAL_TABLET | ORAL | Status: DC

## 2016-03-27 NOTE — Telephone Encounter (Signed)
Pt requesting refill of Coumadin, taking two some times, can get 60 ? [facility]

## 2016-03-28 ENCOUNTER — Other Ambulatory Visit: Payer: Self-pay | Admitting: Pharmacist

## 2016-03-28 ENCOUNTER — Telehealth: Payer: Self-pay | Admitting: *Deleted

## 2016-03-28 MED ORDER — WARFARIN SODIUM 5 MG PO TABS: ORAL_TABLET | ORAL | Status: DC

## 2016-03-28 NOTE — Telephone Encounter (Signed)
Pt called saying refill of Coumadin was not at her pharmacy. Looks like refill was sent yesterday but the rx was sent as print rather than e-scribed. Will send over again to pharmacy.

## 2016-03-28 NOTE — Telephone Encounter (Signed)
Called to get coumadin refill, transferred to coumadin clinic.

## 2016-04-01 ENCOUNTER — Ambulatory Visit (INDEPENDENT_AMBULATORY_CARE_PROVIDER_SITE_OTHER): Payer: BC Managed Care – PPO | Admitting: Pharmacist Clinician (PhC)/ Clinical Pharmacy Specialist

## 2016-04-01 DIAGNOSIS — Z7901 Long term (current) use of anticoagulants: Secondary | ICD-10-CM | POA: Diagnosis not present

## 2016-04-01 DIAGNOSIS — Z9889 Other specified postprocedural states: Secondary | ICD-10-CM

## 2016-04-01 LAB — POCT INR: INR: 1.6

## 2016-04-02 ENCOUNTER — Encounter: Payer: BC Managed Care – PPO | Admitting: Pharmacist Clinician (PhC)/ Clinical Pharmacy Specialist

## 2016-04-03 ENCOUNTER — Other Ambulatory Visit: Payer: Self-pay | Admitting: *Deleted

## 2016-04-03 DIAGNOSIS — G8918 Other acute postprocedural pain: Secondary | ICD-10-CM

## 2016-04-03 MED ORDER — TRAMADOL HCL 50 MG PO TABS: 50.0000 mg | ORAL_TABLET | ORAL | Status: DC | PRN

## 2016-04-03 NOTE — Telephone Encounter (Signed)
Becky Murphy is s/p MINI MVR 03/06/16. She has called for a refill for Tramadol.  I informed her that I would fax a new signed script to her pharmacy today. She agreed.

## 2016-04-09 ENCOUNTER — Ambulatory Visit (INDEPENDENT_AMBULATORY_CARE_PROVIDER_SITE_OTHER): Payer: BC Managed Care – PPO | Admitting: Pharmacist Clinician (PhC)/ Clinical Pharmacy Specialist

## 2016-04-09 DIAGNOSIS — Z7901 Long term (current) use of anticoagulants: Secondary | ICD-10-CM

## 2016-04-09 DIAGNOSIS — Z9889 Other specified postprocedural states: Secondary | ICD-10-CM

## 2016-04-09 LAB — POCT INR: INR: 3.1

## 2016-04-16 ENCOUNTER — Ambulatory Visit (INDEPENDENT_AMBULATORY_CARE_PROVIDER_SITE_OTHER): Payer: BC Managed Care – PPO | Admitting: Pharmacist Clinician (PhC)/ Clinical Pharmacy Specialist

## 2016-04-16 DIAGNOSIS — Z9889 Other specified postprocedural states: Secondary | ICD-10-CM

## 2016-04-16 DIAGNOSIS — Z7901 Long term (current) use of anticoagulants: Secondary | ICD-10-CM

## 2016-04-16 LAB — POCT INR: INR: 4.3

## 2016-04-19 ENCOUNTER — Other Ambulatory Visit: Payer: Self-pay | Admitting: Thoracic Surgery (Cardiothoracic Vascular Surgery)

## 2016-04-19 DIAGNOSIS — Z9889 Other specified postprocedural states: Secondary | ICD-10-CM

## 2016-04-22 ENCOUNTER — Encounter: Payer: Self-pay | Admitting: Thoracic Surgery (Cardiothoracic Vascular Surgery)

## 2016-04-22 ENCOUNTER — Ambulatory Visit (INDEPENDENT_AMBULATORY_CARE_PROVIDER_SITE_OTHER): Payer: Self-pay | Admitting: Thoracic Surgery (Cardiothoracic Vascular Surgery)

## 2016-04-22 ENCOUNTER — Ambulatory Visit
Admission: RE | Admit: 2016-04-22 | Discharge: 2016-04-22 | Disposition: A | Discharge status: Home / Self Care | Payer: BC Managed Care – PPO | Source: Ambulatory Visit | Attending: Thoracic Surgery (Cardiothoracic Vascular Surgery) | Admitting: Thoracic Surgery (Cardiothoracic Vascular Surgery)

## 2016-04-22 VITALS — BP 112/79 | HR 81 | Resp 16 | Ht 66.0 in | Wt 141.2 lb

## 2016-04-22 DIAGNOSIS — Z9889 Other specified postprocedural states: Secondary | ICD-10-CM

## 2016-04-22 DIAGNOSIS — I341 Nonrheumatic mitral (valve) prolapse: Secondary | ICD-10-CM

## 2016-04-22 NOTE — Progress Notes (Signed)
[address]       [place] [id number]             [phone]     CARDIOTHORACIC SURGERY OFFICE NOTE  Referring Provider is Croitoru, Rachelle HoraMihai, MD PCP is Kaleen MaskELKINS,WILSON OLIVER, MD   HPI:  Patient returns to the office today for routine follow-up approximately 6 weeks status post namely invasive mitral valve repair on 03/06/2016 for mitral valve prolapse with stage D severe [facility] primary mitral regurgitation. Her postoperative recovery has been uneventful and she was last seen here in our office on 03/25/2016. [facility] the [facility] by Theodore Demarkhonda Barrett And she returns to our office for routine follow-up today. She has had her prothrombin time checked and Coumadin dose adjusted through the Coumadin clinic. She returns to the office with her husband today. Her only complaint is that her stamina has not returned to baseline. She has minimal residual soreness in her chest and she has not been taking any sort of pain relievers for several weeks. She denies any shortness of breath. She is walking approximately 2 miles every day. Appetite is good. She has no other complaints.   Current Outpatient Prescriptions  Medication Sig Dispense Refill  . ALPRAZolam (XANAX) 0.5 MG tablet Take 0.5 mg by mouth 3 (three) times daily as needed for anxiety.     Marland Kitchen. aspirin EC 81 MG EC tablet Take 1 tablet (81 mg total) by mouth daily.    Marland Kitchen. FLUoxetine (PROZAC) 40 MG capsule Take 1 capsule (40 mg total) by mouth daily. 7 capsule 0  . metoprolol tartrate (LOPRESSOR) 25 MG tablet Take 0.5 tablets (12.5 mg total) by mouth 2 (two) times daily. 30 tablet 1  . traMADol (ULTRAM) 50 MG tablet Take 1-2 tablets (50-100 mg total) by mouth every 4 (four) hours as needed for moderate pain. 40 tablet 0  . warfarin (COUMADIN) 5 MG tablet Take 1.5 to 2 tablets by mouth daily as directed by coumadin clinic 60 tablet 2   No current facility-administered medications for this visit.        Physical Exam:   BP 112/79 mmHg  Pulse 81  Resp 16  Ht 5\' 6"  (1.676 m)  Wt 141 lb 3.2 oz (64.048 kg)  BMI 22.80 kg/m2  SpO2 98%  General:  Well-appearing  Chest:   Clear to auscultation  CV:   Regular rate and rhythm without murmur  Incisions:  Healing nicely  Abdomen:  Soft nontender  Extremities:  Warm and well-perfused  Diagnostic Tests:  CHEST 2 VIEW  COMPARISON: 03/25/2016.  FINDINGS: Mediastinum hilar structures are normal. Prior mitral valve repair. Left atrial appendage clip. Low lung volumes with mild basilar atelectasis. No pleural effusion or pneumothorax.  IMPRESSION: 1. Prior mitral valve repair. Left atrial appendage clip. Stable heart size.  2. Low lung volumes with mild basilar atelectasis.   Electronically Signed  By: Maisie Fushomas Register  On: 04/22/2016 10:14   Impression:  Patient is doing very well approximately 6 weeks status post minimally invasive mitral valve repair.   Plan:  I have encouraged the patient to continue to increase her activity gradually as tolerated without any particular limitations at this time. I have strongly encouraged her to get started in the cardiac rehabilitation program, as this will be the best way for her to restore her stamina. I think she may resume driving an automobile. We have not made any changes to her current medications. Because her  job requires some lifting and strenuous activity, she will need to remain out of work for approximately 6 more weeks. The patient will return for routine follow-up in 6 weeks. All of her questions have been addressed.   Salvatore Decent. Cornelius Moras, MD 04/22/2016 10:41 AM

## 2016-04-22 NOTE — Patient Instructions (Signed)
Continue all previous medications without any changes at this time  You may return to driving an automobile as long as you are no longer requiring oral narcotic pain relievers during the daytime.  It would be wise to start driving only short distances during the daylight and gradually increase from there as you feel comfortable.  You are encouraged to enroll and participate in the outpatient cardiac rehab program beginning as soon as practical.

## 2016-04-23 ENCOUNTER — Telehealth (HOSPITAL_COMMUNITY): Payer: Self-pay | Admitting: Cardiac Rehabilitation

## 2016-04-23 NOTE — Telephone Encounter (Signed)
Third and final attempt to contact pt to enroll in cardiac rehab. No answer, no vm.  Previous letter has been mailed to pt home with no response.

## 2016-04-24 ENCOUNTER — Telehealth (HOSPITAL_COMMUNITY): Payer: Self-pay | Admitting: Cardiac Rehabilitation

## 2016-04-24 NOTE — Telephone Encounter (Signed)
Cardiac rehab order received from [facility].   Phone to pt to discuss enrolling in cardiac rehab.  no answer on home and mobile number. No voicemail set up.  Letter mail to pt home address.

## 2016-04-26 ENCOUNTER — Ambulatory Visit (INDEPENDENT_AMBULATORY_CARE_PROVIDER_SITE_OTHER): Payer: BC Managed Care – PPO | Admitting: Pharmacist

## 2016-04-26 DIAGNOSIS — Z9889 Other specified postprocedural states: Secondary | ICD-10-CM

## 2016-04-26 DIAGNOSIS — Z7901 Long term (current) use of anticoagulants: Secondary | ICD-10-CM

## 2016-04-26 LAB — POCT INR: INR: 3

## 2016-05-02 ENCOUNTER — Encounter (HOSPITAL_COMMUNITY): Payer: Self-pay

## 2016-05-02 ENCOUNTER — Encounter (HOSPITAL_COMMUNITY)
Admission: RE | Admit: 2016-05-02 | Discharge: 2016-05-02 | Disposition: A | Discharge status: Home / Self Care | Payer: BC Managed Care – PPO | Source: Ambulatory Visit | Attending: Cardiovascular Disease | Admitting: Cardiovascular Disease

## 2016-05-02 VITALS — BP 108/60 | HR 80 | Ht 65.0 in | Wt 142.0 lb

## 2016-05-02 DIAGNOSIS — R011 Cardiac murmur, unspecified: Secondary | ICD-10-CM | POA: Insufficient documentation

## 2016-05-02 DIAGNOSIS — Z9889 Other specified postprocedural states: Secondary | ICD-10-CM | POA: Diagnosis not present

## 2016-05-02 DIAGNOSIS — I341 Nonrheumatic mitral (valve) prolapse: Secondary | ICD-10-CM | POA: Insufficient documentation

## 2016-05-02 DIAGNOSIS — F329 Major depressive disorder, single episode, unspecified: Secondary | ICD-10-CM | POA: Insufficient documentation

## 2016-05-02 DIAGNOSIS — Z79899 Other long term (current) drug therapy: Secondary | ICD-10-CM | POA: Insufficient documentation

## 2016-05-02 NOTE — Progress Notes (Signed)
Cardiac Rehab Medication Review by a Pharmacist  Does the patient  feel that his/her medications are working for him/her?  yes  Has the patient been experiencing any side effects to the medications prescribed?  no  Does the patient measure his/her own blood pressure or blood glucose at home?  yes   Does the patient have any problems obtaining medications due to transportation or finances?   no  Understanding of regimen: good Understanding of indications: good Potential of compliance: good  Pharmacist comments: 65 y/o F presents to cardiac rehab in good spirits and no distress. Pt endorses a good understanding of her medications. Pt verbalized understanding of her treatment plans.   [place] PinkGazda, Aide Wojnar P, PharmD 05/02/2016 8:50 AM

## 2016-05-02 NOTE — Progress Notes (Signed)
Cardiac Individual Treatment Plan  Patient Details  Name: Becky Murphy H Dejonge MRN: [id number] Date of Birth: 10-Aug-1951 Referring Provider:        CARDIAC REHAB PHASE [facility]   Referring Provider  Croitoru, Rachelle HoraMihai MD      Initial Encounter Date:       CARDIAC REHAB PHASE II ORIENTATION [facility] 05/02/2016 [facility]   Date  05/02/16   Referring Provider  Croitoru, Mihai MD      Visit Diagnosis: S/P MVR (mitral valve repair)  Patient's Home Medications on Admission:  Current outpatient prescriptions:  .  ALPRAZolam (XANAX) 0.5 MG tablet, Take 0.5 mg by mouth 3 (three) times daily as needed for anxiety. , Disp: , Rfl:  .  aspirin EC 81 MG EC tablet, Take 1 tablet (81 mg total) by mouth daily., Disp: , Rfl:  .  metoprolol tartrate (LOPRESSOR) 25 MG tablet, Take 0.5 tablets (12.5 mg total) by mouth 2 (two) times daily., Disp: 30 tablet, Rfl: 1 .  traMADol (ULTRAM) 50 MG tablet, Take 1-2 tablets (50-100 mg total) by mouth every 4 (four) hours as needed for moderate pain., Disp: 40 tablet, Rfl: 0 .  warfarin (COUMADIN) 5 MG tablet, Take 1.5 to 2 tablets by mouth daily as directed by coumadin clinic, Disp: 60 tablet, Rfl: 2 .  FLUoxetine (PROZAC) 40 MG capsule, Take 1 capsule (40 mg total) by mouth daily. (Patient not taking: Reported on 05/02/2016), Disp: 7 capsule, Rfl: 0  Past Medical History: Past Medical History  Diagnosis Date  . Anxiety   . Depression   . History of peptic ulcer "late 1970's"  . Arthritis     "back?" (11/03/2015)  . Migraine     "maybe 3-4/yr now" (11/03/2015)  . Mitral valve prolapse 12/26/2015    symptomatic"MVP surgery postponed to get lesion of pancreas evaluated first".  . 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 Chest pain 11/02/2015  . Pancreatic mass 01/29/2016    2.8 cm enhancing mass in the pancreatic neck and 3.1 cm low-attenuation cystic lesion in  body of pancreas noted on CT angiogram  . Thyroid nodule 01/29/2016    2.4 cm enhancing mixed cystic and solid nodule inferior left thyroid gland noted on CT angiogram  . PONV (postoperative nausea and vomiting)   . Heart murmur   . Exertional dyspnea 01/05/2016  . 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 Never Smoker   Smokeless tobacco  . Never Used    Labs: Recent Review Flowsheet Data    Labs for ITP Cardiac and Pulmonary Rehab Latest Ref Rng 03/06/2016 03/06/2016 03/06/2016 03/07/2016 03/07/2016   PHART 7.350 - 7.450 7.261(L) 7.291(L) - 7.300(L) -   PCO2ART 35.0 - 45.0 mmHg 42.9 45.2(H) - 41.6 -   HCO3 20.0 - 24.0 mEq/L 19.3(L) 21.8 - 20.5 -   TCO2 0 - 100 mmol/L 21 23 20 22 20    ACIDBASEDEF 0.0 - 2.0 mmol/L 7.0(H) 5.0(H) - 6.0(H) -   O2SAT - 95.0 96.0 - 97.0 -      Capillary Blood Glucose: Lab Results  Component Value Date   GLUCAP 109* 03/08/2016   GLUCAP 103* 03/08/2016  GLUCAP 100* 03/08/2016   GLUCAP 91 03/08/2016   GLUCAP 104* 03/07/2016     Exercise Target Goals: Date: 05/02/16  Exercise Program Goal: Individual exercise prescription set with THRR, safety & activity barriers. Participant demonstrates ability to understand and report RPE using BORG scale, to self-measure pulse accurately, and to acknowledge the importance of the exercise prescription.  Exercise Prescription Goal: Starting with aerobic activity 30 plus minutes a day, 3 days per week for initial exercise prescription. Provide home exercise prescription and guidelines that participant acknowledges understanding prior to discharge.  Activity Barriers & Risk Stratification:     Activity Barriers & Cardiac Risk Stratification -  05/02/16 0826    Activity Barriers & Cardiac Risk Stratification   Activity Barriers Other (comment)   Comments Right leg/knee pain occasionaly   Cardiac Risk Stratification High      6 Minute Walk:     6 Minute Walk      05/02/16 1154       6 Minute Walk   Phase Initial     Distance 1555 feet     Walk Time 6 minutes     # of Rest Breaks 0     MPH 2.94     METS 3.86     RPE 11     VO2 Peak 13.51     Symptoms No     Resting HR 80 bpm     Resting BP 108/60 mmHg     Max Ex. HR 86 bpm     Max Ex. BP 146/62 mmHg     2 Minute Post BP 111/76 mmHg        Initial Exercise Prescription:     Initial Exercise Prescription - 05/02/16 1100    Date of Initial Exercise RX and Referring Provider   Date 05/02/16   Referring Provider Croitoru, Mihai MD   Bike   Level 1   Minutes 10   METs 3.8   NuStep   Level 2   Minutes 10   METs 2   Track   Laps 10   Minutes 10   METs 2.76   Prescription Details   Frequency (times per week) 3   Duration Progress to 30 minutes of continuous aerobic without signs/symptoms of physical distress   Intensity   THRR 40-80% of Max Heartrate 63-126   Ratings of Perceived Exertion 11-13   Progression   Progression Continue to progress workloads to maintain intensity without signs/symptoms of physical distress.   Resistance Training   Training Prescription Yes   Weight 2 lbs   Reps 10-12      Perform Capillary Blood Glucose checks as needed.  Exercise Prescription Changes:   Exercise Comments:   Discharge Exercise Prescription (Final Exercise Prescription Changes):   Nutrition:  Target Goals: Understanding of nutrition guidelines, daily intake of sodium 1500mg , cholesterol 200mg , calories 30% from fat and 7% or less from saturated fats, daily to have 5 or more servings of fruits and vegetables.  Biometrics:     Pre Biometrics - 05/02/16 0804    Pre Biometrics   Waist Circumference 34 inches   Hip Circumference 40.5 inches    Waist to Hip Ratio 0.84 %   Triceps Skinfold 31 mm   % Body Fat 36.6 %   Grip Strength 29 kg   Flexibility 14 in   Single Leg Stand 30 seconds       Nutrition Therapy Plan and Nutrition Goals:   Nutrition Discharge: Nutrition Scores:   Nutrition  Goals Re-Evaluation:   Psychosocial: Target Goals: Acknowledge presence or absence of depression, maximize coping skills, provide positive support system. Participant is able to verbalize types and ability to use techniques and skills needed for reducing stress and depression.  Initial Review & Psychosocial Screening:     Initial Psych Review & Screening - 05/02/16 1348    Family Dynamics   Good Support System? Yes   Barriers   Psychosocial barriers to participate in program There are no identifiable barriers or psychosocial needs.   Screening Interventions   Interventions Encouraged to exercise      Quality of Life Scores:   PHQ-9:     Recent Review Flowsheet Data    There is no flowsheet data to display.      Psychosocial Evaluation and Intervention:   Psychosocial Re-Evaluation:   Vocational Rehabilitation: Provide vocational rehab assistance to qualifying candidates.   Vocational Rehab Evaluation & Intervention:     Vocational Rehab - 05/02/16 1347    Initial Vocational Rehab Evaluation & Intervention   Assessment shows need for Vocational Rehabilitation No      Education: Education Goals: Education classes will be provided on a weekly basis, covering required topics. Participant will state understanding/return demonstration of topics presented.  Learning Barriers/Preferences:     Learning Barriers/Preferences - 05/02/16 4696    Learning Barriers/Preferences   Learning Barriers Sight  contacts   Learning Preferences Written Material;Skilled Demonstration;Pictoral;Verbal Instruction;Video      Education Topics: Count Your Pulse:  -Group instruction provided by verbal instruction,  demonstration, patient participation and written materials to support subject.  Instructors address importance of being able to find your pulse and how to count your pulse when at home without a heart monitor.  Patients get hands on experience counting their pulse with staff help and individually.   Heart Attack, Angina, and Risk Factor Modification:  -Group instruction provided by verbal instruction, video, and written materials to support subject.  Instructors address signs and symptoms of angina and heart attacks.    Also discuss risk factors for heart disease and how to make changes to improve heart health risk factors.   Functional Fitness:  -Group instruction provided by verbal instruction, demonstration, patient participation, and written materials to support subject.  Instructors address safety measures for doing things around the house.  Discuss how to get up and down off the floor, how to pick things up properly, how to safely get out of a chair without assistance, and balance training.   Meditation and Mindfulness:  -Group instruction provided by verbal instruction, patient participation, and written materials to support subject.  Instructor addresses importance of mindfulness and meditation practice to help reduce stress and improve awareness.  Instructor also leads participants through a meditation exercise.    Stretching for Flexibility and Mobility:  -Group instruction provided by verbal instruction, patient participation, and written materials to support subject.  Instructors lead participants through series of stretches that are designed to increase flexibility thus improving mobility.  These stretches are additional exercise for major muscle groups that are typically performed during regular warm up and cool down.   Hands Only CPR Anytime:  -Group instruction provided by verbal instruction, video, patient participation and written materials to support subject.  Instructors  co-teach with AHA video for hands only CPR.  Participants get hands on experience with mannequins.   Nutrition I class: Heart Healthy Eating:  -Group instruction provided by PowerPoint slides, verbal discussion, and written materials to support subject matter. The instructor gives  an explanation and review of the Therapeutic Lifestyle Changes diet recommendations, which includes a discussion on lipid goals, dietary fat, sodium, fiber, plant stanol/sterol esters, sugar, and the components of a well-balanced, healthy diet.   Nutrition II class: Lifestyle Skills:  -Group instruction provided by PowerPoint slides, verbal discussion, and written materials to support subject matter. The instructor gives an explanation and review of label reading, grocery shopping for heart health, heart healthy recipe modifications, and ways to make healthier choices when eating out.   Diabetes Question & Answer:  -Group instruction provided by PowerPoint slides, verbal discussion, and written materials to support subject matter. The instructor gives an explanation and review of diabetes co-morbidities, pre- and post-prandial blood glucose goals, pre-exercise blood glucose goals, signs, symptoms, and treatment of hypoglycemia and hyperglycemia, and foot care basics.   Diabetes Blitz:  -Group instruction provided by PowerPoint slides, verbal discussion, and written materials to support subject matter. The instructor gives an explanation and review of the physiology behind type 1 and type 2 diabetes, diabetes medications and rational behind using different medications, pre- and post-prandial blood glucose recommendations and Hemoglobin A1c goals, diabetes diet, and exercise including blood glucose guidelines for exercising safely.    Portion Distortion:  -Group instruction provided by PowerPoint slides, verbal discussion, written materials, and food models to support subject matter. The instructor gives an explanation of  serving size versus portion size, changes in portions sizes over the last 20 years, and what consists of a serving from each food group.   Stress Management:  -Group instruction provided by verbal instruction, video, and written materials to support subject matter.  Instructors review role of stress in heart disease and how to cope with stress positively.     Exercising on Your Own:  -Group instruction provided by verbal instruction, power point, and written materials to support subject.  Instructors discuss benefits of exercise, components of exercise, frequency and intensity of exercise, and end points for exercise.  Also discuss use of nitroglycerin and activating EMS.  Review options of places to exercise outside of rehab.  Review guidelines for sex with heart disease.   Cardiac Drugs I:  -Group instruction provided by verbal instruction and written materials to support subject.  Instructor reviews cardiac drug classes: antiplatelets, anticoagulants, beta blockers, and statins.  Instructor discusses reasons, side effects, and lifestyle considerations for each drug class.   Cardiac Drugs II:  -Group instruction provided by verbal instruction and written materials to support subject.  Instructor reviews cardiac drug classes: angiotensin converting enzyme inhibitors (ACE-I), angiotensin II receptor blockers (ARBs), nitrates, and calcium channel blockers.  Instructor discusses reasons, side effects, and lifestyle considerations for each drug class.   Anatomy and Physiology of the Circulatory System:  -Group instruction provided by verbal instruction, video, and written materials to support subject.  Reviews functional anatomy of heart, how it relates to various diagnoses, and what role the heart plays in the overall system.   Knowledge Questionnaire Score:     Knowledge Questionnaire Score - 05/02/16 1227    Knowledge Questionnaire Score   Pre Score 19/24      Core Components/Risk  Factors/Patient Goals at Admission:     Personal Goals and Risk Factors at Admission - 05/02/16 0827    Core Components/Risk Factors/Patient Goals on Admission   Increase Strength and Stamina Yes   Intervention Provide advice, education, support and counseling about physical activity/exercise needs.;Develop an individualized exercise prescription for aerobic and resistive training based on initial evaluation findings, risk stratification,  comorbidities and participant's personal goals.   Expected Outcomes Achievement of increased cardiorespiratory fitness and enhanced flexibility, muscular endurance and strength shown through measurements of functional capacity and personal statement of participant.   Improve shortness of breath with ADL's Yes   Intervention Provide education, individualized exercise plan and daily activity instruction to help decrease symptoms of SOB with activities of daily living.   Expected Outcomes Short Term: Achieves a reduction of symptoms when performing activities of daily living.   Stress Yes   Intervention Offer individual and/or small group education and counseling on adjustment to heart disease, stress management and health-related lifestyle change. Teach and support self-help strategies.   Expected Outcomes Short Term: Participant demonstrates changes in health-related behavior, relaxation and other stress management skills, ability to obtain effective social support, and compliance with psychotropic medications if prescribed.;Long Term: Emotional wellbeing is indicated by absence of clinically significant psychosocial distress or social isolation.   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  Back to hiking.   Intervention Provide education and exercise guidelines to improve strength and cardiorespiratoty ftiness   Expected Outcomes Able to get back to yard work, hiking, and no  restricitions at work      Core Components/Risk Factors/Patient Goals Review:    Core Components/Risk Factors/Patient Goals at Discharge (Final Review):    ITP Comments:     ITP Comments      05/02/16 0824           ITP Comments Medical Director- Dr. Armanda Magic, MD          Comments: Patient attended orientation from 0800 to 1000 to review rules and guidelines for program. Completed 6 minute walk test, Intitial ITP, and exercise prescription.  VSS. Telemetry-Sinus rhythm with a rare PVC.  Asymptomatic.

## 2016-05-03 ENCOUNTER — Other Ambulatory Visit: Payer: Self-pay | Admitting: Physician Assistant

## 2016-05-08 ENCOUNTER — Encounter (HOSPITAL_COMMUNITY)
Admission: RE | Admit: 2016-05-08 | Discharge: 2016-05-08 | Disposition: A | Discharge status: Home / Self Care | Payer: BC Managed Care – PPO | Source: Ambulatory Visit | Attending: Cardiovascular Disease | Admitting: Cardiovascular Disease

## 2016-05-08 DIAGNOSIS — Z9889 Other specified postprocedural states: Secondary | ICD-10-CM

## 2016-05-08 NOTE — Progress Notes (Signed)
Daily Session Note  Patient Details  Name: Becky Murphy MRN: [id number] Date of Birth: May 07, 1951 Referring Provider:        CARDIAC REHAB PHASE II ORIENTATION from 05/02/2016 in [place]   Referring Provider  Sanda Klein MD      Encounter Date: 05/08/2016  Check In:     Session Check In - 05/08/16 1026    Check-In   Location [facility]   Staff Present Maurice Small, RN, BSN;Jessica Sturgeon, MA, ACSM RCEP, Exercise Physiologist;Joann Rion, RN, Roque Cash, RN   Supervising physician immediately available to respond to emergencies [facility] immediately available   Physician(s) Dr. Marily Memos   Medication changes reported     No   Fall or balance concerns reported    No   Warm-up and Cool-down Performed as group-led instruction   Resistance Training Performed No   VAD Patient? No   Pain Assessment   Currently in Pain? No/denies      Capillary Blood Glucose: No results found for this or any previous visit (from the past 24 hour(s)).   Goals Met:  Exercise tolerated well Personal goals reviewed  Goals Unmet:  Not Applicable  Comments:  Pt started full day of exercise today at the cardiac rehab Phase II program.   Pt tolerated light exercise without difficulty. VSS, telemetry-SR with no ectopy, asymptomatic.  Medication list reconciled.  Pt presently is not taking her Prozac.  Pt did not have a renewal for the medication and missed her annual appt due to hospitalization for heart surgery.  Based on how she is feeling presently, pt feels she does not need the medication.  Pt wonders if the depression-like  feelings she had were really more heart valve related and now that she has had her surgery there is no longer a need.  Pt denies barriers to medicaiton compliance.  PSYCHOSOCIAL ASSESSMENT:  PHQ-0. Pt exhibits positive coping skills, hopeful outlook with supportive family. No psychosocial needs identified at  this time, no psychosocial interventions necessary.    Pt enjoys outdoor activities. Pt desires to get back to yard work and [employer] at work.  Pt long term goal is too get back to mowing lawn and hiking.  Pt presently is walking every day.  Continue to check in with pt for progression toward meeting set goals.   Pt oriented to exercise equipment and routine.    Understanding verbalized.     Dr. Fransico Him is Medical Director for Cardiac Rehab at [facility].

## 2016-05-10 ENCOUNTER — Encounter (HOSPITAL_COMMUNITY)
Admission: RE | Admit: 2016-05-10 | Discharge: 2016-05-10 | Disposition: A | Discharge status: Home / Self Care | Payer: BC Managed Care – PPO | Source: Ambulatory Visit | Attending: Cardiovascular Disease | Admitting: Cardiovascular Disease

## 2016-05-10 ENCOUNTER — Ambulatory Visit (INDEPENDENT_AMBULATORY_CARE_PROVIDER_SITE_OTHER): Payer: BC Managed Care – PPO | Admitting: Pharmacist

## 2016-05-10 DIAGNOSIS — Z7901 Long term (current) use of anticoagulants: Secondary | ICD-10-CM

## 2016-05-10 DIAGNOSIS — Z9889 Other specified postprocedural states: Secondary | ICD-10-CM

## 2016-05-10 LAB — POCT INR: INR: 2

## 2016-05-13 ENCOUNTER — Encounter (HOSPITAL_COMMUNITY)
Admission: RE | Admit: 2016-05-13 | Discharge: 2016-05-13 | Disposition: A | Discharge status: Home / Self Care | Payer: BC Managed Care – PPO | Source: Ambulatory Visit | Attending: Cardiovascular Disease | Admitting: Cardiovascular Disease

## 2016-05-13 DIAGNOSIS — Z9889 Other specified postprocedural states: Secondary | ICD-10-CM | POA: Diagnosis not present

## 2016-05-13 NOTE — Progress Notes (Signed)
Becky RunningSandra H Murphy 65 y.o. female Nutrition Note Spoke with pt.  Nutrition Survey reviewed with pt. Pt is following Step 2 of the Therapeutic Lifestyle Changes diet. Pt is on Coumadin and is aware of the need to consume a diet with consistent vitamin K intake.  Pt expressed understanding of the information reviewed. Pt aware of nutrition education classes offered. Lab Results  Component Value Date   HGBA1C 5.0 03/04/2016   Wt Readings from Last 3 Encounters:  05/02/16 141 lb 15.6 oz (64.4 kg)  04/22/16 141 lb 3.2 oz (64.048 kg)  03/26/16 142 lb 12.8 oz (64.774 kg)   Nutrition Diagnosis ? Food-and nutrition-related knowledge deficit related to lack of exposure to information as related to diagnosis of: ? CVD Nutrition Intervention ? Benefits of adopting Therapeutic Lifestyle Changes discussed when Medficts reviewed. ? Pt to attend the Portion Distortion class ? Pt given handouts for: ? Nutrition I class ? Nutrition II class  ? Continue client-centered nutrition education by RD, as part of interdisciplinary care.  Goal(s) ? Pt to describe the benefit of including fruits, vegetables, whole grains, and low-fat dairy products in a heart healthy meal plan.  Monitor and Evaluate progress toward nutrition goal with team.  Mickle PlumbEdna Martia Dalby, M.Ed, RD, LDN, CDE 05/13/2016 10:26 AM

## 2016-05-14 ENCOUNTER — Other Ambulatory Visit: Payer: Self-pay

## 2016-05-14 MED ORDER — METOPROLOL TARTRATE 25 MG PO TABS: 12.5000 mg | ORAL_TABLET | Freq: Two times a day (BID) | ORAL | Status: DC

## 2016-05-14 NOTE — Telephone Encounter (Signed)
Rx Refill

## 2016-05-15 ENCOUNTER — Encounter: Payer: Self-pay | Admitting: Cardiovascular Disease

## 2016-05-15 ENCOUNTER — Encounter (HOSPITAL_COMMUNITY)
Admission: RE | Admit: 2016-05-15 | Discharge: 2016-05-15 | Disposition: A | Discharge status: Home / Self Care | Payer: BC Managed Care – PPO | Source: Ambulatory Visit | Attending: Cardiovascular Disease | Admitting: Cardiovascular Disease

## 2016-05-15 ENCOUNTER — Telehealth: Payer: Self-pay | Admitting: Cardiovascular Disease

## 2016-05-15 DIAGNOSIS — Z9889 Other specified postprocedural states: Secondary | ICD-10-CM | POA: Diagnosis not present

## 2016-05-15 NOTE — Telephone Encounter (Signed)
Becky Murphy from Cardiac Rehab called in stating that pt in today and BP 96/60 before exercise Hr in the 80's and peaked at 104/60 and HR in the 105. Pt has no complaints and she has only been taking her Lopressor 12.5mg  ONCE daily instead of TWICE daily as ordered as she needs to get refill. Pt is asymptomatic.  Want to make you aware and are their any medication adjustments you would like to make?

## 2016-05-15 NOTE — Progress Notes (Signed)
Becky Murphy's resting systolic blood pressures have been in the 90's systolic today at cardiac rehab. Patient asymptomatic with exercise today. Exit blood pressure 94/60 then 93/76 after drinking H20. Becky DavenportSandra says she is only taking 12.5 mg of metoprolol once a day since yesterday because she ran out of her prescription. Becky GibbsKim Hecker RN called and notified at Dr Croitoru's office. Will fax exercise flow sheets to Dr. Erin Hearingroitoru's office for review.

## 2016-05-15 NOTE — Telephone Encounter (Signed)
New message     [facility] pt's blood pressure, Becky HesselbachMaria wanting to speak with the nurse.

## 2016-05-15 NOTE — Telephone Encounter (Signed)
Copy of pt Cardiac Rehab flow sheets placed in Dr. Renaye Rakers's Box

## 2016-05-17 ENCOUNTER — Encounter (HOSPITAL_COMMUNITY)
Admission: RE | Admit: 2016-05-17 | Discharge: 2016-05-17 | Disposition: A | Discharge status: Home / Self Care | Payer: BC Managed Care – PPO | Source: Ambulatory Visit | Attending: Cardiovascular Disease | Admitting: Cardiovascular Disease

## 2016-05-17 DIAGNOSIS — Z9889 Other specified postprocedural states: Secondary | ICD-10-CM

## 2016-05-20 ENCOUNTER — Telehealth: Payer: Self-pay | Admitting: Cardiovascular Disease

## 2016-05-20 ENCOUNTER — Encounter (HOSPITAL_COMMUNITY)
Admission: RE | Admit: 2016-05-20 | Discharge: 2016-05-20 | Disposition: A | Discharge status: Home / Self Care | Payer: BC Managed Care – PPO | Source: Ambulatory Visit | Attending: Cardiovascular Disease | Admitting: Cardiovascular Disease

## 2016-05-20 DIAGNOSIS — Z9889 Other specified postprocedural states: Secondary | ICD-10-CM | POA: Diagnosis not present

## 2016-05-20 MED ORDER — METOPROLOL TARTRATE 25 MG PO TABS: 12.5000 mg | ORAL_TABLET | Freq: Two times a day (BID) | ORAL | Status: DC

## 2016-05-20 NOTE — Telephone Encounter (Signed)
Called and spoke to Becky Murphy and gave recommendations. Also spoke to patient and verified these instructions - advised no need to pick up metoprolol Requested pt continue to do routine BP checks and call if any issues w/ BP or new concerns. Pt voiced agreement and understanding.

## 2016-05-20 NOTE — Progress Notes (Signed)
Becky Murphy said that she took her last metoprolol on Saturday. Becky Murphy ran out of her  current prescription of metoprolol. Becky SlaughterNathan Rose RN called and notified at Dr Croitoru's office. Entry blood pressure 98/60. Dr Marney Settingroitru discontinued Becky Murphy's metoprolol. Becky Murphy spoke with Becky Murphy over the phone and instructed the patient about the medication change. Patient states understanding. Will continue to monitor the patient throughout  the program.

## 2016-05-20 NOTE — Telephone Encounter (Signed)
Patient at cardiac rehab today. Notes her BP has been running low recently. She is on metoprolol tartrate and wanting to know what to do w/ med.  Pt was checked at cardiac rehab - 118/86 w HR 63  Aware I have sent in refill for metoprolol tartrate (pt completely out of medication today, and a print refill rather than electronic was submitted by our office 5 days ago). Pt has been taking med 1 time daily x1 week to ration as she was running low last week - aware this should be taken BID -- She wants to check w/ Dr. Royann Shiversroitoru if ok to resume BID due to BPs running low.  BPs as noted by cardiac rehab last week were 90s-100s/60s. Pt has not been symptomatic w/ these pressures.  msg sent to Dr. Royann Shiversroitoru. Pt aware I will call her at home w/ final recommendations.

## 2016-05-20 NOTE — Telephone Encounter (Signed)
I think she no longer needs to take the metoprolol. She is already started "weaning it" inadvertently and I think she can just stop it now.

## 2016-05-21 NOTE — Progress Notes (Signed)
Cardiac Individual Treatment Plan  Patient Details  Name: Becky Murphy MRN: [id number] Date of Birth: March 20, 1951 Referring Provider:        CARDIAC REHAB PHASE II ORIENTATION from 05/02/2016 in [facility] CARDI[facility] [employer] [facility]   Referring Provider  Croitoru, Rachelle Hora MD      Initial Encounter Date:       CARDIAC REHAB PHASE II ORIENTATION from 05/02/2016 in [facility] CARDIAC REHAB   Date  05/02/16   Referring Provider  Croitoru, Mihai MD      Visit Diagnosis: S/P MVR (mitral valve repair)  Patient's Home Medications on Admission:  Current outpatient prescriptions:  .  ALPRAZolam (XANAX) 0.5 MG tablet, Take 0.5 mg by mouth 3 (three) times daily as needed for anxiety. , Disp: , Rfl:  .  aspirin EC 81 MG EC tablet, Take 1 tablet (81 mg total) by mouth daily., Disp: , Rfl:  .  FLUoxetine (PROZAC) 40 MG capsule, Take 1 capsule (40 mg total) by mouth daily. (Patient not taking: Reported on 05/02/2016), Disp: 7 capsule, Rfl: 0 .  traMADol (ULTRAM) 50 MG tablet, Take 1-2 tablets (50-100 mg total) by mouth every 4 (four) hours as needed for moderate pain., Disp: 40 tablet, Rfl: 0 .  warfarin (COUMADIN) 5 MG tablet, Take 1.5 to 2 tablets by mouth daily as directed by coumadin clinic, Disp: 60 tablet, Rfl: 2 .  [DISCONTINUED] metoprolol tartrate (LOPRESSOR) 25 MG tablet, Take 0.5 tablets (12.5 mg total) by mouth 2 (two) times daily., Disp: 60 tablet, Rfl: 1  Past Medical History: Past Medical History  Diagnosis Date  . Anxiety   . Depression   . History of peptic ulcer "late 1970's"  . Arthritis     "back?" (11/03/2015)  . Migraine     "maybe 3-4/yr now" (11/03/2015)  . Mitral valve prolapse 12/26/2015    symptomatic"MVP surgery postponed to get lesion of pancreas evaluated first".  . 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 Chest pain 11/02/2015  . Pancreatic mass 01/29/2016    2.8 cm enhancing mass in the pancreatic neck and 3.1 cm low-attenuation  cystic lesion in body of pancreas noted on CT angiogram  . Thyroid nodule 01/29/2016    2.4 cm enhancing mixed cystic and solid nodule inferior left thyroid gland noted on CT angiogram  . PONV (postoperative nausea and vomiting)   . Heart murmur   . Exertional dyspnea 01/05/2016  . 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 Never Smoker   Smokeless tobacco  . Never Used    Labs:     Recent Review Flowsheet Data    Labs for ITP Cardiac and Pulmonary Rehab Latest Ref Rng 03/06/2016 03/06/2016 03/06/2016 03/07/2016 03/07/2016   PHART 7.350 - 7.450 7.261(L) 7.291(L) - 7.300(L) -   PCO2ART 35.0 - 45.0 mmHg 42.9 45.2(H) - 41.6 -   HCO3 20.0 - 24.0 mEq/L 19.3(L) 21.8 - 20.5 -   TCO2 0 - 100 mmol/L 21 23 20 22 20    ACIDBASEDEF 0.0 - 2.0 mmol/L 7.0(H) 5.0(H) - 6.0(H) -   O2SAT - 95.0 96.0 - 97.0 -      Capillary Blood Glucose: Lab Results  Component Value Date   GLUCAP 109* 03/08/2016  GLUCAP 103* 03/08/2016   GLUCAP 100* 03/08/2016   GLUCAP 91 03/08/2016   GLUCAP 104* 03/07/2016     Exercise Target Goals:    Exercise Program Goal: Individual exercise prescription set with THRR, safety & activity barriers. Participant demonstrates ability to understand and report RPE using BORG scale, to self-measure pulse accurately, and to acknowledge the importance of the exercise prescription.  Exercise Prescription Goal: Starting with aerobic activity 30 plus minutes a day, 3 days per week for initial exercise prescription. Provide home exercise prescription and guidelines that participant acknowledges understanding prior to discharge.  Activity Barriers & Risk Stratification:     Activity Barriers & Cardiac Risk  Stratification - 05/02/16 0826    Activity Barriers & Cardiac Risk Stratification   Activity Barriers Other (comment)   Comments Right leg/knee pain occasionaly   Cardiac Risk Stratification High      6 Minute Walk:     6 Minute Walk      05/02/16 1154       6 Minute Walk   Phase Initial     Distance 1555 feet     Walk Time 6 minutes     # of Rest Breaks 0     MPH 2.94     METS 3.86     RPE 11     VO2 Peak 13.51     Symptoms No     Resting HR 80 bpm     Resting BP 108/60 mmHg     Max Ex. HR 86 bpm     Max Ex. BP 146/62 mmHg     2 Minute Post BP 111/76 mmHg        Initial Exercise Prescription:     Initial Exercise Prescription - 05/02/16 1100    Date of Initial Exercise RX and Referring Provider   Date 05/02/16   Referring Provider Croitoru, Mihai MD   Bike   Level 1   Minutes 10   METs 3.8   NuStep   Level 2   Minutes 10   METs 2   Track   Laps 10   Minutes 10   METs 2.76   Prescription Details   Frequency (times per week) 3   Duration Progress to 30 minutes of continuous aerobic without signs/symptoms of physical distress   Intensity   THRR 40-80% of Max Heartrate 63-126   Ratings of Perceived Exertion 11-13   Progression   Progression Continue to progress workloads to maintain intensity without signs/symptoms of physical distress.   Resistance Training   Training Prescription Yes   Weight 2 lbs   Reps 10-12      Perform Capillary Blood Glucose checks as needed.  Exercise Prescription Changes:      Exercise Prescription Changes      05/20/16 1600           Exercise Review   Progression Yes       Response to Exercise   Blood Pressure (Admit) 98/60 mmHg       Blood Pressure (Exercise) 100/60 mmHg       Blood Pressure (Exit) 98/62 mmHg       Heart Rate (Admit) 83 bpm       Heart Rate (Exercise) 120 bpm       Heart Rate (Exit) 83 bpm       Rating of Perceived Exertion (Exercise) 11       Symptoms none       Duration Progress to  30 minutes of continuous  aerobic without signs/symptoms of physical distress       Intensity THRR unchanged       Progression   Progression Continue to progress workloads to maintain intensity without signs/symptoms of physical distress.       Average METs 3.3       Resistance Training   Training Prescription Yes       Weight 2 lbs       Reps 10-12       Interval Training   Interval Training No       Bike   Level 1       Minutes 10       METs 3.87       NuStep   Level 4       Minutes 10       METs 3.4       Track   Laps 12       Minutes 10       METs 3.09          Exercise Comments:      Exercise Comments      05/20/16 1657           Exercise Comments Pt off to a good start with exercise.          Discharge Exercise Prescription (Final Exercise Prescription Changes):     Exercise Prescription Changes - 05/20/16 1600    Exercise Review   Progression Yes   Response to Exercise   Blood Pressure (Admit) 98/60 mmHg   Blood Pressure (Exercise) 100/60 mmHg   Blood Pressure (Exit) 98/62 mmHg   Heart Rate (Admit) 83 bpm   Heart Rate (Exercise) 120 bpm   Heart Rate (Exit) 83 bpm   Rating of Perceived Exertion (Exercise) 11   Symptoms none   Duration Progress to 30 minutes of continuous aerobic without signs/symptoms of physical distress   Intensity THRR unchanged   Progression   Progression Continue to progress workloads to maintain intensity without signs/symptoms of physical distress.   Average METs 3.3   Resistance Training   Training Prescription Yes   Weight 2 lbs   Reps 10-12   Interval Training   Interval Training No   Bike   Level 1   Minutes 10   METs 3.87   NuStep   Level 4   Minutes 10   METs 3.4   Track   Laps 12   Minutes 10   METs 3.09      Nutrition:  Target Goals: Understanding of nutrition guidelines, daily intake of sodium 1500mg , cholesterol 200mg , calories 30% from fat and 7% or less from saturated fats, daily to have 5 or  more servings of fruits and vegetables.  Biometrics:     Pre Biometrics - 05/02/16 0804    Pre Biometrics   Waist Circumference 34 inches   Hip Circumference 40.5 inches   Waist to Hip Ratio 0.84 %   Triceps Skinfold 31 mm   % Body Fat 36.6 %   Grip Strength 29 kg   Flexibility 14 in   Single Leg Stand 30 seconds       Nutrition Therapy Plan and Nutrition Goals:     Nutrition Therapy & Goals - 05/02/16 1425    Nutrition Therapy   Diet Therapeutic Lifestyle Changes   Drug/Food Interactions Coumadin/Vit K   Personal Nutrition Goals   Personal Goal #1 Maintain current wt ~141 lb while in Cardiac Rehab   Intervention Plan   Intervention  Prescribe, educate and counsel regarding individualized specific dietary modifications aiming towards targeted core components such as weight, hypertension, lipid management, diabetes, heart failure and other comorbidities.   Expected Outcomes Short Term Goal: Understand basic principles of dietary content, such as calories, fat, sodium, cholesterol and nutrients.;Long Term Goal: Adherence to prescribed nutrition plan.      Nutrition Discharge: Nutrition Scores:     Nutrition Assessments - 05/03/16 1001    MEDFICTS Scores   Pre Score 37      Nutrition Goals Re-Evaluation:   Psychosocial: Target Goals: Acknowledge presence or absence of depression, maximize coping skills, provide positive support system. Participant is able to verbalize types and ability to use techniques and skills needed for reducing stress and depression.  Initial Review & Psychosocial Screening:     Initial Psych Review & Screening - 05/02/16 1348    Family Dynamics   Good Support System? Yes   Barriers   Psychosocial barriers to participate in program There are no identifiable barriers or psychosocial needs.   Screening Interventions   Interventions Encouraged to exercise      Quality of Life Scores:     Quality of Life - 05/17/16 0707    Quality of  Life Scores   Health/Function Pre 24.3 %   Socioeconomic Pre 22.81 %   Psych/Spiritual Pre 28.07 %   Family Pre 30 %   GLOBAL Pre 25.53 %      PHQ-9:     Recent Review Flowsheet Data    Depression screen [facility] 2/9 05/08/2016   Decreased Interest 0   Down, Depressed, Hopeless 0   PHQ - 2 Score 0      Psychosocial Evaluation and Intervention:   Psychosocial Re-Evaluation:     Psychosocial Re-Evaluation      05/21/16 1121           Psychosocial Re-Evaluation   Interventions Encouraged to attend Cardiac Rehabilitation for the exercise       Continued Psychosocial Services Needed No          Vocational Rehabilitation: Provide vocational rehab assistance to qualifying candidates.   Vocational Rehab Evaluation & Intervention:     Vocational Rehab - 05/02/16 1347    Initial Vocational Rehab Evaluation & Intervention   Assessment shows need for Vocational Rehabilitation No      Education: Education Goals: Education classes will be provided on a weekly basis, covering required topics. Participant will state understanding/return demonstration of topics presented.  Learning Barriers/Preferences:     Learning Barriers/Preferences - 05/02/16 7829    Learning Barriers/Preferences   Learning Barriers Sight  contacts   Learning Preferences Written Material;Skilled Demonstration;Pictoral;Verbal Instruction;Video      Education Topics: Count Your Pulse:  -Group instruction provided by verbal instruction, demonstration, patient participation and written materials to support subject.  Instructors address importance of being able to find your pulse and how to count your pulse when at home without a heart monitor.  Patients get hands on experience counting their pulse with staff help and individually.   Heart Attack, Angina, and Risk Factor Modification:  -Group instruction provided by verbal instruction, video, and written materials to support subject.  Instructors  address signs and symptoms of angina and heart attacks.    Also discuss risk factors for heart disease and how to make changes to improve heart health risk factors.   Functional Fitness:  -Group instruction provided by verbal instruction, demonstration, patient participation, and written materials to support subject.  Instructors address safety measures for  doing things around the house.  Discuss how to get up and down off the floor, how to pick things up properly, how to safely get out of a chair without assistance, and balance training.      CARDIAC REHAB PHASE II EXERCISE from 05/17/2016 in [facility] CARDIAC REHAB   Date  05/17/16   Educator  Joice Lofts Fair   Instruction Review Code  2- meets goals/outcomes      Meditation and Mindfulness:  -Group instruction provided by verbal instruction, patient participation, and written materials to support subject.  Instructor addresses importance of mindfulness and meditation practice to help reduce stress and improve awareness.  Instructor also leads participants through a meditation exercise.    Stretching for Flexibility and Mobility:  -Group instruction provided by verbal instruction, patient participation, and written materials to support subject.  Instructors lead participants through series of stretches that are designed to increase flexibility thus improving mobility.  These stretches are additional exercise for major muscle groups that are typically performed during regular warm up and cool down.   Hands Only CPR Anytime:  -Group instruction provided by verbal instruction, video, patient participation and written materials to support subject.  Instructors co-teach with AHA video for hands only CPR.  Participants get hands on experience with mannequins.   Nutrition I class: Heart Healthy Eating:  -Group instruction provided by PowerPoint slides, verbal discussion, and written materials to support subject matter. The instructor  gives an explanation and review of the Therapeutic Lifestyle Changes diet recommendations, which includes a discussion on lipid goals, dietary fat, sodium, fiber, plant stanol/sterol esters, sugar, and the components of a well-balanced, healthy diet.      CARDIAC REHAB PHASE II EXERCISE from 05/15/2016 in [facility] CARDIAC REHAB   Date  05/13/16   Educator  RD   Instruction Review Code  Not applicable [Class handout given]      Nutrition II class: Lifestyle Skills:  -Group instruction provided by PowerPoint slides, verbal discussion, and written materials to support subject matter. The instructor gives an explanation and review of label reading, grocery shopping for heart health, heart healthy recipe modifications, and ways to make healthier choices when eating out.      CARDIAC REHAB PHASE II EXERCISE from 05/15/2016 in [facility] CARDIAC REHAB   Date  05/13/16   Educator  RD   Instruction Review Code  Not applicable [Class handouts given]      Diabetes Question & Answer:  -Group instruction provided by PowerPoint slides, verbal discussion, and written materials to support subject matter. The instructor gives an explanation and review of diabetes co-morbidities, pre- and post-prandial blood glucose goals, pre-exercise blood glucose goals, signs, symptoms, and treatment of hypoglycemia and hyperglycemia, and foot care basics.   Diabetes Blitz:  -Group instruction provided by PowerPoint slides, verbal discussion, and written materials to support subject matter. The instructor gives an explanation and review of the physiology behind type 1 and type 2 diabetes, diabetes medications and rational behind using different medications, pre- and post-prandial blood glucose recommendations and Hemoglobin A1c goals, diabetes diet, and exercise including blood glucose guidelines for exercising safely.    Portion Distortion:  -Group instruction provided by PowerPoint  slides, verbal discussion, written materials, and food models to support subject matter. The instructor gives an explanation of serving size versus portion size, changes in portions sizes over the last 20 years, and what consists of a serving from each food group.   Stress Management:  -  Group instruction provided by verbal instruction, video, and written materials to support subject matter.  Instructors review role of stress in heart disease and how to cope with stress positively.     Exercising on Your Own:  -Group instruction provided by verbal instruction, power point, and written materials to support subject.  Instructors discuss benefits of exercise, components of exercise, frequency and intensity of exercise, and end points for exercise.  Also discuss use of nitroglycerin and activating EMS.  Review options of places to exercise outside of rehab.  Review guidelines for sex with heart disease.   Cardiac Drugs I:  -Group instruction provided by verbal instruction and written materials to support subject.  Instructor reviews cardiac drug classes: antiplatelets, anticoagulants, beta blockers, and statins.  Instructor discusses reasons, side effects, and lifestyle considerations for each drug class.   Cardiac Drugs II:  -Group instruction provided by verbal instruction and written materials to support subject.  Instructor reviews cardiac drug classes: angiotensin converting enzyme inhibitors (ACE-I), angiotensin II receptor blockers (ARBs), nitrates, and calcium channel blockers.  Instructor discusses reasons, side effects, and lifestyle considerations for each drug class.   Anatomy and Physiology of the Circulatory System:  -Group instruction provided by verbal instruction, video, and written materials to support subject.  Reviews functional anatomy of heart, how it relates to various diagnoses, and what role the heart plays in the overall system.          CARDIAC REHAB PHASE II EXERCISE  from 05/15/2016 in [facility] CARDIAC REHAB   Date  05/15/16   Instruction Review Code  2- meets goals/outcomes      Knowledge Questionnaire Score:     Knowledge Questionnaire Score - 05/02/16 1227    Knowledge Questionnaire Score   Pre Score 19/24      Core Components/Risk Factors/Patient Goals at Admission:     Personal Goals and Risk Factors at Admission - 05/02/16 0827    Core Components/Risk Factors/Patient Goals on Admission   Increase Strength and Stamina Yes   Intervention Provide advice, education, support and counseling about physical activity/exercise needs.;Develop an individualized exercise prescription for aerobic and resistive training based on initial evaluation findings, risk stratification, comorbidities and participant's personal goals.   Expected Outcomes Achievement of increased cardiorespiratory fitness and enhanced flexibility, muscular endurance and strength shown through measurements of functional capacity and personal statement of participant.   Improve shortness of breath with ADL's Yes   Intervention Provide education, individualized exercise plan and daily activity instruction to help decrease symptoms of SOB with activities of daily living.   Expected Outcomes Short Term: Achieves a reduction of symptoms when performing activities of daily living.   Stress Yes   Intervention Offer individual and/or small group education and counseling on adjustment to heart disease, stress management and health-related lifestyle change. Teach and support self-help strategies.   Expected Outcomes Short Term: Participant demonstrates changes in health-related behavior, relaxation and other stress management skills, ability to obtain effective social support, and compliance with psychotropic medications if prescribed.;Long Term: Emotional wellbeing is indicated by absence of clinically significant psychosocial distress or social isolation.   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  Back to hiking.   Intervention Provide education and exercise guidelines to improve strength and cardiorespiratoty ftiness   Expected Outcomes Able to get back to yard work, hiking, and no restricitions at work  Core Components/Risk Factors/Patient Goals Review:    Core Components/Risk Factors/Patient Goals at Discharge (Final Review):    ITP Comments:     ITP Comments      05/02/16 0824           ITP Comments Medical Director- Dr. Armanda Magic, MD          Comments: Pt is making expected progress toward personal goals after completing 7 sessions. Recommend continued exercise and life style modification education including  stress management and relaxation techniques to decrease cardiac risk profile. Santiana reports that she  Is feeling stronger and is enjoying participating in cardiac rehab.Gladstone Lighter, RN,BSN 05/23/2016 9:40 AM

## 2016-05-22 ENCOUNTER — Encounter (HOSPITAL_COMMUNITY)
Admission: RE | Admit: 2016-05-22 | Discharge: 2016-05-22 | Disposition: A | Discharge status: Home / Self Care | Payer: BC Managed Care – PPO | Source: Ambulatory Visit | Attending: Cardiovascular Disease | Admitting: Cardiovascular Disease

## 2016-05-22 DIAGNOSIS — Z9889 Other specified postprocedural states: Secondary | ICD-10-CM

## 2016-05-24 ENCOUNTER — Encounter (HOSPITAL_COMMUNITY): Payer: BC Managed Care – PPO

## 2016-05-27 ENCOUNTER — Encounter (HOSPITAL_COMMUNITY): Payer: BC Managed Care – PPO

## 2016-05-29 ENCOUNTER — Encounter (HOSPITAL_COMMUNITY)
Admission: RE | Admit: 2016-05-29 | Discharge: 2016-05-29 | Disposition: A | Discharge status: Home / Self Care | Payer: BC Managed Care – PPO | Source: Ambulatory Visit | Attending: Cardiovascular Disease | Admitting: Cardiovascular Disease

## 2016-05-29 DIAGNOSIS — Z9889 Other specified postprocedural states: Secondary | ICD-10-CM | POA: Diagnosis not present

## 2016-05-29 NOTE — Progress Notes (Signed)
Reviewed home exercise guidelines with patient including endpoints, temperature precautions, target heart rate and rate of perceived exertion. Pt is currently walking 50 minutes twice daily on the days she doesn't attend cardiac rehab as her mode of home exercise. Pt voices understanding of instructions given. [name], MS, ACSM CCEP

## 2016-05-30 ENCOUNTER — Inpatient Hospital Stay (HOSPITAL_COMMUNITY): Admission: RE | Admit: 2016-05-30 | Payer: BC Managed Care – PPO | Source: Ambulatory Visit

## 2016-05-31 ENCOUNTER — Encounter (HOSPITAL_COMMUNITY)
Admission: RE | Admit: 2016-05-31 | Discharge: 2016-05-31 | Disposition: A | Discharge status: Home / Self Care | Payer: BC Managed Care – PPO | Source: Ambulatory Visit | Attending: Cardiovascular Disease | Admitting: Cardiovascular Disease

## 2016-05-31 DIAGNOSIS — Z9889 Other specified postprocedural states: Secondary | ICD-10-CM

## 2016-05-31 DIAGNOSIS — F329 Major depressive disorder, single episode, unspecified: Secondary | ICD-10-CM | POA: Diagnosis not present

## 2016-05-31 DIAGNOSIS — Z79899 Other long term (current) drug therapy: Secondary | ICD-10-CM | POA: Insufficient documentation

## 2016-05-31 DIAGNOSIS — I341 Nonrheumatic mitral (valve) prolapse: Secondary | ICD-10-CM | POA: Diagnosis present

## 2016-05-31 DIAGNOSIS — R011 Cardiac murmur, unspecified: Secondary | ICD-10-CM | POA: Insufficient documentation

## 2016-06-03 ENCOUNTER — Encounter (HOSPITAL_COMMUNITY)
Admission: RE | Admit: 2016-06-03 | Discharge: 2016-06-03 | Disposition: A | Discharge status: Home / Self Care | Payer: BC Managed Care – PPO | Source: Ambulatory Visit | Attending: Cardiovascular Disease | Admitting: Cardiovascular Disease

## 2016-06-03 ENCOUNTER — Ambulatory Visit (INDEPENDENT_AMBULATORY_CARE_PROVIDER_SITE_OTHER): Payer: BC Managed Care – PPO | Admitting: Pharmacist

## 2016-06-03 ENCOUNTER — Ambulatory Visit (HOSPITAL_COMMUNITY): Payer: BC Managed Care – PPO

## 2016-06-03 ENCOUNTER — Encounter: Payer: Self-pay | Admitting: Thoracic Surgery (Cardiothoracic Vascular Surgery)

## 2016-06-03 DIAGNOSIS — Z7901 Long term (current) use of anticoagulants: Secondary | ICD-10-CM | POA: Diagnosis not present

## 2016-06-03 DIAGNOSIS — Z9889 Other specified postprocedural states: Secondary | ICD-10-CM

## 2016-06-03 LAB — POCT INR: INR: 2.4

## 2016-06-05 ENCOUNTER — Ambulatory Visit (HOSPITAL_COMMUNITY): Payer: BC Managed Care – PPO

## 2016-06-05 ENCOUNTER — Encounter: Payer: Self-pay | Admitting: Thoracic Surgery (Cardiothoracic Vascular Surgery)

## 2016-06-05 ENCOUNTER — Encounter (HOSPITAL_COMMUNITY)
Admission: RE | Admit: 2016-06-05 | Discharge: 2016-06-05 | Disposition: A | Discharge status: Home / Self Care | Payer: BC Managed Care – PPO | Source: Ambulatory Visit | Attending: Cardiovascular Disease | Admitting: Cardiovascular Disease

## 2016-06-05 VITALS — BP 100/60 | HR 99 | Ht 65.0 in | Wt 144.0 lb

## 2016-06-05 DIAGNOSIS — Z9889 Other specified postprocedural states: Secondary | ICD-10-CM | POA: Diagnosis not present

## 2016-06-05 NOTE — Progress Notes (Signed)
Discharge Summary  Patient Details  Name: Becky Murphy MRN: [id number] Date of Birth: 1951-09-03 Referring Provider:            CARDIAC REHAB PHASE II ORIENTATION from 05/02/2016 in [place]   Referring Provider  Croitoru, Mihai MD       Number of Visits: 12  Reason for Discharge:  Early Exit:  Back to work  Smoking History:  History  Smoking status  . Never Smoker   Smokeless tobacco  . Never Used    Diagnosis:  S/P MVR (mitral valve repair)  ADL UCSD:   Initial Exercise Prescription:     Initial Exercise Prescription - 05/02/16 1100    Date of Initial Exercise RX and Referring Provider   Date 05/02/16   Referring Provider Croitoru, Mihai MD   Bike   Level 1   Minutes 10   METs 3.8   NuStep   Level 2   Minutes 10   METs 2   Track   Laps 10   Minutes 10   METs 2.76   Prescription Details   Frequency (times per week) 3   Duration Progress to 30 minutes of continuous aerobic without signs/symptoms of physical distress   Intensity   THRR 40-80% of Max Heartrate 63-126   Ratings of Perceived Exertion 11-13   Progression   Progression Continue to progress workloads to maintain intensity without signs/symptoms of physical distress.   Resistance Training   Training Prescription Yes   Weight 2 lbs   Reps 10-12      Discharge Exercise Prescription (Final Exercise Prescription Changes):     Exercise Prescription Changes - 06/05/16 1100    Exercise Review   Progression Yes   Response to Exercise   Blood Pressure (Admit) 100/60 mmHg   Blood Pressure (Exercise) 118/70 mmHg   Blood Pressure (Exit) 102/80 mmHg   Heart Rate (Admit) 99 bpm   Heart Rate (Exercise) 126 bpm   Heart Rate (Exit) 99 bpm   Rating of Perceived Exertion (Exercise) 12   Symptoms none   Comments Pt graduated today. Plans to continue exercise independently.   Duration Progress to 50 minutes of aerobic without signs/symptoms of physical distress   Intensity --   Progression   Progression --   Average METs 3.8   Resistance Training   Training Prescription Yes   Weight 2 lbs   Reps 10-12   Interval Training   Interval Training No   Bike   Level 1   Minutes 10   METs 3.93   NuStep   Level 4   Minutes 10   METs 3.6   Track   Laps 11   Minutes 10   METs 2.92   Home Exercise Plan   Plans to continue exercise at Home   Frequency Add 4 additional days to program exercise sessions.      Functional Capacity:     6 Minute Walk      05/02/16 1154 06/03/16 0720     6 Minute Walk   Phase Initial Discharge    Distance 1555 feet 1810 feet    Distance % Change  16.4 %    Walk Time 6 minutes 6 minutes    # of Rest Breaks 0 0    MPH 2.94 3.43    METS 3.86 4.25    RPE 11 10    VO2 Peak 13.51 14.89    Symptoms No No    Resting HR  80 bpm 98 bpm    Resting BP 108/60 mmHg 122/70 mmHg    Max Ex. HR 86 bpm 114 bpm    Max Ex. BP 146/62 mmHg 108/78 mmHg    2 Minute Post BP 111/76 mmHg 110/80 mmHg       Psychological, QOL, Others - Outcomes: PHQ 2/9: Depression screen [name] 2/9 06/05/2016 05/08/2016  Decreased Interest 0 0  Down, Depressed, Hopeless 0 0  PHQ - 2 Score 0 0    Quality of Life:     Quality of Life - 06/03/16 0708    Quality of Life Scores   Health/Function Pre 24.3 %   Health/Function Post 26.8 %   Health/Function % Change 10.29 %   Socioeconomic Pre 22.81 %   Socioeconomic Post 25.38 %   Socioeconomic % Change  11.27 %   Psych/Spiritual Pre 28.07 %   Psych/Spiritual Post 28.29 %   Psych/Spiritual % Change 0.78 %   Family Pre 30 %   Family Post 30 %   Family % Change 0 %   GLOBAL Pre 25.53 %   GLOBAL Post 27.23 %   GLOBAL % Change 6.66 %      Personal Goals: Goals established at orientation with interventions provided to work toward goal.     Personal Goals and Risk Factors at Admission - 05/02/16 0827    Core Components/Risk Factors/Patient Goals on Admission   Increase Strength and  Stamina Yes   Intervention Provide advice, education, support and counseling about physical activity/exercise needs.;Develop an individualized exercise prescription for aerobic and resistive training based on initial evaluation findings, risk stratification, comorbidities and participant's personal goals.   Expected Outcomes Achievement of increased cardiorespiratory fitness and enhanced flexibility, muscular endurance and strength shown through measurements of functional capacity and personal statement of participant.   Improve shortness of breath with ADL's Yes   Intervention Provide education, individualized exercise plan and daily activity instruction to help decrease symptoms of SOB with activities of daily living.   Expected Outcomes Short Term: Achieves a reduction of symptoms when performing activities of daily living.   Stress Yes   Intervention Offer individual and/or small group education and counseling on adjustment to heart disease, stress management and health-related lifestyle change. Teach and support self-help strategies.   Expected Outcomes Short Term: Participant demonstrates changes in health-related behavior, relaxation and other stress management skills, ability to obtain effective social support, and compliance with psychotropic medications if prescribed.;Long Term: Emotional wellbeing is indicated by absence of clinically significant psychosocial distress or social isolation.   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  Back to hiking.   Intervention Provide education and exercise guidelines to improve strength and cardiorespiratoty ftiness   Expected Outcomes Able to get back to yard work, hiking, and no restricitions at work       Personal Goals Discharge:     Goals and Risk Factor Review      06/05/16 1123           Core Components/Risk Factors/Patient Goals Review   Personal Goals  Review Increase Strength and Stamina;Stress;Other       Review Pt has maintained weight during the course of the program. Pt is back to walking 50 minutes twice daily, and is able to do ADLs comfortably. Pt feels she is doing well coping with stress.        Expected Outcomes Pt will maintain  current walking program to [place] [facility].          Nutrition & Weight - Outcomes:     Pre Biometrics - 05/02/16 0804    Pre Biometrics   Waist Circumference 34 inches   Hip Circumference 40.5 inches   Waist to Hip Ratio 0.84 %   Triceps Skinfold 31 mm   % Body Fat 36.6 %   Grip Strength 29 kg   Flexibility 14 in   Single Leg Stand 30 seconds         Post Biometrics - 06/05/16 1117     Post  Biometrics   Waist Circumference 32.5 inches   Hip Circumference 39.5 inches   Waist to Hip Ratio 0.82 %   Triceps Skinfold 27.5 mm   % Body Fat 35.6 %   Grip Strength 28 kg   Flexibility 14 in   Single Leg Stand 14.09 seconds      Nutrition:     Nutrition Therapy & Goals - 05/02/16 1425    Nutrition Therapy   Diet Therapeutic Lifestyle Changes   Drug/Food Interactions Coumadin/Vit K   Personal Nutrition Goals   Personal Goal #1 Maintain current wt ~141 lb while in [place], educate and counsel regarding individualized specific dietary modifications aiming towards targeted core components such as weight, hypertension, lipid management, diabetes, heart failure and other comorbidities.   Expected Outcomes Short Term Goal: Understand basic principles of dietary content, such as calories, fat, sodium, cholesterol and nutrients.;Long Term Goal: Adherence to prescribed nutrition plan.      Nutrition Discharge:     Nutrition Assessments - 06/28/16 1216    MEDFICTS Scores   Pre Score 37   Post Score --  post-score not available at this time      Education Questionnaire Score:     Knowledge Questionnaire Score - 06/03/16  0708    Knowledge Questionnaire Score   Pre Score 19/24   Post Score 22/24      Goals reviewed with patient; copy given to patient.Pt graduated from cardiac rehab program today with completion of 12 exercise sessions in Phase II. Pt maintained good attendance and progressed nicely during his participation in rehab as evidenced by increased MET level.   Medication list reconciled. Repeat  PHQ score-  0.  Pt has made significant lifestyle changes and should be commended for his success. Pt feels he has achieved his goals during cardiac rehab.   Pt plans to continue exercise by walking on her own. Becky Murphy is happy that she participated in cardiac rehab and says it has been beneficial to her recovery.

## 2016-06-07 ENCOUNTER — Ambulatory Visit (HOSPITAL_COMMUNITY): Payer: BC Managed Care – PPO

## 2016-06-07 ENCOUNTER — Ambulatory Visit (INDEPENDENT_AMBULATORY_CARE_PROVIDER_SITE_OTHER): Payer: BC Managed Care – PPO | Admitting: Thoracic Surgery (Cardiothoracic Vascular Surgery)

## 2016-06-07 ENCOUNTER — Encounter: Payer: Self-pay | Admitting: Thoracic Surgery (Cardiothoracic Vascular Surgery)

## 2016-06-07 VITALS — BP 137/97 | HR 90 | Resp 16 | Ht 66.0 in | Wt 143.0 lb

## 2016-06-07 DIAGNOSIS — I340 Nonrheumatic mitral (valve) insufficiency: Secondary | ICD-10-CM

## 2016-06-07 DIAGNOSIS — I341 Nonrheumatic mitral (valve) prolapse: Secondary | ICD-10-CM

## 2016-06-07 DIAGNOSIS — Z9889 Other specified postprocedural states: Secondary | ICD-10-CM

## 2016-06-07 NOTE — Progress Notes (Signed)
[address]       [place] [address]             [phone]     CARDIOTHORACIC SURGERY OFFICE NOTE  Referring Provider is Croitoru, Dani Gobble, MD PCP is Leonard Downing, MD   HPI:  Patient returns to the office today for routine follow-up approximately 3 months status post minimally invasive mitral valve repair on 03/06/2016 for mitral valve prolapse with stage D severe symptomatic primary mitral regurgitation.  Her postoperative recovery has been uncomplicated. She was last seen here in our office on 04/22/2016 at which time she was doing very well. Since then she enrolled and completed the outpatient cardiac rehabilitation program. She states that she progressed so rapidly that they gave her a certificate and told her she had met her goals.The patient reports that she feels exceptionally well Her exercise tolerance is very good. She denies any symptoms of exertional shortness of breath. She feels much better than she did prior to surgery.  She no longer has any soreness in her chest.  She is delighted with her outcome.  Current Outpatient Prescriptions  Medication Sig Dispense Refill  . ALPRAZolam (XANAX) 0.5 MG tablet Take 0.5 mg by mouth 3 (three) times daily as needed for anxiety.     Marland Kitchen aspirin EC 81 MG EC tablet Take 1 tablet (81 mg total) by mouth daily.    Marland Kitchen warfarin (COUMADIN) 5 MG tablet Take 1.5 to 2 tablets by mouth daily as directed by coumadin clinic 60 tablet 2  . [DISCONTINUED] metoprolol tartrate (LOPRESSOR) 25 MG tablet Take 0.5 tablets (12.5 mg total) by mouth 2 (two) times daily. 60 tablet 1   No current facility-administered medications for this visit.      Physical Exam:   BP 137/97 mmHg  Pulse 90  Resp 16  Ht '5\' 6"'$  (1.676 m)  Wt 143 lb (64.864 kg)  BMI 23.09 kg/m2  SpO2 98%  General:  Well-appearing  Chest:   clear  CV:   regular rate and rhythm without murmur  Incisions:  completely healed  Abdomen:  Soft and  nontender  Extremities:  Warm and well perfused  Diagnostic Tests:  n/a   Impression:  Patient is doing very well approximately 3 months status post minimally invasive mitral valve repair.  Plan:  I have instructed the patient to stop taking Coumadin but continue taking aspirin 81 mg daily.  She may resume unrestricted physical activity. She reportedly has a follow-up appointment in the near future with Dr. Sallyanne Kuster.  I would favor checking a routine follow-up echocardiogram at some point in the near future to reestablish a new baseline and reassess left ventricular ejection fraction.  The patient has been reminded regarding the importance of dental hygiene and the lifelong need for antibiotic prophylaxis for all dental cleanings and other related invasive procedures.  She will continue to follow-up with Dr. Sallyanne Kuster and return to see [place] here in this office next March, approximately 1 year following her surgery for routine follow-up.She will call during the interim should specific problems or difficulties arise.   Valentina Gu. Roxy Manns, MD 06/07/2016 10:42 AM

## 2016-06-07 NOTE — Patient Instructions (Signed)
Stop taking Coumadin and call the Coumadin clinic to let them know  Continue taking aspirin 81 mg/day  You may resume unrestricted physical activity without any particular limitations at this time.  Endocarditis is a potentially serious infection of heart valves or inside lining of the heart.  It occurs more commonly in patients with diseased heart valves (such as patient's with aortic or mitral valve disease) and in patients who have undergone heart valve repair or replacement.  Certain surgical and dental procedures may put you at risk, such as dental cleaning, other dental procedures, or any surgery involving the respiratory, urinary, gastrointestinal tract, gallbladder or prostate gland.   To minimize your chances for develooping endocarditis, maintain good oral health and seek prompt medical attention for any infections involving the mouth, teeth, gums, skin or urinary tract.    Always notify your doctor or dentist about your underlying heart valve condition before having any invasive procedures. You will need to take antibiotics before certain procedures, including all routine dental cleanings or other dental procedures.  Your cardiologist or dentist should prescribe these antibiotics for you to be taken ahead of time.

## 2016-06-10 ENCOUNTER — Ambulatory Visit (HOSPITAL_COMMUNITY): Payer: BC Managed Care – PPO

## 2016-06-12 ENCOUNTER — Ambulatory Visit (INDEPENDENT_AMBULATORY_CARE_PROVIDER_SITE_OTHER): Payer: BC Managed Care – PPO | Admitting: Cardiovascular Disease

## 2016-06-12 ENCOUNTER — Encounter: Payer: Self-pay | Admitting: Cardiovascular Disease

## 2016-06-12 ENCOUNTER — Ambulatory Visit (HOSPITAL_COMMUNITY): Payer: BC Managed Care – PPO

## 2016-06-12 VITALS — BP 110/88 | HR 90 | Ht 66.0 in | Wt 143.6 lb

## 2016-06-12 DIAGNOSIS — Z9889 Other specified postprocedural states: Secondary | ICD-10-CM

## 2016-06-12 DIAGNOSIS — I340 Nonrheumatic mitral (valve) insufficiency: Secondary | ICD-10-CM

## 2016-06-12 NOTE — Patient Instructions (Signed)
Medication Instructions: Dr Croitoru recommends that you continue on your current medications as directed. Please refer to the Current Medication list given to you today.  Labwork: NONE ORDERED  Testing/Procedures: 1. Echocardiogram - Your physician has requested that you have an echocardiogram. Echocardiography is a painless test that uses sound waves to create images of your heart. It provides your doctor with information about the size and shape of your heart and how well your heart's chambers and valves are working. This procedure takes approximately one hour. There are no restrictions for this procedure. This will be done at our [address] location - [address], [address].  Follow-up: Dr Croitoru recommends that you schedule a follow-up appointment in 1 year. You will receive a reminder letter in the mail two months in advance. If you don't receive a letter, please call our office to schedule the follow-up appointment.  If you need a refill on your cardiac medications before your next appointment, please call your pharmacy. 

## 2016-06-12 NOTE — Progress Notes (Signed)
Cardiology Office Note    Date:  06/12/2016   ID:  Becky Murphy, DOB 1951-05-29, MRN [id number]  PCP:  Kaleen MaskELKINS,WILSON OLIVER, MD  Cardiologist:   Thurmon FairMihai Jaelan Rasheed, MD   Chief Complaint  Patient presents with  . Follow-up    History of Present Illness:  Becky Murphy is a 65 y.o. female returning for cardiology follow-up roughly 3 months after undergoing mitral valve repair and clipping of the left atrial appendage by Dr. Cornelius Moraswen.  She had a fairly complex valvuloplasty that included partial resection of the posterior leaflet, artificial neo-cord placements and a 34 mm annuloplasty ring. She has recovered very well from the procedure, has completed cardiac rehabilitation and has returned to work. She has decided to retire at the end of the summer. The oppressive chest discomfort that she experienced at her initial presentation has resolved following mitral valve repair. Her exertional capacity has improved.  He denies leg edema, palpitations, syncope, chest pain at rest or with exertion, dyspnea with exertion, orthopnea, PND and other cardiac complaints. She was anticoagulated for several months following the procedure but is no longer taking anticoagulation now. She is only on aspirin.  She had normal coronary arteries at angiography in January 2017.  Past Medical History  Diagnosis Date  . Anxiety   . Depression   . History of peptic ulcer "late 1970's"  . Arthritis     "back?" (11/03/2015)  . Migraine     "maybe 3-4/yr now" (11/03/2015)  . Mitral valve prolapse 12/26/2015    symptomatic"MVP surgery postponed to get lesion of pancreas evaluated first".  . 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 Chest pain 11/02/2015  . Pancreatic mass 01/29/2016    2.8 cm enhancing mass in the pancreatic neck and 3.1 cm low-attenuation cystic lesion in body of pancreas noted on CT angiogram  . Thyroid nodule 01/29/2016    2.4 cm enhancing mixed cystic and solid nodule inferior left  thyroid gland noted on CT angiogram  . PONV (postoperative nausea and vomiting)   . Heart murmur   . Exertional dyspnea 01/05/2016  . S/P minimally invasive mitral valve repair 03/06/2016    Complex valvuloplasty including triangular resection of posterior leaflet, artificial Gore-tex neochord placement x6 and 34 mm Memo 3D ring annuloplasty via right mini thoracotomy approach with clipping of LA appendage    Past Surgical History  Procedure Laterality Date  . Back surgery    . Lumbar disc surgery  1992    "trimmed bulges off both sides"  . Tubal ligation    . Tee without cardioversion N/A 01/05/2016    Procedure: TRANSESOPHAGEAL ECHOCARDIOGRAM (TEE);  Surgeon: Thurmon FairMihai Xadrian Craighead, MD;  [facility];  Service: Cardiovascular;  Laterality: N/A;  . Cardiac catheterization N/A 01/16/2016    Procedure: Right/Left Heart Cath and Coronary Angiography;  Surgeon: Thurmon FairMihai Luqman Perrelli, MD;  Location: [facility] INVASIVE CV LAB;  Service: Cardiovascular;  Laterality: N/A;  . Eus N/A 02/20/2016    Procedure: ESOPHAGEAL ENDOSCOPIC ULTRASOUND (EUS) RADIAL;  Surgeon: Willis ModenaWilliam Outlaw, MD;  Location: [facility] ENDOSCOPY;  Service: Endoscopy;  Laterality: N/A;  . Breast biopsy Left ~ 2005  . Breast lumpectomy Left ~ 2005  . Eye muscle surgery Bilateral 1970's?  . Colonoscopy    . Mitral valve repair Right 03/06/2016    Procedure: MINIMALLY INVASIVE MITRAL VALVE REPAIR (MVR) using a 34 Sorin Memo 3D Ring;  Surgeon: Purcell Nailslarence H Owen, MD;  Location: [facility] OR;  Service: Open Heart Surgery;  Laterality: Right;  .  Tee without cardioversion N/A 03/06/2016    Procedure: TRANSESOPHAGEAL ECHOCARDIOGRAM (TEE);  Surgeon: Purcell Nails, MD;  Location[facility];  Service: Open Heart Surgery;  Laterality: N/A;  . Clipping of atrial appendage Left 03/06/2016    Procedure: CLIPPING OF LEFT ATRIAL APPENDAGE using a 45 PRO2 AtriClip;  Surgeon: Purcell Nails, MD;  Location: [facility] OR;  Service: Open Heart Surgery;  Laterality: Left;    Current  Medications: Outpatient Prescriptions Prior to Visit  Medication Sig Dispense Refill  . ALPRAZolam (XANAX) 0.5 MG tablet Take 0.5 mg by mouth 3 (three) times daily as needed for anxiety.     Marland Kitchen aspirin EC 81 MG EC tablet Take 1 tablet (81 mg total) by mouth daily.     No facility-administered medications prior to visit.     Allergies:   Review of patient's allergies indicates no known allergies.   Social History   Social History  . Marital Status: Married    Spouse Name: N/A  . Number of Children: N/A  . Years of Education: N/A   Social History Main Topics  . Smoking status: Never Smoker   . Smokeless tobacco: Never Used  . Alcohol Use: 6.0 oz/week    6 Glasses of wine, 4 Shots of liquor per week     Comment: occasional  . Drug Use: No  . Sexual Activity: Not Currently   Other Topics Concern  . None   Social History Narrative     Family History:  The patient's family history includes Cancer in her father; Cancer (age of onset: 62) in her sister; Cancer (age of onset: 42) in her mother; Hypertension in her mother.   ROS:   Please see the history of present illness.    ROS All other systems reviewed and are negative.   PHYSICAL EXAM:   VS:  BP 110/88 mmHg  Pulse 90  Ht 5\' 6"  (1.676 m)  Wt 65.137 kg (143 lb 9.6 oz)  BMI 23.19 kg/m2  SpO2 98%   GEN: Well nourished, well developed, in no acute distress HEENT: normal Neck: no JVD, carotid bruits, or masses Cardiac: RRR; no murmurs, rubs, or gallops,no edema  Respiratory:  clear to auscultation bilaterally, normal work of breathing GI: soft, nontender, nondistended, + BS MS: no deformity or atrophy Skin: warm and dry, no rash Neuro:  Alert and Oriented x 3, Strength and sensation are intact Psych: euthymic mood, full affect  Wt Readings from Last 3 Encounters:  06/12/16 65.137 kg (143 lb 9.6 oz)  06/07/16 64.864 kg (143 lb)  06/05/16 65.3 kg (143 lb 15.4 oz)      Studies/Labs Reviewed:   EKG:  EKG is not  ordered today.    Recent Labs: 03/04/2016: ALT 12* 03/07/2016: Magnesium 2.2 03/09/2016: Hemoglobin 11.1*; Platelets 135* 03/11/2016: B Natriuretic Peptide 271.4* 03/12/2016: BUN 9; Creatinine, Ser 0.58; Potassium 3.5; Sodium 140     ASSESSMENT:    1. 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 Mitral insufficiency   3. S/P minimally invasive mitral valve repair      PLAN:  In order of problems listed above:   [place] has done very well following her mitral valve repair. She appears to be completely asymptomatic and has excellent exercise capacity. She is no longer taking anticoagulation. The left atrial appendage clipping was performed and she has completed 3 months of anticoagulation following her valvuloplasty. She is reminded about the need for endocarditis prophylaxis with certain dental procedures and other invasive procedures  with risk of bacteremia. Will check an echocardiogram to see what the transmitral gradients are following the repair. Barring any surprises on the echocardiogram will plan for yearly follow-up.   Medication Adjustments/Labs and Tests Ordered: Current medicines are reviewed at length with the patient today.  Concerns regarding medicines are outlined above.  Medication changes, Labs and Tests ordered today are listed in the Patient Instructions below. There are no Patient Instructions on file for this visit.   Signed, Thurmon Fair, MD  06/12/2016 9:13 AM    [facility] [address] [place], [place], [place]  [address] Phone: [phone]; Fax: [phone]

## 2016-06-14 ENCOUNTER — Ambulatory Visit (HOSPITAL_COMMUNITY): Payer: BC Managed Care – PPO

## 2016-06-17 ENCOUNTER — Ambulatory Visit (HOSPITAL_COMMUNITY): Payer: BC Managed Care – PPO

## 2016-06-19 ENCOUNTER — Ambulatory Visit (HOSPITAL_COMMUNITY): Payer: BC Managed Care – PPO

## 2016-06-21 ENCOUNTER — Ambulatory Visit (HOSPITAL_COMMUNITY): Payer: BC Managed Care – PPO

## 2016-06-24 ENCOUNTER — Ambulatory Visit (HOSPITAL_COMMUNITY): Payer: BC Managed Care – PPO

## 2016-06-26 ENCOUNTER — Ambulatory Visit (HOSPITAL_COMMUNITY): Payer: BC Managed Care – PPO

## 2016-06-28 ENCOUNTER — Ambulatory Visit (HOSPITAL_COMMUNITY): Payer: BC Managed Care – PPO

## 2016-07-01 ENCOUNTER — Ambulatory Visit (HOSPITAL_COMMUNITY): Payer: BC Managed Care – PPO

## 2016-07-01 NOTE — Addendum Note (Signed)
Encounter addended by: Cammy CopaMaria W Sparkle Aube, RN on: 07/01/2016  2:26 PM<BR>     Documentation filed: Episodes, Clinical Notes

## 2016-07-03 ENCOUNTER — Ambulatory Visit (HOSPITAL_COMMUNITY): Payer: BC Managed Care – PPO

## 2016-07-05 ENCOUNTER — Ambulatory Visit (HOSPITAL_COMMUNITY): Payer: BC Managed Care – PPO

## 2016-07-05 ENCOUNTER — Ambulatory Visit (HOSPITAL_COMMUNITY): Payer: BC Managed Care – PPO | Attending: Cardiovascular Disease

## 2016-07-05 ENCOUNTER — Other Ambulatory Visit: Payer: Self-pay

## 2016-07-05 DIAGNOSIS — I517 Cardiomegaly: Secondary | ICD-10-CM | POA: Diagnosis not present

## 2016-07-05 DIAGNOSIS — I340 Nonrheumatic mitral (valve) insufficiency: Secondary | ICD-10-CM | POA: Diagnosis not present

## 2016-07-08 ENCOUNTER — Ambulatory Visit (HOSPITAL_COMMUNITY): Payer: BC Managed Care – PPO

## 2016-07-08 NOTE — Addendum Note (Signed)
Encounter addended by: Jacques EarthlyEdna Brewbaker Tuleen Mandelbaum, RD on: 07/08/2016  8:11 AM<BR>     Documentation filed: Normajean GlasgowFlowsheet VN

## 2016-07-09 ENCOUNTER — Telehealth: Payer: Self-pay | Admitting: Cardiovascular Disease

## 2016-07-09 NOTE — Telephone Encounter (Signed)
No answer

## 2016-07-09 NOTE — Telephone Encounter (Signed)
New message   Pt verbalized that she is returning call for rn   Please give pt a call

## 2016-07-09 NOTE — Telephone Encounter (Signed)
Tried calling patient back at number listed Unable to leave message, no answer

## 2016-07-09 NOTE — Telephone Encounter (Signed)
Notes Recorded by Marzella SchleinAngela M Waylan Busta, CMA on 07/09/2016 at 3:08 PM Returned call. Patient not available, spoke with husband, [name] (DPR). Reviewed results. Husband verbalized understanding. Will have patient call back if she has any questions.

## 2016-07-10 ENCOUNTER — Ambulatory Visit (HOSPITAL_COMMUNITY): Payer: BC Managed Care – PPO

## 2016-07-12 ENCOUNTER — Ambulatory Visit (HOSPITAL_COMMUNITY): Payer: BC Managed Care – PPO

## 2016-07-15 ENCOUNTER — Ambulatory Visit (HOSPITAL_COMMUNITY): Payer: BC Managed Care – PPO

## 2016-07-17 ENCOUNTER — Ambulatory Visit (HOSPITAL_COMMUNITY): Payer: BC Managed Care – PPO

## 2016-07-19 ENCOUNTER — Ambulatory Visit (HOSPITAL_COMMUNITY): Payer: BC Managed Care – PPO

## 2016-07-22 ENCOUNTER — Ambulatory Visit (HOSPITAL_COMMUNITY): Payer: BC Managed Care – PPO

## 2016-07-24 ENCOUNTER — Ambulatory Visit (HOSPITAL_COMMUNITY): Payer: BC Managed Care – PPO

## 2016-07-26 ENCOUNTER — Ambulatory Visit (HOSPITAL_COMMUNITY): Payer: BC Managed Care – PPO

## 2016-07-29 ENCOUNTER — Ambulatory Visit (HOSPITAL_COMMUNITY): Payer: BC Managed Care – PPO

## 2016-07-31 ENCOUNTER — Ambulatory Visit (HOSPITAL_COMMUNITY): Payer: BC Managed Care – PPO

## 2016-08-02 ENCOUNTER — Ambulatory Visit (HOSPITAL_COMMUNITY): Payer: BC Managed Care – PPO

## 2016-08-05 ENCOUNTER — Ambulatory Visit (HOSPITAL_COMMUNITY): Payer: BC Managed Care – PPO

## 2016-08-07 ENCOUNTER — Ambulatory Visit (HOSPITAL_COMMUNITY): Payer: BC Managed Care – PPO

## 2016-08-09 ENCOUNTER — Ambulatory Visit (HOSPITAL_COMMUNITY): Payer: BC Managed Care – PPO

## 2016-08-12 ENCOUNTER — Ambulatory Visit (HOSPITAL_COMMUNITY): Payer: BC Managed Care – PPO

## 2016-08-14 ENCOUNTER — Ambulatory Visit (HOSPITAL_COMMUNITY): Payer: BC Managed Care – PPO

## 2016-08-16 ENCOUNTER — Ambulatory Visit (HOSPITAL_COMMUNITY): Payer: BC Managed Care – PPO

## 2016-08-19 ENCOUNTER — Ambulatory Visit (HOSPITAL_COMMUNITY): Payer: BC Managed Care – PPO

## 2016-08-21 ENCOUNTER — Ambulatory Visit (HOSPITAL_COMMUNITY): Payer: BC Managed Care – PPO

## 2016-08-23 ENCOUNTER — Ambulatory Visit (HOSPITAL_COMMUNITY): Payer: BC Managed Care – PPO

## 2016-08-26 ENCOUNTER — Ambulatory Visit (HOSPITAL_COMMUNITY): Payer: BC Managed Care – PPO

## 2016-08-28 ENCOUNTER — Ambulatory Visit (HOSPITAL_COMMUNITY): Payer: BC Managed Care – PPO

## 2016-08-30 ENCOUNTER — Ambulatory Visit (HOSPITAL_COMMUNITY): Payer: BC Managed Care – PPO

## 2016-09-04 ENCOUNTER — Ambulatory Visit (HOSPITAL_COMMUNITY): Payer: BC Managed Care – PPO

## 2016-09-06 ENCOUNTER — Ambulatory Visit (HOSPITAL_COMMUNITY): Payer: BC Managed Care – PPO

## 2016-10-29 ENCOUNTER — Ambulatory Visit: Payer: Self-pay | Admitting: Pharmacist Clinician (PhC)/ Clinical Pharmacy Specialist

## 2016-10-29 DIAGNOSIS — Z9889 Other specified postprocedural states: Secondary | ICD-10-CM

## 2017-03-10 ENCOUNTER — Encounter: Payer: BC Managed Care – PPO | Admitting: Thoracic Surgery (Cardiothoracic Vascular Surgery)

## 2017-07-16 ENCOUNTER — Other Ambulatory Visit: Payer: Self-pay | Admitting: Gastroenterology

## 2017-07-16 DIAGNOSIS — R932 Abnormal findings on diagnostic imaging of liver and biliary tract: Secondary | ICD-10-CM

## 2017-07-29 ENCOUNTER — Ambulatory Visit
Admission: RE | Admit: 2017-07-29 | Discharge: 2017-07-29 | Disposition: A | Discharge status: Home / Self Care | Payer: BC Managed Care – PPO | Source: Ambulatory Visit | Attending: Gastroenterology | Admitting: Gastroenterology

## 2017-07-29 DIAGNOSIS — R932 Abnormal findings on diagnostic imaging of liver and biliary tract: Secondary | ICD-10-CM

## 2017-07-29 MED ORDER — GADOBENATE DIMEGLUMINE 529 MG/ML IV SOLN
15.0000 mL | Freq: Once | INTRAVENOUS | Status: AC | PRN
  Administered 2017-07-29: 15 mL via INTRAVENOUS

## 2017-11-08 IMAGING — CT CT ANGIO CHEST
1 of 7 series · 13 of 31 positions shown · IV contrast (75CC ISOVUE 370)
Comparison: None.

CLINICAL DATA: 64-year-old female undergoing preoperative
evaluation prior to mitral valve repair. Additional past medical
history includes aortoiliac occlusive disease and thoracic aortic
aneurysm.

EXAM:
CT ANGIOGRAPHY CHEST, ABDOMEN AND PELVIS
TECHNIQUE: Multidetector CT imaging through the chest, abdomen and pelvis was
performed using the standard protocol during bolus administration of
intravenous contrast. Multiplanar reconstructed images and MIPs were
obtained and reviewed to evaluate the vascular anatomy.
CONTRAST:  75 mL Isovue 370 administered intravenously

[Series 4: angio · axial · 0.74mm/px · z∈[-546,-26]mm · 13 of 244 slices shown]
[im 18/244  lung]
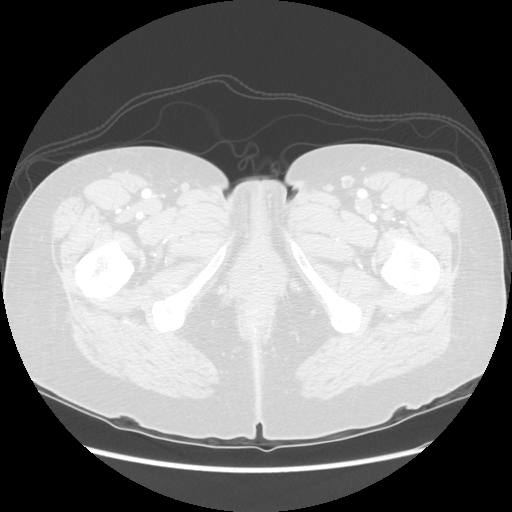
[im 35/244  mediastinal]
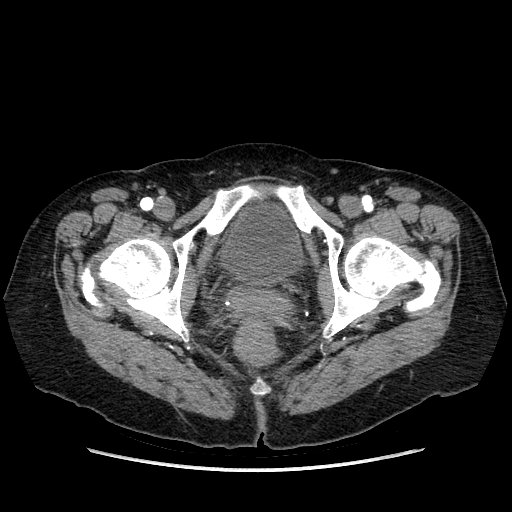
[im 53/244  lung]
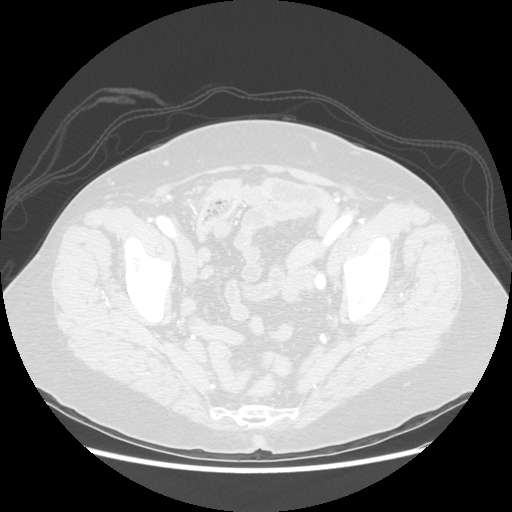
[im 70/244  mediastinal]
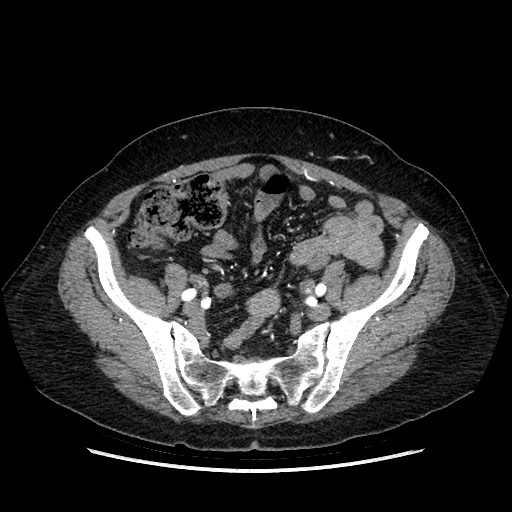
[im 87/244  lung]
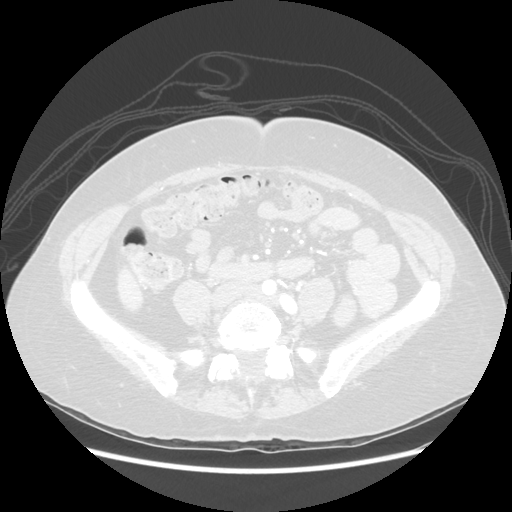
[im 105/244  mediastinal]
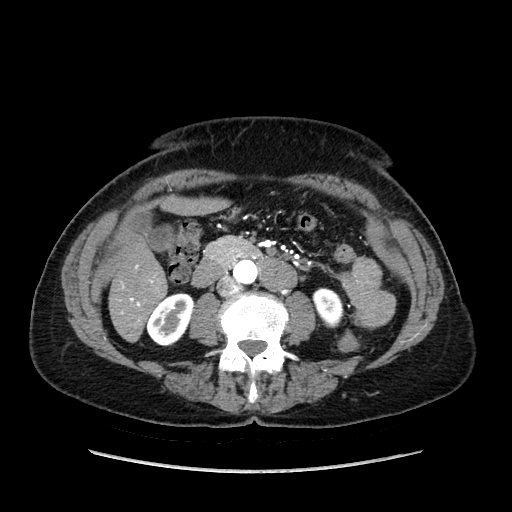
[im 122/244  lung]
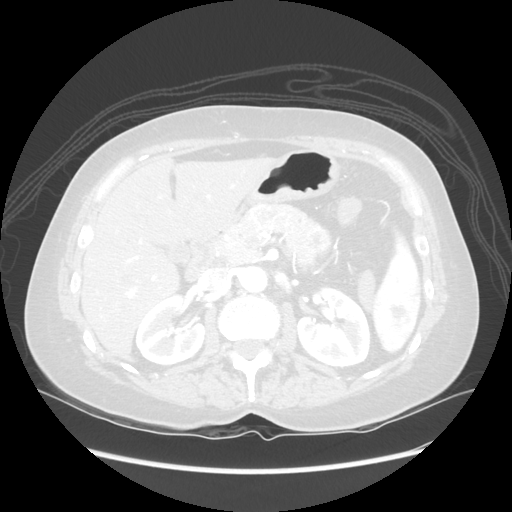
[im 139/244  mediastinal]
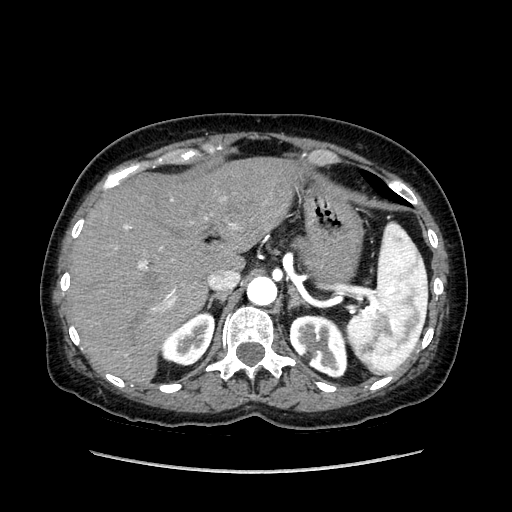
[im 157/244  lung]
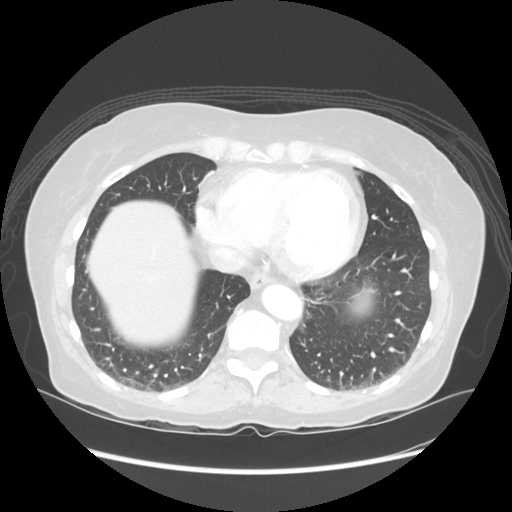
[im 174/244  mediastinal]
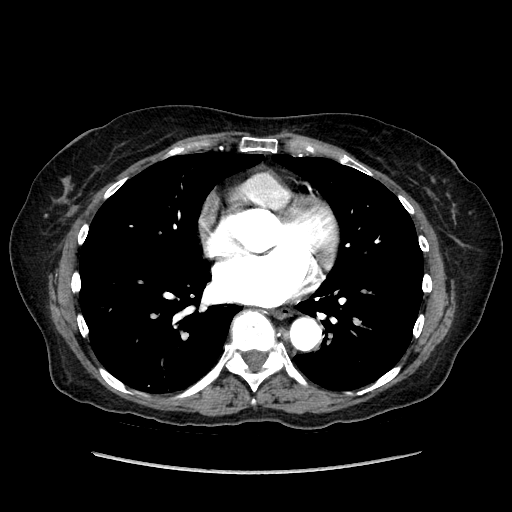
[im 191/244  lung]
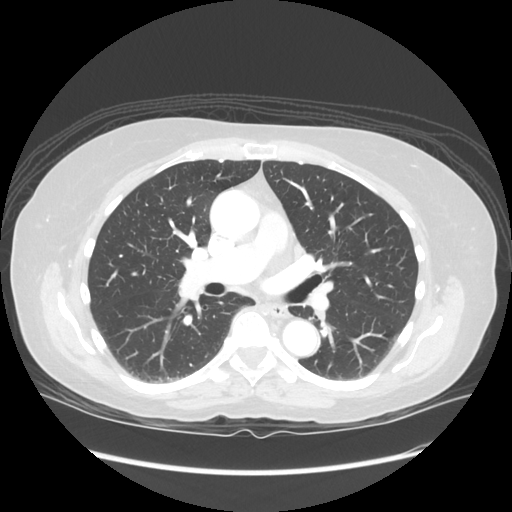
[im 209/244  mediastinal]
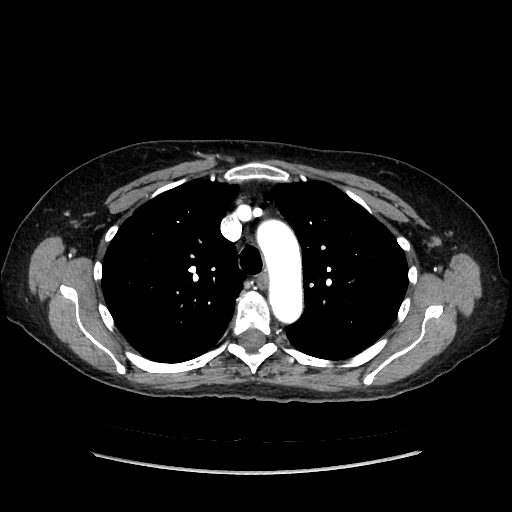
[im 226/244  lung]
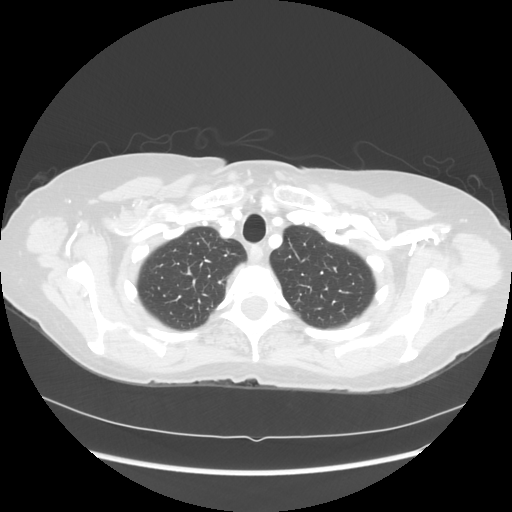

[13 of 31 positions shown; findings below may reference images not displayed]

FINDINGS: CTA CHEST FINDINGS

Mediastinum: Probable 2.4 cm enhancing mixed cystic and solid nodule
arising from the inferior aspect of the left thyroid gland. The
remainder of the thoracic inlet is unremarkable. No suspicious
mediastinal adenopathy or mass. Unremarkable thoracic esophagus.

Heart/Vascular: Elongated thoracic aortic infundibulum and
descending thoracic aorta. Conventional 3 vessel arch anatomy. The
ascending thoracic aorta is ectatic at 3.6 cm in diameter but not
truly aneurysmal. No evidence of aortic dissection. The majority of
the heart is within normal limits in size. There is dilatation of
the left atrium and left atrial appendage. No evidence of left
atrial appendage thrombus. No definite coronary artery
calcifications within the limitations of this non cardiac gated
examination. No pericardial fluid.

Lungs/Pleura: 2 mm subpleural nodule associated with the right major
fissure (image 21 series 5). This has punctate attenuation centrally
and is likely a small partially calcified granuloma. No further
imaging follow-up is required. Otherwise, the lungs are clear. No
pleural effusion.

Bones/Soft Tissues: No acute fracture or aggressive appearing lytic
or blastic osseous lesion.

Review of the MIP images confirms the above findings.

CTA ABDOMEN AND PELVIS FINDINGS

VASCULAR

Aorta: Normal caliber abdominal aorta with trace calcified
atherosclerotic plaque. No irregular fibro fatty pack or wall
adherent mural thrombus.

Celiac: Widely patent and unremarkable. Conventional hepatic
arterial anatomy. No evidence of visceral artery aneurysm.

SMA: Widely patent and unremarkable.

Renals: 2 renal arteries are present bilaterally. No evidence of
significant stenosis or fibromuscular dysplasia.

IMA: Patent and unremarkable.

Inflow: Mildly tortuous iliac systems. The vessels are normal in
caliber measuring 7-8 mm in diameter bilaterally. No evidence of
dissection, aneurysm or stenosis.

Proximal Outflow: Within normal limits.

Veins: No focal venous abnormality.

NON-VASCULAR

Abdomen: Normal CT appearance of the stomach, duodenum and adrenal
glands. Numerous macro lobulated low-attenuation lesions are present
throughout the spleen with the largest measuring 3.2 cm in the
medial aspect. Additionally, there is a circumscribed 3.1 cm
low-attenuation cystic lesion arising from the body of the pancreas.
2.8 cm arterially enhancing rounded mass arising from the pancreatic
neck. There is washout on the delayed phase imaging.

2.8 cm simple cyst in the central aspect of the right liver and
cm simple cyst in hepatic segment 3. Gallstones layer within the
gallbladder. No evidence of gallbladder wall thickening or
pericholecystic fluid. No intra or extrahepatic biliary ductal
dilatation.

The 5 mm nonobstructing stone in the interpolar right kidney. No
enhancing renal mass. 7 mm fat attenuation lesion at the
posteromedial aspect of the left upper pole likely represents a
small angiomyolipoma. No evidence of hydronephrosis or enhancing
renal mass. Tiny sub cm low-attenuation lesions in the right kidney
are too small to characterize but statistically highly likely benign
cysts.

No evidence of obstruction or focal bowel wall thickening. Normal
appendix in the right lower quadrant. The terminal ileum is
unremarkable. No free fluid or suspicious adenopathy.

Pelvis: Unremarkable appearance of the at adnexa, uterus and
bladder. No free fluid or suspicious adenopathy.

Bones/Soft Tissues: No acute fracture or aggressive appearing lytic
or blastic osseous lesion. Small fat containing periumbilical
hernia. L4-L5 and L5-S1 degenerative disc disease.

Review of the MIP images confirms the above findings.
IMPRESSION: VASCULAR

1. Minimal atherosclerotic plaque and vascular tortuosity without
evidence of aneurysm, dissection or hemodynamically significant
stenosis.
2. Tortuous thoracic and abdominal aorta. Mild ectasia of the
ascending thoracic aorta to a maximal diameter of 3.6 cm.
3. Dilated left atrium and left atrial appendage consistent with the
clinical history of mitral valve regurgitation.
NON VASCULAR

1. 2.8 cm arterially enhancing mass within the pancreatic neck. The
mass demonstrates washout on delayed phase imaging. Differential
considerations include pancreatic neuroendocrine tumor, pancreatic
adenocarcinoma, solitary papillary epithelial neoplasm of the
pancreas, and other primary pancreatic neoplastic processes.
Recommend further evaluation with dedicated pancreas protocol MRI
and/or endoscopic ultrasound with biopsy.
2. A separate 3.1 cm circumscribed low-attenuation cystic lesion
arises from the body of the pancreas. This may represent a
pancreatic cyst, benign cystic neoplasm, branch duct IPMN or
residual pseudocyst if the patient has a clinical history of prior
pancreatitis.
3. Hepatic and renal simple cysts.
4. Low-attenuation macro lobular lesions throughout the spleen may
represent mildly complex cysts, hemangiomas, or other benign
entities. Malignant metastatic disease to the spleen is extremely
unlikely in the absence of widespread metastatic disease. The
structures could be further evaluated at the time of pancreas
protocol MRI as recommended above.
5. 2.4 cm enhancing predominantly solid nodule arising from the
inferior aspect of the left thyroid gland. Recommend further
evaluation with dedicated thyroid ultrasound.
6. Nonobstructing right nephrolithiasis.
7. Small fat containing periumbilical hernia.
8. Lower lumbar degenerative disc disease.
These results will be called to the ordering clinician or
representative by the Radiologist Assistant, and communication
documented in the PACS or zVision Dashboard.

## 2020-03-23 ENCOUNTER — Ambulatory Visit (INDEPENDENT_AMBULATORY_CARE_PROVIDER_SITE_OTHER): Payer: Medicare Other

## 2020-03-23 ENCOUNTER — Other Ambulatory Visit: Payer: Self-pay

## 2020-03-23 ENCOUNTER — Ambulatory Visit (INDEPENDENT_AMBULATORY_CARE_PROVIDER_SITE_OTHER): Payer: Medicare Other | Admitting: Specialist

## 2020-03-23 ENCOUNTER — Encounter: Payer: Self-pay | Admitting: Specialist

## 2020-03-23 VITALS — BP 112/78 | HR 76 | Ht 66.0 in | Wt 154.0 lb

## 2020-03-23 DIAGNOSIS — M25561 Pain in right knee: Secondary | ICD-10-CM

## 2020-03-23 DIAGNOSIS — M25461 Effusion, right knee: Secondary | ICD-10-CM | POA: Diagnosis not present

## 2020-03-23 DIAGNOSIS — S83104D Unspecified dislocation of right knee, subsequent encounter: Secondary | ICD-10-CM

## 2020-03-23 MED ORDER — BUPIVACAINE HCL 0.5 % IJ SOLN
3.0000 mL | INTRAMUSCULAR | Status: AC | PRN
  Administered 2020-03-23: 11:00:00 3 mL via INTRA_ARTICULAR

## 2020-03-23 MED ORDER — OXYCODONE HCL 5 MG PO CAPS: 5.0000 mg | ORAL_CAPSULE | Freq: Four times a day (QID) | ORAL | 0 refills | Status: DC | PRN

## 2020-03-23 MED ORDER — IBUPROFEN 800 MG PO TABS: 800.0000 mg | ORAL_TABLET | Freq: Three times a day (TID) | ORAL | 0 refills | Status: DC | PRN

## 2020-03-23 NOTE — Patient Instructions (Addendum)
Gram stain  And cell count right knee synovial fluid.

## 2020-03-23 NOTE — Progress Notes (Signed)
Office Visit Note   Patient: Becky Murphy           Date of Birth: May 17, 1951           MRN: [id number] Visit Date: 03/23/2020              Requested by: Leonard Downing, MD [place],  [place] [id number] PCP: Leonard Downing, MD   Assessment & Plan: Visit Diagnoses:  1. Right knee pain, unspecified chronicity   2. Effusion, right knee   3. Acute traumatic internal derangement of right knee, subsequent encounter     Plan: Gram stain  And cell count right knee synovial fluid.  Follow-Up Instructions: Return in about 1 week (around 03/30/2020).   Orders:  Orders Placed This Encounter  Procedures  . Large Joint Inj: R knee  . Body Fluid Culture  . Body Fluid Culture  . XR Knee 1-2 Views Right  . MR Knee Right w/o contrast  . Sed Rate (ESR)  . C-reactive protein  . Uric acid  . CBC with Differential/Platelet   Meds ordered this encounter  Medications  . oxycodone (OXY-IR) 5 MG capsule    Sig: Take 1 capsule (5 mg total) by mouth every 6 (six) hours as needed.    Dispense:  30 capsule    Refill:  0  . ibuprofen (ADVIL) 800 MG tablet    Sig: Take 1 tablet (800 mg total) by mouth every 8 (eight) hours as needed.    Dispense:  30 tablet    Refill:  0      Procedures: Large Joint Inj: R knee on 03/23/2020 10:40 AM Indications: pain Details: 25 G 1.5 in needle, anteromedial approach  Arthrogram: No  Medications: 3 mL bupivacaine 0.5 % Aspirate: cloudy and serous Outcome: tolerated well, no immediate complications  4-5 cc cloudy thin serous fluid, not purulent. Procedure, treatment alternatives, risks and benefits explained, specific risks discussed. Consent was given by the patient. Immediately prior to procedure a time out was called to verify the correct patient, procedure, equipment, support staff and site/side marked as required. Patient was prepped and draped in the usual sterile fashion.       Clinical Data: No additional  findings.   Subjective: Chief Complaint  Patient presents with  . Right Knee - Pain, Injury    Twisted knee getting out of truck bed in January, Dr. Welton Flakes aspirated and injected w/o relief, OTC meds and ice not helping, tramadol made her nauseated    69 year old female with one month history of right knee pain and swelling that started immediately after she was helping her husband  To move his father's belonging due to recent death and she was trying to get out of the tailgate of a truck and had swung her right knee over and leg over to feel the bumper with the right leg and injured the right knee. I has been having swelling occur over the weekend that it occurred. Ibuprofen seemed to help and she has been icing it, there was no open injury and no abrasions, no fall. Ibuprofen with some help, but right now nothing helps. Saw Dr. Claris Gower at [facility]. No fever of chills, she and her husband have been isolating and  Her husband's father passed away with history of stroke and had a condition of his blood. She is able to stand with holding onto something. She has a difficulty time getting in and out of the  bathroom. Hurts to bend the right knee and to straighten it, it pulls severely. She hurt the leg in January, then at the end of February it was becoming bad, Saw Dr. Arelia Sneddon 03/02/2020 and has had fluid removed and steroid injection with out relief. Injection seem to make it worse. She has had knots show up in the wrists and forearms following the steroid injection. No history of RA or other inflamatory condition. No recent infections. Unable to actively extend the right knee.    Review of Systems  Constitutional: Positive for unexpected weight change (Since heart surgery, gained weight, some spots on kidney, liver and pancreas, told these are cysts, told they are stable. Marland Kitchen). Negative for activity change, appetite change, chills, diaphoresis, fatigue and fever.  HENT: Negative.  Negative for  congestion, dental problem, drooling, ear discharge, ear pain, facial swelling, hearing loss, mouth sores, nosebleeds, postnasal drip, rhinorrhea, sinus pressure, sinus pain, sneezing, sore throat, tinnitus, trouble swallowing and voice change.   Eyes: Positive for visual disturbance (uses contacts). Negative for photophobia, pain, discharge, redness and itching.  Respiratory: Positive for shortness of breath. Negative for apnea, cough, choking, chest tightness, wheezing and stridor.   Cardiovascular: Negative.  Negative for chest pain, palpitations and leg swelling.  Gastrointestinal: Negative.  Negative for abdominal distention, abdominal pain, anal bleeding, blood in stool, constipation, diarrhea, nausea, rectal pain and vomiting.  Endocrine: Negative.  Negative for cold intolerance, heat intolerance, polydipsia, polyphagia and polyuria.  Genitourinary: Negative.  Negative for difficulty urinating, dyspareunia, dysuria, enuresis, flank pain, frequency, genital sores, hematuria and urgency.  Musculoskeletal: Positive for gait problem and joint swelling. Negative for arthralgias, back pain, myalgias, neck pain and neck stiffness.  Skin: Negative.  Negative for color change, pallor, rash and wound.  Allergic/Immunologic: Negative.  Negative for environmental allergies and food allergies.  Neurological: Positive for weakness. Negative for dizziness, tremors, seizures, syncope, facial asymmetry, light-headedness, numbness and headaches.  Hematological: Negative.  Negative for adenopathy. Does not bruise/bleed easily.  Psychiatric/Behavioral: Negative for agitation, behavioral problems, confusion, decreased concentration, dysphoric mood, hallucinations, self-injury, sleep disturbance and suicidal ideas. The patient is not nervous/anxious and is not hyperactive.      Objective: Vital Signs: BP 112/78 (BP Location: Left Arm, Patient Position: Sitting)   Pulse 76   Ht '5\' 6"'  (1.676 m)   Wt 154 lb (69.9  kg)   BMI 24.86 kg/m   Physical Exam Constitutional:      Appearance: She is well-developed.  HENT:     Head: Normocephalic and atraumatic.  Eyes:     Pupils: Pupils are equal, round, and reactive to light.  Pulmonary:     Effort: Pulmonary effort is normal.     Breath sounds: Normal breath sounds.  Abdominal:     General: Bowel sounds are normal.     Palpations: Abdomen is soft.  Musculoskeletal:     Cervical back: Normal range of motion and neck supple.     Right knee: Effusion present.     Instability Tests: Negative anterior drawer test. Negative posterior drawer test. Positive lateral McMurray test. Negative medial McMurray test.  Skin:    General: Skin is warm and dry.  Neurological:     Mental Status: She is alert and oriented to person, place, and time.  Psychiatric:        Behavior: Behavior normal.        Thought Content: Thought content normal.        Judgment: Judgment normal.     Right  Knee Exam   Tenderness  The patient is experiencing tenderness in the patella, medial retinaculum, lateral retinaculum, medial joint line and lateral joint line.  Range of Motion  Extension:  -40 abnormal  Flexion: 130   Tests  McMurray:  Medial - negative Lateral - positive Varus: negative  Lachman:  Anterior - negative    Posterior - negative Drawer:  Anterior - negative    Posterior - negative Pivot shift: negative Patellar apprehension: negative  Other  Erythema: present Scars: present Sensation: normal Pulse: present Swelling: none Effusion: effusion present      Specialty Comments:  No specialty comments available.  Imaging: XR Knee 1-2 Views Right  Result Date: 03/23/2020 AP and lateral radiographs right knee with right knee intraarticular effusion with suprapatella component greater than periarticular, the suprapatella fluid collection extends to the subcutaneous ft lay and is suspicious for possible distal quadriceps tendon injury or rupture.  There is an extremely thin lucent line transverse through the superior pole of the patella suggestive of a nondisplace superior pole patella fracture. No patella alta noted, Lateral joint line is narrowed and there is retropatella degenerative changes with an irregular contour and retropatella scalloping, minimal superior pole patella spur and inferior pole spur. Mild lateral joint and patellofemoral osteoarthritis with suprapatella effusion.     PMFS History: Patient Active Problem List   Diagnosis Date Noted  . Long term (current) use of anticoagulants [Z79.01] 03/14/2016  . S/P minimally invasive mitral valve repair 03/06/2016  . Pancreatic mass 01/29/2016  . Thyroid nodule 01/29/2016  . Exertional dyspnea 01/05/2016  . 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 Mitral insufficiency   . Mitral valve prolapse 12/26/2015  . Chest pain 11/02/2015  . Anxiety 11/02/2015   Past Medical History:  Diagnosis Date  . Anxiety   . Arthritis    "back?" (11/03/2015)  . Chest pain 11/02/2015  . Depression   . Exertional dyspnea 01/05/2016  . Heart murmur   . History of peptic ulcer "late 1970's"  . Migraine    "maybe 3-4/yr now" (11/03/2015)  . 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 Mitral valve prolapse 12/26/2015   symptomatic"MVP surgery postponed to get lesion of pancreas evaluated first".  . Pancreatic mass 01/29/2016   2.8 cm enhancing mass in the pancreatic neck and 3.1 cm low-attenuation cystic lesion in body of pancreas noted on CT angiogram  . PONV (postoperative nausea and vomiting)   . S/P minimally invasive mitral valve repair 03/06/2016   Complex valvuloplasty including triangular resection of posterior leaflet, artificial Gore-tex neochord placement x6 and 34 mm Memo 3D ring annuloplasty via right mini thoracotomy approach with clipping of LA appendage  . Thyroid nodule 01/29/2016   2.4 cm enhancing mixed cystic and solid nodule inferior left thyroid gland  noted on CT angiogram    Family History  Problem Relation Age of Onset  . Hypertension Mother   . Cancer Mother 58       MELANOMA  . Cancer Father        PROSTATE  . Cancer Sister 20       BREAST    Past Surgical History:  Procedure Laterality Date  . BACK SURGERY    . BREAST BIOPSY Left ~ 2005  . BREAST LUMPECTOMY Left ~ 2005  . CARDIAC CATHETERIZATION N/A 01/16/2016   Procedure: Right/Left Heart Cath and Coronary Angiography;  Surgeon: Sanda Klein, MD;  Location: [facility];  Service: Cardiovascular;  Laterality: N/A;  .  CLIPPING OF ATRIAL APPENDAGE Left 03/06/2016   Procedure: CLIPPING OF LEFT ATRIAL APPENDAGE using a 42 PRO2 AtriClip;  Surgeon: Rexene Alberts, MD;  Location: [facility];  Service: Open Heart Surgery;  Laterality: Left;  . COLONOSCOPY    . EUS N/A 02/20/2016   Procedure: ESOPHAGEAL ENDOSCOPIC ULTRASOUND (EUS) RADIAL;  Surgeon: Arta Silence, MD;  Location: [facility];  Service: Endoscopy;  Laterality: N/A;  . EYE MUSCLE SURGERY Bilateral 1970's?  [place]   "trimmed bulges off both sides"  . MITRAL VALVE REPAIR Right 03/06/2016   Procedure: MINIMALLY INVASIVE MITRAL VALVE REPAIR (MVR) using a 34 Sorin Memo 3D Ring;  Surgeon: Rexene Alberts, MD;  Location: [facility];  Service: Open Heart Surgery;  Laterality: Right;  . TEE WITHOUT CARDIOVERSION N/A 01/05/2016   Procedure: TRANSESOPHAGEAL ECHOCARDIOGRAM (TEE);  Surgeon: Sanda Klein, MD;  Location: [facility];  Service: Cardiovascular;  Laterality: N/A;  . TEE WITHOUT CARDIOVERSION N/A 03/06/2016   Procedure: TRANSESOPHAGEAL ECHOCARDIOGRAM (TEE);  Surgeon: Rexene Alberts, MD;  Location: [facility];  Service: Open Heart Surgery;  Laterality: N/A;  . TUBAL LIGATION     Social History   Occupational History  . Not on file  Tobacco Use  . Smoking status: Never Smoker  . Smokeless tobacco: Never Used  Substance and Sexual Activity  . Alcohol use: Yes    Alcohol/week: 10.0 standard drinks     Types: 6 Glasses of wine, 4 Shots of liquor per week    Comment: occasional  . Drug use: No  . Sexual activity: Not Currently

## 2020-03-23 NOTE — Addendum Note (Signed)
Addended by: Penne Lash, Otis Dials on: 03/23/2020 10:51 AM   Modules accepted: Orders

## 2020-03-24 ENCOUNTER — Telehealth: Payer: Self-pay | Admitting: Radiology

## 2020-03-24 LAB — SYNOVIAL CELL COUNT + DIFF, W/ CRYSTALS
Basophils, %: 0 %
Eosinophils-Synovial: 0 % (ref 0–2)
Lymphocytes-Synovial Fld: 74 % (ref 0–74)
Monocyte/Macrophage: 7 % (ref 0–69)
Neutrophil, Synovial: 19 % (ref 0–24)
Synoviocytes, %: 0 % (ref 0–15)
WBC, Synovial: 62 cells/uL (ref <150)

## 2020-03-24 LAB — CBC WITH DIFFERENTIAL/PLATELET
Absolute Monocytes: 694 cells/uL (ref 200–950)
Basophils Absolute: 78 cells/uL (ref 0–200)
Basophils Relative: 1 %
Eosinophils Absolute: 117 cells/uL (ref 15–500)
Eosinophils Relative: 1.5 %
HCT: 40.9 % (ref 35.0–45.0)
Hemoglobin: 13.5 g/dL (ref 11.7–15.5)
Lymphs Abs: 1630 cells/uL (ref 850–3900)
MCH: 30.1 pg (ref 27.0–33.0)
MCHC: 33 g/dL (ref 32.0–36.0)
MCV: 91.1 fL (ref 80.0–100.0)
MPV: 9.9 fL (ref 7.5–12.5)
Monocytes Relative: 8.9 %
Neutro Abs: 5281 cells/uL (ref 1500–7800)
Neutrophils Relative %: 67.7 %
Platelets: 483 10*3/uL — ABNORMAL HIGH (ref 140–400)
RBC: 4.49 10*6/uL (ref 3.80–5.10)
RDW: 12.1 % (ref 11.0–15.0)
Total Lymphocyte: 20.9 %
WBC: 7.8 10*3/uL (ref 3.8–10.8)

## 2020-03-24 LAB — GRAM STAIN
MICRO NUMBER:: 10292420
SPECIMEN QUALITY:: ADEQUATE

## 2020-03-24 LAB — URIC ACID: Uric Acid, Serum: 3.9 mg/dL (ref 2.5–7.0)

## 2020-03-24 LAB — C-REACTIVE PROTEIN: CRP: 67.3 mg/L — ABNORMAL HIGH (ref <8.0)

## 2020-03-24 LAB — SEDIMENTATION RATE: Sed Rate: 51 mm/h — ABNORMAL HIGH (ref 0–30)

## 2020-03-24 NOTE — Telephone Encounter (Signed)
Received STAT call report from [facility] with [facility] in regards to gram stain that was sent yesterday.   Gram stain shows few polymorphonuclear leukocytes, no organisms seen.   Alcario Drought will fax results to office as well. Other lab work is still pending.

## 2020-03-28 LAB — BODY FLUID CULTURE

## 2020-03-29 ENCOUNTER — Ambulatory Visit
Admission: RE | Admit: 2020-03-29 | Discharge: 2020-03-29 | Disposition: A | Discharge status: Home / Self Care | Payer: Medicare Other | Source: Ambulatory Visit | Attending: Specialist | Admitting: Specialist

## 2020-03-29 DIAGNOSIS — S83104D Unspecified dislocation of right knee, subsequent encounter: Secondary | ICD-10-CM

## 2020-03-29 DIAGNOSIS — M25461 Effusion, right knee: Secondary | ICD-10-CM

## 2020-03-29 DIAGNOSIS — M25561 Pain in right knee: Secondary | ICD-10-CM

## 2020-03-30 ENCOUNTER — Ambulatory Visit (INDEPENDENT_AMBULATORY_CARE_PROVIDER_SITE_OTHER): Payer: Medicare Other | Admitting: Specialist

## 2020-03-30 ENCOUNTER — Other Ambulatory Visit: Payer: Self-pay

## 2020-03-30 ENCOUNTER — Encounter: Payer: Self-pay | Admitting: Specialist

## 2020-03-30 VITALS — BP 124/83 | HR 82 | Ht 66.0 in | Wt 156.0 lb

## 2020-03-30 DIAGNOSIS — S83242D Other tear of medial meniscus, current injury, left knee, subsequent encounter: Secondary | ICD-10-CM

## 2020-03-30 DIAGNOSIS — M25561 Pain in right knee: Secondary | ICD-10-CM

## 2020-03-30 DIAGNOSIS — S8391XA Sprain of unspecified site of right knee, initial encounter: Secondary | ICD-10-CM

## 2020-03-30 DIAGNOSIS — M25461 Effusion, right knee: Secondary | ICD-10-CM

## 2020-03-30 DIAGNOSIS — S83104D Unspecified dislocation of right knee, subsequent encounter: Secondary | ICD-10-CM

## 2020-03-30 DIAGNOSIS — M24661 Ankylosis, right knee: Secondary | ICD-10-CM

## 2020-03-30 DIAGNOSIS — S83251A Bucket-handle tear of lateral meniscus, current injury, right knee, initial encounter: Secondary | ICD-10-CM

## 2020-03-30 MED ORDER — ASPIRIN 81 MG PO TBEC: 81.0000 mg | DELAYED_RELEASE_TABLET | Freq: Every day | ORAL | Status: DC

## 2020-03-30 MED ORDER — OXYCODONE HCL 5 MG PO CAPS: 5.0000 mg | ORAL_CAPSULE | Freq: Four times a day (QID) | ORAL | 0 refills | Status: DC | PRN

## 2020-03-30 NOTE — Patient Instructions (Addendum)
Plan: CPM is ordered to use at home over the Easter long weekend, return on Monday to reassess and determine if surgery is  Needed.  Dr. August Saucer performe an intraarticular injection of 15 cc marcaine then examined the right knee and then wrapped with an ACE wrap. Continue Ibuprofen. Oxycodone sent to her pharmacy.

## 2020-03-30 NOTE — Progress Notes (Signed)
Office Visit Note   Patient: Becky Murphy           Date of Birth: 1951/02/09           MRN: [id number] Visit Date: 03/30/2020              Requested by: Kaleen Mask, MD [address] [place],  [place] [address] PCP: Kaleen Mask, MD   Assessment & Plan: Visit Diagnoses:  1. Right knee pain, unspecified chronicity   2. Effusion, right knee   3. Acute traumatic internal derangement of right knee, subsequent encounter   4. Traumatic hemarthrosis of right knee   5. Other tear of medial meniscus, current injury, left knee, subsequent encounter   6. Bucket-handle tear of lateral meniscus of right knee as current injury, initial encounter   7. Arthrofibrosis of knee joint, right     Plan: CPM is ordered to use at home over the Easter long weekend, return on Monday to reassess and determine if surgery is  Needed.  Dr. August Saucer performe an intraarticular injection of 15 cc marcaine then examined the right knee and then wrapped with an ACE wrap. Continue Ibuprofen. Oxycodone sent to her pharmacy.   Follow-Up Instructions: Return in about 4 days (around 04/03/2020) for See Dr. August Saucer on Monday overbook for assessment. .   Orders:  No orders of the defined types were placed in this encounter.  No orders of the defined types were placed in this encounter.     Procedures: No procedures performed   Clinical Data: No additional findings.   Subjective: Chief Complaint  Patient presents with  . Right Knee - Follow-up    HPI  Review of Systems   Objective: Vital Signs: BP 124/83 (BP Location: Left Arm, Patient Position: Sitting, Cuff Size: Normal)   Pulse 82   Ht 5\' 6"  (1.676 m)   Wt 156 lb (70.8 kg)   BMI 25.18 kg/m   Physical Exam  Ortho Exam  Specialty Comments:  No specialty comments available.  Imaging: MR Knee Right w/o contrast  Result Date: 03/29/2020 CLINICAL DATA:  Twisting injury of the right knee 2 months ago with continued pain,  reduced range of motion, and knee effusion. EXAM: MRI OF THE RIGHT KNEE WITHOUT CONTRAST TECHNIQUE: Multiplanar, multisequence MR imaging of the knee was performed. No intravenous contrast was administered. COMPARISON:  Radiographs 03/23/2020 FINDINGS: Despite efforts by the technologist and patient, motion artifact is present on today's exam and could not be eliminated. This reduces exam sensitivity and specificity. MENISCI Medial meniscus: Diminutive medial meniscus with grade 3 oblique signal in the posterior horn approaching the midbody for example on image 12/9. This grade 3 signal extends to the inferior meniscal surface. There is abnormal grade 3 signal extending along the inferior surface of the posterior horn on image 8/11. I do not see an obvious bucket-handle tear to further explain the very small size of the medial meniscus. Lateral meniscus: Very diminutive size of the lateral meniscus, with mild blunting of the free edge of in the midbody and only a thin rim of residual tissue posteriorly for example on image 22/11. Has the patient undergone prior meniscectomies? Unlike in the medial meniscus, there is no clearly defined surface tear. LIGAMENTS Cruciates:  Unremarkable Collaterals: Mild edema tracks adjacent to the MCL. This can be incidental but in the appropriate clinical circumstance could represent grade 1 sprain. CARTILAGE Patellofemoral: Moderate degenerative chondral thinning in the patella with marginal spurring. Medial: Moderate chondral  thinning with marginal spurring. Chondral fissuring in the medial femoral condyle articular surface posteriorly on images 12-13 of series 10. Small focus of subcortical marrow edema along the posteromedial rim of the tibial plateau. Lateral: Large foci of full-thickness articular cartilage loss centrally and posteriorly along the lateral femoral condyle and lateral tibial plateau as shown for example on image 12/10. Otherwise moderate chondral thinning. Mild  marginal spurring. Small amount of degenerative subcortical marrow edema along the proximal tibial rim. Joint: Large complex and heterogeneous knee effusion pop possibly favoring a large hemarthrosis, but possibly from synovitis. Thickened medial plica. Popliteal Fossa: Low-level infiltrative edema in the popliteal space. Extensor Mechanism: Prepatellar subcutaneous edema and suspected mild prepatellar bursitis on image 15/11. Bones: Low-level subcortical marrow edema posteriorly along the tibial spine. Other: No supplemental non-categorized findings. IMPRESSION: 1. Diminutive medial meniscus with grade 3 oblique signal in the posterior horn approaching the inferior meniscal surface, compatible with a grade 3 oblique tear. 2. The lateral meniscus is also diminutive but not appreciably torn. 3. Large complex and heterogeneous knee effusion probably from hemarthrosis, less likely from extensive synovitis, with thickened medial plica. 4. Varying degrees of articular cartilage thinning, generally moderate, although with some full-thickness loss of articular cartilage focally in the lateral compartment posteriorly. 5. Prepatellar subcutaneous edema and suspected mild prepatellar bursitis. 6. Mild edema tracks adjacent to the MCL. This can be incidental but in the appropriate clinical circumstance could represent grade 1 sprain. 7. Low-level subcortical marrow edema posteriorly along the tibial spine. 8. Despite efforts by the technologist and patient, motion artifact is present on today's exam and could not be eliminated. This reduces exam sensitivity and specificity. Electronically Signed   By: Van Clines M.D.   On: 03/29/2020 18:06     PMFS History: Patient Active Problem List   Diagnosis Date Noted  . Long term (current) use of anticoagulants [Z79.01] 03/14/2016  . S/P minimally invasive mitral valve repair 03/06/2016  . Pancreatic mass 01/29/2016  . Thyroid nodule 01/29/2016  . Exertional dyspnea  01/05/2016  . 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 Mitral insufficiency   . Mitral valve prolapse 12/26/2015  . Chest pain 11/02/2015  . Anxiety 11/02/2015   Past Medical History:  Diagnosis Date  . Anxiety   . Arthritis    "back?" (11/03/2015)  . Chest pain 11/02/2015  . Depression   . Exertional dyspnea 01/05/2016  . Heart murmur   . History of peptic ulcer "late 1970's"  . Migraine    "maybe 3-4/yr now" (11/03/2015)  . 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 Mitral valve prolapse 12/26/2015   symptomatic"MVP surgery postponed to get lesion of pancreas evaluated first".  . Pancreatic mass 01/29/2016   2.8 cm enhancing mass in the pancreatic neck and 3.1 cm low-attenuation cystic lesion in body of pancreas noted on CT angiogram  . PONV (postoperative nausea and vomiting)   . S/P minimally invasive mitral valve repair 03/06/2016   Complex valvuloplasty including triangular resection of posterior leaflet, artificial Gore-tex neochord placement x6 and 34 mm Memo 3D ring annuloplasty via right mini thoracotomy approach with clipping of LA appendage  . Thyroid nodule 01/29/2016   2.4 cm enhancing mixed cystic and solid nodule inferior left thyroid gland noted on CT angiogram    Family History  Problem Relation Age of Onset  . Hypertension Mother   . Cancer Mother 75       MELANOMA  . Cancer Father  PROSTATE  . Cancer Sister 65       BREAST    Past Surgical History:  Procedure Laterality Date  . BACK SURGERY    . BREAST BIOPSY Left ~ 2005  . BREAST LUMPECTOMY Left ~ 2005  . CARDIAC CATHETERIZATION N/A 01/16/2016   Procedure: Right/Left Heart Cath and Coronary Angiography;  Surgeon: Thurmon Fair, MD;  Location: [facility];  Service: Cardiovascular;  Laterality: N/A;  . CLIPPING OF ATRIAL APPENDAGE Left 03/06/2016   Procedure: CLIPPING OF LEFT ATRIAL APPENDAGE using a 45 PRO2 AtriClip;  Surgeon: Purcell Nails, MD;  Location: [facility] OR;   Service: Open Heart Surgery;  Laterality: Left;  . COLONOSCOPY    . EUS N/A 02/20/2016   Procedure: ESOPHAGEAL ENDOSCOPIC ULTRASOUND (EUS) RADIAL;  Surgeon: Willis Modena, MD;  Location: [facility] ENDOS[facility];  Service: Endoscopy;  Laterality: N/A;  . EYE MUSCLE SURGERY Bilateral 1970's?  . LUMBAR DISC SURGERY  1992   "trimmed bulges off both sides"  . MITRAL VALVE REPAIR Right 03/06/2016   Procedure: MINIMALLY INVASIVE MITRAL VALVE REPAIR (MVR) using a 34 Sorin Memo 3D Ring;  Surgeon: Purcell Nails, MD;  Location: [facility] OR;  Service: Open Heart Surgery;  Laterality: Right;  . TEE WITHOUT CARDIOVERSION N/A 01/05/2016   Procedure: TRANSESOPHAGEAL ECHOCARDIOGRAM (TEE);  Surgeon: Thurmon Fair, MD;  Location: [facility] ENDOSCOPY;  Service: Cardiovascular;  Laterality: N/A;  . TEE WITHOUT CARDIOVERSION N/A 03/06/2016   Procedure: TRANSESOPHAGEAL ECHOCARDIOGRAM (TEE);  Surgeon: Purcell Nails, MD;  [facility];  Service: Open Heart Surgery;  Laterality: N/A;  . TUBAL LIGATION     Social History   Occupational History  . Not on file  Tobacco Use  . Smoking status: Never Smoker  . Smokeless tobacco: Never Used  Substance and Sexual Activity  . Alcohol use: Yes    Alcohol/week: 10.0 standard drinks    Types: 6 Glasses of wine, 4 Shots of liquor per week    Comment: occasional  . Drug use: No  . Sexual activity: Not Currently

## 2020-04-03 ENCOUNTER — Encounter (HOSPITAL_COMMUNITY): Payer: Self-pay | Admitting: Orthopedic Surgery

## 2020-04-03 ENCOUNTER — Ambulatory Visit (INDEPENDENT_AMBULATORY_CARE_PROVIDER_SITE_OTHER): Payer: Medicare Other | Admitting: Orthopedic Surgery

## 2020-04-03 ENCOUNTER — Other Ambulatory Visit: Payer: Self-pay

## 2020-04-03 DIAGNOSIS — M24661 Ankylosis, right knee: Secondary | ICD-10-CM

## 2020-04-03 NOTE — Progress Notes (Signed)
Denies chest pain or shortness of breath. States that she was released from cardiology post mitral valve repair. Most recent note from cardiology/ [facility] in 2017 indicates a need for yearly follow-up. Spoke to Dr. Michelle Piper regarding same. Dr. Michelle Piper advised that assigned anesthesiologist will evaluate tomorrow. Patient reports need for pyophylactic antibiotics d/t valve surgery. I advised patient that she had IV antibiotics ordered.    Patient denies exposure to COVID-19. Requested patient go to [facility] for COVID testing in the morning. Educated on visitor restrictions.

## 2020-04-04 ENCOUNTER — Other Ambulatory Visit: Payer: Self-pay

## 2020-04-04 ENCOUNTER — Observation Stay: Payer: Self-pay

## 2020-04-04 ENCOUNTER — Ambulatory Visit (HOSPITAL_COMMUNITY): Payer: Medicare Other

## 2020-04-04 ENCOUNTER — Encounter: Payer: Self-pay | Admitting: Orthopedic Surgery

## 2020-04-04 ENCOUNTER — Encounter (HOSPITAL_COMMUNITY)
Admission: RE | Disposition: A | Discharge status: Home Health | Payer: Self-pay | Source: Home / Self Care | Attending: Orthopedic Surgery

## 2020-04-04 ENCOUNTER — Other Ambulatory Visit (HOSPITAL_COMMUNITY): Payer: Medicare Other

## 2020-04-04 ENCOUNTER — Encounter (HOSPITAL_COMMUNITY): Payer: Self-pay | Admitting: Orthopedic Surgery

## 2020-04-04 ENCOUNTER — Inpatient Hospital Stay (HOSPITAL_COMMUNITY)
Admission: RE | Admit: 2020-04-04 | Discharge: 2020-04-06 | DRG: 487 | Disposition: A | Discharge status: Home Health | Payer: Medicare Other | Attending: Orthopedic Surgery | Admitting: Orthopedic Surgery

## 2020-04-04 DIAGNOSIS — M1711 Unilateral primary osteoarthritis, right knee: Secondary | ICD-10-CM | POA: Diagnosis present

## 2020-04-04 DIAGNOSIS — Z79899 Other long term (current) drug therapy: Secondary | ICD-10-CM

## 2020-04-04 DIAGNOSIS — Z808 Family history of malignant neoplasm of other organs or systems: Secondary | ICD-10-CM

## 2020-04-04 DIAGNOSIS — Z7982 Long term (current) use of aspirin: Secondary | ICD-10-CM

## 2020-04-04 DIAGNOSIS — Z8711 Personal history of peptic ulcer disease: Secondary | ICD-10-CM

## 2020-04-04 DIAGNOSIS — Z20822 Contact with and (suspected) exposure to covid-19: Secondary | ICD-10-CM | POA: Diagnosis present

## 2020-04-04 DIAGNOSIS — M2391 Unspecified internal derangement of right knee: Secondary | ICD-10-CM | POA: Diagnosis present

## 2020-04-04 DIAGNOSIS — M009 Pyogenic arthritis, unspecified: Principal | ICD-10-CM | POA: Diagnosis present

## 2020-04-04 DIAGNOSIS — Z8249 Family history of ischemic heart disease and other diseases of the circulatory system: Secondary | ICD-10-CM

## 2020-04-04 DIAGNOSIS — M23203 Derangement of unspecified medial meniscus due to old tear or injury, right knee: Secondary | ICD-10-CM | POA: Diagnosis present

## 2020-04-04 DIAGNOSIS — M65161 Other infective (teno)synovitis, right knee: Secondary | ICD-10-CM | POA: Diagnosis present

## 2020-04-04 DIAGNOSIS — M23200 Derangement of unspecified lateral meniscus due to old tear or injury, right knee: Secondary | ICD-10-CM | POA: Diagnosis present

## 2020-04-04 DIAGNOSIS — M659 Synovitis and tenosynovitis, unspecified: Secondary | ICD-10-CM | POA: Diagnosis present

## 2020-04-04 HISTORY — PX: KNEE ARTHROSCOPY: SHX127

## 2020-04-04 LAB — RESPIRATORY PANEL BY RT PCR (FLU A&B, COVID)
Influenza A by PCR: NEGATIVE
Influenza B by PCR: NEGATIVE
SARS Coronavirus 2 by RT PCR: NEGATIVE

## 2020-04-04 LAB — CBC
HCT: 40.2 % (ref 36.0–46.0)
Hemoglobin: 12.9 g/dL (ref 12.0–15.0)
MCH: 29.6 pg (ref 26.0–34.0)
MCHC: 32.1 g/dL (ref 30.0–36.0)
MCV: 92.2 fL (ref 80.0–100.0)
Platelets: 294 10*3/uL (ref 150–400)
RBC: 4.36 MIL/uL (ref 3.87–5.11)
RDW: 12.9 % (ref 11.5–15.5)
WBC: 7 10*3/uL (ref 4.0–10.5)
nRBC: 0 % (ref 0.0–0.2)

## 2020-04-04 LAB — BASIC METABOLIC PANEL
Anion gap: 12 (ref 5–15)
BUN: 15 mg/dL (ref 8–23)
CO2: 22 mmol/L (ref 22–32)
Calcium: 9 mg/dL (ref 8.9–10.3)
Chloride: 106 mmol/L (ref 98–111)
Creatinine, Ser: 0.67 mg/dL (ref 0.44–1.00)
GFR calc Af Amer: >60 mL/min (ref >60)
GFR calc non Af Amer: >60 mL/min (ref >60)
Glucose, Bld: 99 mg/dL (ref 70–99)
Potassium: 3.7 mmol/L (ref 3.5–5.1)
Sodium: 140 mmol/L (ref 135–145)

## 2020-04-04 SURGERY — ARTHROSCOPY, KNEE
Anesthesia: General | Site: Knee | Laterality: Right

## 2020-04-04 MED ORDER — FENTANYL CITRATE (PF) 100 MCG/2ML IJ SOLN
INTRAMUSCULAR | Status: AC
  Administered 2020-04-04: 50 ug via INTRAVENOUS
  Filled 2020-04-04: qty 2

## 2020-04-04 MED ORDER — BUPIVACAINE HCL (PF) 0.25 % IJ SOLN
INTRAMUSCULAR | Status: DC | PRN
  Administered 2020-04-04: 30 mL
  Administered 2020-04-04: 10 mL

## 2020-04-04 MED ORDER — CIPROFLOXACIN HCL 500 MG PO TABS
500.0000 mg | ORAL_TABLET | Freq: Two times a day (BID) | ORAL | Status: DC
  Administered 2020-04-04 – 2020-04-06 (×4): 500 mg via ORAL
  Filled 2020-04-04 (×4): qty 1

## 2020-04-04 MED ORDER — ASPIRIN EC 81 MG PO TBEC
81.0000 mg | DELAYED_RELEASE_TABLET | Freq: Every day | ORAL | Status: DC
  Administered 2020-04-05 – 2020-04-06 (×2): 81 mg via ORAL
  Filled 2020-04-04 (×3): qty 1

## 2020-04-04 MED ORDER — EPINEPHRINE PF 1 MG/ML IJ SOLN
INTRAMUSCULAR | Status: AC
  Filled 2020-04-04: qty 2

## 2020-04-04 MED ORDER — ONDANSETRON HCL 4 MG/2ML IJ SOLN: 4.0000 mg | Freq: Four times a day (QID) | INTRAMUSCULAR | Status: DC | PRN

## 2020-04-04 MED ORDER — FENTANYL CITRATE (PF) 100 MCG/2ML IJ SOLN
INTRAMUSCULAR | Status: AC
  Filled 2020-04-04: qty 2

## 2020-04-04 MED ORDER — TRANEXAMIC ACID 1000 MG/10ML IV SOLN
INTRAVENOUS | Status: DC | PRN
  Administered 2020-04-04: 1000 mg via TOPICAL

## 2020-04-04 MED ORDER — MIDAZOLAM HCL 2 MG/2ML IJ SOLN: 1.0000 mg | Freq: Once | INTRAMUSCULAR | Status: AC

## 2020-04-04 MED ORDER — TRANEXAMIC ACID-NACL 1000-0.7 MG/100ML-% IV SOLN
INTRAVENOUS | Status: AC
  Filled 2020-04-04: qty 100

## 2020-04-04 MED ORDER — LACTATED RINGERS IV SOLN: INTRAVENOUS | Status: DC

## 2020-04-04 MED ORDER — FENTANYL CITRATE (PF) 250 MCG/5ML IJ SOLN
INTRAMUSCULAR | Status: AC
  Filled 2020-04-04: qty 5

## 2020-04-04 MED ORDER — MIDAZOLAM HCL 2 MG/2ML IJ SOLN: 2.0000 mg | Freq: Once | INTRAMUSCULAR | Status: DC

## 2020-04-04 MED ORDER — VANCOMYCIN HCL IN DEXTROSE 1-5 GM/200ML-% IV SOLN: 1000.0000 mg | Freq: Two times a day (BID) | INTRAVENOUS | Status: DC

## 2020-04-04 MED ORDER — GENTAMICIN SULFATE 40 MG/ML IJ SOLN
INTRAMUSCULAR | Status: DC | PRN
  Administered 2020-04-04 (×2): 80 mg

## 2020-04-04 MED ORDER — TRANEXAMIC ACID 1000 MG/10ML IV SOLN
2000.0000 mg | Freq: Once | INTRAVENOUS | Status: DC
  Filled 2020-04-04: qty 20

## 2020-04-04 MED ORDER — EPINEPHRINE PF 1 MG/ML IJ SOLN
INTRAMUSCULAR | Status: AC
  Filled 2020-04-04: qty 4

## 2020-04-04 MED ORDER — MORPHINE SULFATE 4 MG/ML IJ SOLN
INTRAMUSCULAR | Status: DC | PRN
  Administered 2020-04-04 (×2): 4 mg

## 2020-04-04 MED ORDER — MORPHINE SULFATE (PF) 4 MG/ML IV SOLN
INTRAVENOUS | Status: AC
  Filled 2020-04-04: qty 2

## 2020-04-04 MED ORDER — OXYCODONE HCL 5 MG PO TABS
5.0000 mg | ORAL_TABLET | ORAL | Status: DC | PRN
  Administered 2020-04-04 – 2020-04-06 (×9): 10 mg via ORAL
  Filled 2020-04-04 (×9): qty 2

## 2020-04-04 MED ORDER — MIDAZOLAM HCL 2 MG/2ML IJ SOLN
INTRAMUSCULAR | Status: AC
  Filled 2020-04-04: qty 2

## 2020-04-04 MED ORDER — VANCOMYCIN HCL 1000 MG IV SOLR
INTRAVENOUS | Status: DC | PRN
  Administered 2020-04-04: 1000 mg via TOPICAL

## 2020-04-04 MED ORDER — GENTAMICIN SULFATE 40 MG/ML IJ SOLN
INTRAMUSCULAR | Status: AC
  Filled 2020-04-04: qty 4

## 2020-04-04 MED ORDER — PROPOFOL 10 MG/ML IV BOLUS
INTRAVENOUS | Status: DC | PRN
  Administered 2020-04-04: 160 mg via INTRAVENOUS

## 2020-04-04 MED ORDER — FENTANYL CITRATE (PF) 250 MCG/5ML IJ SOLN
INTRAMUSCULAR | Status: DC | PRN
  Administered 2020-04-04 (×2): 50 ug via INTRAVENOUS

## 2020-04-04 MED ORDER — ONDANSETRON HCL 4 MG/2ML IJ SOLN: 4.0000 mg | Freq: Once | INTRAMUSCULAR | Status: DC | PRN

## 2020-04-04 MED ORDER — MIDAZOLAM HCL 2 MG/2ML IJ SOLN
INTRAMUSCULAR | Status: AC
  Administered 2020-04-04: 1 mg via INTRAVENOUS
  Filled 2020-04-04: qty 2

## 2020-04-04 MED ORDER — FENTANYL CITRATE (PF) 100 MCG/2ML IJ SOLN: 100.0000 ug | Freq: Once | INTRAMUSCULAR | Status: DC

## 2020-04-04 MED ORDER — HYDROMORPHONE HCL 1 MG/ML IJ SOLN: 0.5000 mg | INTRAMUSCULAR | Status: DC | PRN

## 2020-04-04 MED ORDER — ONDANSETRON HCL 4 MG/2ML IJ SOLN
INTRAMUSCULAR | Status: DC | PRN
  Administered 2020-04-04: 4 mg via INTRAVENOUS

## 2020-04-04 MED ORDER — EPINEPHRINE PF 1 MG/ML IJ SOLN
INTRAMUSCULAR | Status: DC | PRN
  Administered 2020-04-04 (×2): 1 mg
  Administered 2020-04-04: .15 mL
  Administered 2020-04-04 (×2): 1 mg

## 2020-04-04 MED ORDER — OXYCODONE HCL 5 MG/5ML PO SOLN: 5.0000 mg | Freq: Once | ORAL | Status: DC | PRN

## 2020-04-04 MED ORDER — DOCUSATE SODIUM 100 MG PO CAPS
100.0000 mg | ORAL_CAPSULE | Freq: Two times a day (BID) | ORAL | Status: DC
  Administered 2020-04-04 – 2020-04-06 (×4): 100 mg via ORAL
  Filled 2020-04-04 (×4): qty 1

## 2020-04-04 MED ORDER — ACETAMINOPHEN 500 MG PO TABS
1000.0000 mg | ORAL_TABLET | Freq: Four times a day (QID) | ORAL | Status: AC
  Administered 2020-04-04 – 2020-04-05 (×4): 1000 mg via ORAL
  Filled 2020-04-04 (×4): qty 2

## 2020-04-04 MED ORDER — CEFAZOLIN SODIUM-DEXTROSE 2-4 GM/100ML-% IV SOLN
INTRAVENOUS | Status: AC
  Filled 2020-04-04: qty 100

## 2020-04-04 MED ORDER — VANCOMYCIN HCL 1000 MG IV SOLR
INTRAVENOUS | Status: AC
  Filled 2020-04-04: qty 1000

## 2020-04-04 MED ORDER — DEXAMETHASONE SODIUM PHOSPHATE 10 MG/ML IJ SOLN
INTRAMUSCULAR | Status: DC | PRN
  Administered 2020-04-04: 10 mg via INTRAVENOUS

## 2020-04-04 MED ORDER — METOCLOPRAMIDE HCL 5 MG/ML IJ SOLN: 5.0000 mg | Freq: Three times a day (TID) | INTRAMUSCULAR | Status: DC | PRN

## 2020-04-04 MED ORDER — CLONIDINE HCL (ANALGESIA) 100 MCG/ML EP SOLN
EPIDURAL | Status: DC | PRN
  Administered 2020-04-04: 100 ug

## 2020-04-04 MED ORDER — FENTANYL CITRATE (PF) 100 MCG/2ML IJ SOLN
25.0000 ug | INTRAMUSCULAR | Status: DC | PRN
  Administered 2020-04-04: 50 ug via INTRAVENOUS
  Administered 2020-04-04: 25 ug via INTRAVENOUS
  Administered 2020-04-04: 50 ug via INTRAVENOUS

## 2020-04-04 MED ORDER — METOCLOPRAMIDE HCL 5 MG PO TABS: 5.0000 mg | ORAL_TABLET | Freq: Three times a day (TID) | ORAL | Status: DC | PRN

## 2020-04-04 MED ORDER — SODIUM CHLORIDE 0.9 % IR SOLN
Status: DC | PRN
  Administered 2020-04-04 (×5): 3000 mL

## 2020-04-04 MED ORDER — OXYCODONE HCL 5 MG PO TABS: 5.0000 mg | ORAL_TABLET | Freq: Once | ORAL | Status: DC | PRN

## 2020-04-04 MED ORDER — ONDANSETRON HCL 4 MG PO TABS: 4.0000 mg | ORAL_TABLET | Freq: Four times a day (QID) | ORAL | Status: DC | PRN

## 2020-04-04 MED ORDER — POVIDONE-IODINE 10 % EX SWAB: 2.0000 "application " | Freq: Once | CUTANEOUS | Status: DC

## 2020-04-04 MED ORDER — LIDOCAINE 2% (20 MG/ML) 5 ML SYRINGE
INTRAMUSCULAR | Status: DC | PRN
  Administered 2020-04-04: 40 mg via INTRAVENOUS

## 2020-04-04 MED ORDER — BUPIVACAINE HCL (PF) 0.25 % IJ SOLN
INTRAMUSCULAR | Status: AC
  Filled 2020-04-04: qty 60

## 2020-04-04 MED ORDER — FENTANYL CITRATE (PF) 100 MCG/2ML IJ SOLN: 50.0000 ug | Freq: Once | INTRAMUSCULAR | Status: AC

## 2020-04-04 MED ORDER — VANCOMYCIN HCL 1500 MG/300ML IV SOLN
1500.0000 mg | INTRAVENOUS | Status: DC
  Administered 2020-04-04 – 2020-04-05 (×2): 1500 mg via INTRAVENOUS
  Filled 2020-04-04 (×2): qty 300

## 2020-04-04 MED ORDER — CEFAZOLIN SODIUM-DEXTROSE 2-4 GM/100ML-% IV SOLN
2.0000 g | INTRAVENOUS | Status: AC
  Administered 2020-04-04: 15:00:00 2 g via INTRAVENOUS

## 2020-04-04 MED ORDER — PHENYLEPHRINE 40 MCG/ML (10ML) SYRINGE FOR IV PUSH (FOR BLOOD PRESSURE SUPPORT)
PREFILLED_SYRINGE | INTRAVENOUS | Status: DC | PRN
  Administered 2020-04-04 (×2): 80 ug via INTRAVENOUS

## 2020-04-04 SURGICAL SUPPLY — 55 items
BANDAGE ESMARK 6X9 LF (GAUZE/BANDAGES/DRESSINGS) IMPLANT
BLADE CLIPPER SURG (BLADE) IMPLANT
BLADE EXCALIBUR 4.0MM X 13CM (MISCELLANEOUS) ×1
BLADE EXCALIBUR 4.0X13 (MISCELLANEOUS) ×2 IMPLANT
BNDG ELASTIC 6X10 VLCR STRL LF (GAUZE/BANDAGES/DRESSINGS) ×2 IMPLANT
BNDG ESMARK 6X9 LF (GAUZE/BANDAGES/DRESSINGS)
COVER SURGICAL LIGHT HANDLE (MISCELLANEOUS) ×3 IMPLANT
COVER WAND RF STERILE (DRAPES) ×3 IMPLANT
CUFF TOURN SGL QUICK 34 (TOURNIQUET CUFF)
CUFF TOURN SGL QUICK 42 (TOURNIQUET CUFF) IMPLANT
CUFF TRNQT CYL 34X4.125X (TOURNIQUET CUFF) IMPLANT
DRAPE ARTHROSCOPY W/POUCH 114 (DRAPES) ×3 IMPLANT
DRAPE U-SHAPE 47X51 STRL (DRAPES) ×3 IMPLANT
DRSG TEGADERM 4X4.5 CHG (GAUZE/BANDAGES/DRESSINGS) ×2 IMPLANT
DRSG TEGADERM 4X4.75 (GAUZE/BANDAGES/DRESSINGS) ×3 IMPLANT
DRSG XEROFORM 1X8 (GAUZE/BANDAGES/DRESSINGS) ×2 IMPLANT
DURAPREP 26ML APPLICATOR (WOUND CARE) ×3 IMPLANT
DW OUTFLOW CASSETTE/TUBE SET (MISCELLANEOUS) ×3 IMPLANT
GAUZE SPONGE 4X4 12PLY STRL (GAUZE/BANDAGES/DRESSINGS) IMPLANT
GAUZE SPONGE 4X4 16PLY XRAY LF (GAUZE/BANDAGES/DRESSINGS) ×2 IMPLANT
GAUZE XEROFORM 1X8 LF (GAUZE/BANDAGES/DRESSINGS) IMPLANT
GLOVE BIOGEL PI IND STRL 8 (GLOVE) ×1 IMPLANT
GLOVE BIOGEL PI INDICATOR 8 (GLOVE) ×2
GLOVE ECLIPSE 8.0 STRL XLNG CF (GLOVE) ×3 IMPLANT
GOWN STRL REUS W/ TWL LRG LVL3 (GOWN DISPOSABLE) ×2 IMPLANT
GOWN STRL REUS W/ TWL XL LVL3 (GOWN DISPOSABLE) ×1 IMPLANT
GOWN STRL REUS W/TWL LRG LVL3 (GOWN DISPOSABLE) ×6
GOWN STRL REUS W/TWL XL LVL3 (GOWN DISPOSABLE) ×3
IMMOBILIZER KNEE 22 UNIV (SOFTGOODS) ×2 IMPLANT
IV NS IRRIG 3000ML ARTHROMATIC (IV SOLUTION) ×4 IMPLANT
KIT BASIN OR (CUSTOM PROCEDURE TRAY) ×3 IMPLANT
KIT STIMULAN RAPID CURE 5CC (Orthopedic Implant) ×2 IMPLANT
KIT TURNOVER KIT B (KITS) ×3 IMPLANT
MANIFOLD NEPTUNE II (INSTRUMENTS) IMPLANT
NDL 18GX1X1/2 (RX/OR ONLY) (NEEDLE) IMPLANT
NDL HYPO 25GX1X1/2 BEV (NEEDLE) ×1 IMPLANT
NEEDLE 18GX1X1/2 (RX/OR ONLY) (NEEDLE) IMPLANT
NEEDLE HYPO 25GX1X1/2 BEV (NEEDLE) ×3 IMPLANT
NS IRRIG 1000ML POUR BTL (IV SOLUTION) IMPLANT
PACK ARTHROSCOPY DSU (CUSTOM PROCEDURE TRAY) ×3 IMPLANT
PAD ARMBOARD 7.5X6 YLW CONV (MISCELLANEOUS) ×6 IMPLANT
PADDING CAST COTTON 6X4 STRL (CAST SUPPLIES) ×3 IMPLANT
PORT APPOLLO RF 90DEGREE MULTI (SURGICAL WAND) ×2 IMPLANT
SPONGE LAP 4X18 RFD (DISPOSABLE) ×3 IMPLANT
SUT ETHILON 3 0 PS 1 (SUTURE) ×5 IMPLANT
SYR 20ML ECCENTRIC (SYRINGE) ×3 IMPLANT
SYR CONTROL 10ML LL (SYRINGE) IMPLANT
SYR TB 1ML LUER SLIP (SYRINGE) ×3 IMPLANT
TOWEL GREEN STERILE (TOWEL DISPOSABLE) ×3 IMPLANT
TOWEL GREEN STERILE FF (TOWEL DISPOSABLE) ×3 IMPLANT
TUBE CONNECTING 12'X1/4 (SUCTIONS) ×1
TUBE CONNECTING 12X1/4 (SUCTIONS) ×2 IMPLANT
TUBING ARTHROSCOPY IRRIG 16FT (MISCELLANEOUS) ×3 IMPLANT
WAND STAR VAC 90 (SURGICAL WAND) IMPLANT
WATER STERILE IRR 1000ML POUR (IV SOLUTION) ×3 IMPLANT

## 2020-04-04 NOTE — H&P (Signed)
Becky Murphy is an 69 y.o. female.   Chief Complaint: Right knee pain HPI: Becky Murphy is a 69 year old patient with right knee pain.  Been going on since an injury in January.  Stepped out of the truck and had a twisting injury at that time.  Developed some swelling but swelling became worse after an injection about 2 to 3 weeks after the initial injury.  She subsequently had MRI scan and persistent swelling in the knee.  MRI scan shows attenuated menisci both medially and laterally.  On my review it looks like that medial meniscal horn may be adjacent to the PCL consistent with meniscal injury.  Mild to moderate arthritis is present but not severe.  What is compelling is the amount of synovial inflammation and hypertrophy within the suprapatellar pouch.  Described as a complex fluid collection on MRI scan.  Discussed with another MRI radiologist this morning.  Nonetheless her pain is incapacitating and this does not look like a neoplastic process.  Past Medical History:  Diagnosis Date  . Anxiety   . Arthritis    "back?" (11/03/2015)  . Chest pain 11/02/2015  . Depression   . Exertional dyspnea 01/05/2016  . Heart murmur   . History of peptic ulcer "late 1970's"  . Migraine    "maybe 3-4/yr now" (11/03/2015)  . 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 Mitral valve prolapse 12/26/2015   symptomatic"MVP surgery postponed to get lesion of pancreas evaluated first".  . Pancreatic mass 01/29/2016   2.8 cm enhancing mass in the pancreatic neck and 3.1 cm low-attenuation cystic lesion in body of pancreas noted on CT angiogram  . PONV (postoperative nausea and vomiting)   . S/P minimally invasive mitral valve repair 03/06/2016   Complex valvuloplasty including triangular resection of posterior leaflet, artificial Gore-tex neochord placement x6 and 34 mm Memo 3D ring annuloplasty via right mini thoracotomy approach with clipping of LA appendage  . Thyroid nodule 01/29/2016   2.4 cm enhancing mixed  cystic and solid nodule inferior left thyroid gland noted on CT angiogram    Past Surgical History:  Procedure Laterality Date  . BACK SURGERY    . BREAST BIOPSY Left ~ 2005  . BREAST LUMPECTOMY Left ~ 2005  . CARDIAC CATHETERIZATION N/A 01/16/2016   Procedure: Right/Left Heart Cath and Coronary Angiography;  Surgeon: Thurmon Fair, MD;  Location: [facility];  Service: Cardiovascular;  Laterality: N/A;  . CLIPPING OF ATRIAL APPENDAGE Left 03/06/2016   Procedure: CLIPPING OF LEFT ATRIAL APPENDAGE using a 45 PRO2 AtriClip;  Surgeon: Purcell Nails, MD;  Location: [facility] OR;  Service: Open Heart Surgery;  Laterality: Left;  . COLONOSCOPY    . EUS N/A 02/20/2016   Procedure: ESOPHAGEAL ENDOSCOPIC ULTRASOUND (EUS) RADIAL;  Surgeon: Willis Modena, MD;  Location: [facility] ENDOSCOPY;  Service: Endoscopy;  Laterality: N/A;  . EYE MUSCLE SURGERY Bilateral 1970's?  . LUMBAR DISC SURGERY  1992   "trimmed bulges off both sides"  . MITRAL VALVE REPAIR Right 03/06/2016   Procedure: MINIMALLY INVASIVE MITRAL VALVE REPAIR (MVR) using a 34 Sorin Memo 3D Ring;  Surgeon: Purcell Nails, MD;  Location: [facility] OR;  Service: Open Heart Surgery;  Laterality: Right;  . TEE WITHOUT CARDIOVERSION N/A 01/05/2016   Procedure: TRANSESOPHAGEAL ECHOCARDIOGRAM (TEE);  Surgeon: Thurmon Fair, MD;  Location: [facility] ENDOSCOPY;  Service: Cardiovascular;  Laterality: N/A;  . TEE WITHOUT CARDIOVERSION N/A 03/06/2016   Procedure: TRANSESOPHAGEAL ECHOCARDIOGRAM (TEE);  Surgeon: Purcell Nails, MD;  Location:  [facility] OR;  Service: Open Heart Surgery;  Laterality: N/A;  . TUBAL LIGATION      Family History  Problem Relation Age of Onset  . Hypertension Mother   . Cancer Mother 59       MELANOMA  . Cancer Father        PROSTATE  . Cancer Sister 41       BREAST   Social History:  reports that she has never smoked. She has never used smokeless tobacco. She reports current alcohol use of about 10.0 standard drinks of alcohol per week. She  reports that she does not use drugs.  Allergies: No Known Allergies  Medications Prior to Admission  Medication Sig Dispense Refill  . aspirin 81 MG EC tablet Take 1 tablet (81 mg total) by mouth daily. 30 tablet   . cholecalciferol (VITAMIN D3) 25 MCG (1000 UNIT) tablet Take 1,000 Units by mouth daily.    . Digestive Enzymes (DIGESTIVE ENZYME PO) Take 1 capsule by mouth daily.    Marland Kitchen ibuprofen (ADVIL) 800 MG tablet Take 1 tablet (800 mg total) by mouth every 8 (eight) hours as needed. (Patient taking differently: Take 800 mg by mouth every 8 (eight) hours as needed for mild pain or moderate pain. ) 30 tablet 0  . oxycodone (OXY-IR) 5 MG capsule Take 1 capsule (5 mg total) by mouth every 6 (six) hours as needed. 30 capsule 0    Results for orders placed or performed during the hospital encounter of 04/04/20 (from the past 48 hour(s))  Respiratory Panel by RT PCR (Flu A&B, Covid) - Nasopharyngeal Swab     Status: None   Collection Time: 04/04/20 10:08 AM   Specimen: Nasopharyngeal Swab  Result Value Ref Range   SARS Coronavirus 2 by RT PCR NEGATIVE NEGATIVE    Comment: (NOTE) SARS-CoV-2 target nucleic acids are NOT DETECTED. The SARS-CoV-2 RNA is generally detectable in upper respiratoy specimens during the acute phase of infection. The lowest concentration of SARS-CoV-2 viral copies this assay can detect is 131 copies/mL. A negative result does not preclude SARS-Cov-2 infection and should not be used as the sole basis for treatment or other patient management decisions. A negative result may occur with  improper specimen collection/handling, submission of specimen other than nasopharyngeal swab, presence of viral mutation(s) within the areas targeted by this assay, and inadequate number of viral copies (<131 copies/mL). A negative result must be combined with clinical observations, patient history, and epidemiological information. The expected result is Negative. Fact Sheet for  Patients:  https://www.moore.com/ Fact Sheet for Healthcare Providers:  https://www.young.biz/ This test is not yet ap proved or cleared by the [place] FDA and  has been authorized for detection and/or diagnosis of SARS-CoV-2 by FDA under an Emergency Use Authorization (EUA). This EUA will remain  in effect (meaning this test can be used) for the duration of the COVID-19 declaration under Section 564(b)(1) of the Act, 21 U.S.C. section 360bbb-3(b)(1), unless the authorization is terminated or revoked sooner.    Influenza A by PCR NEGATIVE NEGATIVE   Influenza B by PCR NEGATIVE NEGATIVE    Comment: (NOTE) The Xpert Xpress SARS-CoV-2/FLU/RSV assay is intended as an aid in  the diagnosis of influenza from Nasopharyngeal swab specimens and  should not be used as a sole basis for treatment. Nasal washings and  aspirates are unacceptable for Xpert Xpress SARS-CoV-2/FLU/RSV  testing. Fact Sheet for Patients: https://www.moore.com/ Fact Sheet for Healthcare Providers: https://www.young.biz/ This test is not yet approved or  cleared by the [place] and  has been authorized for detection and/or diagnosis of SARS-CoV-2 by  FDA under an Emergency Use Authorization (EUA). This EUA will remain  in effect (meaning this test can be used) for the duration of the  Covid-19 declaration under Section 564(b)(1) of the Act, 21  U.S.C. section 360bbb-3(b)(1), unless the authorization is  terminated or revoked. Performed at [facility], [place] [address], [place], [place] [address]    No results found.  Review of Systems  Musculoskeletal: Positive for arthralgias.  All other systems reviewed and are negative.   Blood pressure (!) 179/101, pulse 72, temperature 98.6 F (37 C), temperature source Oral, resp. rate 20, height 5\' 6"  (1.676 m), weight 70.8 kg, SpO2 96 %. Physical Exam  Constitutional: She  appears well-developed.  HENT:  Head: Normocephalic.  Eyes: Pupils are equal, round, and reactive to light.  Cardiovascular: Normal rate.  Respiratory: Effort normal.  Musculoskeletal:     Cervical back: Normal range of motion.  Neurological: She is alert.  Skin: Skin is warm.  Psychiatric: She has a normal mood and affect.  Examination of the right knee demonstrates swelling and synovial fullness in the suprapatellar pouch region of the right knee.  There is no proximal lymphadenopathy.  Pedal pulses palpable.  Extension is more painful than flexion.  Collaterals are stable.  Not much in the way of warmth right knee versus left knee.  Mild posterior tenderness but negative Homans and no asymmetric calf swelling right versus left  Assessment/Plan Impression is right knee pain with complex fluid collection within the suprapatellar pouch and evidence of injury with meniscal damage.  Plan is arthroscopy with potential biopsy specimens and debridement of the suprapatellar pouch.  Risk benefits are discussed include not limited to infection nerve vessel damage incomplete pain relief as well as potential need for more surgery.  Differential diagnosis would be synovial inflammation versus complex partially coagulated massive tissue within the knee joint.  Infection less likely but considered although she is having no systemic symptoms.  PVNS also unlikely because of the MRI characteristics.  Neoplasm also unlikely based on the MRI characteristics per my conversation with radiologist this morning.  Pain is incapacitating and I think arthroscopy and debridement is indicated.  Would likely use TXA both intra-articular and extra-articular (IV) in order to diminish bleeding after the procedure.  She has a CPM machine at home to facilitate range of motion.  All questions answered.  Anderson Malta, MD 04/04/2020, 11:12 AM

## 2020-04-04 NOTE — Plan of Care (Signed)

## 2020-04-04 NOTE — Anesthesia Procedure Notes (Signed)
Anesthesia Regional Block: Adductor canal block   Pre-Anesthetic Checklist: ,, timeout performed, Correct Patient, Correct Site, Correct Laterality, Correct Procedure, Correct Position, site marked, Risks and benefits discussed, pre-op evaluation,  At surgeon's request and post-op pain management  Laterality: Right  Prep: Maximum Sterile Barrier Precautions used, chloraprep       Needles:  Injection technique: Single-shot  Needle Type: Echogenic Stimulator Needle     Needle Length: 9cm  Needle Gauge: 21     Additional Needles:   Procedures:,,,, ultrasound used (permanent image in chart),,,,  Narrative:  Start time: 04/04/2020 11:25 AM End time: 04/04/2020 11:35 AM Injection made incrementally with aspirations every 5 mL.  Performed by: Personally  Anesthesiologist: Kipp Brood, MD  Additional Notes: 20 cc 0.75% Ropivacaine injected easily

## 2020-04-04 NOTE — Brief Op Note (Signed)
   04/04/2020  4:13 PM  PATIENT:  Becky Murphy  69 y.o. female  PRE-OPERATIVE DIAGNOSIS:  acute traumatic internal derangement of right knee  POST-OPERATIVE DIAGNOSIS:  acute traumatic internal derangement of right knee,medial and lateral meniscal tears, possible occult infection  PROCEDURE:  Procedure(s): RIGHT KNEE ARTHROSCOPY WITH DEBRIDEMENT, partial medial lateral meniscectomies for degenerative meniscal tears  SURGEON:  Surgeon(s): Cammy Copa, MD  ASSISTANT: Karenann Cai, PA  ANESTHESIA:   general  EBL: 15 ml    Total I/O In: 1300 [I.V.:1200; IV Piggyback:100] Out: 30 [Blood:30]  BLOOD ADMINISTERED: none  DRAINS: none   LOCAL MEDICATIONS USED: Stimulant beads composed of vancomycin and gentamicin along with Marcaine morphine and clonidine  SPECIMEN: Knee fluid sent for Gram stain and culture, knee tissue sent for culture.  COUNTS:  YES  TOURNIQUET:  * Missing tourniquet times found for documented tourniquets in log: [id number] *  DICTATION: .Other Dictation: Dictation Number [id number]  PLAN OF CARE: Admit for overnight observation  PATIENT DISPOSITION:  PACU - hemodynamically stable

## 2020-04-04 NOTE — Progress Notes (Signed)
Dr. Noreene Larsson has been made aware of patient's blood pressure. No new orders received. Dr. Noreene Larsson said pressures would be addressed in the OR.

## 2020-04-04 NOTE — Op Note (Signed)
NAME: Becky Murphy, Becky Murphy MEDICAL RECORD OA:[id number] ACCOUNT [id number] DATE OF BIRTH:Nov 24, 1951 FACILITY: [facility] LOCATION: [facility] PHYSICIAN:Gearlene Godsil Diamantina Providence, MD  OPERATIVE REPORT  DATE OF PROCEDURE:  04/04/2020  PREOPERATIVE DIAGNOSIS:  Right knee synovitis.  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  PROCEDURES:   1.  Right knee arthroscopy. 2.  Extensive anterior compartment synovectomy. 3.  Partial medial and lateral meniscectomy.  SURGEON:  Cammy Copa, MD  ASSISTANT:  Karenann Cai, [place].  INDICATIONS:  The patient is a 69 year old patient with right knee pain following an injury back in January.  MRI scan shows complex fluid collection and synovitis within the suprapatellar pouch.  Cultures have been negative from attempted aspiration.   She presents now for operative management after worsening of symptoms.  PROCEDURE IN DETAIL:  The patient was brought to the operating room where general anesthetic was induced.  Preoperative IV antibiotics were administered.  Right leg was prescrubbed with alcohol and Betadine, allowed to air dry, prepped with DuraPrep  solution and draped in a sterile manner.  Marcaine with epinephrine was used to anesthetize the portal sites.  Timeout was called.  Anterior inferolateral portal was established, anterior inferomedial portal was established under direct visualization.   Diagnostic arthroscopy was performed.  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  Degenerative meniscal tearing was present, which was debrided.  This was on both sides.  No loose  chondral flaps.  No exposed subchondral bone on the medial and lateral compartment.  There was significant synovitis, both within the notch as well as throughout the knee and the medial and lateral gutters as well as in the  anterior compartment.   Debridement of the meniscal tears was performed.  Arthrocare wand was used to control bleeding points.  No loose bodies were present.  The shaver was then used to perform extensive synovectomy of the gutters and anterior compartment, as well as the  superior pouch region.  Following this, thorough irrigation with about 10 L of irrigating solution was performed.  Next, Stimulan beads composed of vancomycin and gentamicin were placed into the suprapatellar pouch region.  The 3 portals were then closed  using 3-0 nylon.  A solution of Marcaine, morphine, clonidine then injected into the knee for postop pain relief.  The patient tolerated the procedure well without immediate complications.  Luke's assistance was required for limb positioning as well as  placement of the beads into the knee.  His assistance was a medical necessity.  VN/NUANCE  D:04/04/2020 T:04/04/2020 JOB:[id number]

## 2020-04-04 NOTE — Progress Notes (Signed)
Office Visit Note   Patient: Becky Murphy           Date of Birth: August 22, 1951           MRN: [id number] Visit Date: 04/03/2020 Requested by: Kaleen Mask, MD [address] [place],  [place] [address] PCP: Kaleen Mask, MD  Subjective: Chief Complaint  Patient presents with  . Right Knee - Pain    HPI: Becky Murphy is a patient with right knee pain.  Date of injury January 2021 when she twisted her knee unloading a truck.  It has gotten worse since that time.  She did have an aspiration and injection anterior approach done about 3 weeks after injury.  The knee swelled more after that event.  She has seen Dr. Otelia Sergeant in late March where another attempted aspiration was performed.  That also was not successful and she has had progressive debilitating pain.  MRI scan was obtained and it does show diminutive medial and lateral menisci as well as mild effusion and significant synovitis within the suprapatellar pouch.  I reviewed the MRI scan myself and discussed it with radiologist this morning.  Current thinking is that this does not represent any type of neoplastic process but instead complex fluid collection with possible synovitis.  Also on my examination the menisci appear to have been significantly affected and are quite diminutive.  May also be part of that medial meniscus adjacent to the PCL..  The patient reports debilitating pain.  Denies any fevers or chills.  No prior surgery on that right knee.  She has been taking anti-inflammatories without much help.              ROS: All systems reviewed are negative as they relate to the chief complaint within the history of present illness.  Patient denies  fevers or chills.   Assessment & Plan: Visit Diagnoses:  1. Arthrofibrosis of knee joint, right     Plan: Impression is right knee pain pretty debilitating with failure of conservative management over the past 3 months.  Hard to say exactly what is within the suprapatellar  pouch region.  Does not look like PVNS or an effusion.  Some type of coagulated hematoma versus synovitis.  Nonetheless her pain is debilitating and unrelieved with conservative measures.  I do not think she is really in the boat for knee replacement.  I do think that arthroscopy and debridement and possible tissue sampling is indicated for this knee.  Again differential diagnosis would be synovitis versus complex hematoma versus PVNS which would be unlikely based on the MRI characteristics versus some other type of inflammatory process.  Risk benefits are discussed.  All questions answered  Follow-Up Instructions: No follow-ups on file.   Orders:  No orders of the defined types were placed in this encounter.  No orders of the defined types were placed in this encounter.     Procedures: No procedures performed   Clinical Data: No additional findings.  Objective: Vital Signs: There were no vitals taken for this visit.  Physical Exam:   Constitutional: Patient appears well-developed HEENT:  Head: Normocephalic Eyes:EOM are normal Neck: Normal range of motion Cardiovascular: Normal rate Pulmonary/chest: Effort normal Neurologic: Patient is alert Skin: Skin is warm Psychiatric: Patient has normal mood and affect    Ortho Exam: Ortho exam demonstrates full active and passive range of motion of the left knee.  The right knee has suprapatellar pouch fullness but is not really effusion per se.  Collaterals are stable.  Pedal pulse palpable.  Mild pain posteriorly but negative Homans' sign.  No groin pain with internal X rotation of the leg.  Extensor mechanism is intact.  Extension is more difficult for her than flexion.  Specialty Comments:  No specialty comments available.  Imaging: No results found.   PMFS History: Patient Active Problem List   Diagnosis Date Noted  . Long term (current) use of anticoagulants [Z79.01] 03/14/2016  . S/P minimally invasive mitral valve  repair 03/06/2016  . Pancreatic mass 01/29/2016  . Thyroid nodule 01/29/2016  . Exertional dyspnea 01/05/2016  . 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 Mitral insufficiency   . Mitral valve prolapse 12/26/2015  . Chest pain 11/02/2015  . Anxiety 11/02/2015   Past Medical History:  Diagnosis Date  . Anxiety   . Arthritis    "back?" (11/03/2015)  . Chest pain 11/02/2015  . Depression   . Exertional dyspnea 01/05/2016  . Heart murmur   . History of peptic ulcer "late 1970's"  . Migraine    "maybe 3-4/yr now" (11/03/2015)  . 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 Mitral valve prolapse 12/26/2015   symptomatic"MVP surgery postponed to get lesion of pancreas evaluated first".  . Pancreatic mass 01/29/2016   2.8 cm enhancing mass in the pancreatic neck and 3.1 cm low-attenuation cystic lesion in body of pancreas noted on CT angiogram  . PONV (postoperative nausea and vomiting)   . S/P minimally invasive mitral valve repair 03/06/2016   Complex valvuloplasty including triangular resection of posterior leaflet, artificial Gore-tex neochord placement x6 and 34 mm Memo 3D ring annuloplasty via right mini thoracotomy approach with clipping of LA appendage  . Thyroid nodule 01/29/2016   2.4 cm enhancing mixed cystic and solid nodule inferior left thyroid gland noted on CT angiogram    Family History  Problem Relation Age of Onset  . Hypertension Mother   . Cancer Mother 8       MELANOMA  . Cancer Father        PROSTATE  . Cancer Sister 50       BREAST    Past Surgical History:  Procedure Laterality Date  . BACK SURGERY    . BREAST BIOPSY Left ~ 2005  . BREAST LUMPECTOMY Left ~ 2005  . CARDIAC CATHETERIZATION N/A 01/16/2016   Procedure: Right/Left Heart Cath and Coronary Angiography;  Surgeon: Thurmon Fair, MD;  Location: [facility];  Service: Cardiovascular;  Laterality: N/A;  . CLIPPING OF ATRIAL APPENDAGE Left 03/06/2016   Procedure: CLIPPING OF  LEFT ATRIAL APPENDAGE using a 45 PRO2 AtriClip;  Surgeon: Purcell Nails, MD;  Location: [facility] OR;  Service: Open Heart Surgery;  Laterality: Left;  . COLONOSCOPY    . EUS N/A 02/20/2016   Procedure: ESOPHAGEAL ENDOSCOPIC ULTRASOUND (EUS) RADIAL;  Surgeon: Willis Modena, MD;  Location: [facility] ENDOS[facility];  Service: Endoscopy;  Laterality: N/A;  . EYE MUSCLE SURGERY Bilateral 1970's?  . LUMBAR DISC SURGERY  1992   "trimmed bulges off both sides"  . MITRAL VALVE REPAIR Right 03/06/2016   Procedure: MINIMALLY INVASIVE MITRAL VALVE REPAIR (MVR) using a 34 Sorin Memo 3D Ring;  Surgeon: Purcell Nails, MD;  Location: [facility] OR;  Service: Open Heart Surgery;  Laterality: Right;  . TEE WITHOUT CARDIOVERSION N/A 01/05/2016   Procedure: TRANSESOPHAGEAL ECHOCARDIOGRAM (TEE);  Surgeon: Thurmon Fair, MD;  Location: [facility] ENDOSCOPY;  Service: Cardiovascular;  Laterality: N/A;  . TEE WITHOUT CARDIOVERSION N/A 03/06/2016  Procedure: TRANSESOPHAGEAL ECHOCARDIOGRAM (TEE);  Surgeon: Rexene Alberts, MD;  Location: [facility];  Service: Open Heart Surgery;  Laterality: N/A;  . TUBAL LIGATION     Social History   Occupational History  . Not on file  Tobacco Use  . Smoking status: Never Smoker  . Smokeless tobacco: Never Used  Substance and Sexual Activity  . Alcohol use: Yes    Alcohol/week: 10.0 standard drinks    Types: 6 Glasses of wine, 4 Shots of liquor per week    Comment: occasional  . Drug use: No  . Sexual activity: Not Currently

## 2020-04-04 NOTE — Anesthesia Procedure Notes (Signed)
Procedure Name: LMA Insertion Performed by: Alease Medina, CRNA Pre-anesthesia Checklist: Patient identified, Emergency Drugs available, Suction available, Patient being monitored and Timeout performed Patient Re-evaluated:Patient Re-evaluated prior to induction Oxygen Delivery Method: Circle system utilized Preoxygenation: Pre-oxygenation with 100% oxygen Induction Type: IV induction Ventilation: Mask ventilation without difficulty LMA: LMA inserted LMA Size: 4.0 Number of attempts: 1 Tube secured with: Tape

## 2020-04-04 NOTE — Plan of Care (Signed)

## 2020-04-04 NOTE — Anesthesia Preprocedure Evaluation (Addendum)
Anesthesia Evaluation  Patient identified by MRN, date of birth, ID band Patient awake    Reviewed: Allergy & Precautions, NPO status , Patient's Chart, lab work & pertinent test results  Airway Mallampati: II  TM Distance: >3 FB Neck ROM: Full    Dental  (+) Teeth Intact, Dental Advisory Given   Pulmonary    breath sounds clear to auscultation       Cardiovascular  Rhythm:Regular Rate:Normal     Neuro/Psych    GI/Hepatic   Endo/Other    Renal/GU      Musculoskeletal   Abdominal   Peds  Hematology   Anesthesia Other Findings   Reproductive/Obstetrics                             Anesthesia Physical Anesthesia Plan  ASA: II  Anesthesia Plan: General   Post-op Pain Management:    Induction: Intravenous  PONV Risk Score and Plan: Ondansetron and Dexamethasone  Airway Management Planned: LMA  Additional Equipment:   Intra-op Plan:   Post-operative Plan: Extubation in OR  Informed Consent: I have reviewed the patients History and Physical, chart, labs and discussed the procedure including the risks, benefits and alternatives for the proposed anesthesia with the patient or authorized representative who has indicated his/her understanding and acceptance.     Dental advisory given  Plan Discussed with: CRNA and Anesthesiologist  Anesthesia Plan Comments:         Anesthesia Quick Evaluation  

## 2020-04-04 NOTE — Progress Notes (Signed)
Pharmacy Antibiotic Note  Becky Murphy is a 69 y.o. female admitted on 04/04/2020 with post-traumatic R knee synovitis.  Pharmacy has been consulted for vancomycin dosing.  Pt is S/P R knee arthroscopy with debridement this afternoon. ID has been consulted and recommended IV vancomycin plus oral ciprofloxacin for empiric treatment of presumed septic arthritis.  WBC 7.0, afebrile, Scr 0.67, CrCl 63.9 ml/min (renal function stable from last month)   Plan: Vancomycin 1500 mg IV Q 24 hrs (estimated vancomycin AUC on this regimen, using Scr 0.80, is 512.4; goal vancomycin AUC is 400-550) Monitor WBC, temp, clinical improvement, cultures, renal function, steady-state vancomycin levels  Height: 5\' 6"  (167.6 cm) Weight: 70.8 kg (156 lb) IBW/kg (Calculated) : 59.3  Temp (24hrs), Avg:98.3 F (36.8 C), Min:98 F (36.7 C), Max:98.6 F (37 C)  Recent Labs  Lab 04/04/20 1105  WBC 7.0  CREATININE 0.67    Estimated Creatinine Clearance: 63.9 mL/min (by C-G formula based on SCr of 0.67 mg/dL).    No Known Allergies  Antimicrobials this admission: 4/6 Cefazolin X 1 dose periop 4/6 Ciprofloxacin PO >>   Microbiology results: 4/6 R knee joint fluid (sample A): pending 4.6 Soft tissue cx: pending 3/25 R knee joint fluid: few PMNs, NOS; cx: NG 4/6 COVID, flu A, flu B: all negative 3/6 MRSA PCR: negative  Thank you for allowing pharmacy to be a part of this patient's care.  4/25, PharmD, BCPS, [facility] Clinical Pharmacist 04/04/2020 4:55 PM

## 2020-04-04 NOTE — Transfer of Care (Signed)
Immediate Anesthesia Transfer of Care Note  Patient: Becky Murphy  Procedure(s) Performed: RIGHT KNEE ARTHROSCOPY WITH DEBRIDEMENT (Right Knee)  Patient Location: PACU  Anesthesia Type:General and GA combined with regional for post-op pain  Level of Consciousness: drowsy and patient cooperative  Airway & Oxygen Therapy: Patient Spontanous Breathing  Post-op Assessment: Report given to RN, Post -op Vital signs reviewed and stable and Patient moving all extremities X 4  Post vital signs: Reviewed and stable  Last Vitals:  Vitals Value Taken Time  BP 127/97 04/04/20 1615  Temp    Pulse 71 04/04/20 1617  Resp 14 04/04/20 1617  SpO2 92 % 04/04/20 1617  Vitals shown include unvalidated device data.  Last Pain:  Vitals:   04/04/20 1031  TempSrc:   PainSc: 6       Patients Stated Pain Goal: 3 (04/04/20 1031)  Complications: No apparent anesthesia complications

## 2020-04-04 NOTE — Consult Note (Signed)
[facility] for Infectious Disease    Date of Admission:  04/04/2020           Reason for Consult: Posttraumatic right knee synovitis    Referring Provider: Dr. Alphonzo Severance Primary Care Provider: Dr. Claris Gower  Assessment: Her initial synovial fluid sampling in February was suggestive of infection even though cultures were negative. I am not sure if she has been on antibiotics since her injury in January that could have impacted culture results. Given that there is no more obvious explanation I agree with empiric treatment for presumed septic arthritis.  Plan: 1. IV vancomycin and oral ciprofloxacin 2. PICC placement 3. I will see her in the morning  Active Problems:   Synovitis of right knee   Scheduled Meds: . aspirin  81 mg Oral Daily  . fentaNYL      . povidone-iodine  2 application Topical Once  . tranexamic acid (CYKLOKAPRON) topical - INTRAOP  2,000 mg Topical Once   Continuous Infusions: . lactated ringers 10 mL/hr at 04/04/20 1059  . tranexamic acid     PRN Meds:.fentaNYL (SUBLIMAZE) injection, ondansetron (ZOFRAN) IV, oxyCODONE **OR** oxyCODONE  HPI: Becky Murphy is a 69 y.o. female who twisted her right knee when stepping out of the bed of a truck in January.  She had progressively more severe pain and swelling over the next month.  She saw Dr. Arelia Sneddon in February.  Records indicate that he aspirated the knee and injected steroids.  Synovial fluid white blood cell count was 34,510 with 84% neutrophils.  Synovial fluid culture was negative.  Her pain and swelling actually got worse after the injection.  She was referred to Dr. Sharyne Richters and saw him on 03/23/2020.  He aspirated the knee of about 5 cc of thin serous fluid.  The lab describes the fluid is looking orange and cloudy.  There were only 62 white blood cells of which 74% were lymphocytes.  No crystals were seen.  No organisms were seen on Gram stain and culture was negative.  Her serum uric acid  was normal.  Her sed rate and C-reactive protein were elevated.  She underwent an MRI on 03/29/2020 which showed:  IMPRESSION: 1. Diminutive medial meniscus with grade 3 oblique signal in the posterior horn approaching the inferior meniscal surface, compatible with a grade 3 oblique tear. 2. The lateral meniscus is also diminutive but not appreciably torn. 3. Large complex and heterogeneous knee effusion probably from hemarthrosis, less likely from extensive synovitis, with thickened medial plica. 4. Varying degrees of articular cartilage thinning, generally moderate, although with some full-thickness loss of articular cartilage focally in the lateral compartment posteriorly. 5. Prepatellar subcutaneous edema and suspected mild prepatellar bursitis. 6. Mild edema tracks adjacent to the MCL. This can be incidental but in the appropriate clinical circumstance could represent grade 1 sprain. 7. Low-level subcortical marrow edema posteriorly along the tibial spine. 8. Despite efforts by the technologist and patient, motion artifact is present on today's exam and could not be eliminated. This reduces exam sensitivity and specificity.  She underwent a Marcaine injection and examination on 03/30/2020.  She was admitted today and underwent arthroscopy.  Dr. Marlou Sa described finding significant synovitis.  Operative specimens including fluid and tissue were sent for stain and culture.  These are pending.   Review of Systems: Review of Systems  Unable to perform ROS: Other  Constitutional:       She is in the recovery  room    Past Medical History:  Diagnosis Date  . Anxiety   . Arthritis    "back?" (11/03/2015)  . Chest pain 11/02/2015  . Depression   . Exertional dyspnea 01/05/2016  . Heart murmur   . History of peptic ulcer "late 1970's"  . Migraine    "maybe 3-4/yr now" (11/03/2015)  . 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 Mitral valve prolapse 12/26/2015    symptomatic"MVP surgery postponed to get lesion of pancreas evaluated first".  . Pancreatic mass 01/29/2016   2.8 cm enhancing mass in the pancreatic neck and 3.1 cm low-attenuation cystic lesion in body of pancreas noted on CT angiogram  . PONV (postoperative nausea and vomiting)   . S/P minimally invasive mitral valve repair 03/06/2016   Complex valvuloplasty including triangular resection of posterior leaflet, artificial Gore-tex neochord placement x6 and 34 mm Memo 3D ring annuloplasty via right mini thoracotomy approach with clipping of LA appendage  . Thyroid nodule 01/29/2016   2.4 cm enhancing mixed cystic and solid nodule inferior left thyroid gland noted on CT angiogram    Social History   Tobacco Use  . Smoking status: Never Smoker  . Smokeless tobacco: Never Used  Substance Use Topics  . Alcohol use: Yes    Alcohol/week: 10.0 standard drinks    Types: 6 Glasses of wine, 4 Shots of liquor per week    Comment: occasional  . Drug use: No    Family History  Problem Relation Age of Onset  . Hypertension Mother   . Cancer Mother 6       MELANOMA  . Cancer Father        PROSTATE  . Cancer Sister 92       BREAST   No Known Allergies  OBJECTIVE: Blood pressure (!) 157/91, pulse 69, temperature 98 F (36.7 C), resp. rate 16, height 5\' 6"  (1.676 m), weight 70.8 kg, SpO2 100 %.  Physical Exam Constitutional:      Comments: She is still in the recovery room so I did not perform a physical examination.     Lab Results Lab Results  Component Value Date   WBC 7.0 04/04/2020   HGB 12.9 04/04/2020   HCT 40.2 04/04/2020   MCV 92.2 04/04/2020   PLT 294 04/04/2020    Lab Results  Component Value Date   CREATININE 0.67 04/04/2020   BUN 15 04/04/2020   NA 140 04/04/2020   K 3.7 04/04/2020   CL 106 04/04/2020   CO2 22 04/04/2020    Lab Results  Component Value Date   ALT 12 (L) 03/04/2016   AST 19 03/04/2016   ALKPHOS 60 03/04/2016   BILITOT 1.1 03/04/2016      Microbiology: Recent Results (from the past 240 hour(s))  Respiratory Panel by RT PCR (Flu A&B, Covid) - Nasopharyngeal Swab     Status: None   Collection Time: 04/04/20 10:08 AM   Specimen: Nasopharyngeal Swab  Result Value Ref Range Status   SARS Coronavirus 2 by RT PCR NEGATIVE NEGATIVE Final    Comment: (NOTE) SARS-CoV-2 target nucleic acids are NOT DETECTED. The SARS-CoV-2 RNA is generally detectable in upper respiratoy specimens during the acute phase of infection. The lowest concentration of SARS-CoV-2 viral copies this assay can detect is 131 copies/mL. A negative result does not preclude SARS-Cov-2 infection and should not be used as the sole basis for treatment or other patient management decisions. A negative result may occur with  improper specimen collection/handling,  submission of specimen other than nasopharyngeal swab, presence of viral mutation(s) within the areas targeted by this assay, and inadequate number of viral copies (<131 copies/mL). A negative result must be combined with clinical observations, patient history, and epidemiological information. The expected result is Negative. Fact Sheet for Patients:  https://www.moore.com/ Fact Sheet for Healthcare Providers:  https://www.young.biz/ This test is not yet ap proved or cleared by the [place] FDA and  has been authorized for detection and/or diagnosis of SARS-CoV-2 by FDA under an Emergency Use Authorization (EUA). This EUA will remain  in effect (meaning this test can be used) for the duration of the COVID-19 declaration under Section 564(b)(1) of the Act, 21 U.S.C. section 360bbb-3(b)(1), unless the authorization is terminated or revoked sooner.    Influenza A by PCR NEGATIVE NEGATIVE Final   Influenza B by PCR NEGATIVE NEGATIVE Final    Comment: (NOTE) The Xpert Xpress SARS-CoV-2/FLU/RSV assay is intended as an aid in  the diagnosis of influenza from  Nasopharyngeal swab specimens and  should not be used as a sole basis for treatment. Nasal washings and  aspirates are unacceptable for Xpert Xpress SARS-CoV-2/FLU/RSV  testing. Fact Sheet for Patients: https://www.moore.com/ Fact Sheet for Healthcare Providers: https://www.young.biz/ This test is not yet approved or cleared by the [place] FDA and  has been authorized for detection and/or diagnosis of SARS-CoV-2 by  FDA under an Emergency Use Authorization (EUA). This EUA will remain  in effect (meaning this test can be used) for the duration of the  Covid-19 declaration under Section 564(b)(1) of the Act, 21  U.S.C. section 360bbb-3(b)(1), unless the authorization is  terminated or revoked. Performed at [facility], [address], [place], [place] [address]     Cliffton Asters, MD [facility] for Infectious Disease [facility] [phone] pager   [phone] cell 04/04/2020, 4:32 PM

## 2020-04-05 DIAGNOSIS — M659 Synovitis and tenosynovitis, unspecified: Secondary | ICD-10-CM | POA: Diagnosis present

## 2020-04-05 DIAGNOSIS — M23203 Derangement of unspecified medial meniscus due to old tear or injury, right knee: Secondary | ICD-10-CM | POA: Diagnosis not present

## 2020-04-05 DIAGNOSIS — Z79899 Other long term (current) drug therapy: Secondary | ICD-10-CM | POA: Diagnosis not present

## 2020-04-05 DIAGNOSIS — Z20822 Contact with and (suspected) exposure to covid-19: Secondary | ICD-10-CM | POA: Diagnosis not present

## 2020-04-05 DIAGNOSIS — Z8249 Family history of ischemic heart disease and other diseases of the circulatory system: Secondary | ICD-10-CM | POA: Diagnosis not present

## 2020-04-05 DIAGNOSIS — Z808 Family history of malignant neoplasm of other organs or systems: Secondary | ICD-10-CM | POA: Diagnosis not present

## 2020-04-05 DIAGNOSIS — M23200 Derangement of unspecified lateral meniscus due to old tear or injury, right knee: Secondary | ICD-10-CM | POA: Diagnosis not present

## 2020-04-05 DIAGNOSIS — M009 Pyogenic arthritis, unspecified: Secondary | ICD-10-CM | POA: Diagnosis not present

## 2020-04-05 DIAGNOSIS — M1711 Unilateral primary osteoarthritis, right knee: Secondary | ICD-10-CM | POA: Diagnosis not present

## 2020-04-05 DIAGNOSIS — Z8711 Personal history of peptic ulcer disease: Secondary | ICD-10-CM | POA: Diagnosis not present

## 2020-04-05 DIAGNOSIS — Z7982 Long term (current) use of aspirin: Secondary | ICD-10-CM | POA: Diagnosis not present

## 2020-04-05 DIAGNOSIS — M65161 Other infective (teno)synovitis, right knee: Secondary | ICD-10-CM | POA: Diagnosis not present

## 2020-04-05 DIAGNOSIS — M2391 Unspecified internal derangement of right knee: Secondary | ICD-10-CM | POA: Diagnosis not present

## 2020-04-05 LAB — CBC
HCT: 39.8 % (ref 36.0–46.0)
Hemoglobin: 12.9 g/dL (ref 12.0–15.0)
MCH: 29.9 pg (ref 26.0–34.0)
MCHC: 32.4 g/dL (ref 30.0–36.0)
MCV: 92.1 fL (ref 80.0–100.0)
Platelets: 340 10*3/uL (ref 150–400)
RBC: 4.32 MIL/uL (ref 3.87–5.11)
RDW: 12.8 % (ref 11.5–15.5)
WBC: 7.4 10*3/uL (ref 4.0–10.5)
nRBC: 0 % (ref 0.0–0.2)

## 2020-04-05 MED ORDER — SODIUM CHLORIDE 0.9% FLUSH: 10.0000 mL | INTRAVENOUS | Status: DC | PRN

## 2020-04-05 MED ORDER — CHLORHEXIDINE GLUCONATE CLOTH 2 % EX PADS
6.0000 | MEDICATED_PAD | Freq: Every day | CUTANEOUS | Status: DC
  Administered 2020-04-05 – 2020-04-06 (×2): 6 via TOPICAL

## 2020-04-05 NOTE — Care Management Important Message (Cosign Needed)
Important Message  Patient Details  Name: Becky Murphy MRN: [id number] Date of Birth: February 04, 1951   Medicare Important Message Given:  Yes     Janae Bridgeman, RN 04/05/2020, 11:06 AM

## 2020-04-05 NOTE — Progress Notes (Signed)
Peripherally Inserted Central Catheter Placement  The IV Nurse has discussed with the patient and/or persons authorized to consent for the patient, the purpose of this procedure and the potential benefits and risks involved with this procedure.  The benefits include less needle sticks, lab draws from the catheter, and the patient may be discharged home with the catheter. Risks include, but not limited to, infection, bleeding, blood clot (thrombus formation), and puncture of an artery; nerve damage and irregular heartbeat and possibility to perform a PICC exchange if needed/ordered by physician.  Alternatives to this procedure were also discussed.  Bard Power PICC patient education guide, fact sheet on infection prevention and patient information card has been provided to patient /or left at bedside.    PICC Placement Documentation  PICC Single Lumen 04/05/20 PICC Right Basilic 38 cm 0 cm (Active)  Indication for Insertion or Continuance of Line Home intravenous therapies (PICC only) 04/05/20 1048  Exposed Catheter (cm) 0 cm 04/05/20 1048  Site Assessment Clean;Dry;Intact 04/05/20 1048  Line Status Flushed;Saline locked;Blood return noted 04/05/20 1048  Dressing Type Transparent;Securing device 04/05/20 1048  Dressing Status Clean;Dry;Intact;Antimicrobial disc in place 04/05/20 1048  Dressing Change Due 04/12/20 04/05/20 1048       Romie Jumper 04/05/2020, 10:51 AM

## 2020-04-05 NOTE — Progress Notes (Signed)
Patient ID: Becky Murphy, female   DOB: 1951-12-09, 70 y.o.   MRN: [id number]         [facility] for Infectious Disease  Date of Admission:  04/04/2020           Day 1 vancomycin        Day 1 ciprofloxacin ASSESSMENT: She is in agreement with empiric antibiotic therapy for presumed septic arthritis.  I have ordered PICC placement and we will treat her with at least 3 weeks of IV vancomycin and oral ciprofloxacin.  PLAN: 1. IV vancomycin and oral ciprofloxacin 2. PICC placement 3. I have arranged follow-up in my clinic and will sign off now  Diagnosis: Septic arthritis  Culture Result: Culture negative  No Known Allergies  OPAT Orders Discharge antibiotics: Per pharmacy protocol vancomycin and oral ciprofloxacin Aim for Vancomycin trough 15-20 or AUC 400-550 (unless otherwise indicated) Duration: 3 weeks End Date: 04/26/2020  [facility] Care Per Protocol:  Home health RN for IV administration and teaching; PICC line care and labs.    Labs weekly while on IV antibiotics: _x_ CBC with differential _x_ BMP __ CMP _x_ CRP _x_ ESR _x_ Vancomycin trough __ CK  _x_ Please pull PIC at completion of IV antibiotics __ Please leave PIC in place until doctor has seen patient or been notified  Fax weekly labs to [phone]  Clinic Follow Up Appt: 04/26/2020  Active Problems:   Synovitis of right knee   Scheduled Meds: . acetaminophen  1,000 mg Oral Q6H  . aspirin EC  81 mg Oral Daily  . ciprofloxacin  500 mg Oral BID  . docusate sodium  100 mg Oral BID  . tranexamic acid (CYKLOKAPRON) topical - INTRAOP  2,000 mg Topical Once   Continuous Infusions: . lactated ringers 75 mL/hr at 04/05/20 0455  . vancomycin Stopped (04/04/20 1923)   PRN Meds:.HYDROmorphone (DILAUDID) injection, metoCLOPramide **OR** metoCLOPramide (REGLAN) injection, ondansetron **OR** ondansetron (ZOFRAN) IV, oxyCODONE   SUBJECTIVE: She tells me that she has not been on any antibiotics  since she injured her right knee in January.  Review of Systems: Review of Systems  Constitutional: Negative for chills, diaphoresis and fever.  Musculoskeletal: Positive for joint pain.    No Known Allergies  OBJECTIVE: Vitals:   04/04/20 2009 04/04/20 2334 04/05/20 0437 04/05/20 0759  BP: 113/85 104/76 (!) 134/98 120/88  Pulse: 72 (!) 57 61 63  Resp: '14 16 17 16  ' Temp: 98.6 F (37 C) 97.8 F (36.6 C) 97.9 F (36.6 C) 97.8 F (36.6 C)  TempSrc: Oral Oral Oral Oral  SpO2: 94% 95% 96% 97%  Weight:      Height:       Body mass index is 25.18 kg/m.  Physical Exam Constitutional:      Comments: She is very pleasant and in no distress.  She is sitting up in a chair.  Musculoskeletal:     Comments: Right leg is in a soft brace.  She has no operative drains in place.     Lab Results Lab Results  Component Value Date   WBC 7.4 04/05/2020   HGB 12.9 04/05/2020   HCT 39.8 04/05/2020   MCV 92.1 04/05/2020   PLT 340 04/05/2020    Lab Results  Component Value Date   CREATININE 0.67 04/04/2020   BUN 15 04/04/2020   NA 140 04/04/2020   K 3.7 04/04/2020   CL 106 04/04/2020   CO2 22 04/04/2020    Lab Results  Component Value  Date   ALT 12 (L) 03/04/2016   AST 19 03/04/2016   ALKPHOS 60 03/04/2016   BILITOT 1.1 03/04/2016     Microbiology: Recent Results (from the past 240 hour(s))  Respiratory Panel by RT PCR (Flu A&B, Covid) - Nasopharyngeal Swab     Status: None   Collection Time: 04/04/20 10:08 AM   Specimen: Nasopharyngeal Swab  Result Value Ref Range Status   SARS Coronavirus 2 by RT PCR NEGATIVE NEGATIVE Final    Comment: (NOTE) SARS-CoV-2 target nucleic acids are NOT DETECTED. The SARS-CoV-2 RNA is generally detectable in upper respiratoy specimens during the acute phase of infection. The lowest concentration of SARS-CoV-2 viral copies this assay can detect is 131 copies/mL. A negative result does not preclude SARS-Cov-2 infection and should not be  used as the sole basis for treatment or other patient management decisions. A negative result may occur with  improper specimen collection/handling, submission of specimen other than nasopharyngeal swab, presence of viral mutation(s) within the areas targeted by this assay, and inadequate number of viral copies (<131 copies/mL). A negative result must be combined with clinical observations, patient history, and epidemiological information. The expected result is Negative. Fact Sheet for Patients:  PinkCheek.be Fact Sheet for Healthcare Providers:  GravelBags.it This test is not yet ap proved or cleared by the [place] FDA and  has been authorized for detection and/or diagnosis of SARS-CoV-2 by FDA under an Emergency Use Authorization (EUA). This EUA will remain  in effect (meaning this test can be used) for the duration of the COVID-19 declaration under Section 564(b)(1) of the Act, 21 U.S.C. section 360bbb-3(b)(1), unless the authorization is terminated or revoked sooner.    Influenza A by PCR NEGATIVE NEGATIVE Final   Influenza B by PCR NEGATIVE NEGATIVE Final    Comment: (NOTE) The Xpert Xpress SARS-CoV-2/FLU/RSV assay is intended as an aid in  the diagnosis of influenza from Nasopharyngeal swab specimens and  should not be used as a sole basis for treatment. Nasal washings and  aspirates are unacceptable for Xpert Xpress SARS-CoV-2/FLU/RSV  testing. Fact Sheet for Patients: PinkCheek.be Fact Sheet for Healthcare Providers: GravelBags.it This test is not yet approved or cleared by the [place] FDA and  has been authorized for detection and/or diagnosis of SARS-CoV-2 by  FDA under an Emergency Use Authorization (EUA). This EUA will remain  in effect (meaning this test can be used) for the duration of the  Covid-19 declaration under Section 564(b)(1) of the  Act, 21  U.S.C. section 360bbb-3(b)(1), unless the authorization is  terminated or revoked. Performed at [facility], [place] [address] [place], [place] [address]   Aerobic/Anaerobic Culture (surgical/deep wound)     Status: None (Preliminary result)   Collection Time: 04/04/20  2:54 PM   Specimen: Joint, Other; Body Fluid  Result Value Ref Range Status   Specimen Description JOINT FLUID RIGHT KNEE  Final   Special Requests SAMPLE A  Final   Gram Stain   Final    MODERATE WBC PRESENT,BOTH PMN AND MONONUCLEAR NO ORGANISMS SEEN Performed at [facility], [address] [place], [place] [address]    Culture PENDING  Incomplete   Report Status PENDING  Incomplete  Aerobic/Anaerobic Culture (surgical/deep wound)     Status: None (Preliminary result)   Collection Time: 04/04/20  3:11 PM   Specimen: PATH Soft tissue resection  Result Value Ref Range Status   Specimen Description TISSUE RIGHT KNEE  Final   Special Requests  SAMPLE B  Final   Gram Stain   Final    FEW WBC PRESENT,BOTH PMN AND MONONUCLEAR NO ORGANISMS SEEN Performed at [facility], [place] [address] [place], [place] [address]    Culture PENDING  Incomplete   Report Status PENDING  Incomplete    Michel Bickers, MD [facility] for Infectious [place] [facility] [phone] pager   [phone] cell 04/05/2020, 9:38 AM

## 2020-04-05 NOTE — TOC Initial Note (Addendum)
Transition of Care [facility]) - Initial/Assessment Note    Patient Details  Name: Becky Murphy MRN: [id number] Date of Birth: Dec 21, 1951  [facility] CM/SW Contact:    Curlene Labrum, RN Phone Number: 04/05/2020, 1:08 PM  Clinical Narrative:                 Case management met with the patient at the bedside regarding need for coordination of home health for PICC line and Iv Therapy.  Carolynn Sayers, RN with [facility] made aware of patient's needs for discharge planning and care of Iv antibiotics and PICC line maintenance.  Patient offered Medicare Choice and did not have a preferrence for RN to pair with IV therapy.  [place] home health RN set up for PICC line care.  PICC line was placed prior to my arrival.  Patient's family will be transporting the patient home.    Expected Discharge Plan: [place] Barriers to Discharge: No Barriers Identified   Patient Goals and CMS Choice Patient states their goals for this hospitalization and ongoing recovery are:: I am ready to get better at home, although I'm anxious to make sure I have help at home. CMS Medicare.gov Compare Post Acute Care list provided to:: Patient Choice offered to / list presented to : Patient  Expected Discharge Plan and Services Expected Discharge Plan: [place]   Discharge Planning Services: CM Consult Post Acute Care Choice: [place] arrangements for the past 2 months: Single Family Home                 DME Arranged: (currently has a rolling walker and 3:1 at home)         HH Arranged: RN, IV Antibiotics HH Agency: [facility]        Prior Living Arrangements/Services Living arrangements for the past 2 months: Single Family Home Lives with:: Spouse Patient language and need for interpreter reviewed:: Yes Do you feel safe going back to the place where you live?: Yes      Need for Family Participation in Patient Care: Yes (Comment) Care giver  support system in place?: Yes (comment)   Criminal Activity/Legal Involvement Pertinent to Current Situation/Hospitalization: No - Comment as needed  Activities of Daily Living Home Assistive Devices/Equipment: Cane (specify quad or straight), Walker (specify type) ADL Screening (condition at time of admission) Patient's cognitive ability adequate to safely complete daily activities?: Yes Is the patient deaf or have difficulty hearing?: No Does the patient have difficulty seeing, even when wearing glasses/contacts?: No Does the patient have difficulty concentrating, remembering, or making decisions?: No Patient able to express need for assistance with ADLs?: Yes Does the patient have difficulty dressing or bathing?: No Independently performs ADLs?: Yes (appropriate for developmental age) Does the patient have difficulty walking or climbing stairs?: Yes Weakness of Legs: Right Weakness of Arms/Hands: None  Permission Sought/Granted Permission sought to share information with : Case Manager Permission granted to share information with : Yes, Verbal Permission Granted     Permission granted to share info w AGENCY: [facility] - Carolynn Sayers, IV Therapy Rn coordinator        Emotional Assessment Appearance:: Appears stated age Attitude/Demeanor/Rapport: Gracious Affect (typically observed): Accepting Orientation: : Oriented to Self, Oriented to Place, Oriented to  Time, Oriented to Situation Alcohol / Substance Use: Not Applicable Psych Involvement: No (comment)  Admission diagnosis:  Synovitis of right knee [M65.9] Patient Active Problem List   Diagnosis Date Noted  .  Synovitis of right knee 04/04/2020  . Long term (current) use of anticoagulants [Z79.01] 03/14/2016  . S/P minimally invasive mitral valve repair 03/06/2016  . Pancreatic mass 01/29/2016  . Thyroid nodule 01/29/2016  . Exertional dyspnea 01/05/2016  . 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 Mitral  insufficiency   . Mitral valve prolapse 12/26/2015  . Chest pain 11/02/2015  . Anxiety 11/02/2015   PCP:  Leonard Downing, MD Pharmacy:   [id number] [place] [id number]: [phone]: [phone] [address] [address] [id number]: [phone]: [phone]    Social Determinants of Health (SDOH) Interventions    Readmission Risk Interventions No flowsheet data found.

## 2020-04-05 NOTE — Anesthesia Postprocedure Evaluation (Signed)
Anesthesia Post Note  Patient: Becky Murphy  Procedure(s) Performed: RIGHT KNEE ARTHROSCOPY WITH DEBRIDEMENT (Right Knee)     Patient location during evaluation: PACU Anesthesia Type: General Level of consciousness: awake and alert Pain management: pain level controlled Vital Signs Assessment: post-procedure vital signs reviewed and stable Respiratory status: spontaneous breathing, nonlabored ventilation, respiratory function stable and patient connected to nasal cannula oxygen Cardiovascular status: blood pressure returned to baseline and stable Postop Assessment: no apparent nausea or vomiting Anesthetic complications: no    Last Vitals:  Vitals:   04/05/20 0759 04/05/20 1324  BP: 120/88 104/80  Pulse: 63 81  Resp: 16 16  Temp: 36.6 C 36.7 C  SpO2: 97% 98%    Last Pain:  Vitals:   04/05/20 1324  TempSrc: Oral  PainSc:                  Shelton Silvas

## 2020-04-05 NOTE — Progress Notes (Signed)
PHARMACY CONSULT NOTE FOR:  OUTPATIENT  PARENTERAL ANTIBIOTIC THERAPY (OPAT)  Indication: Septic arthritis Regimen: vancomycin IV 1500 mg daily End date: 04/26/20  IV antibiotic discharge orders are pended. To discharging provider:  please sign these orders via discharge navigator,  Select New Orders & click on the button choice - Manage This Unsigned Work.     Thank you for allowing pharmacy to be a part of this patient's care.  Alvia Grove, PharmD PGY1 Acute Care Pharmacy Resident 04/05/2020, 10:48 AM

## 2020-04-06 DIAGNOSIS — M659 Synovitis and tenosynovitis, unspecified: Secondary | ICD-10-CM

## 2020-04-06 LAB — CBC
HCT: 37.4 % (ref 36.0–46.0)
Hemoglobin: 12.1 g/dL (ref 12.0–15.0)
MCH: 29.9 pg (ref 26.0–34.0)
MCHC: 32.4 g/dL (ref 30.0–36.0)
MCV: 92.3 fL (ref 80.0–100.0)
Platelets: 270 10*3/uL (ref 150–400)
RBC: 4.05 MIL/uL (ref 3.87–5.11)
RDW: 13.2 % (ref 11.5–15.5)
WBC: 8.8 10*3/uL (ref 4.0–10.5)
nRBC: 0 % (ref 0.0–0.2)

## 2020-04-06 MED ORDER — VANCOMYCIN IV (FOR PTA / DISCHARGE USE ONLY): 1500.0000 mg | INTRAVENOUS | 0 refills | Status: DC

## 2020-04-06 MED ORDER — HEPARIN SOD (PORK) LOCK FLUSH 100 UNIT/ML IV SOLN
250.0000 [IU] | INTRAVENOUS | Status: AC | PRN
  Administered 2020-04-06: 250 [IU]
  Filled 2020-04-06: qty 2.5

## 2020-04-06 MED ORDER — METHOCARBAMOL 500 MG PO TABS: 500.0000 mg | ORAL_TABLET | Freq: Three times a day (TID) | ORAL | 0 refills | Status: DC | PRN

## 2020-04-06 MED ORDER — OXYCODONE HCL 5 MG PO TABS: 5.0000 mg | ORAL_TABLET | ORAL | 0 refills | Status: DC | PRN

## 2020-04-06 NOTE — Progress Notes (Signed)
  Subjective: Patient doing well. Pain controlled. No fevers/chills/constitutional symptoms.     Objective: Vital signs in last 24 hours: Temp:  [97.8 F (36.6 C)-98.5 F (36.9 C)] 98.5 F (36.9 C) (04/08 0441) Pulse Rate:  [63-81] 76 (04/08 0441) Resp:  [16-17] 17 (04/08 0441) BP: (104-153)/(80-98) 153/98 (04/08 0441) SpO2:  [95 %-98 %] 98 % (04/08 0441)  Intake/Output from previous day: 04/07 0701 - 04/08 0700 In: 840 [P.O.:840] Out: -  Intake/Output this shift: No intake/output data recorded.  Exam:  Postop dressings in place.  2+ DP pulse of RLE Sensation intact to Right foot DF/PF intact to right ankle   Labs: Recent Labs    04/04/20 1105 04/05/20 0140 04/06/20 0406  HGB 12.9 12.9 12.1   Recent Labs    04/05/20 0140 04/06/20 0406  WBC 7.4 8.8  RBC 4.32 4.05  HCT 39.8 37.4  PLT 340 270   Recent Labs    04/04/20 1105  NA 140  K 3.7  CL 106  CO2 22  BUN 15  CREATININE 0.67  GLUCOSE 99  CALCIUM 9.0   No results for input(s): LABPT, INR in the last 72 hours.  Assessment/Plan: Patient is POD2 s/p right knee arthroscopy with irrigation and debridement.    -PICC line in place  -Plan for IV abx treatment over next 3 weeks  -Follow-up with Dr. August Saucer next week  -Discharge home today   Becky Murphy 04/06/2020, 7:16 AM

## 2020-04-06 NOTE — Progress Notes (Signed)
Patient discharging home. Discharge instructions explained to patient and she verbalized understanding. Packed all personal belongings. No further question or concerns voiced.

## 2020-04-06 NOTE — Care Management Important Message (Signed)
Important Message  Patient Details  Name: Becky Murphy MRN: [id number] Date of Birth: [birth year]   Medicare Important Message Given:  Yes Patient left prior to Im being delivered.  Im Mailed to the patient address.     Blayn Whetsell 04/06/2020, 11:46 AM

## 2020-04-07 ENCOUNTER — Telehealth: Payer: Self-pay | Admitting: Radiology

## 2020-04-07 NOTE — Telephone Encounter (Signed)
Becky Murphy called in regards to post op dressing care. Advised dressing on patient can stay on until post op visit with Dr. August Saucer, patient is being seen 4/14.   Advised if dressing becomes soiled or damaged to replace with dry dressing.

## 2020-04-09 LAB — AEROBIC/ANAEROBIC CULTURE W GRAM STAIN (SURGICAL/DEEP WOUND)
Culture: NO GROWTH
Culture: NO GROWTH

## 2020-04-10 ENCOUNTER — Encounter: Payer: Self-pay | Admitting: Internal Medicine

## 2020-04-12 ENCOUNTER — Other Ambulatory Visit: Payer: Self-pay

## 2020-04-12 ENCOUNTER — Ambulatory Visit (INDEPENDENT_AMBULATORY_CARE_PROVIDER_SITE_OTHER): Payer: Medicare Other | Admitting: Orthopedic Surgery

## 2020-04-12 DIAGNOSIS — M24661 Ankylosis, right knee: Secondary | ICD-10-CM

## 2020-04-12 MED ORDER — OXYCODONE HCL 5 MG PO TABS: 5.0000 mg | ORAL_TABLET | Freq: Three times a day (TID) | ORAL | 0 refills | Status: DC | PRN

## 2020-04-12 NOTE — Discharge Summary (Signed)
Physician Discharge Summary      Patient ID: Becky Murphy MRN: [id number] DOB/AGE: 04/19/1951 69 y.o.  Admit date: 04/04/2020 Discharge date: 04/06/2020  Admission Diagnoses:  Active Problems:   Synovitis of right knee   Discharge Diagnoses:  Same  Surgeries: Procedure(s): RIGHT KNEE ARTHROSCOPY WITH DEBRIDEMENT on 04/04/2020   Consultants:   Discharged Condition: Stable  Hospital Course: Becky Murphy is an 69 y.o. female who was admitted 04/04/2020 with a chief complaint of right knee pain, and found to have a diagnosis of right knee septic arthritis.  They were brought to the operating room on 04/04/2020 and underwent the above named procedures.  Pt awoke from anesthesia without complication and was transferred to the floor. On POD1, patient's pain was controlled but she had difficulty finding assistance with going home and caring for herself at home.  She denied constitutional symptoms throughout her stay.  She found help from her daughter who picked her up on POD2.  She was discharged home on POD2.  Pt will f/u with Dr. Marlou Sa in clinic in ~2 weeks.   Antibiotics given:  Anti-infectives (From admission, onward)   Start     Dose/Rate Route Frequency Ordered Stop   04/06/20 0000  vancomycin IVPB     1,500 mg Intravenous Every 24 hours 04/06/20 0730 04/27/20 2359   04/04/20 2000  ciprofloxacin (CIPRO) tablet 500 mg  Status:  Discontinued     500 mg Oral 2 times daily 04/04/20 1651 04/06/20 1519   04/04/20 1730  vancomycin (VANCOREADY) IVPB 1500 mg/300 mL  Status:  Discontinued     1,500 mg 150 mL/hr over 120 Minutes Intravenous Every 24 hours 04/04/20 1708 04/06/20 1519   04/04/20 1715  vancomycin (VANCOCIN) IVPB 1000 mg/200 mL premix  Status:  Discontinued     1,000 mg 200 mL/hr over 60 Minutes Intravenous Every 12 hours 04/04/20 1714 04/04/20 1735   04/04/20 1528  vancomycin (VANCOCIN) powder  Status:  Discontinued       As needed 04/04/20 1528 04/04/20 1612   04/04/20 1527   gentamicin (GARAMYCIN) injection  Status:  Discontinued       As needed 04/04/20 1527 04/04/20 1612   04/04/20 1000  ceFAZolin (ANCEF) IVPB 2g/100 mL premix     2 g 200 mL/hr over 30 Minutes Intravenous On call to O.R. 04/04/20 0955 04/04/20 1435   04/04/20 0959  ceFAZolin (ANCEF) 2-4 GM/100ML-% IVPB    Note to Pharmacy: Grace Blight   : cabinet override      04/04/20 0959 04/04/20 1441    .  Recent vital signs:  Vitals:   04/06/20 0441 04/06/20 0900  BP: (!) 153/98   Pulse: 76 80  Resp: 17 18  Temp: 98.5 F (36.9 C) 98.7 F (37.1 C)  SpO2: 98% 96%    Recent laboratory studies:  Results for orders placed or performed during the hospital encounter of 04/04/20  Respiratory Panel by RT PCR (Flu A&B, Covid) - Nasopharyngeal Swab   Specimen: Nasopharyngeal Swab  Result Value Ref Range   SARS Coronavirus 2 by RT PCR NEGATIVE NEGATIVE   Influenza A by PCR NEGATIVE NEGATIVE   Influenza B by PCR NEGATIVE NEGATIVE  Aerobic/Anaerobic Culture (surgical/deep wound)   Specimen: Joint, Other; Body Fluid  Result Value Ref Range   Specimen Description JOINT FLUID RIGHT KNEE    Special Requests SAMPLE A    Gram Stain      MODERATE WBC PRESENT,BOTH PMN AND MONONUCLEAR NO ORGANISMS SEEN  Culture      No growth aerobically or anaerobically. Performed at [facility], [place] [address] [place], [place] [address]    Report Status 04/09/2020 FINAL   Aerobic/Anaerobic Culture (surgical/deep wound)   Specimen: PATH Soft tissue resection  Result Value Ref Range   Specimen Description TISSUE RIGHT KNEE    Special Requests SAMPLE B    Gram Stain      FEW WBC PRESENT,BOTH PMN AND MONONUCLEAR NO ORGANISMS SEEN    Culture      No growth aerobically or anaerobically. Performed at [facility], [place] [address] [place], [place] [address]    Report Status 04/09/2020 FINAL   CBC  Result Value Ref Range   WBC 7.0 4.0 - 10.5 K/uL   RBC 4.36 3.87 - 5.11 MIL/uL   Hemoglobin 12.9  12.0 - 15.0 g/dL   HCT 40.2 36.0 - 46.0 %   MCV 92.2 80.0 - 100.0 fL   MCH 29.6 26.0 - 34.0 pg   MCHC 32.1 30.0 - 36.0 g/dL   RDW 12.9 11.5 - 15.5 %   Platelets 294 150 - 400 K/uL   nRBC 0.0 0.0 - 0.2 %  Basic metabolic panel  Result Value Ref Range   Sodium 140 135 - 145 mmol/L   Potassium 3.7 3.5 - 5.1 mmol/L   Chloride 106 98 - 111 mmol/L   CO2 22 22 - 32 mmol/L   Glucose, Bld 99 70 - 99 mg/dL   BUN 15 8 - 23 mg/dL   Creatinine, Ser 0.67 0.44 - 1.00 mg/dL   Calcium 9.0 8.9 - 10.3 mg/dL   GFR calc non Af Amer >60 >60 mL/min   GFR calc Af Amer >60 >60 mL/min   Anion gap 12 5 - 15  CBC  Result Value Ref Range   WBC 7.4 4.0 - 10.5 K/uL   RBC 4.32 3.87 - 5.11 MIL/uL   Hemoglobin 12.9 12.0 - 15.0 g/dL   HCT 39.8 36.0 - 46.0 %   MCV 92.1 80.0 - 100.0 fL   MCH 29.9 26.0 - 34.0 pg   MCHC 32.4 30.0 - 36.0 g/dL   RDW 12.8 11.5 - 15.5 %   Platelets 340 150 - 400 K/uL   nRBC 0.0 0.0 - 0.2 %  CBC  Result Value Ref Range   WBC 8.8 4.0 - 10.5 K/uL   RBC 4.05 3.87 - 5.11 MIL/uL   Hemoglobin 12.1 12.0 - 15.0 g/dL   HCT 37.4 36.0 - 46.0 %   MCV 92.3 80.0 - 100.0 fL   MCH 29.9 26.0 - 34.0 pg   MCHC 32.4 30.0 - 36.0 g/dL   RDW 13.2 11.5 - 15.5 %   Platelets 270 150 - 400 K/uL   nRBC 0.0 0.0 - 0.2 %    Discharge Medications:   Allergies as of 04/06/2020   No Known Allergies     Medication List    STOP taking these medications   oxycodone 5 MG capsule Commonly known as: OXY-IR     TAKE these medications   aspirin 81 MG EC tablet Take 1 tablet (81 mg total) by mouth daily.   cholecalciferol 25 MCG (1000 UNIT) tablet Commonly known as: VITAMIN D3 Take 1,000 Units by mouth daily.   DIGESTIVE ENZYME PO Take 1 capsule by mouth daily.   ibuprofen 800 MG tablet Commonly known as: ADVIL Take 1 tablet (800 mg total) by mouth every 8 (eight) hours as needed. What changed: reasons  to take this   methocarbamol 500 MG tablet Commonly known as: Robaxin Take 1 tablet (500 mg  total) by mouth every 8 (eight) hours as needed.   vancomycin  IVPB Inject 1,500 mg into the vein daily for 21 days. Indication: Septic arthritis Last Day of Therapy:  04/26/20 Labs - Sunday/Monday:  CBC/D, BMP, and vancomycin trough. Labs - Thursday:  BMP and vancomycin trough Labs - Every other week:  ESR and CRP            Home Infusion Instuctions  (From admission, onward)         Start     Ordered   04/06/20 0000  Home infusion instructions    Question:  Instructions  Answer:  Flushing of vascular access device: 0.9% NaCl pre/post medication administration and prn patency; Heparin 100 u/ml, 52m for implanted ports and Heparin 10u/ml, 541mfor all other central venous catheters.   04/06/20 0730          Diagnostic Studies: MR Knee Right w/o contrast  Result Date: 03/29/2020 CLINICAL DATA:  Twisting injury of the right knee 2 months ago with continued pain, reduced range of motion, and knee effusion. EXAM: MRI OF THE RIGHT KNEE WITHOUT CONTRAST TECHNIQUE: Multiplanar, multisequence MR imaging of the knee was performed. No intravenous contrast was administered. COMPARISON:  Radiographs 03/23/2020 FINDINGS: Despite efforts by the technologist and patient, motion artifact is present on today's exam and could not be eliminated. This reduces exam sensitivity and specificity. MENISCI Medial meniscus: Diminutive medial meniscus with grade 3 oblique signal in the posterior horn approaching the midbody for example on image 12/9. This grade 3 signal extends to the inferior meniscal surface. There is abnormal grade 3 signal extending along the inferior surface of the posterior horn on image 8/11. I do not see an obvious bucket-handle tear to further explain the very small size of the medial meniscus. Lateral meniscus: Very diminutive size of the lateral meniscus, with mild blunting of the free edge of in the midbody and only a thin rim of residual tissue posteriorly for example on image 22/11.  Has the patient undergone prior meniscectomies? Unlike in the medial meniscus, there is no clearly defined surface tear. LIGAMENTS Cruciates:  Unremarkable Collaterals: Mild edema tracks adjacent to the MCL. This can be incidental but in the appropriate clinical circumstance could represent grade 1 sprain. CARTILAGE Patellofemoral: Moderate degenerative chondral thinning in the patella with marginal spurring. Medial: Moderate chondral thinning with marginal spurring. Chondral fissuring in the medial femoral condyle articular surface posteriorly on images 12-13 of series 10. Small focus of subcortical marrow edema along the posteromedial rim of the tibial plateau. Lateral: Large foci of full-thickness articular cartilage loss centrally and posteriorly along the lateral femoral condyle and lateral tibial plateau as shown for example on image 12/10. Otherwise moderate chondral thinning. Mild marginal spurring. Small amount of degenerative subcortical marrow edema along the proximal tibial rim. Joint: Large complex and heterogeneous knee effusion pop possibly favoring a large hemarthrosis, but possibly from synovitis. Thickened medial plica. Popliteal Fossa: Low-level infiltrative edema in the popliteal space. Extensor Mechanism: Prepatellar subcutaneous edema and suspected mild prepatellar bursitis on image 15/11. Bones: Low-level subcortical marrow edema posteriorly along the tibial spine. Other: No supplemental non-categorized findings. IMPRESSION: 1. Diminutive medial meniscus with grade 3 oblique signal in the posterior horn approaching the inferior meniscal surface, compatible with a grade 3 oblique tear. 2. The lateral meniscus is also diminutive but not appreciably torn. 3. Large complex and heterogeneous  knee effusion probably from hemarthrosis, less likely from extensive synovitis, with thickened medial plica. 4. Varying degrees of articular cartilage thinning, generally moderate, although with some  full-thickness loss of articular cartilage focally in the lateral compartment posteriorly. 5. Prepatellar subcutaneous edema and suspected mild prepatellar bursitis. 6. Mild edema tracks adjacent to the MCL. This can be incidental but in the appropriate clinical circumstance could represent grade 1 sprain. 7. Low-level subcortical marrow edema posteriorly along the tibial spine. 8. Despite efforts by the technologist and patient, motion artifact is present on today's exam and could not be eliminated. This reduces exam sensitivity and specificity. Electronically Signed   By: Van Clines M.D.   On: 03/29/2020 18:06   XR Knee 1-2 Views Right  Result Date: 03/23/2020 AP and lateral radiographs right knee with right knee intraarticular effusion with suprapatella component greater than periarticular, the suprapatella fluid collection extends to the subcutaneous ft lay and is suspicious for possible distal quadriceps tendon injury or rupture. There is an extremely thin lucent line transverse through the superior pole of the patella suggestive of a nondisplace superior pole patella fracture. No patella alta noted, Lateral joint line is narrowed and there is retropatella degenerative changes with an irregular contour and retropatella scalloping, minimal superior pole patella spur and inferior pole spur. Mild lateral joint and patellofemoral osteoarthritis with suprapatella effusion.   [place] EKG SITE RITE  Result Date: 04/04/2020 If Site Rite image not attached, placement could not be confirmed due to current cardiac rhythm.   Disposition: Discharge disposition: 01-Home or Self Care       Discharge Instructions    Call MD / Call 911   Complete by: As directed    If you experience chest pain or shortness of breath, CALL 911 and be transported to the hospital emergency room.  If you develope a fever above 101 F, pus (white drainage) or increased drainage or redness at the wound, or calf pain, call your  surgeon's office.   Constipation Prevention   Complete by: As directed    Drink plenty of fluids.  Prune juice may be helpful.  You may use a stool softener, such as Colace (over the counter) 100 mg twice a day.  Use MiraLax (over the counter) for constipation as needed.   Diet - low sodium heart healthy   Complete by: As directed    Discharge instructions   Complete by: As directed    Weight bear as tolerated on the operative leg.  Recommend use of cane/walker with knee immobilizer in first several days as the quadriceps function returns.  Dressings are waterproof, you may shower.  No bath, pool, hot tub.  Follow-up with Dr. Marlou Sa or Lurena Joiner on your given appointment date.  Call the office at [phone] with any questions/concerns   Home infusion instructions   Complete by: As directed    Instructions: Flushing of vascular access device: 0.9% NaCl pre/post medication administration and prn patency; Heparin 100 u/ml, 62m for implanted ports and Heparin 10u/ml, 560mfor all other central venous catheters.   Increase activity slowly as tolerated   Complete by: As directed       Follow-up Information    [facility] Follow up.   Why: [facility] will be coordinating your home health IV infusion services.  If you need to reach them call [phone] assistance.       [facility]   Specialty: Home Health Services Why: [facility] for PICC  line care. Contact information: [address] [place]  [id number] [phone]            Signed: Donella Stade 04/12/2020, 9:32 PM

## 2020-04-13 ENCOUNTER — Telehealth: Payer: Self-pay | Admitting: Orthopedic Surgery

## 2020-04-13 ENCOUNTER — Ambulatory Visit (HOSPITAL_COMMUNITY)
Admission: RE | Admit: 2020-04-13 | Discharge: 2020-04-13 | Disposition: A | Discharge status: Home / Self Care | Payer: Medicare Other | Source: Ambulatory Visit | Attending: Orthopedic Surgery | Admitting: Orthopedic Surgery

## 2020-04-13 ENCOUNTER — Other Ambulatory Visit: Payer: Self-pay | Admitting: Radiology

## 2020-04-13 ENCOUNTER — Encounter: Payer: Self-pay | Admitting: Internal Medicine

## 2020-04-13 ENCOUNTER — Telehealth: Payer: Self-pay | Admitting: *Deleted

## 2020-04-13 DIAGNOSIS — M24661 Ankylosis, right knee: Secondary | ICD-10-CM | POA: Insufficient documentation

## 2020-04-13 NOTE — Telephone Encounter (Signed)
I faxed this information to [facility]. Patient states she was advised not to do outpatient therapy while she was receiving IV abx  Can you please document on referral Faxed to [phone] Patient advised done.

## 2020-04-13 NOTE — Telephone Encounter (Signed)
Patient would like a PT RX for in home PT faxed to [facility]. [phone]

## 2020-04-13 NOTE — Telephone Encounter (Signed)
Plan was to start outpatient PT, I think that would do better for her.  Is she against the idea of outpatient PT?

## 2020-04-13 NOTE — Progress Notes (Signed)
Lower extremity venous has been completed.   Preliminary results in CV Proc.   Blanch Media 04/13/2020 10:57 AM

## 2020-04-13 NOTE — Telephone Encounter (Signed)
NOTED

## 2020-04-13 NOTE — Telephone Encounter (Signed)
Pls advise. Thanks.  

## 2020-04-13 NOTE — Telephone Encounter (Signed)
Pt has appt today at [facility] heart for [place] (DVT) at [facility] at 11am, pt is aware of appt

## 2020-04-14 ENCOUNTER — Encounter: Payer: Self-pay | Admitting: Orthopedic Surgery

## 2020-04-14 NOTE — Progress Notes (Signed)
Post-Op Visit Note   Patient: Becky Murphy           Date of Birth: [birth year]           MRN: [id number] Visit Date: 04/12/2020 PCP: Kaleen Mask, MD   Assessment & Plan:  Chief Complaint:  Chief Complaint  Patient presents with  . Right Knee - Routine Post Op   Visit Diagnoses:  1. Arthrofibrosis of knee joint, right     Plan: Patient is a 69 year old female who presents s/p right knee arthroscopy with irrigation and debridement on 04/04/2020.  Patient notes that she is doing okay and her pain is improving slowly.  She is still unable to weight-bear without the assistance of a cane or walker.  She has been taking ibuprofen and oxycodone 5 mg for pain control.  She is receiving IV antibiotics daily through her PICC line.  She notes slow progression from intense sharp pain in the knee to a more dull achy pain.  She has 10 degrees of extension and 90 degrees of flexion.  Denies any fevers, chills, night sweats, malaise.  She does have a warm knee but notes that this is better than preoperatively.  Cultures and pathology taken from the procedure have resulted to be negative.  She follows up with infectious disease on 04/26/2020.  Refilled oxycodone.  She does have some calf tenderness on exam so ordered an ultrasound of the right lower extremity to be done this week to rule out deep vein thrombosis.  Follow-Up Instructions: No follow-ups on file.   Orders:  Orders Placed This Encounter  Procedures  . VAS [place] LOWER EXTREMITY VENOUS (DVT)   Meds ordered this encounter  Medications  . oxyCODONE (OXY IR/ROXICODONE) 5 MG immediate release tablet    Sig: Take 1 tablet (5 mg total) by mouth every 8 (eight) hours as needed for moderate pain (pain score 4-6).    Dispense:  30 tablet    Refill:  0    Imaging: VAS [place] LOWER EXTREMITY VENOUS (DVT)  Result Date: 04/13/2020  Lower Venous DVTStudy Indications: Swelling, and Pain.  Comparison Study: no prior Performing Technologist:  Blanch Media RVS  Examination Guidelines: A complete evaluation includes B-mode imaging, spectral Doppler, color Doppler, and power Doppler as needed of all accessible portions of each vessel. Bilateral testing is considered an integral part of a complete examination. Limited examinations for reoccurring indications may be performed as noted. The reflux portion of the exam is performed with the patient in reverse Trendelenburg.  +---------+---------------+---------+-----------+----------+--------------+ RIGHT    CompressibilityPhasicitySpontaneityPropertiesThrombus Aging +---------+---------------+---------+-----------+----------+--------------+ CFV      Full           Yes      Yes                                 +---------+---------------+---------+-----------+----------+--------------+ SFJ      Full                                                        +---------+---------------+---------+-----------+----------+--------------+ FV Prox  Full                                                        +---------+---------------+---------+-----------+----------+--------------+  FV Mid   Full                                                        +---------+---------------+---------+-----------+----------+--------------+ FV DistalFull                                                        +---------+---------------+---------+-----------+----------+--------------+ PFV      Full                                                        +---------+---------------+---------+-----------+----------+--------------+ POP      Full           Yes      Yes                                 +---------+---------------+---------+-----------+----------+--------------+ PTV      Full                                                        +---------+---------------+---------+-----------+----------+--------------+ PERO     Full                                                         +---------+---------------+---------+-----------+----------+--------------+   +----+---------------+---------+-----------+----------+--------------+ LEFTCompressibilityPhasicitySpontaneityPropertiesThrombus Aging +----+---------------+---------+-----------+----------+--------------+ CFV Full           Yes      Yes                                 +----+---------------+---------+-----------+----------+--------------+     Summary: RIGHT: - There is no evidence of deep vein thrombosis in the lower extremity.  - No cystic structure found in the popliteal fossa.  LEFT: - No evidence of common femoral vein obstruction.  *See table(s) above for measurements and observations. Electronically signed by Lemar Livings MD on 04/13/2020 at 4:18:41 PM.    Final     PMFS History: Patient Active Problem List   Diagnosis Date Noted  . Synovitis of right knee 04/04/2020  . Long term (current) use of anticoagulants [Z79.01] 03/14/2016  . S/P minimally invasive mitral valve repair 03/06/2016  . Pancreatic mass 01/29/2016  . Thyroid nodule 01/29/2016  . Exertional dyspnea 01/05/2016  . 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 Mitral insufficiency   . Mitral valve prolapse 12/26/2015  . Chest pain 11/02/2015  . Anxiety 11/02/2015   Past Medical History:  Diagnosis Date  . Anxiety   . Arthritis    "back?" (11/03/2015)  . Chest pain 11/02/2015  . Depression   .  Exertional dyspnea 01/05/2016  . Heart murmur   . History of peptic ulcer "late 1970's"  . Migraine    "maybe 3-4/yr now" (11/03/2015)  . 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 Mitral valve prolapse 12/26/2015   symptomatic"MVP surgery postponed to get lesion of pancreas evaluated first".  . Pancreatic mass 01/29/2016   2.8 cm enhancing mass in the pancreatic neck and 3.1 cm low-attenuation cystic lesion in body of pancreas noted on CT angiogram  . PONV (postoperative nausea and vomiting)   . S/P minimally  invasive mitral valve repair 03/06/2016   Complex valvuloplasty including triangular resection of posterior leaflet, artificial Gore-tex neochord placement x6 and 34 mm Memo 3D ring annuloplasty via right mini thoracotomy approach with clipping of LA appendage  . Thyroid nodule 01/29/2016   2.4 cm enhancing mixed cystic and solid nodule inferior left thyroid gland noted on CT angiogram    Family History  Problem Relation Age of Onset  . Hypertension Mother   . Cancer Mother 52       MELANOMA  . Cancer Father        PROSTATE  . Cancer Sister 40       BREAST    Past Surgical History:  Procedure Laterality Date  . BACK SURGERY    . BREAST BIOPSY Left ~ 2005  . BREAST LUMPECTOMY Left ~ 2005  . CARDIAC CATHETERIZATION N/A 01/16/2016   Procedure: Right/Left Heart Cath and Coronary Angiography;  Surgeon: Sanda Klein, MD;  Location: [facility];  Service: Cardiovascular;  Laterality: N/A;  . CLIPPING OF ATRIAL APPENDAGE Left 03/06/2016   Procedure: CLIPPING OF LEFT ATRIAL APPENDAGE using a 34 PRO2 AtriClip;  Surgeon: Rexene Alberts, MD;  Location: [facility];  Service: Open Heart Surgery;  Laterality: Left;  . COLONOSCOPY    . EUS N/A 02/20/2016   Procedure: ESOPHAGEAL ENDOSCOPIC ULTRASOUND (EUS) RADIAL;  Surgeon: Arta Silence, MD;  Location: [facility];  Service: Endoscopy;  Laterality: N/A;  . EYE MUSCLE SURGERY Bilateral 1970's?  Marland Kitchen KNEE ARTHROSCOPY Right 04/04/2020   Procedure: RIGHT KNEE ARTHROSCOPY WITH DEBRIDEMENT;  Surgeon: Meredith Pel, MD;  Location: [place];  Service: Orthopedics;  Laterality: Right;  . [place]   "trimmed bulges off both sides"  . MITRAL VALVE REPAIR Right 03/06/2016   Procedure: MINIMALLY INVASIVE MITRAL VALVE REPAIR (MVR) using a 34 Sorin Memo 3D Ring;  Surgeon: Rexene Alberts, MD;  Location: [place];  Service: Open Heart Surgery;  Laterality: Right;  . TEE WITHOUT CARDIOVERSION N/A 01/05/2016   Procedure: TRANSESOPHAGEAL ECHOCARDIOGRAM  (TEE);  Surgeon: Sanda Klein, MD;  Location[facility];  Service: Cardiovascular;  Laterality: N/A;  . TEE WITHOUT CARDIOVERSION N/A 03/06/2016   Procedure: TRANSESOPHAGEAL ECHOCARDIOGRAM (TEE);  Surgeon: Rexene Alberts, MD;  Location: [place];  Service: Open Heart Surgery;  Laterality: N/A;  . TUBAL LIGATION     Social History   Occupational History  . Not on file  Tobacco Use  . Smoking status: Never Smoker  . Smokeless tobacco: Never Used  Substance and Sexual Activity  . Alcohol use: Yes    Alcohol/week: 10.0 standard drinks    Types: 6 Glasses of wine, 4 Shots of liquor per week    Comment: occasional  . Drug use: No  . Sexual activity: Not Currently

## 2020-04-17 ENCOUNTER — Telehealth: Payer: Self-pay

## 2020-04-17 NOTE — Telephone Encounter (Signed)
Please tell her that I have not certain what is causing the swelling and redness but that I doubt it is related to her knee infection or antibiotics.  If she would like to move her appointment date up I have several open B20 slots on Wednesday and Thursday of this week.

## 2020-04-17 NOTE — Telephone Encounter (Signed)
Corrie Dandy , Pharmacist with [facility] calling with nurse reporting patient has raised area in front of her right ear . Notice on Saturday and it has increased in size and hardened  Her vancomycin was increased from every 24 hours to 1500 mgs every 12 on March 14, 2020.  She has been taking Vancomycin since April, 2021.   Picture sent to secure email.   Please advise   Laurell Josephs, RN

## 2020-04-17 NOTE — Telephone Encounter (Signed)
Received call from patient's primary care provider requesting to speak with Dr. Orvan Falconer about patient. Informed him that Dr. Orvan Falconer was with patients and unavailable to speak. Dr. Jeannetta Nap requested Dr. Orvan Falconer return his call when available; believes patient could have Lyme disease.   Becky Dobesh Loyola Mast, RN

## 2020-04-18 ENCOUNTER — Encounter: Payer: Self-pay | Admitting: Internal Medicine

## 2020-04-18 NOTE — Telephone Encounter (Signed)
Received call from [place] at [facility] to report an additional "lump" that showed up near her right ear; reporting tenderness. Home health RN reported that the patient is not in any acute distress. Informed Larita Fife that RN would forward message to provider and reiterated that he didn't believe the previous lump was related to her medication. Patient is scheduled to see Dr. Orvan Falconer this week.   Shamar Kracke [facility] Mast, RN

## 2020-04-18 NOTE — Telephone Encounter (Signed)
Larita Fife, Pharmacist with [facility] called office see if provider would be making any changes to patient's IV regimen. Do not see any notes regarding change in therapy. Will forward message MD to advise. Lorenso Courier, [place]

## 2020-04-18 NOTE — Telephone Encounter (Signed)
I will not make any changes to her antibiotic regimen at this time.  I will reevaluate this when she comes in 04/20/2020.

## 2020-04-20 ENCOUNTER — Encounter: Payer: Self-pay | Admitting: Internal Medicine

## 2020-04-20 ENCOUNTER — Other Ambulatory Visit: Payer: Self-pay

## 2020-04-20 ENCOUNTER — Ambulatory Visit (INDEPENDENT_AMBULATORY_CARE_PROVIDER_SITE_OTHER): Payer: Medicare Other | Admitting: Internal Medicine

## 2020-04-20 DIAGNOSIS — M659 Synovitis and tenosynovitis, unspecified: Secondary | ICD-10-CM

## 2020-04-20 NOTE — Assessment & Plan Note (Signed)
Her right knee synovitis is improving following recent surgery and a little over 2 weeks of IV vancomycin.  I will continue vancomycin alone and see her back in 6 days.  I do not know the cause of her tender nodule/adenopathy.  I do not think that it is related to her cellulitis or to vancomycin.  I will reevaluate her when she follows up next week.

## 2020-04-20 NOTE — Progress Notes (Signed)
[facility]  Patient Active Problem List   Diagnosis Date Noted  . Synovitis of right knee 04/04/2020    Priority: High  . Long term (current) use of anticoagulants [Z79.01] 03/14/2016  . S/P minimally invasive mitral valve repair 03/06/2016  . Pancreatic mass 01/29/2016  . Thyroid nodule 01/29/2016  . Exertional dyspnea 01/05/2016  . 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 Mitral insufficiency   . Mitral valve prolapse 12/26/2015  . Chest pain 11/02/2015  . Anxiety 11/02/2015    Patient's Medications  New Prescriptions   No medications on file  Previous Medications   ASPIRIN 81 MG EC TABLET    Take 1 tablet (81 mg total) by mouth daily.   CHOLECALCIFEROL (VITAMIN D3) 25 MCG (1000 UNIT) TABLET    Take 1,000 Units by mouth daily.   DIGESTIVE ENZYMES (DIGESTIVE ENZYME PO)    Take 1 capsule by mouth daily.   IBUPROFEN (ADVIL) 800 MG TABLET    Take 1 tablet (800 mg total) by mouth every 8 (eight) hours as needed.   METHOCARBAMOL (ROBAXIN) 500 MG TABLET    Take 1 tablet (500 mg total) by mouth every 8 (eight) hours as needed.   OXYCODONE (OXY IR/ROXICODONE) 5 MG IMMEDIATE RELEASE TABLET    Take 1 tablet (5 mg total) by mouth every 8 (eight) hours as needed for moderate pain (pain score 4-6).   VANCOMYCIN IVPB    Inject 1,500 mg into the vein daily for 21 days. Indication: Septic arthritis Last Day of Therapy:  04/26/20 Labs - Sunday/Monday:  CBC/D, BMP, and vancomycin trough. Labs - Thursday:  BMP and vancomycin trough Labs - Every other week:  ESR and CRP  Modified Medications   No medications on file  Discontinued Medications   No medications on file    Subjective: Becky Murphy is in for her hospital follow-up visit.  She twisted her right knee when stepping out of the bed of a truck in January.  She had progressively more severe pain and swelling over the next month.  She saw Dr. Arelia Sneddon in February.  Records indicate that he  aspirated the knee and injected steroids.  Synovial fluid white blood cell count was 34,510 with 84% neutrophils.  Synovial fluid culture was negative.  Her pain and swelling actually got worse after the injection.  She was referred to Dr. Sharyne Richters and saw him on 03/23/2020.  He aspirated the knee of about 5 cc of thin serous fluid.  The lab describes the fluid is looking orange and cloudy.  There were only 62 white blood cells of which 74% were lymphocytes.  No crystals were seen.  No organisms were seen on Gram stain and culture was negative.  Her serum uric acid was normal.  Her sed rate and C-reactive protein were elevated.  She underwent an MRI on 03/29/2020 which showed:  IMPRESSION: 1. Diminutive medial meniscus with grade 3 oblique signal in the posterior horn approaching the inferior meniscal surface, compatible with a grade 3 oblique tear. 2. The lateral meniscus is also diminutive but not appreciably torn. 3. Large complex and heterogeneous knee effusion probably from hemarthrosis, less likely from extensive synovitis, with thickened medial plica. 4. Varying degrees of articular cartilage thinning, generally moderate, although with some full-thickness loss of articular cartilage focally in the lateral compartment posteriorly. 5. Prepatellar subcutaneous edema and suspected mild prepatellar bursitis. 6. Mild edema tracks adjacent to the MCL. This can  be incidental but in the appropriate clinical circumstance could represent grade 1 sprain. 7. Low-level subcortical marrow edema posteriorly along the tibial spine. 8. Despite efforts by the technologist and patient, motion artifact is present on today's exam and could not be eliminated. This reduces exam sensitivity and specificity.  She underwent a Marcaine injection and examination on 03/30/2020.  She was admitted on 04/03/2020 and underwent arthroscopy.  Dr. Alphonzo Severance, her orthopedic surgeon, described finding significant synovitis.   Operative specimens including fluid and tissue were sent for stain and culture.  Both were negative.  I elected to treat her for culture-negative septic arthritis.  I recommended discharged on IV vancomycin and oral ciprofloxacin.  Inadvertently the ciprofloxacin was not ordered.  She has now completed 15 days of IV vancomycin.  She has not had any problems tolerating her PICC or vancomycin.  Her knee is feeling much better and is much less swollen.  However, 6 days ago she began to develop tender nodules on her face and scalp.  The first was in the area of her right parotid gland.  Since then she has also developed tender nodes under her chin, and her left ear and right posterior neck.  She has no new upper respiratory symptoms or fever.  Review of Systems: Review of Systems  Constitutional: Negative for chills, diaphoresis and fever.  HENT: Negative for congestion, sinus pain and sore throat.        As noted in HPI.  Cardiovascular: Negative for chest pain.  Gastrointestinal: Negative for abdominal pain, diarrhea, nausea and vomiting.  Musculoskeletal: Positive for joint pain.  Neurological: Negative for headaches.    Past Medical History:  Diagnosis Date  . Anxiety   . Arthritis    "back?" (11/03/2015)  . Chest pain 11/02/2015  . Depression   . Exertional dyspnea 01/05/2016  . Heart murmur   . History of peptic ulcer "late 1970's"  . Migraine    "maybe 3-4/yr now" (11/03/2015)  . 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 Mitral valve prolapse 12/26/2015   symptomatic"MVP surgery postponed to get lesion of pancreas evaluated first".  . Pancreatic mass 01/29/2016   2.8 cm enhancing mass in the pancreatic neck and 3.1 cm low-attenuation cystic lesion in body of pancreas noted on CT angiogram  . PONV (postoperative nausea and vomiting)   . S/P minimally invasive mitral valve repair 03/06/2016   Complex valvuloplasty including triangular resection of posterior leaflet, artificial  Gore-tex neochord placement x6 and 34 mm Memo 3D ring annuloplasty via right mini thoracotomy approach with clipping of LA appendage  . Thyroid nodule 01/29/2016   2.4 cm enhancing mixed cystic and solid nodule inferior left thyroid gland noted on CT angiogram    Social History   Tobacco Use  . Smoking status: Never Smoker  . Smokeless tobacco: Never Used  Substance Use Topics  . Alcohol use: Yes    Alcohol/week: 10.0 standard drinks    Types: 6 Glasses of wine, 4 Shots of liquor per week    Comment: occasional  . Drug use: No    Family History  Problem Relation Age of Onset  . Hypertension Mother   . Cancer Mother 86       MELANOMA  . Cancer Father        PROSTATE  . Cancer Sister 15       BREAST    No Known Allergies  Objective: Vitals:   04/20/20 1342  BP: (!) 160/98  Pulse: 80  Temp: 98.2 F (36.8 C)  SpO2: 97%  Weight: 154 lb (69.9 kg)  Height: '5\' 6"'  (1.676 m)   Body mass index is 24.86 kg/m.  Physical Exam Constitutional:      Comments: She is calm and pleasant.  HENT:     Head:     Comments: As noted in the HPI she has very tender nodules in the area of her right parotid gland, submandibular space, behind her left ear and right posterior neck.  Erythema or fluctuance.  Her oromucosa is moist.  She has no obvious dental problems.  She has no pharyngeal edema or exudates. Cardiovascular:     Rate and Rhythm: Normal rate and regular rhythm.     Heart sounds: No murmur.  Pulmonary:     Effort: Pulmonary effort is normal.     Breath sounds: Normal breath sounds.  Musculoskeletal:        General: Swelling and tenderness present.     Comments: The swelling and warmth in her right knee has decreased.  Skin:    Findings: No rash.     Comments: Right arm PICC site looks good.      Problem List Items Addressed This Visit      High   Synovitis of right knee    Her right knee synovitis is improving following recent surgery and a little over 2 weeks of  IV vancomycin.  I will continue vancomycin alone and see her back in 6 days.  I do not know the cause of her tender nodule/adenopathy.  I do not think that it is related to her cellulitis or to vancomycin.  I will reevaluate her when she follows up next week.          Michel Bickers, MD [facility] for [facility] [phone] pager   [phone] cell 04/20/2020, 2:22 PM

## 2020-04-24 ENCOUNTER — Encounter: Payer: Self-pay | Admitting: Internal Medicine

## 2020-04-26 ENCOUNTER — Other Ambulatory Visit: Payer: Self-pay

## 2020-04-26 ENCOUNTER — Telehealth: Payer: Self-pay

## 2020-04-26 ENCOUNTER — Ambulatory Visit (INDEPENDENT_AMBULATORY_CARE_PROVIDER_SITE_OTHER): Payer: Medicare Other | Admitting: Internal Medicine

## 2020-04-26 ENCOUNTER — Encounter: Payer: Self-pay | Admitting: Internal Medicine

## 2020-04-26 DIAGNOSIS — M659 Synovitis and tenosynovitis, unspecified: Secondary | ICD-10-CM | POA: Diagnosis present

## 2020-04-26 MED ORDER — VANCOMYCIN IV (FOR PTA / DISCHARGE USE ONLY): 1500.0000 mg | INTRAVENOUS | Status: AC

## 2020-04-26 NOTE — Assessment & Plan Note (Addendum)
Her culture-negative cellulitis continues to improve.  I discussed options for duration of therapy and route of therapy including stopping now, converting to oral doxycycline or continuing vancomycin by IV a little longer.  They are most comfortable with continuing vancomycin.  She will receive 1 more week of therapy then have her PICC removed.  I told her that I did not feel that she was not likely to have Lyme arthritis given that she has not been in an endemic area of Lyme transmission and since she is getting better on vancomycin.  She will follow up here in 4 weeks.  I do not know the cause of her recent tender nodules/lymph nodes but she is improving spontaneously so I do not feel any further evaluation is indicated.

## 2020-04-26 NOTE — Telephone Encounter (Signed)
Per MD called Home health to extend IV vanc 7 more days and pull pic after last dose. Spoke with Corrie Dandy who was able to take verbal order.  Lorenso Courier, [place]

## 2020-04-26 NOTE — Progress Notes (Signed)
[facility] for Infectious Disease  Patient Active Problem List   Diagnosis Date Noted  . Synovitis of right knee 04/04/2020    Priority: High  . Long term (current) use of anticoagulants [Z79.01] 03/14/2016  . S/P minimally invasive mitral valve repair 03/06/2016  . Pancreatic mass 01/29/2016  . Thyroid nodule 01/29/2016  . Exertional dyspnea 01/05/2016  . 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 Mitral insufficiency   . Mitral valve prolapse 12/26/2015  . Chest pain 11/02/2015  . Anxiety 11/02/2015    Patient's Medications  New Prescriptions   No medications on file  Previous Medications   ASPIRIN 81 MG EC TABLET    Take 1 tablet (81 mg total) by mouth daily.   CHOLECALCIFEROL (VITAMIN D3) 25 MCG (1000 UNIT) TABLET    Take 1,000 Units by mouth daily.   DIGESTIVE ENZYMES (DIGESTIVE ENZYME PO)    Take 1 capsule by mouth daily.   IBUPROFEN (ADVIL) 800 MG TABLET    Take 1 tablet (800 mg total) by mouth every 8 (eight) hours as needed.   METHOCARBAMOL (ROBAXIN) 500 MG TABLET    Take 1 tablet (500 mg total) by mouth every 8 (eight) hours as needed.   OXYCODONE (OXY IR/ROXICODONE) 5 MG IMMEDIATE RELEASE TABLET    Take 1 tablet (5 mg total) by mouth every 8 (eight) hours as needed for moderate pain (pain score 4-6).  Modified Medications   Modified Medication Previous Medication   VANCOMYCIN IVPB vancomycin IVPB      Inject 1,500 mg into the vein daily for 7 days. Indication: Septic arthritis Last Day of Therapy:  04/26/20 Labs - Sunday/Monday:  CBC/D, BMP, and vancomycin trough. Labs - Thursday:  BMP and vancomycin trough Labs - Every other week:  ESR and CRP    Inject 1,500 mg into the vein daily for 21 days. Indication: Septic arthritis Last Day of Therapy:  04/26/20 Labs - Sunday/Monday:  CBC/D, BMP, and vancomycin trough. Labs - Thursday:  BMP and vancomycin trough Labs - Every other week:  ESR and CRP  Discontinued Medications   No medications on  file    Subjective: Ms. Sheriff is in for her hospital follow-up visit.  She twisted her right knee when stepping out of the bed of a truck in January.  She had progressively more severe pain and swelling over the next month.  She saw Dr. Arelia Sneddon in February.  Records indicate that he aspirated the knee and injected steroids.  Synovial fluid white blood cell count was 34,510 with 84% neutrophils.  Synovial fluid culture was negative.  Her pain and swelling actually got worse after the injection.  She was referred to Dr. Sharyne Richters and saw him on 03/23/2020.  He aspirated the knee of about 5 cc of thin serous fluid.  The lab describes the fluid is looking orange and cloudy.  There were only 62 white blood cells of which 74% were lymphocytes.  No crystals were seen.  No organisms were seen on Gram stain and culture was negative.  Her serum uric acid was normal.  Her sed rate and C-reactive protein were elevated.  She underwent an MRI on 03/29/2020 which showed:  IMPRESSION: 1. Diminutive medial meniscus with grade 3 oblique signal in the posterior horn approaching the inferior meniscal surface, compatible with a grade 3 oblique tear. 2. The lateral meniscus is also diminutive but not appreciably torn. 3. Large complex and heterogeneous knee effusion probably from  hemarthrosis, less likely from extensive synovitis, with thickened medial plica. 4. Varying degrees of articular cartilage thinning, generally moderate, although with some full-thickness loss of articular cartilage focally in the lateral compartment posteriorly. 5. Prepatellar subcutaneous edema and suspected mild prepatellar bursitis. 6. Mild edema tracks adjacent to the MCL. This can be incidental but in the appropriate clinical circumstance could represent grade 1 sprain. 7. Low-level subcortical marrow edema posteriorly along the tibial spine. 8. Despite efforts by the technologist and patient, motion artifact is present on today's  exam and could not be eliminated. This reduces exam sensitivity and specificity.  She underwent a Marcaine injection and examination on 03/30/2020.  She was admitted on 04/03/2020 and underwent arthroscopy.  Dr. Scott Dean, her orthopedic surgeon, described finding significant synovitis.  Operative specimens including fluid and tissue were sent for stain and culture.  Both were negative.  I elected to treat her for culture-negative septic arthritis.  I recommended discharged on IV vancomycin and oral ciprofloxacin.  Inadvertently the ciprofloxacin was not ordered.  She has now completed 21 days of IV vancomycin.  She has not had any problems tolerating her PICC or vancomycin.  Her knee is feeling much better and is less swollen.  Two weeks ago she began to develop tender nodules on her face and scalp.  The first was in the area of her right parotid gland.  She also developed tender nodes under her chin, and her left ear and right posterior neck.  No new nodules have developed since she saw me 6 days ago and the existing nodules are all less tender and getting smaller.  Review of Systems: Review of Systems  Constitutional: Negative for chills, diaphoresis and fever.  HENT: Negative for congestion, sinus pain and sore throat.        As noted in HPI.  Cardiovascular: Negative for chest pain.  Gastrointestinal: Negative for abdominal pain, diarrhea, nausea and vomiting.  Musculoskeletal: Positive for joint pain.  Neurological: Negative for headaches.    Past Medical History:  Diagnosis Date  . Anxiety   . Arthritis    "back?" (11/03/2015)  . Chest pain 11/02/2015  . Depression   . Exertional dyspnea 01/05/2016  . Heart murmur   . History of peptic ulcer "late 1970's"  . Migraine    "maybe 3-4/yr now" (11/03/2015)  . 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 Mitral valve prolapse 12/26/2015   symptomatic"MVP surgery postponed to get lesion of pancreas evaluated first".  . Pancreatic  mass 01/29/2016   2.8 cm enhancing mass in the pancreatic neck and 3.1 cm low-attenuation cystic lesion in body of pancreas noted on CT angiogram  . PONV (postoperative nausea and vomiting)   . S/P minimally invasive mitral valve repair 03/06/2016   Complex valvuloplasty including triangular resection of posterior leaflet, artificial Gore-tex neochord placement x6 and 34 mm Memo 3D ring annuloplasty via right mini thoracotomy approach with clipping of LA appendage  . Thyroid nodule 01/29/2016   2.4 cm enhancing mixed cystic and solid nodule inferior left thyroid gland noted on CT angiogram    Social History   Tobacco Use  . Smoking status: Never Smoker  . Smokeless tobacco: Never Used  Substance Use Topics  . Alcohol use: Yes    Alcohol/week: 10.0 standard drinks    Types: 6 Glasses of wine, 4 Shots of liquor per week    Comment: occasional  . Drug use: No    Family History  Problem Relation Age of Onset  .   Hypertension Mother   . Cancer Mother 44       MELANOMA  . Cancer Father        PROSTATE  . Cancer Sister 61       BREAST    No Known Allergies  Objective: Vitals:   04/26/20 1345  BP: (!) 160/109  Pulse: 79  Temp: 98.3 F (36.8 C)  TempSrc: Oral  Weight: 149 lb 9.6 oz (67.9 kg)   Body mass index is 24.15 kg/m.  Physical Exam Constitutional:      Comments: She is pleasant and in better spirits today.  HENT:     Head:     Comments: All of the nodules on her face and neck are regressing.  They are much less tender today. Cardiovascular:     Rate and Rhythm: Normal rate and regular rhythm.     Heart sounds: No murmur.  Pulmonary:     Effort: Pulmonary effort is normal.     Breath sounds: Normal breath sounds.  Musculoskeletal:        General: Swelling and tenderness present.     Comments: The swelling and warmth in her right knee has decreased.  Skin:    Findings: No rash.     Comments: Right arm PICC site looks good.      Problem List Items Addressed  This Visit      High   Synovitis of right knee    Her culture-negative cellulitis continues to improve.  I discussed options for duration of therapy and route of therapy including stopping now, converting to oral doxycycline or continuing vancomycin by IV a little longer.  They are most comfortable with continuing vancomycin.  She will receive 1 more week of therapy then have her PICC removed.  I told her that I did not feel that she was not likely to have Lyme arthritis given that she has not been in an endemic area of Lyme transmission and since she is getting better on vancomycin.  She will follow up here in 4 weeks.  I do not know the cause of her recent tender nodules/lymph nodes but she is improving spontaneously so I do not feel any further evaluation is indicated.          Michel Bickers, MD [facility] for [facility] [phone] pager   [phone] cell 04/26/2020, 2:14 PM

## 2020-05-01 ENCOUNTER — Encounter: Payer: Self-pay | Admitting: Internal Medicine

## 2020-05-03 ENCOUNTER — Ambulatory Visit (INDEPENDENT_AMBULATORY_CARE_PROVIDER_SITE_OTHER): Payer: Medicare Other | Admitting: Orthopedic Surgery

## 2020-05-03 ENCOUNTER — Other Ambulatory Visit: Payer: Self-pay

## 2020-05-03 ENCOUNTER — Encounter: Payer: Self-pay | Admitting: Orthopedic Surgery

## 2020-05-03 DIAGNOSIS — M24661 Ankylosis, right knee: Secondary | ICD-10-CM

## 2020-05-03 NOTE — Progress Notes (Signed)
Post-Op Visit Note   Patient: Becky Murphy           Date of Birth: [birth year]           MRN: [id number] Visit Date: 05/03/2020 PCP: Kaleen Mask, MD   Assessment & Plan:  Chief Complaint:  Chief Complaint  Patient presents with  . Right Knee - Routine Post Op   Visit Diagnoses:  1. Arthrofibrosis of knee joint, right     Plan: Becky Murphy is a 69 year old patient who is now about a month out right knee arthroscopy and debridement.  Had presumably infection in the right knee which was low-grade.  Antibiotic beads placed and she has been on IV antibiotics since that time.  Overall she is 90% better.  Using a cane.  Seeing infectious disease.  On exam no real effusion.  Still slight warmth to the knee.  Range of motion is actually pretty good lacking only 5 degrees of extension and bending to about 100 degrees which is a major improvement.  Plan at this time is to continue with IV antibiotics changing over to oral antibiotics per infectious disease.  Overall I think she looks very good.  Follow-up with me as needed.  I do want her to come back for further evaluation if she has any recurrent large effusion or fevers or severe pain.  Follow-Up Instructions: No follow-ups on file.   Orders:  No orders of the defined types were placed in this encounter.  No orders of the defined types were placed in this encounter.   Imaging: No results found.  PMFS History: Patient Active Problem List   Diagnosis Date Noted  . Synovitis of right knee 04/04/2020  . Long term (current) use of anticoagulants [Z79.01] 03/14/2016  . S/P minimally invasive mitral valve repair 03/06/2016  . Pancreatic mass 01/29/2016  . Thyroid nodule 01/29/2016  . Exertional dyspnea 01/05/2016  . 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 Mitral insufficiency   . Mitral valve prolapse 12/26/2015  . Chest pain 11/02/2015  . Anxiety 11/02/2015   Past Medical History:  Diagnosis Date  .  Anxiety   . Arthritis    "back?" (11/03/2015)  . Chest pain 11/02/2015  . Depression   . Exertional dyspnea 01/05/2016  . Heart murmur   . History of peptic ulcer "late 1970's"  . Migraine    "maybe 3-4/yr now" (11/03/2015)  . 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 Mitral valve prolapse 12/26/2015   symptomatic"MVP surgery postponed to get lesion of pancreas evaluated first".  . Pancreatic mass 01/29/2016   2.8 cm enhancing mass in the pancreatic neck and 3.1 cm low-attenuation cystic lesion in body of pancreas noted on CT angiogram  . PONV (postoperative nausea and vomiting)   . S/P minimally invasive mitral valve repair 03/06/2016   Complex valvuloplasty including triangular resection of posterior leaflet, artificial Gore-tex neochord placement x6 and 34 mm Memo 3D ring annuloplasty via right mini thoracotomy approach with clipping of LA appendage  . Thyroid nodule 01/29/2016   2.4 cm enhancing mixed cystic and solid nodule inferior left thyroid gland noted on CT angiogram    Family History  Problem Relation Age of Onset  . Hypertension Mother   . Cancer Mother 12       MELANOMA  . Cancer Father        PROSTATE  . Cancer Sister 62       BREAST    Past Surgical History:  Procedure Laterality Date  . BACK SURGERY    . BREAST BIOPSY Left ~ 2005  . BREAST LUMPECTOMY Left ~ 2005  . CARDIAC CATHETERIZATION N/A 01/16/2016   Procedure: Right/Left Heart Cath and Coronary Angiography;  Surgeon: Sanda Klein, MD;  Location: [place] [facility];  Service: Cardiovascular;  Laterality: N/A;  . CLIPPING OF ATRIAL APPENDAGE Left 03/06/2016   Procedure: CLIPPING OF LEFT ATRIAL APPENDAGE using a 31 PRO2 AtriClip;  Surgeon: Rexene Alberts, MD;  Location: [place];  Service: Open Heart Surgery;  Laterality: Left;  . COLONOSCOPY    . EUS N/A 02/20/2016   Procedure: ESOPHAGEAL ENDOSCOPIC ULTRASOUND (EUS) RADIAL;  Surgeon: Arta Silence, MD;  Location: [facility];  Service: Endoscopy;   Laterality: N/A;  . EYE MUSCLE SURGERY Bilateral 1970's?  Marland Kitchen KNEE ARTHROSCOPY Right 04/04/2020   Procedure: RIGHT KNEE ARTHROSCOPY WITH DEBRIDEMENT;  Surgeon: Meredith Pel, MD;  Location: [place];  Service: Orthopedics;  Laterality: Right;  . [place]   "trimmed bulges off both sides"  . MITRAL VALVE REPAIR Right 03/06/2016   Procedure: MINIMALLY INVASIVE MITRAL VALVE REPAIR (MVR) using a 34 Sorin Memo 3D Ring;  Surgeon: Rexene Alberts, MD;  Location: [place];  Service: Open Heart Surgery;  Laterality: Right;  . TEE WITHOUT CARDIOVERSION N/A 01/05/2016   Procedure: TRANSESOPHAGEAL ECHOCARDIOGRAM (TEE);  Surgeon: Sanda Klein, MD;  Location: [facility];  Service: Cardiovascular;  Laterality: N/A;  . TEE WITHOUT CARDIOVERSION N/A 03/06/2016   Procedure: TRANSESOPHAGEAL ECHOCARDIOGRAM (TEE);  Surgeon: Rexene Alberts, MD;  Location: [place];  Service: Open Heart Surgery;  Laterality: N/A;  . TUBAL LIGATION     Social History   Occupational History  . Not on file  Tobacco Use  . Smoking status: Never Smoker  . Smokeless tobacco: Never Used  Substance and Sexual Activity  . Alcohol use: Yes    Alcohol/week: 10.0 standard drinks    Types: 6 Glasses of wine, 4 Shots of liquor per week    Comment: occasional  . Drug use: No  . Sexual activity: Not Currently

## 2020-05-08 ENCOUNTER — Telehealth: Payer: Self-pay

## 2020-05-08 NOTE — Telephone Encounter (Signed)
Patient is scheduled to be evaluated by Dr. Orvan Falconer on Wednesday 5/12. Patient reports that RN had a difficult time removing PICC line at time of removal. Patient inquired about what to do about rash; recommended continued benadryl and to try hydrocortisone cream until appointment.   Aviendha Azbell Loyola Mast, RN

## 2020-05-08 NOTE — Telephone Encounter (Signed)
Please ask her if she is having any fever and all for a visit with me on Wednesday, 05/10/2020.  I definitely want to look at the former PICC site.  I cannot tell what is causing her rash.  It is unlikely to be due to her recent antibiotics.

## 2020-05-08 NOTE — Telephone Encounter (Signed)
[facility] RN Diane is calling with concern related to  PICC removal , she states there was difficulty with removing PICC last week and the patient's husband called to say there was swelling present.   I spoke with patient's husband who states there was swelling present between the elbow and shoulder the day of PICC removal , today the swelling is present at the elbow area and is warm to touch with swelling present in hand. I advised patient I would speak with Dr Orvan Falconer for advise and we would possible need them to seek attention today.  The husband stated they are at the primary care office now for a rash issue.   I asked for the primary care provider to look at the arm in which the PICC was removed and call our office directly if there are areas of concern.    It seems the husband called our office earlier and never mentioned the swelling , his only concern was the rash. Based on our phone conversation I have concerns related to his descriptions and struggled with obtaining specific details, along with initial call with concern of only the rash which I think covers her whole body.    Laurell Josephs, RN

## 2020-05-08 NOTE — Telephone Encounter (Signed)
Patient called triage and left VM reporting a rash that began 5/9 that has gotten worse. Reports that its from her "head to toes", no reported shortness of breath, states that the rash resembles welts and some swelling. PICC site (removed this past week) is painful, raised and states that it feels tight. Patient did take benadryl last night with no reported relief. Patient is not taking any antibiotics at this time; completed 5/5.  Told patient that Dr. Orvan Falconer will be notified but it may be prudent to reach out to her primary care doctor or visit an urgent care for evaluation if symptoms are worsening.   Becky Gaubert Loyola Mast, RN

## 2020-05-08 NOTE — Telephone Encounter (Signed)
I do spoke with one of the providers at [facility] family practice.  She is seeing Becky Murphy right now.  The area where her PICC was is swollen but without any signs of infection.  I suggested getting a venous Doppler to for evidence of DVT.  She is going to treat her with a brief steroid taper and histamine blocker for her rash.  She will follow-up with me in 2 days.

## 2020-05-10 ENCOUNTER — Ambulatory Visit: Payer: Medicare Other | Admitting: Internal Medicine

## 2020-05-24 ENCOUNTER — Encounter: Payer: Self-pay | Admitting: Internal Medicine

## 2020-05-24 ENCOUNTER — Ambulatory Visit (INDEPENDENT_AMBULATORY_CARE_PROVIDER_SITE_OTHER): Payer: Medicare Other | Admitting: Internal Medicine

## 2020-05-24 ENCOUNTER — Other Ambulatory Visit: Payer: Self-pay

## 2020-05-24 DIAGNOSIS — M659 Synovitis and tenosynovitis, unspecified: Secondary | ICD-10-CM | POA: Diagnosis present

## 2020-05-24 NOTE — Assessment & Plan Note (Signed)
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  I am not sure what caused her pruritic rash since it was several days after completing antibiotic therapy.  She can follow-up here as needed.

## 2020-05-24 NOTE — Progress Notes (Signed)
[facility]  Patient Active Problem List   Diagnosis Date Noted  . Synovitis of right knee 04/04/2020    Priority: High  . Long term (current) use of anticoagulants [Z79.01] 03/14/2016  . S/P minimally invasive mitral valve repair 03/06/2016  . Pancreatic mass 01/29/2016  . Thyroid nodule 01/29/2016  . Exertional dyspnea 01/05/2016  . 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 Mitral insufficiency   . Mitral valve prolapse 12/26/2015  . Chest pain 11/02/2015  . Anxiety 11/02/2015    Patient's Medications  New Prescriptions   No medications on file  Previous Medications   ASPIRIN 81 MG EC TABLET    Take 1 tablet (81 mg total) by mouth daily.   CHOLECALCIFEROL (VITAMIN D3) 25 MCG (1000 UNIT) TABLET    Take 1,000 Units by mouth daily.   DIGESTIVE ENZYMES (DIGESTIVE ENZYME PO)    Take 1 capsule by mouth daily.   IBUPROFEN (ADVIL) 800 MG TABLET    Take 1 tablet (800 mg total) by mouth every 8 (eight) hours as needed.   METHOCARBAMOL (ROBAXIN) 500 MG TABLET    Take 1 tablet (500 mg total) by mouth every 8 (eight) hours as needed.   OXYCODONE (OXY IR/ROXICODONE) 5 MG IMMEDIATE RELEASE TABLET    Take 1 tablet (5 mg total) by mouth every 8 (eight) hours as needed for moderate pain (pain score 4-6).  Modified Medications   No medications on file  Discontinued Medications   No medications on file    Subjective: Becky Murphy is in for her routine follow-up visit.  She completed 4 weeks of antibiotic therapy on 05/03/2020 for her presumed septic right knee.  Cultures from the knee were negative on 2 occasions.  3 to 4 days after completing antibiotics she developed diffuse, pruritic red rash.  She also had some swelling in her right antecubital fossa.  Doppler ultrasound showed superficial venous clot but no DVT.  Swelling has resolved.  She received a brief course of prednisone and her rash went away promptly and has not recurred.  She is feeling  better and has been able to be much more active.  She still has some mild warmth and swelling of her right knee but no pain.  She has been released by her orthopedic surgeon  Review of Systems: Review of Systems  Constitutional: Negative for fever.  Gastrointestinal: Negative for abdominal pain, diarrhea, nausea and vomiting.  Musculoskeletal: Negative for joint pain.    Past Medical History:  Diagnosis Date  . Anxiety   . Arthritis    "back?" (11/03/2015)  . Chest pain 11/02/2015  . Depression   . Exertional dyspnea 01/05/2016  . Heart murmur   . History of peptic ulcer "late 1970's"  . Migraine    "maybe 3-4/yr now" (11/03/2015)  . 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 Mitral valve prolapse 12/26/2015   symptomatic"MVP surgery postponed to get lesion of pancreas evaluated first".  . Pancreatic mass 01/29/2016   2.8 cm enhancing mass in the pancreatic neck and 3.1 cm low-attenuation cystic lesion in body of pancreas noted on CT angiogram  . PONV (postoperative nausea and vomiting)   . S/P minimally invasive mitral valve repair 03/06/2016   Complex valvuloplasty including triangular resection of posterior leaflet, artificial Gore-tex neochord placement x6 and 34 mm Memo 3D ring annuloplasty via right mini thoracotomy approach with clipping of LA appendage  . Thyroid nodule 01/29/2016  2.4 cm enhancing mixed cystic and solid nodule inferior left thyroid gland noted on CT angiogram    Social History   Tobacco Use  . Smoking status: Never Smoker  . Smokeless tobacco: Never Used  Substance Use Topics  . Alcohol use: Yes    Alcohol/week: 10.0 standard drinks    Types: 6 Glasses of wine, 4 Shots of liquor per week    Comment: occasional  . Drug use: No    Family History  Problem Relation Age of Onset  . Hypertension Mother   . Cancer Mother 41       MELANOMA  . Cancer Father        PROSTATE  . Cancer Sister 60       BREAST    No Known  Allergies  Objective: Vitals:   05/24/20 1340  BP: 113/78  Pulse: 89  Temp: 97.7 F (36.5 C)  SpO2: 96%  Weight: 148 lb (67.1 kg)  Height: 5\' 6"  (1.676 m)   Body mass index is 23.89 kg/m.  Physical Exam Constitutional:      Comments: She is in good spirits.  She is accompanied by her husband.  Musculoskeletal:        General: Swelling present. No tenderness.     Comments: She has some mild residual swelling and warmth of the right knee.  She has excellent range of motion.  Skin:    Findings: No rash.     Comments: She has no swelling of her right arm where her PICC line had been.     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  I am not sure what caused her pruritic rash since it was several days after completing antibiotic therapy.  She can follow-up here as needed.          Michel Bickers, MD [facility] [place] [facility] [phone] pager   [phone] cell 05/24/2020, 1:56 PM

## 2021-03-12 ENCOUNTER — Telehealth: Payer: Self-pay

## 2021-03-22 ENCOUNTER — Encounter: Payer: Self-pay | Admitting: Orthopedic Surgery

## 2021-03-22 ENCOUNTER — Ambulatory Visit (INDEPENDENT_AMBULATORY_CARE_PROVIDER_SITE_OTHER): Payer: Medicare Other

## 2021-03-22 ENCOUNTER — Ambulatory Visit: Payer: Self-pay

## 2021-03-22 ENCOUNTER — Ambulatory Visit (INDEPENDENT_AMBULATORY_CARE_PROVIDER_SITE_OTHER): Payer: Medicare Other | Admitting: Orthopedic Surgery

## 2021-03-22 DIAGNOSIS — M1711 Unilateral primary osteoarthritis, right knee: Secondary | ICD-10-CM

## 2021-03-22 DIAGNOSIS — G8929 Other chronic pain: Secondary | ICD-10-CM | POA: Diagnosis not present

## 2021-03-22 DIAGNOSIS — M25562 Pain in left knee: Secondary | ICD-10-CM | POA: Diagnosis not present

## 2021-03-22 DIAGNOSIS — M25461 Effusion, right knee: Secondary | ICD-10-CM

## 2021-03-22 DIAGNOSIS — M25561 Pain in right knee: Secondary | ICD-10-CM

## 2021-03-22 NOTE — Progress Notes (Signed)
Office Visit Note   Patient: Becky Murphy           Date of Birth: 1951/01/17           MRN: [id number] Visit Date: 03/22/2021 Requested by: Leonard Downing, MD [id number] [place],  [place] [address] PCP: Leonard Downing, MD  Subjective: Chief Complaint  Patient presents with  . Left Knee - Pain  . Right Knee - Pain    HPI: Becky Murphy is a 70 y.o. female who presents to the office complaining of bilateral knee pain.  She complains of right knee greater than left knee pain.  She has had severe pain since November particularly in the right knee which makes of 80% of her pain.  She notes difficulty with walking and she feels that she limps most of the time.  She denies any fevers, chills, night sweats, malaise.  She does have history of right knee infection in 2021 that was treated with arthroscopic irrigation and debridement and resolved with 4 weeks of antibiotics through IV.  She never had any positive cultures throughout that work-up.  Today she complains of diffuse right knee pain with radiation into the shin.  She notes swelling on and off but particularly at the end of the day.  The right knee gives out on her due to pain.  Denies any groin pain but does report some occasional radicular pain when she goes from a standing to a sitting position or sitting to standing.  She has occasional low back pain.  She does report that the symptoms feel similar to how she felt at the time of her previous right knee infection.  Her husband reports that the knee feels hot to the touch compared with the contralateral knee.  She has had to significantly decrease her activity due to the knee pain over the last year..                ROS: All systems reviewed are negative as they relate to the chief complaint within the history of present illness.  Patient denies fevers or chills.  Assessment & Plan: Visit Diagnoses:  1. Effusion of right knee   2. Chronic pain of both knees   3.  Unilateral primary osteoarthritis, right knee     Plan: Patient is a 70 year old female who presents complaint of bilateral knee pain.  Her primary complaint is her right knee which has a history of right knee infection that had to be treated with arthroscopic irrigation and debridement as well as IV antibiotics.  Cultures were negative at the past.  She feels her knee symptoms currently are similar to how she felt last year with the infection.  She does have a large effusion today and increased warmth of the knee with diffuse tenderness.  This effusion was aspirated and this fluid will be sent for Gram stain, aerobic/anaerobic culture, cell count, crystal analysis.  Blood work was obtained and will evaluate ESR, CRP, CBC with differential.  Radiographs were taken today as well which showed that patient has moderate to severe arthritis of the right knee with mild progression since last set of radiographs.  Discussed options available to patient regarding the long-term outcome of this knee.  Lab tests will help determine if her swelling is from the arthritis or from recurrent infection.  With severe arthritis, discussed the potential option for total knee arthroplasty in the future.  However, patient was made aware that with her history of  right knee infection, and any surgery on her right knee will be at an elevated risk of infection.  Before considering knee replacement, require multiple aspirations that showed negative cultures.  Discussed the severity of prosthetic joint infection in general and patient and husband understand.  They have had a friend who has passed away from complications of prosthetic joint infection.  For now, plan to evaluate this right knee for infection and will proceed from there.  Plan to call patient with lab results and then discuss further plan.  Follow-up in 4 weeks.  Follow-Up Instructions: No follow-ups on file.   Orders:  Orders Placed This Encounter  Procedures  . XR KNEE  3 VIEW LEFT  . XR KNEE 3 VIEW RIGHT   No orders of the defined types were placed in this encounter.     Procedures: Large Joint Inj: R knee on 03/22/2021 1:15 PM Indications: diagnostic evaluation, joint swelling and pain Details: 18 G 1.5 in needle, superolateral approach  Arthrogram: No  Medications: 5 mL lidocaine 1 % Aspirate: 40 mL cloudy and bloody Outcome: tolerated well, no immediate complications Procedure, treatment alternatives, risks and benefits explained, specific risks discussed. Consent was given by the patient. Immediately prior to procedure a time out was called to verify the correct patient, procedure, equipment, support staff and site/side marked as required. Patient was prepped and draped in the usual sterile fashion.       Clinical Data: No additional findings.  Objective: Vital Signs: There were no vitals taken for this visit.  Physical Exam:  Constitutional: Patient appears well-developed HEENT:  Head: Normocephalic Eyes:EOM are normal Neck: Normal range of motion Cardiovascular: Normal rate Pulmonary/chest: Effort normal Neurologic: Patient is alert Skin: Skin is warm Psychiatric: Patient has normal mood and affect  Ortho Exam: Ortho exam demonstrates right knee with large effusion and significant warmth diffusely throughout the knee compared with the contralateral knee.  She is tender diffusely.  Well-healed incisions from prior arthroscopy.  No sinus tract noted.  She extends to 3 to 5 degrees with the right knee and flexes greater than 90 degrees.  No calf tenderness bilaterally.  Small effusion of the left knee but no significant warmth.  She is tender over both joint lines bilaterally but tenderness is severe on the right and mild to moderate on the left.  No pain with hip range of motion bilaterally.  Extensor mechanism intact bilaterally.  Specialty Comments:  No specialty comments available.  Imaging: No results found.   PMFS  History: Patient Active Problem List   Diagnosis Date Noted  . Synovitis of right knee 04/04/2020  . Long term (current) use of anticoagulants [Z79.01] 03/14/2016  . S/P minimally invasive mitral valve repair 03/06/2016  . Pancreatic mass 01/29/2016  . Thyroid nodule 01/29/2016  . Exertional dyspnea 01/05/2016  . 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 Mitral insufficiency   . Mitral valve prolapse 12/26/2015  . Chest pain 11/02/2015  . Anxiety 11/02/2015   Past Medical History:  Diagnosis Date  . Anxiety   . Arthritis    "back?" (11/03/2015)  . Chest pain 11/02/2015  . Depression   . Exertional dyspnea 01/05/2016  . Heart murmur   . History of peptic ulcer "late 1970's"  . Migraine    "maybe 3-4/yr now" (11/03/2015)  . 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 Mitral valve prolapse 12/26/2015   symptomatic"MVP surgery postponed to get lesion of pancreas evaluated first".  . Pancreatic mass 01/29/2016  2.8 cm enhancing mass in the pancreatic neck and 3.1 cm low-attenuation cystic lesion in body of pancreas noted on CT angiogram  . PONV (postoperative nausea and vomiting)   . S/P minimally invasive mitral valve repair 03/06/2016   Complex valvuloplasty including triangular resection of posterior leaflet, artificial Gore-tex neochord placement x6 and 34 mm Memo 3D ring annuloplasty via right mini thoracotomy approach with clipping of LA appendage  . Thyroid nodule 01/29/2016   2.4 cm enhancing mixed cystic and solid nodule inferior left thyroid gland noted on CT angiogram    Family History  Problem Relation Age of Onset  . Hypertension Mother   . Cancer Mother 58       MELANOMA  . Cancer Father        PROSTATE  . Cancer Sister 77       BREAST    Past Surgical History:  Procedure Laterality Date  . BACK SURGERY    . BREAST BIOPSY Left ~ 2005  . BREAST LUMPECTOMY Left ~ 2005  . CARDIAC CATHETERIZATION N/A 01/16/2016   Procedure: Right/Left Heart  Cath and Coronary Angiography;  Surgeon: Sanda Klein, MD;  Location: [place] [facility];  Service: Cardiovascular;  Laterality: N/A;  . CLIPPING OF ATRIAL APPENDAGE Left 03/06/2016   Procedure: CLIPPING OF LEFT ATRIAL APPENDAGE using a 68 PRO2 AtriClip;  Surgeon: Rexene Alberts, MD;  Location: [place];  Service: Open Heart Surgery;  Laterality: Left;  . COLONOSCOPY    . EUS N/A 02/20/2016   Procedure: ESOPHAGEAL ENDOSCOPIC ULTRASOUND (EUS) RADIAL;  Surgeon: Arta Silence, MD;  Location: [facility];  Service: Endoscopy;  Laterality: N/A;  . EYE MUSCLE SURGERY Bilateral 1970's?  Marland Kitchen KNEE ARTHROSCOPY Right 04/04/2020   Procedure: RIGHT KNEE ARTHROSCOPY WITH DEBRIDEMENT;  Surgeon: Meredith Pel, MD;  Location: [place];  Service: Orthopedics;  Laterality: Right;  . [place]   "trimmed bulges off both sides"  . MITRAL VALVE REPAIR Right 03/06/2016   Procedure: MINIMALLY INVASIVE MITRAL VALVE REPAIR (MVR) using a 34 Sorin Memo 3D Ring;  Surgeon: Rexene Alberts, MD;  Location: [place];  Service: Open Heart Surgery;  Laterality: Right;  . TEE WITHOUT CARDIOVERSION N/A 01/05/2016   Procedure: TRANSESOPHAGEAL ECHOCARDIOGRAM (TEE);  Surgeon: Sanda Klein, MD;  Location: [facility];  Service: Cardiovascular;  Laterality: N/A;  . TEE WITHOUT CARDIOVERSION N/A 03/06/2016   Procedure: TRANSESOPHAGEAL ECHOCARDIOGRAM (TEE);  Surgeon: Rexene Alberts, MD;  Location: [place];  Service: Open Heart Surgery;  Laterality: N/A;  . TUBAL LIGATION     Social History   Occupational History  . Not on file  Tobacco Use  . Smoking status: Never Smoker  . Smokeless tobacco: Never Used  Vaping Use  . Vaping Use: Never used  Substance and Sexual Activity  . Alcohol use: Yes    Alcohol/week: 10.0 standard drinks    Types: 6 Glasses of wine, 4 Shots of liquor per week    Comment: occasional  . Drug use: No  . Sexual activity: Not Currently

## 2021-03-23 LAB — C-REACTIVE PROTEIN: CRP: 29.1 mg/L — ABNORMAL HIGH (ref <8.0)

## 2021-03-23 LAB — CBC WITH DIFFERENTIAL/PLATELET
Absolute Monocytes: 704 cells/uL (ref 200–950)
Basophils Absolute: 97 cells/uL (ref 0–200)
Basophils Relative: 1.1 %
Eosinophils Absolute: 185 cells/uL (ref 15–500)
Eosinophils Relative: 2.1 %
HCT: 46.5 % — ABNORMAL HIGH (ref 35.0–45.0)
Hemoglobin: 15.7 g/dL — ABNORMAL HIGH (ref 11.7–15.5)
Lymphs Abs: 1311 cells/uL (ref 850–3900)
MCH: 30.5 pg (ref 27.0–33.0)
MCHC: 33.8 g/dL (ref 32.0–36.0)
MCV: 90.3 fL (ref 80.0–100.0)
MPV: 10.3 fL (ref 7.5–12.5)
Monocytes Relative: 8 %
Neutro Abs: 6503 cells/uL (ref 1500–7800)
Neutrophils Relative %: 73.9 %
Platelets: 355 10*3/uL (ref 140–400)
RBC: 5.15 10*6/uL — ABNORMAL HIGH (ref 3.80–5.10)
RDW: 14.4 % (ref 11.0–15.0)
Total Lymphocyte: 14.9 %
WBC: 8.8 10*3/uL (ref 3.8–10.8)

## 2021-03-23 LAB — SYNOVIAL FLUID ANALYSIS, COMPLETE
Basophils, %: 0 %
Eosinophils-Synovial: 0 % (ref 0–2)
Lymphocytes-Synovial Fld: 2 % (ref 0–74)
Monocyte/Macrophage: 4 % (ref 0–69)
Neutrophil, Synovial: 94 % — ABNORMAL HIGH (ref 0–24)
Synoviocytes, %: 0 % (ref 0–15)
WBC, Synovial: 36440 cells/uL — ABNORMAL HIGH (ref <150)

## 2021-03-23 LAB — GRAM STAIN
MICRO NUMBER:: 11687976
SPECIMEN QUALITY:: ADEQUATE

## 2021-03-23 LAB — SEDIMENTATION RATE: Sed Rate: 17 mm/h (ref 0–30)

## 2021-03-26 ENCOUNTER — Encounter: Payer: Self-pay | Admitting: Orthopedic Surgery

## 2021-03-26 MED ORDER — LIDOCAINE HCL 1 % IJ SOLN
5.0000 mL | INTRAMUSCULAR | Status: AC | PRN
  Administered 2021-03-22: 5 mL

## 2021-03-27 LAB — BODY FLUID CULTURE

## 2021-03-28 NOTE — Telephone Encounter (Signed)
Error

## 2021-04-04 ENCOUNTER — Telehealth: Payer: Self-pay | Admitting: Orthopedic Surgery

## 2021-04-04 NOTE — Telephone Encounter (Signed)
Patient called and stated Dr. August Saucer draw fluid off her knee and did cultures and never received a call back with test results. Please call patient at [phone].

## 2021-04-04 NOTE — Telephone Encounter (Signed)
See below. Please advise.  

## 2021-04-04 NOTE — Progress Notes (Signed)
I called her.  Basically she is having a lot of pain.  Cell count 35,000 in the knee aspirate with 94% neutrophils.  No growth in culture.  More arthritis now than a year ago.  States that her options are repeat washout versus knee replacement with some risk.  I think her best bet would be to come in 815 Friday morning for repeat aspiration repeat blood work so we can see if we can identify a trend in her cell count in the knee as well as her blood work.  If it is trending up I would favor arthroscopic washout and bead placement if is trending downward we could wait it out and then do knee replacement once it normalizes.  Please make her appointment for a 15 Friday morning thanks

## 2021-04-05 NOTE — Telephone Encounter (Signed)
I called and talked with her at length this morning.  Plan is to come in tomorrow at 815 for repeat knee aspiration and repeat blood draws.  Knee aspirate needs to go for Gram stain cell count aerobic and aerobic culture and crystals.  Needs to have blood work done for sed rate C-reactive protein CBC differential.Luke aware

## 2021-04-05 NOTE — Telephone Encounter (Signed)
noted 

## 2021-04-05 NOTE — Telephone Encounter (Signed)
FYI

## 2021-04-06 ENCOUNTER — Other Ambulatory Visit: Payer: Self-pay

## 2021-04-06 ENCOUNTER — Ambulatory Visit (INDEPENDENT_AMBULATORY_CARE_PROVIDER_SITE_OTHER): Payer: Medicare Other | Admitting: Orthopedic Surgery

## 2021-04-06 DIAGNOSIS — M25461 Effusion, right knee: Secondary | ICD-10-CM | POA: Diagnosis not present

## 2021-04-08 ENCOUNTER — Encounter: Payer: Self-pay | Admitting: Orthopedic Surgery

## 2021-04-08 MED ORDER — LIDOCAINE HCL 1 % IJ SOLN
5.0000 mL | INTRAMUSCULAR | Status: AC | PRN
  Administered 2021-04-06: 5 mL

## 2021-04-08 NOTE — Progress Notes (Signed)
Office Visit Note   Patient: Becky Murphy           Date of Birth: 03-Apr-1951           MRN: [id number] Visit Date: 04/06/2021 Requested by: Leonard Downing, MD [address] [place],  [place] [address] PCP: Leonard Downing, MD  Subjective: Chief Complaint  Patient presents with  . Right Knee - Pain, Edema    HPI: Becky Murphy is a 70 year old patient with right knee pain.  Last year she underwent arthroscopy with debridement for culture-negative infection but high white count in the fluid.  She was on IV antibiotics for 6 weeks followed by oral antibiotics.  Had a very bad PICC line experience and does not want any more PICC line.  Had recurrent pain and swelling in the right knee starting several weeks ago.  Aspiration at that time demonstrated white count of 25,000.  No growth.  Sed rate C-reactive protein also slightly elevated.  Her pain has worsened.  Presents now for further evaluation and management as well as to obtain 2 data points to try to establish a trend.              ROS: All systems reviewed are negative as they relate to the chief complaint within the history of present illness.  Patient denies  fevers or chills.   Assessment & Plan: Visit Diagnoses:  1. Effusion of right knee     Plan: Impression is right knee pain with recurrent effusion.  She does have progressive arthritis in the knee as well.  Difficult assessment as far as the most appropriate intervention to take.  No other joint complaints.  I think it is possible and likely she has some type of infection in the knee.  Plan today is to aspirate the knee and send that fluid for Gram stain cell count aerobic and anaerobic culture as well as crystal analysis.  Also plan to draw CBC differential sed rate C-reactive protein..  At the time of this dictation those laboratory values are partially back.  White count in the fluid has increased from 25,000-46,000.  Her sed rate is also elevated and C-reactive  protein is approximately 120.  Plan at this time is discussion with the patient with likely arthroscopic washout and antibiotic bead placement.  Doubt that she will want to do a PICC line again.  Will likely have to have infectious disease consult again.  There is no real blood component in the knee aspirate.  Follow-Up Instructions: No follow-ups on file.   Orders:  Orders Placed This Encounter  Procedures  . Gram stain  . Anaerobic and Aerobic Culture  . Cell count + diff,  w/ cryst-synvl fld  . Sed Rate (ESR)  . C-reactive protein  . CBC with Differential   No orders of the defined types were placed in this encounter.     Procedures: Large Joint Inj: R knee on 04/06/2021 10:03 AM Indications: diagnostic evaluation, joint swelling and pain Details: 18 G 1.5 in needle, superolateral approach  Arthrogram: No  Medications: 5 mL lidocaine 1 % Aspirate: 50 mL cloudy; sent for lab analysis Outcome: tolerated well, no immediate complications Procedure, treatment alternatives, risks and benefits explained, specific risks discussed. Consent was given by the patient. Immediately prior to procedure a time out was called to verify the correct patient, procedure, equipment, support staff and site/side marked as required. Patient was prepped and draped in the usual sterile fashion.  Clinical Data: No additional findings.  Objective: Vital Signs: There were no vitals taken for this visit.  Physical Exam:   Constitutional: Patient appears well-developed HEENT:  Head: Normocephalic Eyes:EOM are normal Neck: Normal range of motion Cardiovascular: Normal rate Pulmonary/chest: Effort normal Neurologic: Patient is alert Skin: Skin is warm Psychiatric: Patient has normal mood and affect    Ortho Exam: Orthopedic exam demonstrates moderate effusion of the right knee with no proximal lymphadenopathy.  Range of motion is painful.  There is warmth to the right knee compared to left.   No other real joint complaints swelling or synovitis in other lower or upper extremity joints.  Pedal pulses palpable.  Collateral crucial ligaments are stable.  On alignment is intact.  Range of motion diminished to about 0 degrees to 65 degrees of flexion.  Specialty Comments:  No specialty comments available.  Imaging: No results found.   PMFS History: Patient Active Problem List   Diagnosis Date Noted  . Synovitis of right knee 04/04/2020  . Long term (current) use of anticoagulants [Z79.01] 03/14/2016  . S/P minimally invasive mitral valve repair 03/06/2016  . Pancreatic mass 01/29/2016  . Thyroid nodule 01/29/2016  . Exertional dyspnea 01/05/2016  . 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 Mitral insufficiency   . Mitral valve prolapse 12/26/2015  . Chest pain 11/02/2015  . Anxiety 11/02/2015   Past Medical History:  Diagnosis Date  . Anxiety   . Arthritis    "back?" (11/03/2015)  . Chest pain 11/02/2015  . Depression   . Exertional dyspnea 01/05/2016  . Heart murmur   . History of peptic ulcer "late 1970's"  . Migraine    "maybe 3-4/yr now" (11/03/2015)  . 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 Mitral valve prolapse 12/26/2015   symptomatic"MVP surgery postponed to get lesion of pancreas evaluated first".  . Pancreatic mass 01/29/2016   2.8 cm enhancing mass in the pancreatic neck and 3.1 cm low-attenuation cystic lesion in body of pancreas noted on CT angiogram  . PONV (postoperative nausea and vomiting)   . S/P minimally invasive mitral valve repair 03/06/2016   Complex valvuloplasty including triangular resection of posterior leaflet, artificial Gore-tex neochord placement x6 and 34 mm Memo 3D ring annuloplasty via right mini thoracotomy approach with clipping of LA appendage  . Thyroid nodule 01/29/2016   2.4 cm enhancing mixed cystic and solid nodule inferior left thyroid gland noted on CT angiogram    Family History  Problem Relation  Age of Onset  . Hypertension Mother   . Cancer Mother 60       MELANOMA  . Cancer Father        PROSTATE  . Cancer Sister 34       BREAST    Past Surgical History:  Procedure Laterality Date  . BACK SURGERY    . BREAST BIOPSY Left ~ 2005  . BREAST LUMPECTOMY Left ~ 2005  . CARDIAC CATHETERIZATION N/A 01/16/2016   Procedure: Right/Left Heart Cath and Coronary Angiography;  Surgeon: Sanda Klein, MD;  Location: [facility];  Service: Cardiovascular;  Laterality: N/A;  . CLIPPING OF ATRIAL APPENDAGE Left 03/06/2016   Procedure: CLIPPING OF LEFT ATRIAL APPENDAGE using a 42 PRO2 AtriClip;  Surgeon: Rexene Alberts, MD;  Location: [facility];  Service: Open Heart Surgery;  Laterality: Left;  . COLONOSCOPY    . EUS N/A 02/20/2016   Procedure: ESOPHAGEAL ENDOSCOPIC ULTRASOUND (EUS) RADIAL;  Surgeon: Arta Silence, MD;  Location:  [facility];  Service: Endoscopy;  Laterality: N/A;  . EYE MUSCLE SURGERY Bilateral 1970's?  Marland Kitchen KNEE ARTHROSCOPY Right 04/04/2020   Procedure: RIGHT KNEE ARTHROSCOPY WITH DEBRIDEMENT;  Surgeon: Meredith Pel, MD;  Location: [facility];  Service: Orthopedics;  Laterality: Right;  . [place]   "trimmed bulges off both sides"  . MITRAL VALVE REPAIR Right 03/06/2016   Procedure: MINIMALLY INVASIVE MITRAL VALVE REPAIR (MVR) using a 34 Sorin Memo 3D Ring;  Surgeon: Rexene Alberts, MD;  Location: [facility];  Service: Open Heart Surgery;  Laterality: Right;  . TEE WITHOUT CARDIOVERSION N/A 01/05/2016   Procedure: TRANSESOPHAGEAL ECHOCARDIOGRAM (TEE);  Surgeon: Sanda Klein, MD;  Location: [facility];  Service: Cardiovascular;  Laterality: N/A;  . TEE WITHOUT CARDIOVERSION N/A 03/06/2016   Procedure: TRANSESOPHAGEAL ECHOCARDIOGRAM (TEE);  Surgeon: Rexene Alberts, MD;  Location: [facility];  Service: Open Heart Surgery;  Laterality: N/A;  . TUBAL LIGATION     Social History   Occupational History  . Not on file  Tobacco Use  . Smoking status: Never Smoker  .  Smokeless tobacco: Never Used  Vaping Use  . Vaping Use: Never used  Substance and Sexual Activity  . Alcohol use: Yes    Alcohol/week: 10.0 standard drinks    Types: 6 Glasses of wine, 4 Shots of liquor per week    Comment: occasional  . Drug use: No  . Sexual activity: Not Currently

## 2021-04-09 ENCOUNTER — Other Ambulatory Visit: Payer: Self-pay

## 2021-04-09 ENCOUNTER — Encounter (HOSPITAL_COMMUNITY): Payer: Self-pay | Admitting: Orthopedic Surgery

## 2021-04-09 NOTE — Progress Notes (Signed)
Spoke with pt for pre-op call. Pt has hx of Mitral valve prolapse with mitral valve repair. She states she has not had any other heart problems since. She no longer sees Dr. Royann Shivers. Pt states she is not diabetic.  Covid test to be done day of surgery. Pt was a late add on today.

## 2021-04-10 ENCOUNTER — Encounter (HOSPITAL_COMMUNITY)
Admission: RE | Disposition: A | Discharge status: Home Health | Payer: Self-pay | Source: Home / Self Care | Attending: Orthopedic Surgery

## 2021-04-10 ENCOUNTER — Ambulatory Visit (HOSPITAL_COMMUNITY): Payer: Medicare Other | Admitting: Anesthesiology

## 2021-04-10 ENCOUNTER — Encounter (HOSPITAL_COMMUNITY): Payer: Self-pay | Admitting: Orthopedic Surgery

## 2021-04-10 ENCOUNTER — Other Ambulatory Visit: Payer: Self-pay

## 2021-04-10 ENCOUNTER — Inpatient Hospital Stay (HOSPITAL_COMMUNITY)
Admission: RE | Admit: 2021-04-10 | Discharge: 2021-04-12 | DRG: 487 | Disposition: A | Discharge status: Home Health | Payer: Medicare Other | Attending: Orthopedic Surgery | Admitting: Orthopedic Surgery

## 2021-04-10 DIAGNOSIS — M1711 Unilateral primary osteoarthritis, right knee: Secondary | ICD-10-CM | POA: Diagnosis present

## 2021-04-10 DIAGNOSIS — K219 Gastro-esophageal reflux disease without esophagitis: Secondary | ICD-10-CM | POA: Diagnosis present

## 2021-04-10 DIAGNOSIS — Z803 Family history of malignant neoplasm of breast: Secondary | ICD-10-CM

## 2021-04-10 DIAGNOSIS — M009 Pyogenic arthritis, unspecified: Secondary | ICD-10-CM | POA: Diagnosis not present

## 2021-04-10 DIAGNOSIS — M65861 Other synovitis and tenosynovitis, right lower leg: Secondary | ICD-10-CM | POA: Diagnosis present

## 2021-04-10 DIAGNOSIS — Z808 Family history of malignant neoplasm of other organs or systems: Secondary | ICD-10-CM

## 2021-04-10 DIAGNOSIS — Z8249 Family history of ischemic heart disease and other diseases of the circulatory system: Secondary | ICD-10-CM

## 2021-04-10 DIAGNOSIS — Z8711 Personal history of peptic ulcer disease: Secondary | ICD-10-CM

## 2021-04-10 DIAGNOSIS — E876 Hypokalemia: Secondary | ICD-10-CM | POA: Diagnosis present

## 2021-04-10 DIAGNOSIS — M25561 Pain in right knee: Secondary | ICD-10-CM | POA: Diagnosis not present

## 2021-04-10 DIAGNOSIS — M25461 Effusion, right knee: Secondary | ICD-10-CM | POA: Diagnosis present

## 2021-04-10 DIAGNOSIS — F32A Depression, unspecified: Secondary | ICD-10-CM | POA: Diagnosis present

## 2021-04-10 DIAGNOSIS — Z7982 Long term (current) use of aspirin: Secondary | ICD-10-CM

## 2021-04-10 DIAGNOSIS — Z79899 Other long term (current) drug therapy: Secondary | ICD-10-CM

## 2021-04-10 DIAGNOSIS — Z20822 Contact with and (suspected) exposure to covid-19: Secondary | ICD-10-CM | POA: Diagnosis present

## 2021-04-10 HISTORY — DX: Gastro-esophageal reflux disease without esophagitis: K21.9

## 2021-04-10 HISTORY — PX: KNEE ARTHROSCOPY: SHX127

## 2021-04-10 HISTORY — DX: Anemia, unspecified: D64.9

## 2021-04-10 LAB — BASIC METABOLIC PANEL
Anion gap: 10 (ref 5–15)
BUN: 11 mg/dL (ref 8–23)
CO2: 24 mmol/L (ref 22–32)
Calcium: 8.9 mg/dL (ref 8.9–10.3)
Chloride: 108 mmol/L (ref 98–111)
Creatinine, Ser: 0.61 mg/dL (ref 0.44–1.00)
GFR, Estimated: >60 mL/min (ref >60)
Glucose, Bld: 102 mg/dL — ABNORMAL HIGH (ref 70–99)
Potassium: 2.8 mmol/L — ABNORMAL LOW (ref 3.5–5.1)
Sodium: 142 mmol/L (ref 135–145)

## 2021-04-10 LAB — SARS CORONAVIRUS 2 BY RT PCR (HOSPITAL ORDER, PERFORMED IN ~~LOC~~ HOSPITAL LAB): SARS Coronavirus 2: NEGATIVE

## 2021-04-10 SURGERY — ARTHROSCOPY, KNEE
Anesthesia: General | Site: Knee | Laterality: Right

## 2021-04-10 MED ORDER — METHOCARBAMOL 1000 MG/10ML IJ SOLN
500.0000 mg | Freq: Four times a day (QID) | INTRAVENOUS | Status: DC | PRN
  Filled 2021-04-10: qty 5

## 2021-04-10 MED ORDER — DOCUSATE SODIUM 100 MG PO CAPS
100.0000 mg | ORAL_CAPSULE | Freq: Two times a day (BID) | ORAL | Status: DC
  Administered 2021-04-10 – 2021-04-12 (×3): 100 mg via ORAL
  Filled 2021-04-10 (×4): qty 1

## 2021-04-10 MED ORDER — ACETAMINOPHEN 10 MG/ML IV SOLN: 1000.0000 mg | Freq: Once | INTRAVENOUS | Status: DC | PRN

## 2021-04-10 MED ORDER — ACETAMINOPHEN 325 MG PO TABS: 325.0000 mg | ORAL_TABLET | Freq: Once | ORAL | Status: DC | PRN

## 2021-04-10 MED ORDER — CLONIDINE HCL (ANALGESIA) 100 MCG/ML EP SOLN
EPIDURAL | Status: DC | PRN
  Administered 2021-04-10: .75 mL via INTRA_ARTICULAR

## 2021-04-10 MED ORDER — PROPOFOL 10 MG/ML IV BOLUS
INTRAVENOUS | Status: DC | PRN
  Administered 2021-04-10: 50 mg via INTRAVENOUS
  Administered 2021-04-10: 150 mg via INTRAVENOUS

## 2021-04-10 MED ORDER — METOCLOPRAMIDE HCL 5 MG PO TABS
5.0000 mg | ORAL_TABLET | Freq: Three times a day (TID) | ORAL | Status: DC | PRN
Start: 2021-04-10 — End: 2021-04-12

## 2021-04-10 MED ORDER — POTASSIUM CHLORIDE 10 MEQ/100ML IV SOLN
10.0000 meq | Freq: Once | INTRAVENOUS | Status: AC
  Administered 2021-04-10: 10 meq via INTRAVENOUS
  Filled 2021-04-10: qty 100

## 2021-04-10 MED ORDER — KETOROLAC TROMETHAMINE 30 MG/ML IJ SOLN
INTRAMUSCULAR | Status: DC | PRN
  Administered 2021-04-10: 30 mg via INTRAVENOUS

## 2021-04-10 MED ORDER — METOCLOPRAMIDE HCL 5 MG/ML IJ SOLN: 5.0000 mg | Freq: Three times a day (TID) | INTRAMUSCULAR | Status: DC | PRN

## 2021-04-10 MED ORDER — SODIUM CHLORIDE 0.9 % IR SOLN
Status: DC | PRN
  Administered 2021-04-10 (×4): 3000 mL

## 2021-04-10 MED ORDER — CLONIDINE HCL (ANALGESIA) 100 MCG/ML EP SOLN
EPIDURAL | Status: AC
  Filled 2021-04-10: qty 10

## 2021-04-10 MED ORDER — VANCOMYCIN HCL 1000 MG IV SOLR
INTRAVENOUS | Status: DC | PRN
  Administered 2021-04-10: 1000 mg

## 2021-04-10 MED ORDER — OXYCODONE HCL 5 MG PO TABS: 5.0000 mg | ORAL_TABLET | Freq: Once | ORAL | Status: DC | PRN

## 2021-04-10 MED ORDER — CEFAZOLIN SODIUM-DEXTROSE 1-4 GM/50ML-% IV SOLN
1.0000 g | Freq: Three times a day (TID) | INTRAVENOUS | Status: DC
  Administered 2021-04-11 (×2): 1 g via INTRAVENOUS
  Filled 2021-04-10 (×2): qty 50

## 2021-04-10 MED ORDER — LACTATED RINGERS IV SOLN: INTRAVENOUS | Status: AC

## 2021-04-10 MED ORDER — ASPIRIN EC 81 MG PO TBEC
81.0000 mg | DELAYED_RELEASE_TABLET | Freq: Every day | ORAL | Status: DC
  Administered 2021-04-11 – 2021-04-12 (×2): 81 mg via ORAL
  Filled 2021-04-10 (×2): qty 1

## 2021-04-10 MED ORDER — GABAPENTIN 300 MG PO CAPS
600.0000 mg | ORAL_CAPSULE | Freq: Every day | ORAL | Status: DC
  Administered 2021-04-10 – 2021-04-11 (×2): 600 mg via ORAL
  Filled 2021-04-10 (×2): qty 2

## 2021-04-10 MED ORDER — POVIDONE-IODINE 7.5 % EX SOLN
Freq: Once | CUTANEOUS | Status: DC
  Filled 2021-04-10: qty 118

## 2021-04-10 MED ORDER — OXYCODONE HCL 5 MG PO TABS
5.0000 mg | ORAL_TABLET | ORAL | Status: DC | PRN
  Administered 2021-04-10: 5 mg via ORAL
  Administered 2021-04-11 – 2021-04-12 (×4): 10 mg via ORAL
  Filled 2021-04-10: qty 2
  Filled 2021-04-10: qty 1
  Filled 2021-04-10 (×3): qty 2

## 2021-04-10 MED ORDER — ACETAMINOPHEN 325 MG PO TABS: 325.0000 mg | ORAL_TABLET | Freq: Four times a day (QID) | ORAL | Status: DC | PRN

## 2021-04-10 MED ORDER — ONDANSETRON HCL 4 MG/2ML IJ SOLN: 4.0000 mg | Freq: Four times a day (QID) | INTRAMUSCULAR | Status: DC | PRN

## 2021-04-10 MED ORDER — LACTATED RINGERS IV SOLN: INTRAVENOUS | Status: DC

## 2021-04-10 MED ORDER — KETOROLAC TROMETHAMINE 30 MG/ML IJ SOLN
INTRAMUSCULAR | Status: AC
  Filled 2021-04-10: qty 1

## 2021-04-10 MED ORDER — CEFAZOLIN SODIUM-DEXTROSE 2-3 GM-%(50ML) IV SOLR
INTRAVENOUS | Status: DC | PRN
  Administered 2021-04-10: 2 g via INTRAVENOUS

## 2021-04-10 MED ORDER — FENTANYL CITRATE (PF) 100 MCG/2ML IJ SOLN: 25.0000 ug | INTRAMUSCULAR | Status: DC | PRN

## 2021-04-10 MED ORDER — POVIDONE-IODINE 10 % EX SWAB
2.0000 "application " | Freq: Once | CUTANEOUS | Status: AC
  Administered 2021-04-10: 2 via TOPICAL

## 2021-04-10 MED ORDER — CEFAZOLIN SODIUM-DEXTROSE 2-4 GM/100ML-% IV SOLN
INTRAVENOUS | Status: AC
  Filled 2021-04-10: qty 100

## 2021-04-10 MED ORDER — EPINEPHRINE PF 1 MG/ML IJ SOLN
INTRAMUSCULAR | Status: AC
  Filled 2021-04-10: qty 1

## 2021-04-10 MED ORDER — ONDANSETRON HCL 4 MG/2ML IJ SOLN
INTRAMUSCULAR | Status: DC | PRN
  Administered 2021-04-10: 4 mg via INTRAVENOUS

## 2021-04-10 MED ORDER — MORPHINE SULFATE 4 MG/ML IJ SOLN
INTRAMUSCULAR | Status: DC | PRN
  Administered 2021-04-10: 8 mg via SUBCUTANEOUS

## 2021-04-10 MED ORDER — BUPIVACAINE-EPINEPHRINE (PF) 0.25% -1:200000 IJ SOLN
INTRAMUSCULAR | Status: AC
  Filled 2021-04-10: qty 30

## 2021-04-10 MED ORDER — PROPOFOL 10 MG/ML IV BOLUS
INTRAVENOUS | Status: AC
  Filled 2021-04-10: qty 20

## 2021-04-10 MED ORDER — ACETAMINOPHEN 160 MG/5ML PO SOLN: 325.0000 mg | Freq: Once | ORAL | Status: DC | PRN

## 2021-04-10 MED ORDER — GENTAMICIN SULFATE 40 MG/ML IJ SOLN
INTRAMUSCULAR | Status: DC | PRN
  Administered 2021-04-10: 240 mg

## 2021-04-10 MED ORDER — NAPROXEN 250 MG PO TABS
250.0000 mg | ORAL_TABLET | Freq: Two times a day (BID) | ORAL | Status: DC
  Administered 2021-04-11 – 2021-04-12 (×3): 250 mg via ORAL
  Filled 2021-04-10 (×3): qty 1

## 2021-04-10 MED ORDER — CHLORHEXIDINE GLUCONATE 0.12 % MT SOLN
OROMUCOSAL | Status: AC
  Administered 2021-04-10: 15 mL
  Filled 2021-04-10: qty 15

## 2021-04-10 MED ORDER — VANCOMYCIN HCL 1000 MG IV SOLR
INTRAVENOUS | Status: AC
  Filled 2021-04-10: qty 1000

## 2021-04-10 MED ORDER — METHOCARBAMOL 500 MG PO TABS
500.0000 mg | ORAL_TABLET | Freq: Four times a day (QID) | ORAL | Status: DC | PRN
  Administered 2021-04-10: 500 mg via ORAL
  Filled 2021-04-10: qty 1

## 2021-04-10 MED ORDER — HYDROMORPHONE HCL 1 MG/ML IJ SOLN: 0.5000 mg | INTRAMUSCULAR | Status: DC | PRN

## 2021-04-10 MED ORDER — ONDANSETRON HCL 4 MG PO TABS: 4.0000 mg | ORAL_TABLET | Freq: Four times a day (QID) | ORAL | Status: DC | PRN

## 2021-04-10 MED ORDER — FENTANYL CITRATE (PF) 250 MCG/5ML IJ SOLN
INTRAMUSCULAR | Status: DC | PRN
  Administered 2021-04-10 (×2): 50 ug via INTRAVENOUS
  Administered 2021-04-10 (×3): 25 ug via INTRAVENOUS
  Administered 2021-04-10: 50 ug via INTRAVENOUS
  Administered 2021-04-10: 25 ug via INTRAVENOUS

## 2021-04-10 MED ORDER — DEXAMETHASONE SODIUM PHOSPHATE 10 MG/ML IJ SOLN
INTRAMUSCULAR | Status: DC | PRN
  Administered 2021-04-10: 10 mg via INTRAVENOUS

## 2021-04-10 MED ORDER — FENTANYL CITRATE (PF) 250 MCG/5ML IJ SOLN
INTRAMUSCULAR | Status: AC
  Filled 2021-04-10: qty 5

## 2021-04-10 MED ORDER — EPINEPHRINE PF 1 MG/ML IJ SOLN
INTRAMUSCULAR | Status: AC
  Filled 2021-04-10: qty 3

## 2021-04-10 MED ORDER — BUPIVACAINE HCL (PF) 0.25 % IJ SOLN
INTRAMUSCULAR | Status: DC | PRN
  Administered 2021-04-10: 30 mL

## 2021-04-10 MED ORDER — POTASSIUM CHLORIDE 10 MEQ/100ML IV SOLN
INTRAVENOUS | Status: AC
  Administered 2021-04-10: 10 meq via INTRAVENOUS
  Filled 2021-04-10: qty 100

## 2021-04-10 MED ORDER — EPINEPHRINE PF 1 MG/ML IJ SOLN
INTRAMUSCULAR | Status: DC | PRN
  Administered 2021-04-10 (×4): 1 mg via SUBCUTANEOUS

## 2021-04-10 MED ORDER — LACTATED RINGERS IV SOLN: INTRAVENOUS | Status: DC | PRN

## 2021-04-10 MED ORDER — LIDOCAINE 2% (20 MG/ML) 5 ML SYRINGE
INTRAMUSCULAR | Status: DC | PRN
  Administered 2021-04-10: 100 mg via INTRAVENOUS

## 2021-04-10 MED ORDER — GENTAMICIN SULFATE 40 MG/ML IJ SOLN
INTRAMUSCULAR | Status: AC
  Filled 2021-04-10: qty 8

## 2021-04-10 MED ORDER — EPINEPHRINE PF 1 MG/ML IJ SOLN
INTRAMUSCULAR | Status: AC
  Filled 2021-04-10: qty 2

## 2021-04-10 MED ORDER — MORPHINE SULFATE (PF) 4 MG/ML IV SOLN
INTRAVENOUS | Status: AC
  Filled 2021-04-10: qty 2

## 2021-04-10 MED ORDER — BUPIVACAINE HCL (PF) 0.25 % IJ SOLN
INTRAMUSCULAR | Status: AC
  Filled 2021-04-10: qty 30

## 2021-04-10 MED ORDER — MEPERIDINE HCL 25 MG/ML IJ SOLN: 6.2500 mg | INTRAMUSCULAR | Status: DC | PRN

## 2021-04-10 MED ORDER — OXYCODONE HCL 5 MG/5ML PO SOLN: 5.0000 mg | Freq: Once | ORAL | Status: DC | PRN

## 2021-04-10 MED ORDER — POTASSIUM CHLORIDE 10 MEQ/100ML IV SOLN
10.0000 meq | Freq: Once | INTRAVENOUS | Status: AC
  Filled 2021-04-10: qty 100

## 2021-04-10 SURGICAL SUPPLY — 52 items
BANDAGE ESMARK 6X9 LF (GAUZE/BANDAGES/DRESSINGS) IMPLANT
BLADE CLIPPER SURG (BLADE) IMPLANT
BLADE EXCALIBUR 4.0X13 (MISCELLANEOUS) ×2 IMPLANT
BNDG ELASTIC 6X10 VLCR STRL LF (GAUZE/BANDAGES/DRESSINGS) ×2 IMPLANT
BNDG ESMARK 6X9 LF (GAUZE/BANDAGES/DRESSINGS)
COVER SURGICAL LIGHT HANDLE (MISCELLANEOUS) ×2 IMPLANT
COVER WAND RF STERILE (DRAPES) ×2 IMPLANT
CUFF TOURN SGL QUICK 34 (TOURNIQUET CUFF)
CUFF TOURN SGL QUICK 42 (TOURNIQUET CUFF) IMPLANT
CUFF TRNQT CYL 34X4.125X (TOURNIQUET CUFF) IMPLANT
DRAPE ARTHROSCOPY W/POUCH 114 (DRAPES) ×2 IMPLANT
DRAPE U-SHAPE 47X51 STRL (DRAPES) ×2 IMPLANT
DRSG TEGADERM 4X4.5 CHG (GAUZE/BANDAGES/DRESSINGS) ×8 IMPLANT
DRSG TEGADERM 4X4.75 (GAUZE/BANDAGES/DRESSINGS) ×2 IMPLANT
DRSG XEROFORM 1X8 (GAUZE/BANDAGES/DRESSINGS) ×2 IMPLANT
DURAPREP 26ML APPLICATOR (WOUND CARE) ×2 IMPLANT
DW OUTFLOW CASSETTE/TUBE SET (MISCELLANEOUS) ×2 IMPLANT
EVACUATOR 1/8 PVC DRAIN (DRAIN) ×2 IMPLANT
GAUZE SPONGE 4X4 12PLY STRL (GAUZE/BANDAGES/DRESSINGS) ×2 IMPLANT
GAUZE XEROFORM 1X8 LF (GAUZE/BANDAGES/DRESSINGS) IMPLANT
GLOVE ECLIPSE 8.0 STRL XLNG CF (GLOVE) ×2 IMPLANT
GLOVE SRG 8 PF TXTR STRL LF DI (GLOVE) ×1 IMPLANT
GLOVE SURG UNDER POLY LF SZ8 (GLOVE) ×2
GOWN STRL REUS W/ TWL LRG LVL3 (GOWN DISPOSABLE) ×2 IMPLANT
GOWN STRL REUS W/ TWL XL LVL3 (GOWN DISPOSABLE) ×1 IMPLANT
GOWN STRL REUS W/TWL LRG LVL3 (GOWN DISPOSABLE) ×4
GOWN STRL REUS W/TWL XL LVL3 (GOWN DISPOSABLE) ×2
IMMOBILIZER KNEE 22 UNIV (SOFTGOODS) ×2 IMPLANT
KIT BASIN OR (CUSTOM PROCEDURE TRAY) ×2 IMPLANT
KIT STIMULAN RAPID CURE 5CC (Orthopedic Implant) ×2 IMPLANT
KIT TURNOVER KIT B (KITS) ×2 IMPLANT
MANIFOLD NEPTUNE II (INSTRUMENTS) ×2 IMPLANT
NEEDLE 18GX1X1/2 (RX/OR ONLY) (NEEDLE) IMPLANT
NEEDLE HYPO 25GX1X1/2 BEV (NEEDLE) ×2 IMPLANT
NS IRRIG 1000ML POUR BTL (IV SOLUTION) IMPLANT
PACK ARTHROSCOPY DSU (CUSTOM PROCEDURE TRAY) ×2 IMPLANT
PAD ABD 8X10 STRL (GAUZE/BANDAGES/DRESSINGS) ×2 IMPLANT
PAD ARMBOARD 7.5X6 YLW CONV (MISCELLANEOUS) ×4 IMPLANT
PADDING CAST COTTON 6X4 STRL (CAST SUPPLIES) ×2 IMPLANT
PORT APPOLLO RF 90DEGREE MULTI (SURGICAL WAND) IMPLANT
SPONGE LAP 4X18 RFD (DISPOSABLE) ×2 IMPLANT
SUT ETHILON 3 0 PS 1 (SUTURE) ×4 IMPLANT
SUT VIC AB 2-0 SH 27 (SUTURE) ×2
SUT VIC AB 2-0 SH 27XBRD (SUTURE) ×1 IMPLANT
SYR 20ML ECCENTRIC (SYRINGE) ×2 IMPLANT
SYR CONTROL 10ML LL (SYRINGE) IMPLANT
SYR TB 1ML LUER SLIP (SYRINGE) ×2 IMPLANT
TOWEL GREEN STERILE (TOWEL DISPOSABLE) ×2 IMPLANT
TOWEL GREEN STERILE FF (TOWEL DISPOSABLE) ×2 IMPLANT
TUBE CONNECTING 12X1/4 (SUCTIONS) ×2 IMPLANT
TUBING ARTHROSCOPY IRRIG 16FT (MISCELLANEOUS) ×2 IMPLANT
WATER STERILE IRR 1000ML POUR (IV SOLUTION) ×2 IMPLANT

## 2021-04-10 NOTE — Brief Op Note (Signed)
   04/10/2021  5:40 PM  PATIENT:  Becky Murphy  70 y.o. female  PRE-OPERATIVE DIAGNOSIS:  right knee infection  POST-OPERATIVE DIAGNOSIS:  right knee infection  PROCEDURE:  Procedure(s): right knee arthroscopy, debridement, placement of antibiotic beads  SURGEON:  Surgeon(s): August Saucer, Corrie Mckusick, MD  ASSISTANT: magnant pa  ANESTHESIA:   general  EBL: 15 ml    Total I/O In: 550 [I.V.:400; IV Piggyback:150] Out: 5 [Blood:5]  BLOOD ADMINISTERED: none  DRAINS: (right knee ) Hemovact drain(s) in the right knee with  Suction Clamped   LOCAL MEDICATIONS USED: Stimulant beads along with Marcaine morphine clonidine  SPECIMEN:   COUNTS:  YES  TOURNIQUET:  * Missing tourniquet times found for documented tourniquets in log: [id number] *  DICTATION: .Other Dictation: Dictation Number [id number]  PLAN OF CARE: Admit for overnight observation  PATIENT DISPOSITION:  PACU - hemodynamically stable

## 2021-04-10 NOTE — Anesthesia Procedure Notes (Signed)
Procedure Name: LMA Insertion Date/Time: 04/10/2021 4:09 PM Performed by: Marny Lowenstein, CRNA Pre-anesthesia Checklist: Patient identified, Emergency Drugs available, Suction available and Patient being monitored Patient Re-evaluated:Patient Re-evaluated prior to induction Oxygen Delivery Method: Circle system utilized Preoxygenation: Pre-oxygenation with 100% oxygen Induction Type: IV induction Ventilation: Mask ventilation without difficulty LMA: LMA inserted LMA Size: 4.0 Number of attempts: 1 Placement Confirmation: positive ETCO2 and breath sounds checked- equal and bilateral Tube secured with: Tape Dental Injury: Teeth and Oropharynx as per pre-operative assessment

## 2021-04-10 NOTE — Transfer of Care (Signed)
Immediate Anesthesia Transfer of Care Note  Patient: Becky Murphy  Procedure(s) Performed: right knee arthroscopy, debridement, placement of antibiotic beads (Right Knee)  Patient Location: PACU  Anesthesia Type:General  Level of Consciousness: drowsy and patient cooperative  Airway & Oxygen Therapy: Patient Spontanous Breathing and Patient connected to face mask oxygen  Post-op Assessment: Report given to RN and Post -op Vital signs reviewed and stable  Post vital signs: Reviewed and stable  Last Vitals:  Vitals Value Taken Time  BP 119/82 04/10/21 1745  Temp    Pulse 70 04/10/21 1750  Resp 10 04/10/21 1750  SpO2 93 % 04/10/21 1750  Vitals shown include unvalidated device data.  Last Pain:  Vitals:   04/10/21 1216  TempSrc: Oral  PainSc: 9          Complications: No complications documented.

## 2021-04-10 NOTE — Progress Notes (Signed)
Spoke to Dr. Hart Rochester regarding K+ 2.8.  Order for Potassium ordered.

## 2021-04-10 NOTE — Anesthesia Preprocedure Evaluation (Signed)
Anesthesia Evaluation  Patient identified by MRN, date of birth, ID band Patient awake    Reviewed: Allergy & Precautions, NPO status , Patient's Chart, lab work & pertinent test results  History of Anesthesia Complications (+) PONV and history of anesthetic complications  Airway Mallampati: II  TM Distance: >3 FB Neck ROM: Full    Dental  (+) Teeth Intact, Dental Advisory Given   Pulmonary neg pulmonary ROS,    breath sounds clear to auscultation       Cardiovascular  Rhythm:Regular Rate:Normal  - s/p MINI 2017   Neuro/Psych  Headaches, PSYCHIATRIC DISORDERS Anxiety Depression    GI/Hepatic Neg liver ROS, GERD  ,  Endo/Other  negative endocrine ROS  Renal/GU negative Renal ROS     Musculoskeletal  (+) Arthritis ,   Abdominal   Peds  Hematology   Anesthesia Other Findings   Reproductive/Obstetrics                             Anesthesia Physical  Anesthesia Plan  ASA: II  Anesthesia Plan: General   Post-op Pain Management:    Induction: Intravenous  PONV Risk Score and Plan: Ondansetron, Dexamethasone, Midazolam and Scopolamine patch - Pre-op  Airway Management Planned: LMA  Additional Equipment:   Intra-op Plan:   Post-operative Plan: Extubation in OR  Informed Consent: I have reviewed the patients History and Physical, chart, labs and discussed the procedure including the risks, benefits and alternatives for the proposed anesthesia with the patient or authorized representative who has indicated his/her understanding and acceptance.     Dental advisory given  Plan Discussed with: CRNA and Anesthesiologist  Anesthesia Plan Comments:        Anesthesia Quick Evaluation

## 2021-04-10 NOTE — Plan of Care (Signed)
  Problem: Clinical Measurements: Goal: Ability to maintain clinical measurements within normal limits will improve Outcome: Progressing Goal: Will remain free from infection Outcome: Progressing   

## 2021-04-10 NOTE — H&P (Signed)
Becky Murphy is an 70 y.o. female.   Chief Complaint: Right knee pain HPI: Becky Murphy is a 70 year old female with right knee pain.  Last year she had similar problem with large effusion present.  This is refractory to nonoperative management.  White count was high in that fluid last year.  She underwent I&D and prolonged course of IV and oral antibiotics.  Did have some trouble with the PICC line.  Did well with pain until about 2 weeks ago.  Then she developed recurrent swelling and significant increase in pain in the knee.  Aspiration 2 weeks ago demonstrated white count of 25,000 with elevated sed rate and C-reactive protein.  Repeated the aspiration and the white count in the fluid increased to 45,000 with very high CRP over 100 and elevated sed rate.  Nothing is grown from culture.  Crystals are negative in both aspirates.  Patient is having debilitating pain.  No discrete fevers and chills but the pain is severe.  Patient has progressed with her arthritis radiographically.  Denies any other joint complaints.  Past Medical History:  Diagnosis Date  . Anemia    low iron in the past  . Anxiety    pt denies  . Arthritis    "back?" (11/03/2015)  . Chest pain 11/02/2015  . Depression    pt denies  . Exertional dyspnea 01/05/2016  . GERD (gastroesophageal reflux disease)   . Heart murmur   . History of peptic ulcer "late 1970's"  . Migraine    "maybe 3-4/yr now" (11/03/2015)  . 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 Mitral valve prolapse 12/26/2015   symptomatic"MVP surgery postponed to get lesion of pancreas evaluated first".  . Pancreatic mass 01/29/2016   2.8 cm enhancing mass in the pancreatic neck and 3.1 cm low-attenuation cystic lesion in body of pancreas noted on CT angiogram  . PONV (postoperative nausea and vomiting)   . S/P minimally invasive mitral valve repair 03/06/2016   Complex valvuloplasty including triangular resection of posterior leaflet, artificial Gore-tex  neochord placement x6 and 34 mm Memo 3D ring annuloplasty via right mini thoracotomy approach with clipping of LA appendage  . Thyroid nodule 01/29/2016   2.4 cm enhancing mixed cystic and solid nodule inferior left thyroid gland noted on CT angiogram    Past Surgical History:  Procedure Laterality Date  . BACK SURGERY    . BREAST BIOPSY Left ~ 2005  . BREAST LUMPECTOMY Left ~ 2005  . CARDIAC CATHETERIZATION N/A 01/16/2016   Procedure: Right/Left Heart Cath and Coronary Angiography;  Surgeon: Thurmon Fair, MD;  Location: [facility];  Service: Cardiovascular;  Laterality: N/A;  . CLIPPING OF ATRIAL APPENDAGE Left 03/06/2016   Procedure: CLIPPING OF LEFT ATRIAL APPENDAGE using a 45 PRO2 AtriClip;  Surgeon: Purcell Nails, MD;  Location: [facility];  Service: Open Heart Surgery;  Laterality: Left;  . COLONOSCOPY    . EUS N/A 02/20/2016   Procedure: ESOPHAGEAL ENDOSCOPIC ULTRASOUND (EUS) RADIAL;  Surgeon: Willis Modena, MD;  Location: [facility];  Service: Endoscopy;  Laterality: N/A;  . EYE MUSCLE SURGERY Bilateral 1970's?  Marland Kitchen KNEE ARTHROSCOPY Right 04/04/2020   Procedure: RIGHT KNEE ARTHROSCOPY WITH DEBRIDEMENT;  Surgeon: Cammy Copa, MD;  Location: [facility] OR;  Service: Orthopedics;  Laterality: Right;  . LUMBAR DISC SURGERY  1992   "trimmed bulges off both sides"  . MITRAL VALVE REPAIR Right 03/06/2016   Procedure: MINIMALLY INVASIVE MITRAL VALVE REPAIR (MVR) using a 34 Sorin Memo  3D Ring;  Surgeon: Purcell Nails, MD;  [facility];  Service: Open Heart Surgery;  Laterality: Right;  . TEE WITHOUT CARDIOVERSION N/A 01/05/2016   Procedure: TRANSESOPHAGEAL ECHOCARDIOGRAM (TEE);  Surgeon: Thurmon Fair, MD;  Location: [facility] ENDOSCOPY;  Service: Cardiovascular;  Laterality: N/A;  . TEE WITHOUT CARDIOVERSION N/A 03/06/2016   Procedure: TRANSESOPHAGEAL ECHOCARDIOGRAM (TEE);  Surgeon: Purcell Nails, MD;  Location: [facility] OR;  Service: Open Heart Surgery;  Laterality: N/A;  . TUBAL LIGATION       Family History  Problem Relation Age of Onset  . Hypertension Mother   . Cancer Mother 75       MELANOMA  . Cancer Father        PROSTATE  . Cancer Sister 71       BREAST   Social History:  reports that she has never smoked. She has never used smokeless tobacco. She reports current alcohol use of about 10.0 standard drinks of alcohol per week. She reports that she does not use drugs.  Allergies: No Known Allergies  Medications Prior to Admission  Medication Sig Dispense Refill  . aspirin EC 325 MG tablet Take 325 mg by mouth daily as needed (headaches).    . cholecalciferol (VITAMIN D3) 25 MCG (1000 UNIT) tablet Take 1,000 Units by mouth daily.    Marland Kitchen gabapentin (NEURONTIN) 300 MG capsule Take 600 mg by mouth at bedtime.    Marland Kitchen ibuprofen (ADVIL) 200 MG tablet Take 800 mg by mouth 2 (two) times daily.    Marland Kitchen aspirin 81 MG EC tablet Take 1 tablet (81 mg total) by mouth daily. (Patient not taking: Reported on 04/09/2021) 30 tablet   . Menthol, Topical Analgesic, ([employer] COLD & HOT MEDICATED EX) Apply 1 application topically daily as needed (pain).      Results for orders placed or performed during the hospital encounter of 04/10/21 (from the past 48 hour(s))  SARS Coronavirus 2 by RT PCR (hospital order, performed in [facility] hospital lab) Nasopharyngeal Nasopharyngeal Swab     Status: None   Collection Time: 04/10/21 11:46 AM   Specimen: Nasopharyngeal Swab  Result Value Ref Range   SARS Coronavirus 2 NEGATIVE NEGATIVE    Comment: (NOTE) SARS-CoV-2 target nucleic acids are NOT DETECTED.  The SARS-CoV-2 RNA is generally detectable in upper and lower respiratory specimens during the acute phase of infection. The lowest concentration of SARS-CoV-2 viral copies this assay can detect is 250 copies / mL. A negative result does not preclude SARS-CoV-2 infection and should not be used as the sole basis for treatment or other patient management decisions.  A negative result may occur  with improper specimen collection / handling, submission of specimen other than nasopharyngeal swab, presence of viral mutation(s) within the areas targeted by this assay, and inadequate number of viral copies (<250 copies / mL). A negative result must be combined with clinical observations, patient history, and epidemiological information.  Fact Sheet for Patients:   BoilerBrush.com.cy  Fact Sheet for Healthcare Providers: https://pope.com/  This test is not yet approved or  cleared by the [place] FDA and has been authorized for detection and/or diagnosis of SARS-CoV-2 by FDA under an Emergency Use Authorization (EUA).  This EUA will remain in effect (meaning this test can be used) for the duration of the COVID-19 declaration under Section 564(b)(1) of the Act, 21 U.S.C. section 360bbb-3(b)(1), unless the authorization is terminated or revoked sooner.  Performed at [facility] Lab, [address], [place],  [place] [address]   Basic metabolic panel     Status: Abnormal   Collection Time: 04/10/21  1:06 PM  Result Value Ref Range   Sodium 142 135 - 145 mmol/L   Potassium 2.8 (L) 3.5 - 5.1 mmol/L   Chloride 108 98 - 111 mmol/L   CO2 24 22 - 32 mmol/L   Glucose, Bld 102 (H) 70 - 99 mg/dL    Comment: Glucose reference range applies only to samples taken after fasting for at least 8 hours.   BUN 11 8 - 23 mg/dL   Creatinine, Ser 6.46 0.44 - 1.00 mg/dL   Calcium 8.9 8.9 - 80.3 mg/dL   GFR, Estimated >21 >22 mL/min    Comment: (NOTE) Calculated using the CKD-EPI Creatinine Equation (2021)    Anion gap 10 5 - 15    Comment: Performed at [facility], [address], [place], [place] [address]   No results found.  Review of Systems  Musculoskeletal: Positive for arthralgias.  All other systems reviewed and are negative.   Blood pressure (!) 156/94, pulse (!) 57, temperature 98.2 F (36.8 C), temperature source  Oral, resp. rate 18, height 5\' 6"  (1.676 m), weight 68.5 kg, SpO2 96 %. Physical Exam Vitals reviewed.  HENT:     Head: Normocephalic.     Nose: Nose normal.     Mouth/Throat:     Mouth: Mucous membranes are moist.  Eyes:     Pupils: Pupils are equal, round, and reactive to light.  Cardiovascular:     Rate and Rhythm: Normal rate.     Pulses: Normal pulses.  Pulmonary:     Effort: Pulmonary effort is normal.  Abdominal:     General: Abdomen is flat.  Musculoskeletal:     Cervical back: Normal range of motion.  Skin:    General: Skin is warm.     Capillary Refill: Capillary refill takes less than 2 seconds.  Neurological:     General: No focal deficit present.     Mental Status: She is alert.  Psychiatric:        Mood and Affect: Mood normal.   Orthopedic exam demonstrates moderate effusion in the right knee with significant pain with range of motion.  She can achieve full extension but only can bend at about 70 degrees.  Collateral crucial ligaments are stable.  No proximal lymphadenopathy is present.  Knee is warm on the right but not on the left.  No left knee effusion is present.  Ankle dorsiflexion intact.  Pedal pulses palpable.  Assessment/Plan Impression is right knee effusion with increasing white count to 45,000 from 25,002 weeks ago.  And very high sed rate C-reactive protein over 100.  Had a long talk with Marvis today.  Her arthritis is progressing but total knee replacement would be a risky proposition with potential for indolent infection present in the knee.  With her trend increasing I think the most prudent thing to do would be arthroscopic washout with antibiotic bead placement.  Patient will likely be reluctant to have a PICC line.  We will consult infectious disease for antibiotic recommendations.  Risk and benefits of surgery are discussed include not limited to infection nerve vessel damage incomplete pain relief as well as potential for more surgery in the  future.  Patient understands risk benefits and wishes to proceed.  All questions answered  Dois Davenport, MD 04/10/2021, 3:16 PM

## 2021-04-11 ENCOUNTER — Observation Stay: Payer: Self-pay

## 2021-04-11 ENCOUNTER — Encounter (HOSPITAL_COMMUNITY): Payer: Self-pay | Admitting: Orthopedic Surgery

## 2021-04-11 DIAGNOSIS — Z20822 Contact with and (suspected) exposure to covid-19: Secondary | ICD-10-CM | POA: Diagnosis not present

## 2021-04-11 DIAGNOSIS — M009 Pyogenic arthritis, unspecified: Secondary | ICD-10-CM | POA: Diagnosis present

## 2021-04-11 DIAGNOSIS — F32A Depression, unspecified: Secondary | ICD-10-CM | POA: Diagnosis not present

## 2021-04-11 DIAGNOSIS — E876 Hypokalemia: Secondary | ICD-10-CM

## 2021-04-11 DIAGNOSIS — M00861 Arthritis due to other bacteria, right knee: Secondary | ICD-10-CM | POA: Diagnosis not present

## 2021-04-11 DIAGNOSIS — Z808 Family history of malignant neoplasm of other organs or systems: Secondary | ICD-10-CM | POA: Diagnosis not present

## 2021-04-11 DIAGNOSIS — M25561 Pain in right knee: Secondary | ICD-10-CM | POA: Diagnosis not present

## 2021-04-11 DIAGNOSIS — Z7982 Long term (current) use of aspirin: Secondary | ICD-10-CM | POA: Diagnosis not present

## 2021-04-11 DIAGNOSIS — Z803 Family history of malignant neoplasm of breast: Secondary | ICD-10-CM | POA: Diagnosis not present

## 2021-04-11 DIAGNOSIS — Z8711 Personal history of peptic ulcer disease: Secondary | ICD-10-CM | POA: Diagnosis not present

## 2021-04-11 DIAGNOSIS — Z79899 Other long term (current) drug therapy: Secondary | ICD-10-CM | POA: Diagnosis not present

## 2021-04-11 DIAGNOSIS — M25461 Effusion, right knee: Secondary | ICD-10-CM | POA: Diagnosis present

## 2021-04-11 DIAGNOSIS — Z5181 Encounter for therapeutic drug level monitoring: Secondary | ICD-10-CM

## 2021-04-11 DIAGNOSIS — M1711 Unilateral primary osteoarthritis, right knee: Secondary | ICD-10-CM | POA: Diagnosis not present

## 2021-04-11 DIAGNOSIS — M65861 Other synovitis and tenosynovitis, right lower leg: Secondary | ICD-10-CM | POA: Diagnosis not present

## 2021-04-11 DIAGNOSIS — K219 Gastro-esophageal reflux disease without esophagitis: Secondary | ICD-10-CM | POA: Diagnosis not present

## 2021-04-11 DIAGNOSIS — Z8249 Family history of ischemic heart disease and other diseases of the circulatory system: Secondary | ICD-10-CM | POA: Diagnosis not present

## 2021-04-11 LAB — C-REACTIVE PROTEIN: CRP: 9.3 mg/dL — ABNORMAL HIGH (ref <1.0)

## 2021-04-11 LAB — CBC
HCT: 36.5 % (ref 36.0–46.0)
Hemoglobin: 12.3 g/dL (ref 12.0–15.0)
MCH: 30.1 pg (ref 26.0–34.0)
MCHC: 33.7 g/dL (ref 30.0–36.0)
MCV: 89.2 fL (ref 80.0–100.0)
Platelets: 352 10*3/uL (ref 150–400)
RBC: 4.09 MIL/uL (ref 3.87–5.11)
RDW: 13 % (ref 11.5–15.5)
WBC: 4.7 10*3/uL (ref 4.0–10.5)
nRBC: 0 % (ref 0.0–0.2)

## 2021-04-11 LAB — BASIC METABOLIC PANEL
Anion gap: 4 — ABNORMAL LOW (ref 5–15)
BUN: 20 mg/dL (ref 8–23)
CO2: 26 mmol/L (ref 22–32)
Calcium: 8.2 mg/dL — ABNORMAL LOW (ref 8.9–10.3)
Chloride: 110 mmol/L (ref 98–111)
Creatinine, Ser: 0.73 mg/dL (ref 0.44–1.00)
GFR, Estimated: >60 mL/min (ref >60)
Glucose, Bld: 120 mg/dL — ABNORMAL HIGH (ref 70–99)
Potassium: 4 mmol/L (ref 3.5–5.1)
Sodium: 140 mmol/L (ref 135–145)

## 2021-04-11 LAB — SEDIMENTATION RATE: Sed Rate: 45 mm/hr — ABNORMAL HIGH (ref 0–22)

## 2021-04-11 MED ORDER — SODIUM CHLORIDE 0.9% FLUSH: 10.0000 mL | INTRAVENOUS | Status: DC | PRN

## 2021-04-11 MED ORDER — POTASSIUM CHLORIDE 20 MEQ PO PACK
40.0000 meq | PACK | ORAL | Status: AC
  Administered 2021-04-11 (×2): 40 meq via ORAL
  Filled 2021-04-11 (×2): qty 2

## 2021-04-11 MED ORDER — VANCOMYCIN HCL 1500 MG/300ML IV SOLN
1500.0000 mg | Freq: Once | INTRAVENOUS | Status: AC
  Administered 2021-04-11: 1500 mg via INTRAVENOUS
  Filled 2021-04-11: qty 300

## 2021-04-11 MED ORDER — CHLORHEXIDINE GLUCONATE CLOTH 2 % EX PADS
6.0000 | MEDICATED_PAD | Freq: Every day | CUTANEOUS | Status: DC
  Administered 2021-04-11 – 2021-04-12 (×2): 6 via TOPICAL

## 2021-04-11 MED ORDER — VANCOMYCIN HCL 1500 MG/300ML IV SOLN
1500.0000 mg | INTRAVENOUS | Status: DC
  Administered 2021-04-12: 1500 mg via INTRAVENOUS
  Filled 2021-04-11: qty 300

## 2021-04-11 MED ORDER — SODIUM CHLORIDE 0.9 % IV SOLN
2.0000 g | INTRAVENOUS | Status: DC
  Administered 2021-04-11 – 2021-04-12 (×2): 2 g via INTRAVENOUS
  Filled 2021-04-11 (×2): qty 20

## 2021-04-11 NOTE — Progress Notes (Signed)
Peripherally Inserted Central Catheter Placement  The IV Nurse has discussed with the patient and/or persons authorized to consent for the patient, the purpose of this procedure and the potential benefits and risks involved with this procedure.  The benefits include less needle sticks, lab draws from the catheter, and the patient may be discharged home with the catheter. Risks include, but not limited to, infection, bleeding, blood clot (thrombus formation), and puncture of an artery; nerve damage and irregular heartbeat and possibility to perform a PICC exchange if needed/ordered by physician.  Alternatives to this procedure were also discussed.  Bard Power PICC patient education guide, fact sheet on infection prevention and patient information card has been provided to patient /or left at bedside.    PICC Placement Documentation  PICC Single Lumen 04/11/21 Left Basilic 44 cm 0 cm (Active)  Indication for Insertion or Continuance of Line Home intravenous therapies (PICC only) 04/11/21 1733  Exposed Catheter (cm) 0 cm 04/11/21 1733  Site Assessment Clean;Dry;Intact 04/11/21 1733  Line Status Flushed;Blood return noted 04/11/21 1733  Dressing Type Transparent 04/11/21 1733  Dressing Status Clean;Dry;Intact 04/11/21 1733  Antimicrobial disc in place? Yes 04/11/21 1733  Dressing Intervention New dressing;Other (Comment) 04/11/21 1733  Dressing Change Due 04/18/21 04/11/21 1733       Reginia Forts Albarece 04/11/2021, 5:34 PM

## 2021-04-11 NOTE — TOC Initial Note (Addendum)
Transition of Care [facility]) - Initial/Assessment Note    Patient Details  Name: Becky Murphy MRN: [id number] Date of Birth: [birth year]  [facility]) CM/SW Contact:    Epifanio Lesches, RN Phone Number: 04/11/2021, 12:22 PM  Clinical Narrative:                 Admitted with septic arthritis of R knee, hx of right knee septic arthritis in April of 2021. From home with husband. States independent with ADL's , uses cane , rollator intermittently with ambulation.     -s/p R knee arthroscopy with extensive debridement, synovectomy with placement of vancomycin/gentamicin, 4/12  Per ID pt will need 6 weeks IV ABX therapy. NCM shared information and  Pt stated she was aware has benefited from home infusion therapy in the past. Pt stated she has used [facility] and [facility]) in the past and is ok with using them again.  Referral made with [facility] and [facility] and botn accepted .  Pam / Julianne Rice to provide home infusion teaching to pt and husband prior to d/c.  PICC LINE pending , OPAT consult inplace  PT evaluation pending ....  TOC team will continue to monitor and assist with TOC needs....   Expected Discharge Plan: Home w Home Health Services Barriers to Discharge: Continued Medical Work up   Patient Goals and CMS Choice     Choice offered to / list presented to : Patient  Expected Discharge Plan and Services Expected Discharge Plan: Home w Home Health Services   Discharge Planning Services: CM Consult   Living arrangements for the past 2 months: Single Family Home                 DME Arranged: Other see comment ([facility] / IV ABX therapy) DME Agency: [employer] Date DME Agency Contacted: 04/11/21 Time DME Agency Contacted: 1220 Representative spoke with at DME Agency: Pam HH Arranged: RN,PT,OT HH Agency: [facility] Date [facility] Agency Contacted: 04/11/21 Time HH Agency Contacted:  1220 Representative spoke with at [facility] Agency: Kandee Keen  Prior Living Arrangements/Services Living arrangements for the past 2 months: Single Family Home Lives with:: Spouse Patient language and need for interpreter reviewed:: Yes Do you feel safe going back to the place where you live?: Yes      Need for Family Participation in Patient Care: Yes (Comment) Care giver support system in place?: Yes (comment)   Criminal Activity/Legal Involvement Pertinent to Current Situation/Hospitalization: No - Comment as needed  Activities of Daily Living Home Assistive Devices/Equipment: Walker (specify type),Contact lenses ADL Screening (condition at time of admission) Patient's cognitive ability adequate to safely complete daily activities?: Yes Is the patient deaf or have difficulty hearing?: No Does the patient have difficulty seeing, even when wearing glasses/contacts?: No Does the patient have difficulty concentrating, remembering, or making decisions?: No Patient able to express need for assistance with ADLs?: Yes Does the patient have difficulty dressing or bathing?: Yes Independently performs ADLs?: Yes (appropriate for developmental age) Does the patient have difficulty walking or climbing stairs?: Yes Weakness of Legs: Both Weakness of Arms/Hands: Both  Permission Sought/Granted Permission sought to share information with : Case Manager Permission granted to share information with : Yes, Verbal Permission Granted  Share Information with NAME: Oberia Beaudoin (Spouse) [phone]           Emotional Assessment Appearance:: Appears stated age     Orientation: : Oriented  to Self,Oriented to Place,Oriented to  Time,Oriented to Situation Alcohol / Substance Use: Not Applicable Psych Involvement: No (comment)  Admission diagnosis:  Septic arthritis of knee, right [facility]) [M00.9] Patient Active Problem List   Diagnosis Date Noted  . Septic arthritis of knee, right (HCC) 04/10/2021  .  Synovitis of right knee 04/04/2020  . Long term (current) use of anticoagulants [Z79.01] 03/14/2016  . S/P minimally invasive mitral valve repair 03/06/2016  . Pancreatic mass 01/29/2016  . Thyroid nodule 01/29/2016  . Exertional dyspnea 01/05/2016  . 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 Mitral insufficiency   . Mitral valve prolapse 12/26/2015  . Chest pain 11/02/2015  . Anxiety 11/02/2015   PCP:  Kaleen Mask, MD Pharmacy:   [facility] [phone] - [address], [place] - [address] [place] [address] Phone: [phone] Fax: [phone]  [facility] [place] - [address] [address] [address] [place] [address] Phone: [phone] Fax: [phone]     Social Determinants of Health (SDOH) Interventions    Readmission Risk Interventions No flowsheet data found.

## 2021-04-11 NOTE — Anesthesia Postprocedure Evaluation (Signed)
Anesthesia Post Note  Patient: Becky Murphy  Procedure(s) Performed: right knee arthroscopy, debridement, placement of antibiotic beads (Right Knee)     Patient location during evaluation: PACU Anesthesia Type: General Level of consciousness: awake and alert Pain management: pain level controlled Vital Signs Assessment: post-procedure vital signs reviewed and stable Respiratory status: spontaneous breathing, nonlabored ventilation, respiratory function stable and patient connected to nasal cannula oxygen Cardiovascular status: blood pressure returned to baseline and stable Postop Assessment: no apparent nausea or vomiting Anesthetic complications: no   No complications documented.  Last Vitals:  Vitals:   04/10/21 2021 04/11/21 0749  BP: 113/84 (!) 124/98  Pulse: 66 63  Resp: 17 17  Temp: 36.6 C 36.7 C  SpO2: 95% 95%    Last Pain:  Vitals:   04/11/21 1013  TempSrc:   PainSc: 6                  Karyl Sharrar S

## 2021-04-11 NOTE — Progress Notes (Signed)
  Subjective: Patient is a 70 year old female who presents s/p right knee arthroscopic irrigation and debridement with placement of antibiotic beads (vancomycin and gentamicin).  She complains of increased pain today but denies any constitutional symptoms such as fevers, chills, night sweats, dizziness, lightheadedness.  She just complains of pain in the right knee.  No calf pain.  Has not ambulated yet.   Objective: Vital signs in last 24 hours: Temp:  [97.9 F (36.6 C)-98.3 F (36.8 C)] 98.1 F (36.7 C) (04/13 0749) Pulse Rate:  [57-70] 63 (04/13 0749) Resp:  [10-22] 17 (04/13 0749) BP: (107-156)/(75-122) 124/98 (04/13 0749) SpO2:  [92 %-98 %] 95 % (04/13 0749) Weight:  [68.5 kg] 68.5 kg (04/12 1216)  Intake/Output from previous day: 04/12 0701 - 04/13 0700 In: 985.8 [I.V.:785.8; IV Piggyback:200] Out: 205 [Urine:200; Blood:5] Intake/Output this shift: No intake/output data recorded.  Exam:  Hemovac drain in place with significant bloody drainage around the suprapatellar portal dressing.  This dressing was removed and the Hemovac drain was removed as well.  Portal was redressed.  No calf tenderness.  Negative Homans' sign.  Able to dorsiflex and plantarflex the right ankle.  Able to perform straight leg raise.  2+ DP pulse.  Dressings dry and intact after dressing change.  Labs: Recent Labs    04/11/21 0218  HGB 12.3   Recent Labs    04/11/21 0218  WBC 4.7  RBC 4.09  HCT 36.5  PLT 352   Recent Labs    04/10/21 1306  NA 142  K 2.8*  CL 108  CO2 24  BUN 11  CREATININE 0.61  GLUCOSE 102*  CALCIUM 8.9   No results for input(s): LABPT, INR in the last 72 hours.  Assessment/Plan: Patient is a 70 year old female who presents POD1 s/p right knee arthroscopic irrigation and debridement.  She is doing okay overall but complains of moderate pain.  She has not ambulated yet.  She will be seen by infectious disease today for evaluation on next steps regarding her right  knee infection.  She does have a history of prior right knee infection that was culture negative.  Multiple cultures were taken from the operating room with no administration of antibiotics beforehand.  Recommended patient consider PICC line if this is recommended by ID as this would give her the best chance of resolving this infection once and for all which would be necessary if she is considering total knee arthroplasty in the future given the increased risk for PJ I.  After discussion, patient agrees and is open to PICC line placement.    Okay for weightbearing as tolerated in knee immobilizer.  Remain knee immobilizer throughout today to protect incisions.   Hulan Szumski L Chesnie Capell 04/11/2021, 9:04 AM

## 2021-04-11 NOTE — Plan of Care (Signed)
  Problem: Education: Goal: Knowledge of General Education information will improve Description: Including pain rating scale, medication(s)/side effects and non-pharmacologic comfort measures Outcome: Progressing   Problem: Health Behavior/Discharge Planning: Goal: Ability to manage health-related needs will improve Outcome: Progressing   Problem: Clinical Measurements: Goal: Ability to maintain clinical measurements within normal limits will improve Outcome: Progressing   Problem: Activity: Goal: Risk for activity intolerance will decrease Outcome: Progressing   Problem: Nutrition: Goal: Adequate nutrition will be maintained Outcome: Progressing   Problem: Coping: Goal: Level of anxiety will decrease Outcome: Progressing   Problem: Elimination: Goal: Will not experience complications related to urinary retention Outcome: Progressing   Problem: Pain Managment: Goal: General experience of comfort will improve Outcome: Progressing   

## 2021-04-11 NOTE — Consult Note (Signed)
[facility]  Total days of antibiotics 2               Reason for Consult: septic arthritis of right knee    Referring Physician: dean  Active Problems:   Septic arthritis of knee, right (HCC)    HPI: Becky Murphy is a 70 y.o. female with history of right knee culture negative septic arthritis in April of 2021, treated with 4 wk of iv vancomycin. She states that after finishing abtx last year, her right knee did not return back to normal. In December, she started to notice that her right knee had increase, swelling and pain. It continued throughout the next 3 months. At the end of March, she underwent arthrocentesis that was not conclusive with infection with the plan to repeat in a week to evaluate for any changes. She underwent repeat arthrocentesis and cell count increased up to 45K, with elevated sed rate and CRP of 100 (up from 29 the week prior). Crystal negative. Patient denies overt fever, but does recall having chills, feeling cold more so than her baseline. No recent dental work. Her serum wbc is 4.7 with platelet of 352. Her labs also revealed hypokalemia of 2.8 but Cr stable at her baseline. On 4/12, she underwent right knee arhtroscopy with extensive debridement and synovectomy with placement of vanco/gent, and stimulan beads. ID asked to weigh in on management  She reports that she had difficulty with picc line placement on her right arm last year.  Past Medical History:  Diagnosis Date  . Anemia    low iron in the past  . Anxiety    pt denies  . Arthritis    "back?" (11/03/2015)  . Chest pain 11/02/2015  . Depression    pt denies  . Exertional dyspnea 01/05/2016  . GERD (gastroesophageal reflux disease)   . Heart murmur   . History of peptic ulcer "late 1970's"  . Migraine    "maybe 3-4/yr now" (11/03/2015)  . 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 Mitral valve prolapse 12/26/2015   symptomatic"MVP surgery postponed to get  lesion of pancreas evaluated first".  . Pancreatic mass 01/29/2016   2.8 cm enhancing mass in the pancreatic neck and 3.1 cm low-attenuation cystic lesion in body of pancreas noted on CT angiogram  . PONV (postoperative nausea and vomiting)   . S/P minimally invasive mitral valve repair 03/06/2016   Complex valvuloplasty including triangular resection of posterior leaflet, artificial Gore-tex neochord placement x6 and 34 mm Memo 3D ring annuloplasty via right mini thoracotomy approach with clipping of LA appendage  . Thyroid nodule 01/29/2016   2.4 cm enhancing mixed cystic and solid nodule inferior left thyroid gland noted on CT angiogram    Allergies: No Known Allergies   MEDICATIONS: . aspirin EC  81 mg Oral Daily  . docusate sodium  100 mg Oral BID  . gabapentin  600 mg Oral QHS  . naproxen  250 mg Oral BID WC    Social History   Tobacco Use  . Smoking status: Never Smoker  . Smokeless tobacco: Never Used  Vaping Use  . Vaping Use: Never used  Substance Use Topics  . Alcohol use: Yes    Alcohol/week: 10.0 standard drinks    Types: 6 Glasses of wine, 4 Shots of liquor per week    Comment: occasional  . Drug use: No    Family History  Problem Relation Age of Onset  .  Hypertension Mother   . Cancer Mother [phone]       MELANOMA  . Cancer Father        PROSTATE  . Cancer Sister [phone]       BREAST    Review of Systems  Constitutional: Negative for fever, chills, diaphoresis, activity change, appetite change, fatigue and unexpected weight change.  HENT: Negative for congestion, sore throat, rhinorrhea, sneezing, trouble swallowing and sinus pressure.  Eyes: Negative for photophobia and visual disturbance.  Respiratory: Negative for cough, chest tightness, shortness of breath, wheezing and stridor.  Cardiovascular: Negative for chest pain, palpitations and leg swelling.  Gastrointestinal: Negative for nausea, vomiting, abdominal pain, diarrhea, constipation, blood in stool,  abdominal distention and anal bleeding.  Genitourinary: Negative for dysuria, hematuria, flank pain and difficulty urinating.  Musculoskeletal:+right knee pain. Negative for myalgias, back pain, joint swelling, arthralgias and gait problem.  Skin: Negative for color change, pallor, rash and wound.  Neurological: Negative for dizziness, tremors, weakness and light-headedness.  Hematological: Negative for adenopathy. Does not bruise/bleed easily.  Psychiatric/Behavioral: Negative for behavioral problems, confusion, sleep disturbance, dysphoric mood, decreased concentration and agitation.      OBJECTIVE: Temp:  [97.9 F (36.6 C)-98.3 F (36.8 C)] 98.1 F (36.7 C) (04/13 0749) Pulse Rate:  [57-70] 63 (04/13 0749) Resp:  [10-22] 17 (04/13 0749) BP: (107-156)/(75-122) 124/98 (04/13 0749) SpO2:  [92 %-98 %] 95 % (04/13 0749) Weight:  [68.5 kg] 68.5 kg (04/12 1216)  Constitutional:  oriented to person, place, and time. appears well-developed and well-nourished. No distress.  HENT: [place]/AT, PERRLA, no scleral icterus Mouth/Throat: Oropharynx is clear and moist. No oropharyngeal exudate.  Cardiovascular: Normal rate, regular rhythm and normal heart sounds. Exam reveals no gallop and no friction rub.  No murmur heard.  Pulmonary/Chest: Effort normal and breath sounds normal. No respiratory distress.  has no wheezes.  Neck = supple, no nuchal rigidity Abdominal: Soft. Bowel sounds are normal.  exhibits no distension. There is no tenderness.  Ext: right knee bandaged Lymphadenopathy: no cervical adenopathy. No axillary adenopathy Neurological: alert and oriented to person, place, and time.  Skin: Skin is warm and dry. No rash noted. No erythema.  Psychiatric: a normal mood and affect.  behavior is normal.    LABS: Results for orders placed or performed during the hospital encounter of 04/10/21 (from the past 48 hour(s))  SARS Coronavirus 2 by RT PCR (hospital order, performed in [facility]  hospital lab) Nasopharyngeal Nasopharyngeal Swab     Status: None   Collection Time: 04/10/21 11:46 AM   Specimen: Nasopharyngeal Swab  Result Value Ref Range   SARS Coronavirus 2 NEGATIVE NEGATIVE    Comment: (NOTE) SARS-CoV-2 target nucleic acids are NOT DETECTED.  The SARS-CoV-2 RNA is generally detectable in upper and lower respiratory specimens during the acute phase of infection. The lowest concentration of SARS-CoV-2 viral copies this assay can detect is 250 copies / mL. A negative result does not preclude SARS-CoV-2 infection and should not be used as the sole basis for treatment or other patient management decisions.  A negative result may occur with improper specimen collection / handling, submission of specimen other than nasopharyngeal swab, presence of viral mutation(s) within the areas targeted by this assay, and inadequate number of viral copies (<250 copies / mL). A negative result must be combined with clinical observations, patient history, and epidemiological information.  Fact Sheet for Patients:   BoilerBrush.com.cyhttps://www.fda.gov/media/136312/download  Fact Sheet for Healthcare Providers: https://pope.com/https://www.fda.gov/media/136313/download  This test is not yet approved  or  cleared by the [place] and has been authorized for detection and/or diagnosis of SARS-CoV-2 by FDA under an Emergency Use Authorization (EUA).  This EUA will remain in effect (meaning this test can be used) for the duration of the COVID-19 declaration under Section 564(b)(1) of the Act, 21 U.S.C. section 360bbb-3(b)(1), unless the authorization is terminated or revoked sooner.  Performed at [facility], [address], [place], [place] [address]   Basic metabolic panel     Status: Abnormal   Collection Time: 04/10/21  1:06 PM  Result Value Ref Range   Sodium 142 135 - 145 mmol/L   Potassium 2.8 (L) 3.5 - 5.1 mmol/L   Chloride 108 98 - 111 mmol/L   CO2 24 22 - 32 mmol/L   Glucose, Bld 102  (H) 70 - 99 mg/dL    Comment: Glucose reference range applies only to samples taken after fasting for at least 8 hours.   BUN 11 8 - 23 mg/dL   Creatinine, Ser 6.04 0.44 - 1.00 mg/dL   Calcium 8.9 8.9 - 54.0 mg/dL   GFR, Estimated >98 >11 mL/min    Comment: (NOTE) Calculated using the CKD-EPI Creatinine Equation (2021)    Anion gap 10 5 - 15    Comment: Performed at [facility], [address], [place], [place] [address]  Aerobic/Anaerobic Culture w Gram Stain (surgical/deep wound)     Status: None (Preliminary result)   Collection Time: 04/10/21  4:46 PM   Specimen: Synovial, Right Knee; Body Fluid  Result Value Ref Range   Specimen Description FLUID RIGHT KNEE A    Special Requests FLUID RIGHT KNEE A    Gram Stain      MODERATE WBC PRESENT,BOTH PMN AND MONONUCLEAR NO ORGANISMS SEEN Performed at [facility], [address] [place], [place] [address]    Culture PENDING    Report Status PENDING   Aerobic/Anaerobic Culture w Gram Stain (surgical/deep wound)     Status: None (Preliminary result)   Collection Time: 04/10/21  4:47 PM   Specimen: Synovial, Right Knee; Body Fluid  Result Value Ref Range   Specimen Description FLUID RIGHT KNEE B    Special Requests FLUID RIGHT KNEE B    Gram Stain      MODERATE WBC PRESENT, PREDOMINANTLY PMN NO ORGANISMS SEEN Performed at [facility], [address], [place], [place] [address]    Culture PENDING    Report Status PENDING   Aerobic/Anaerobic Culture w Gram Stain (surgical/deep wound)     Status: None (Preliminary result)   Collection Time: 04/10/21  4:48 PM   Specimen: Synovial, Right Knee; Body Fluid  Result Value Ref Range   Specimen Description FLUID RIGHT KNEE C    Special Requests FLUID RIGHT KNEE C    Gram Stain      MODERATE WBC PRESENT, PREDOMINANTLY PMN NO ORGANISMS SEEN Performed at [facility], [address], [place], [place] [address]    Culture PENDING    Report Status PENDING   CBC      Status: None   Collection Time: 04/11/21  2:18 AM  Result Value Ref Range   WBC 4.7 4.0 - 10.5 K/uL   RBC 4.09 3.87 - 5.11 MIL/uL   Hemoglobin 12.3 12.0 - 15.0 g/dL   HCT 96.2 95.2 - 84.1 %   MCV 89.2 80.0 - 100.0 fL   MCH 30.1 26.0 - 34.0 pg   MCHC  33.7 30.0 - 36.0 g/dL   RDW 84.1 66.0 - 63.0 %   Platelets 352 150 - 400 K/uL   nRBC 0.0 0.0 - 0.2 %    Comment: Performed at [facility], [address], [place], [place] [address]    MICRO: pending IMAGING: [place] EKG SITE RITE  Result Date: 04/11/2021 If Site Rite image not attached, placement could not be confirmed due to current cardiac rhythm.   HISTORICAL MICRO/IMAGING  Assessment/Plan:  70yo F with right knee septic arthritis  - will empirically start vancomycin and ceftriaxone - will check with insurance if daptomycin is covered for gram positive coverage (Easier to administer than vancomycin) - will arrange for picc line to be placed in left arm today - will check sed rate and crp - plan for 6 wk of treatment - follow cx from 4/12 to see if can narrow abtx regimen  Therapeutic drug monitoring = will check vanco levels after 3rd dose  Hypokalemia = will give X 2 today and repeat BMP and magnesium this evening labs

## 2021-04-11 NOTE — Progress Notes (Signed)
Pharmacy Antibiotic Note  Becky Murphy is a 70 y.o. female admitted on 04/10/2021 with septic arthritis of the right knee.  Pharmacy has been consulted for vancomycin dosing.  Pt is afebrile without leukocytosis. Kidney function is stable at baseline. Pt is s/p R knee debridement and placement of antibiotic beads. Plan is for 6 weeks of therapy. Estimated AUC with dosing below is 536.   Plan: Give vancomycin 1,500mg  IV x1 then continue every 24 hours  Follow up with cultures, vanc levels when appropriate Monitor renal function and clinical progress  Height: 5\' 6"  (167.6 cm) Weight: 68.5 kg (151 lb) IBW/kg (Calculated) : 59.3  Temp (24hrs), Avg:98.1 F (36.7 C), Min:97.9 F (36.6 C), Max:98.3 F (36.8 C)  Recent Labs  Lab 04/10/21 1306 04/11/21 0218  WBC  --  4.7  CREATININE 0.61  --     Estimated Creatinine Clearance: 63 mL/min (by C-G formula based on SCr of 0.61 mg/dL).    No Known Allergies  Antimicrobials this admission: Vancomycin 4/13 >> Ceftriaxone 4/13>>  Microbiology results: 4/12 knee culture - ng x24h  Thank you for allowing pharmacy to be a part of this patient's care.  6/12, PharmD PGY1 Acute Care Pharmacy Resident

## 2021-04-11 NOTE — Op Note (Signed)
Becky Murphy, [name]: [id number] ACCOUNT NO: [id number] DATE OF BIRTH: 06/26/1951 FACILITY: [facility] LOCATION: [facility] PHYSICIAN: Graylin Shiver. August Saucer, MD  Operative Report   DATE OF PROCEDURE: 04/10/2021  PREOPERATIVE DIAGNOSIS:  Native joint, right knee infection.  POSTOPERATIVE DIAGNOSIS:  Native joint, right knee infection.  PROCEDURE:  Right knee arthroscopy with extensive debridement, synovectomy with placement of vancomycin/gentamicin, Stimulan beads.  SURGEON:  [name], M.D.  ASSISTANT:  Karenann Cai, [place].  INDICATIONS:  The patient is a 71 year old patient with right knee worsening pain, increasing white count in her right knee to 45,000 and increasing laboratory values of infection including CRP, which increased from 29 to over 100.  She presents now for  operative management of likely native joint infection.  DESCRIPTION OF PROCEDURE:  The patient was brought to the operating room where preoperative IV antibiotics were held until cultures were obtained.  Right leg was pre-scrubbed with alcohol, Betadine, allowed to air dry, prepped with DuraPrep solution and  draped in a sterile manner.  The patient had good range of motion, but moderate to large effusion.  Timeout was called.  A superolateral portal was established and fluid from that portal was evacuated and 3 sets of cultures were obtained.  The cultures  were sent for Gram stain, cell count, aerobic and anaerobic culture along with fungal and AFB cultures.  Following this, anterior inferolateral and anterior inferomedial portals were established.  Diagnostic arthroscopy was performed.  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  No loose bodies.  Significant synovitis was present with similar type film on the joint surface that was present over a year ago.  This was debrided with a shaver.  Gross contamination was removed with the  shaver and 12 liters of irrigating solution.  ACL and PCL were  intact.  Menisci were intact, but again severe tricompartmental degenerative changes were present.  No exposed bone was present.  No areas of softening consistent with obvious osteomyelitis  visualized.  Following thorough and extensive debridement with the shaver as well as irrigation with 12 liters of irrigating solution, Stimulan beads were placed into the suprapatellar pouch.  These were placed through a cannula in the superolateral  portal.  Next Hemovac drain was placed through the same cannula and then a superolateral portal was closed tightly using 3-0 nylon suture.  At this time, the portals were closed using 2-0 Vicryl and 3-0 Nylon.  Solution of Marcaine, morphine, clonidine  then injected into the knee for postoperative pain relief.  Suction was clamped on the Hemovac.  At this time, an impervious dressing was placed.  An Ace wrap applied.  Knee immobilizer applied.  The patient will be allowed to be weightbearing as  tolerated, but I want to keep the leg straight just to allow early healing of the portals.  Drain to be discontinued tomorrow.  Infectious Disease consult obtained.   SUJ D: 04/10/2021 5:46:31 pm T: 04/11/2021 1:55:00 am  JOB: [id number]/ [id number]

## 2021-04-11 NOTE — Progress Notes (Signed)
PHARMACY CONSULT NOTE FOR:  OUTPATIENT  PARENTERAL ANTIBIOTIC THERAPY (OPAT)  Indication: Septic arthritis Regimen: Vancomycin 1,500mg  IV every 24 hours and ceftriaxone 2g IV every 24 hours End date: 05/23/21  IV antibiotic discharge orders are pended. To discharging provider:  please sign these orders via discharge navigator,  Select New Orders & click on the button choice - Manage This Unsigned Work.    Thank you for allowing pharmacy to be a part of this patient's care.  Yvetta Coder, PharmD PGY1 Acute Care Pharmacy Resident

## 2021-04-12 DIAGNOSIS — M00861 Arthritis due to other bacteria, right knee: Secondary | ICD-10-CM | POA: Diagnosis not present

## 2021-04-12 DIAGNOSIS — M009 Pyogenic arthritis, unspecified: Secondary | ICD-10-CM | POA: Diagnosis not present

## 2021-04-12 LAB — BASIC METABOLIC PANEL
Anion gap: 6 (ref 5–15)
BUN: 16 mg/dL (ref 8–23)
CO2: 26 mmol/L (ref 22–32)
Calcium: 8.6 mg/dL — ABNORMAL LOW (ref 8.9–10.3)
Chloride: 108 mmol/L (ref 98–111)
Creatinine, Ser: 0.62 mg/dL (ref 0.44–1.00)
GFR, Estimated: >60 mL/min (ref >60)
Glucose, Bld: 96 mg/dL (ref 70–99)
Potassium: 3.8 mmol/L (ref 3.5–5.1)
Sodium: 140 mmol/L (ref 135–145)

## 2021-04-12 LAB — SEDIMENTATION RATE: Sed Rate: 53 mm/h — ABNORMAL HIGH (ref 0–30)

## 2021-04-12 LAB — CBC WITH DIFFERENTIAL/PLATELET
Absolute Monocytes: 801 cells/uL (ref 200–950)
Basophils Absolute: 70 cells/uL (ref 0–200)
Basophils Relative: 0.8 %
Eosinophils Absolute: 123 cells/uL (ref 15–500)
Eosinophils Relative: 1.4 %
HCT: 41.7 % (ref 35.0–45.0)
Hemoglobin: 14.1 g/dL (ref 11.7–15.5)
Lymphs Abs: 1417 cells/uL (ref 850–3900)
MCH: 30.2 pg (ref 27.0–33.0)
MCHC: 33.8 g/dL (ref 32.0–36.0)
MCV: 89.3 fL (ref 80.0–100.0)
MPV: 10 fL (ref 7.5–12.5)
Monocytes Relative: 9.1 %
Neutro Abs: 6389 cells/uL (ref 1500–7800)
Neutrophils Relative %: 72.6 %
Platelets: 388 10*3/uL (ref 140–400)
RBC: 4.67 10*6/uL (ref 3.80–5.10)
RDW: 13.2 % (ref 11.0–15.0)
Total Lymphocyte: 16.1 %
WBC: 8.8 10*3/uL (ref 3.8–10.8)

## 2021-04-12 LAB — C-REACTIVE PROTEIN: CRP: 129.8 mg/L — ABNORMAL HIGH (ref <8.0)

## 2021-04-12 LAB — ANAEROBIC AND AEROBIC CULTURE
AER RESULT:: NO GROWTH
MICRO NUMBER:: 11748544
MICRO NUMBER:: 11748545
SPECIMEN QUALITY:: ADEQUATE
SPECIMEN QUALITY:: ADEQUATE

## 2021-04-12 LAB — CBC
HCT: 35.3 % — ABNORMAL LOW (ref 36.0–46.0)
Hemoglobin: 11.7 g/dL — ABNORMAL LOW (ref 12.0–15.0)
MCH: 30.2 pg (ref 26.0–34.0)
MCHC: 33.1 g/dL (ref 30.0–36.0)
MCV: 91.2 fL (ref 80.0–100.0)
Platelets: 334 10*3/uL (ref 150–400)
RBC: 3.87 MIL/uL (ref 3.87–5.11)
RDW: 13.3 % (ref 11.5–15.5)
WBC: 7.2 10*3/uL (ref 4.0–10.5)
nRBC: 0 % (ref 0.0–0.2)

## 2021-04-12 LAB — SYNOVIAL FLUID ANALYSIS, COMPLETE
Basophils, %: 0 %
Eosinophils-Synovial: 0 % (ref 0–2)
Lymphocytes-Synovial Fld: 2 % (ref 0–74)
Monocyte/Macrophage: 15 % (ref 0–69)
Neutrophil, Synovial: 83 % — ABNORMAL HIGH (ref 0–24)
Synoviocytes, %: 0 % (ref 0–15)
WBC, Synovial: 45090 cells/uL — ABNORMAL HIGH (ref <150)

## 2021-04-12 LAB — ACID FAST SMEAR (AFB, MYCOBACTERIA)
Acid Fast Smear: NEGATIVE
Acid Fast Smear: NEGATIVE
Acid Fast Smear: NEGATIVE

## 2021-04-12 MED ORDER — ASPIRIN 81 MG PO TBEC: 81.0000 mg | DELAYED_RELEASE_TABLET | Freq: Every day | ORAL | 0 refills | Status: DC

## 2021-04-12 MED ORDER — VANCOMYCIN IV (FOR PTA / DISCHARGE USE ONLY): 1500.0000 mg | INTRAVENOUS | 0 refills | Status: DC

## 2021-04-12 MED ORDER — CEFTRIAXONE IV (FOR PTA / DISCHARGE USE ONLY): 2.0000 g | INTRAVENOUS | 0 refills | Status: DC

## 2021-04-12 MED ORDER — HEPARIN SOD (PORK) LOCK FLUSH 100 UNIT/ML IV SOLN
250.0000 [IU] | INTRAVENOUS | Status: AC | PRN
  Administered 2021-04-12: 250 [IU]
  Filled 2021-04-12: qty 2.5

## 2021-04-12 MED ORDER — METHOCARBAMOL 500 MG PO TABS: 500.0000 mg | ORAL_TABLET | Freq: Three times a day (TID) | ORAL | 0 refills | Status: DC | PRN

## 2021-04-12 MED ORDER — OXYCODONE HCL 5 MG PO TABS: 5.0000 mg | ORAL_TABLET | ORAL | 0 refills | Status: DC | PRN

## 2021-04-12 MED ORDER — VANCOMYCIN IV (FOR PTA / DISCHARGE USE ONLY): 1250.0000 mg | INTRAVENOUS | 0 refills | Status: DC

## 2021-04-12 NOTE — Progress Notes (Signed)
PHARMACY CONSULT NOTE FOR:  OUTPATIENT  PARENTERAL ANTIBIOTIC THERAPY (OPAT)  Indication: Septic arthritis Regimen: Vancomycin 1,250mg  IV every 24 hours (decreased from 1500 mg to prevent nephrotoxicity in case of accumulation given discharging just after starting dose and cannot obtain steady state levels in time. Estimated AUC 447 on 1250mg  Q24h, goal 400-600) and ceftriaxone 2g IV every 24 hours End date: 05/23/21   IV antibiotic discharge orders are pended. To discharging provider:  please sign these orders via discharge navigator,  Select New Orders & click on the button choice - Manage This Unsigned Work.      05/25/21, PharmD, BCPS, BCCP Clinical Pharmacist  Please check AMION for all [facility] Pharmacy phone numbers After 10:00 PM, call Main Pharmacy [phone]

## 2021-04-12 NOTE — Progress Notes (Signed)
  Subjective: Patient is a 70 y.o. Female who is POD2 s/p right knee arthroscopic I&D.  Doing well, denies any fevers, chills, night sweats.  Pain improved compared with yesterday.  She has PICC line in place in left arm.     Objective: Vital signs in last 24 hours: Temp:  [98 F (36.7 C)-99.1 F (37.3 C)] 98.9 F (37.2 C) (04/14 0856) Pulse Rate:  [65-80] 76 (04/14 0856) Resp:  [15-18] 18 (04/14 0856) BP: (102-110)/(78-81) 104/81 (04/14 0856) SpO2:  [95 %-97 %] 97 % (04/14 0856)  Intake/Output from previous day: 04/13 0701 - 04/14 0700 In: 880 [P.O.:480; IV Piggyback:400] Out: -  Intake/Output this shift: No intake/output data recorded.  Exam:  Dressings dry and intact.  Warmth throughout right knee.  Effusion present.  No calf tenderness.  Negative homans sign. Intact DF/PF of right ankle.  Labs: Recent Labs    04/11/21 0218 04/12/21 0322  HGB 12.3 11.7*   Recent Labs    04/11/21 0218 04/12/21 0322  WBC 4.7 7.2  RBC 4.09 3.87  HCT 36.5 35.3*  PLT 352 334   Recent Labs    04/11/21 2255 04/12/21 0836  NA 140 140  K 4.0 3.8  CL 110 108  CO2 26 26  BUN 20 16  CREATININE 0.73 0.62  GLUCOSE 120* 96  CALCIUM 8.2* 8.6*   No results for input(s): LABPT, INR in the last 72 hours.  Assessment/Plan: Pt has received PICC line and has order for IV abx ready for discharge. Plan for discharge home and okay for WBAT with walker for balance.  Recommend she continue to mobilize with knee immobilizer as her quad function recovers.  Follow-up with Dr. August Saucer next week for re-evaluation and likely suture removal. Plan to continue following up on culture results; no growth at this time.  CRP trending down.     Kristle Wesch L Analyah Mcconnon 04/12/2021, 12:25 PM

## 2021-04-12 NOTE — TOC Transition Note (Signed)
Transition of Care [facility]) - CM/SW Discharge Note   Patient Details  Name: Becky Murphy MRN: [id number] Date of Birth: Apr 27, 1951  Transition of Care [facility] CM/SW Contact:  Epifanio Lesches, RN Phone Number: 04/12/2021, 1:24 PM   Clinical Narrative:     Patient will DC to: home Anticipated DC date: 04/12/2021 Family notified: yes Transport by: car  Per MD patient ready for DC today. RN, patient, patient's family and home health agencies notified of DC. Pam with [facility] [phone]) is to teach pt and husband @ bedside prior to d/c how to administer IV ABX therapy.  Pt states son to provide transportation to home. Pt without Rx med concerns.  Post hospital f/u scheduled and noted on AVS.  RNCM will sign off for now as intervention is no longer needed. Please consult [place] again if new needs arise.   Final next level of care: Home w Home Health Services Barriers to Discharge: No Barriers Identified   Patient Goals and CMS Choice     Choice offered to / list presented to : Patient  Discharge Placement                       Discharge Plan and Services   Discharge Planning Services: CM Consult            DME Arranged: Other see comment ([facility] / IV ABX therapy) DME Agency: [employer] Date DME Agency Contacted: 04/11/21 Time DME Agency Contacted: 1220 Representative spoke with at DME Agency: Pam HH Arranged: RN,PT,OT HH Agency: [facility] Date [facility] Agency Contacted: 04/11/21 Time HH Agency Contacted: 1220 Representative spoke with at [facility] Agency: Kandee Keen  Social Determinants of Health (SDOH) Interventions     Readmission Risk Interventions No flowsheet data found.

## 2021-04-12 NOTE — Progress Notes (Signed)
[facility] for Infectious Disease  Date of Admission:  04/10/2021     Total days of antibiotics 3         ASSESSMENT:  Becky Murphy is POD #2 s/p right knee arthroscopic I&D with for recurrent septic arthritis. PICC line placed in left upper arm. Surgical specimens with no organisms on gram stain and no growth on cultures. Recommend to treat for culture negative septic arthritis with Vancomycin and ceftriaxone as unfortunately daptomycin is cost prohibitive. Will plan for 4 weeks of antibiotic treatment with follow up in the ID office. OPAT orders have been placed with Home Health orders below.   PLAN:  1. Continue vancomycin and ceftriaxone through 05/23/21.  2. Wound care per orthopedics. 3. OPAT and Home Health orders. 4. Follow up in ID office in 2 weeks.   Diagnosis: Right Knee Septic arthritis   Culture Result: Culture negative  No Known Allergies  OPAT Orders Discharge antibiotics to be given via PICC line Discharge antibiotics: Vancomycin and ceftriaxone Per pharmacy protocol  Aim for Vancomycin trough 15-20 or AUC 400-550 (unless otherwise indicated) Duration: 4 weeks  End Date: 05/23/21  [facility] Care Per Protocol:  Home health RN for IV administration and teaching; PICC line care and labs.    Labs weekly while on IV antibiotics: _X_ CBC with differential _X_ BMP TWICE WEEKLY __ CMP _X_ CRP _X_ ESR _X_ Vancomycin trough __ CK  __ Please pull PIC at completion of IV antibiotics _X_ Please leave PIC in place until doctor has seen patient or been notified  Fax weekly labs to [phone]  Clinic Follow Up Appt:  4/28 at 9:30am with Dr. Megan Salon   Active Problems:   Septic arthritis of knee, right ([place])   Septic arthritis of knee ([place])   . aspirin EC  81 mg Oral Daily  . Chlorhexidine Gluconate Cloth  6 each Topical Daily  . docusate sodium  100 mg Oral BID  . gabapentin  600 mg Oral QHS  . naproxen  250 mg Oral BID WC     SUBJECTIVE:  Afebrile overnight with no acute events. PICC line placed in left arm. No new concern/complaints.   No Known Allergies   Review of Systems: Review of Systems  Constitutional: Negative for chills, fever and weight loss.  Respiratory: Negative for cough, shortness of breath and wheezing.   Cardiovascular: Negative for chest pain and leg swelling.  Gastrointestinal: Negative for abdominal pain, constipation, diarrhea, nausea and vomiting.  Skin: Negative for rash.      OBJECTIVE: Vitals:   04/11/21 1459 04/11/21 2010 04/12/21 0434 04/12/21 0856  BP: 106/79 102/79 110/78 104/81  Pulse: 65 80 70 76  Resp: '15 16 17 18  ' Temp: 98 F (36.7 C) 99.1 F (37.3 C) 98.6 F (37 C) 98.9 F (37.2 C)  TempSrc: Oral Oral Oral Oral  SpO2: 96% 95% 96% 97%  Weight:      Height:       Body mass index is 24.37 kg/m.  Physical Exam Constitutional:      General: She is not in acute distress.    Appearance: She is well-developed.  Cardiovascular:     Rate and Rhythm: Normal rate and regular rhythm.     Heart sounds: Normal heart sounds.     Comments: PICC line left upper extremity is clean and dry.  Pulmonary:     Effort: Pulmonary effort is normal.     Breath sounds: Normal breath sounds.  Skin:  General: Skin is warm and dry.  Neurological:     Mental Status: She is alert and oriented to person, place, and time.  Psychiatric:        Behavior: Behavior normal.        Thought Content: Thought content normal.        Judgment: Judgment normal.     Lab Results Lab Results  Component Value Date   WBC 7.2 04/12/2021   HGB 11.7 (L) 04/12/2021   HCT 35.3 (L) 04/12/2021   MCV 91.2 04/12/2021   PLT 334 04/12/2021    Lab Results  Component Value Date   CREATININE 0.62 04/12/2021   BUN 16 04/12/2021   NA 140 04/12/2021   K 3.8 04/12/2021   CL 108 04/12/2021   CO2 26 04/12/2021    Lab Results  Component Value Date   ALT 12 (L) 03/04/2016   AST 19  03/04/2016   ALKPHOS 60 03/04/2016   BILITOT 1.1 03/04/2016     Microbiology: Recent Results (from the past 240 hour(s))  Anaerobic and Aerobic Culture     Status: Abnormal   Collection Time: 04/06/21  8:18 AM   Specimen: Synovial, Right Knee; Synovial Fluid  Result Value Ref Range Status   MICRO NUMBER: [id number]  Final   SPECIMEN QUALITY: Adequate  Final   Source: SYNOVIAL FLUID  Final   STATUS: FINAL  Final   GRAM STAIN: (A)  Final    Many White blood cells seen No white blood cells seen No organisms seen   ANA RESULT: No anaerobes isolated.  Final   MICRO NUMBER: [id number]  Final   SPECIMEN QUALITY: Adequate  Final   SOURCE: SYNOVIAL FLUID  Final   STATUS: FINAL  Final   AER RESULT: No Growth  Final  SARS Coronavirus 2 by RT PCR (hospital order, performed in [facility] hospital lab) Nasopharyngeal Nasopharyngeal Swab     Status: None   Collection Time: 04/10/21 11:46 AM   Specimen: Nasopharyngeal Swab  Result Value Ref Range Status   SARS Coronavirus 2 NEGATIVE NEGATIVE Final    Comment: (NOTE) SARS-CoV-2 target nucleic acids are NOT DETECTED.  The SARS-CoV-2 RNA is generally detectable in upper and lower respiratory specimens during the acute phase of infection. The lowest concentration of SARS-CoV-2 viral copies this assay can detect is 250 copies / mL. A negative result does not preclude SARS-CoV-2 infection and should not be used as the sole basis for treatment or other patient management decisions.  A negative result may occur with improper specimen collection / handling, submission of specimen other than nasopharyngeal swab, presence of viral mutation(s) within the areas targeted by this assay, and inadequate number of viral copies (<250 copies / mL). A negative result must be combined with clinical observations, patient history, and epidemiological information.  Fact Sheet for Patients:   StrictlyIdeas.no  Fact Sheet for Healthcare  Providers: BankingDealers.co.za  This test is not yet approved or  cleared by the [place] FDA and has been authorized for detection and/or diagnosis of SARS-CoV-2 by FDA under an Emergency Use Authorization (EUA).  This EUA will remain in effect (meaning this test can be used) for the duration of the COVID-19 declaration under Section 564(b)(1) of the Act, 21 U.S.C. section 360bbb-3(b)(1), unless the authorization is terminated or revoked sooner.  Performed at [facility], [place] [address], [place], [place] [address]   Fungus Culture With Stain     Status: None (Preliminary result)   Collection Time:  04/10/21  4:46 PM   Specimen: Synovial, Right Knee; Body Fluid  Result Value Ref Range Status   Fungus Stain Final report  Final    Comment: (NOTE) Performed At: [facility] [place], [place] [id number] Rush Farmer MD ZM:[id number]    Fungus (Mycology) Culture PENDING  Incomplete   Fungal Source RIGHT  Final    Comment: KNEE FLUID A Performed at [facility], [place] [address] [place], [place] [address]   Aerobic/Anaerobic Culture w Gram Stain (surgical/deep wound)     Status: None (Preliminary result)   Collection Time: 04/10/21  4:46 PM   Specimen: Synovial, Right Knee; Body Fluid  Result Value Ref Range Status   Specimen Description FLUID RIGHT KNEE A  Final   Special Requests FLUID RIGHT KNEE A  Final   Gram Stain   Final    MODERATE WBC PRESENT,BOTH PMN AND MONONUCLEAR NO ORGANISMS SEEN    Culture   Final    NO GROWTH 2 DAYS NO ANAEROBES ISOLATED; CULTURE IN PROGRESS FOR 5 DAYS Performed at [facility], [place] [address], [place], [place] [address]    Report Status PENDING  Incomplete  Fungus Culture Result     Status: None   Collection Time: 04/10/21  4:46 PM  Result Value Ref Range Status   Result 1 Comment  Final    Comment: (NOTE) KOH/Calcofluor preparation:  no fungus observed. Performed At: [facility] [place], [place] [id number] Rush Farmer MD BS:[id number]   Fungus Culture With Stain     Status: None (Preliminary result)   Collection Time: 04/10/21  4:47 PM   Specimen: Synovial, Right Knee; Body Fluid  Result Value Ref Range Status   Fungus Stain Final report  Final    Comment: (NOTE) Performed At: [facility] [place], [place] [id number] Rush Farmer MD SV:[id number]    Fungus (Mycology) Culture PENDING  Incomplete   Fungal Source FLUID  Final    Comment: RIGHT KNEE B Performed at [facility], [place] [address] [place], [place] [address]   Aerobic/Anaerobic Culture w Gram Stain (surgical/deep wound)     Status: None (Preliminary result)   Collection Time: 04/10/21  4:47 PM   Specimen: Synovial, Right Knee; Body Fluid  Result Value Ref Range Status   Specimen Description FLUID RIGHT KNEE B  Final   Special Requests FLUID RIGHT KNEE B  Final   Gram Stain   Final    MODERATE WBC PRESENT, PREDOMINANTLY PMN NO ORGANISMS SEEN    Culture   Final    NO GROWTH 2 DAYS NO ANAEROBES ISOLATED; CULTURE IN PROGRESS FOR 5 DAYS Performed at [facility] [address] [place], [place] [address]    Report Status PENDING  Incomplete  Fungus Culture Result     Status: None   Collection Time: 04/10/21  4:47 PM  Result Value Ref Range Status   Result 1 Comment  Final    Comment: (NOTE) KOH/Calcofluor preparation:  no fungus observed. Performed At: [facility] [place], [place] [id number] Rush Farmer MD [id number]   Fungus Culture With Stain     Status: None (Preliminary result)   Collection Time: 04/10/21  4:48 PM   Specimen: Synovial, Right Knee; Body Fluid  Result Value Ref Range Status   Fungus Stain Final report  Final    Comment: (NOTE) Performed At: [facility] [address] [place], [place] [id number]  Rush Farmer MD MY:[id number]    Fungus (Mycology)  Culture PENDING  Incomplete   Fungal Source FLUID  Final    Comment: RIGHT KNEE C Performed at [facility], [place] [address] [place], [place] [address]   Aerobic/Anaerobic Culture w Gram Stain (surgical/deep wound)     Status: None (Preliminary result)   Collection Time: 04/10/21  4:48 PM   Specimen: Synovial, Right Knee; Body Fluid  Result Value Ref Range Status   Specimen Description FLUID RIGHT KNEE C  Final   Special Requests FLUID RIGHT KNEE C  Final   Gram Stain   Final    MODERATE WBC PRESENT, PREDOMINANTLY PMN NO ORGANISMS SEEN    Culture   Final    NO GROWTH 2 DAYS NO ANAEROBES ISOLATED; CULTURE IN PROGRESS FOR 5 DAYS Performed at [facility], [address] [place], [place] [address]    Report Status PENDING  Incomplete  Fungus Culture Result     Status: None   Collection Time: 04/10/21  4:48 PM  Result Value Ref Range Status   Result 1 Comment  Final    Comment: (NOTE) KOH/Calcofluor preparation:  no fungus observed. Performed At: [facility] [address] [place], [place] [id number] Rush Farmer MD QA:[id number]      Terri Piedra, [place] for Infectious Disease [facility]  04/12/2021  1:14 PM

## 2021-04-12 NOTE — Progress Notes (Signed)
Pt was given her AVS discharge summary and went over with her. PICC was flushed and capped for home by IV team. Pt had no further questions.

## 2021-04-12 NOTE — Plan of Care (Signed)

## 2021-04-12 NOTE — Progress Notes (Signed)
Physical Therapy Evaluation Patient Details Name: Becky Murphy MRN: [id number] DOB: [birth year] Today's Date: 04/12/2021   History of Present Illness  71 yo female with onset of R knee arthroscopic surgery April 2021 had repeated arthroscopic surgery to do I and D, synovectomy, closed with Stimulan beads and Vancomycin, Gentomycin.  WBAT in knee immobilizer.  PMHx:  anemia, anxiety, chest pain, depression, GERD, SOB with exertion, PUD, migraine, pancreatic mass, mitral regurg, heart murmur, thyroid nodule  Clinical Impression  Pt was seen for mobility on RW with instructions for managing immobilizer on RLE and for safety of transfers.  Following up with home therapy PT and will have her husband to assist her.  Gave pt instructions for home, gait belt and had husband verbalize the instructions for safety with gait.  Follow for goals of PT as follow if DC is not done today.    Follow Up Recommendations Home health PT;Supervision for mobility/OOB    Equipment Recommendations  Rolling walker with 5" wheels    Recommendations for Other Services       Precautions / Restrictions Precautions Precautions: Fall Precaution Comments: WBAT in immobilzer Required Braces or Orthoses: Knee Immobilizer - Right Knee Immobilizer - Right: On at all times Restrictions Weight Bearing Restrictions: Yes RLE Weight Bearing: Weight bearing as tolerated      Mobility  Bed Mobility               General bed mobility comments: in chair when PT arrived    Transfers Overall transfer level: Needs assistance Equipment used: Rolling walker (2 wheeled);1 person hand held assist Transfers: Sit to/from Stand Sit to Stand: Min assist         General transfer comment: cued hand placement  Ambulation/Gait Ambulation/Gait assistance: Min guard;Min assist Gait Distance (Feet): 45 Feet Assistive device: Rolling walker (2 wheeled);1 person hand held assist Gait Pattern/deviations: Step-to  pattern;Step-through pattern;Shuffle;Decreased stride length;Trunk flexed Gait velocity: reduced      Stairs            Wheelchair Mobility    Modified Rankin (Stroke Patients Only)       Balance Overall balance assessment: Needs assistance Sitting-balance support: Feet supported Sitting balance-Leahy Scale: Fair     Standing balance support: Bilateral upper extremity supported Standing balance-Leahy Scale: Poor                               Pertinent Vitals/Pain Pain Assessment: Faces Faces Pain Scale: Hurts little more Pain Location: R knee Pain Descriptors / Indicators: Grimacing;Guarding Pain Intervention(s): Limited activity within patient's tolerance;Premedicated before session;Repositioned;Ice applied    Home Living Family/patient expects to be discharged to:: Private residence Living Arrangements: Spouse/significant other Available Help at Discharge: Family Type of Home: House Home Access: Stairs to enter Entrance Stairs-Rails: Can reach both Entrance Stairs-Number of Steps: 1+1 short steps Home Layout: One level Home Equipment: Cane - single point;Walker - 4 wheels;Walker - 2 wheels;Grab bars - toilet      Prior Function Level of Independence: Independent;Independent with assistive device(s)         Comments: has been struggling with R knee for a year     Hand Dominance   Dominant Hand: Right    Extremity/Trunk Assessment   Upper Extremity Assessment Upper Extremity Assessment: Overall WFL for tasks assessed    Lower Extremity Assessment Lower Extremity Assessment: RLE deficits/detail RLE Deficits / Details: R knee in immobilizer to avoid flexion RLE: Unable  to fully assess due to immobilization RLE Coordination: decreased gross motor    Cervical / Trunk Assessment Cervical / Trunk Assessment: Kyphotic (mild)  Communication   Communication: No difficulties  Cognition Arousal/Alertness: Awake/alert Behavior During  Therapy: WFL for tasks assessed/performed Overall Cognitive Status: Within Functional Limits for tasks assessed                                        General Comments General comments (skin integrity, edema, etc.): pt is up to walk with min guard and discussion about application of immob and safety with transfers.  Pt is with husband to ask questions and to observe safety with gait with transfer belt    Exercises     Assessment/Plan    PT Assessment Patient needs continued PT services  PT Problem List Decreased strength;Decreased range of motion;Decreased activity tolerance;Decreased balance;Decreased mobility;Decreased coordination;Decreased knowledge of use of DME;Decreased safety awareness;Pain;Decreased skin integrity       PT Treatment Interventions DME instruction;Gait training;Stair training;Functional mobility training;Therapeutic activities;Therapeutic exercise;Balance training;Neuromuscular re-education;Patient/family education    PT Goals (Current goals can be found in the Care Plan section)  Acute Rehab PT Goals Patient Stated Goal: to get home with husband and knee to feel better PT Goal Formulation: With patient/family Time For Goal Achievement: 04/26/21 Potential to Achieve Goals: Good    Frequency Min 5X/week   Barriers to discharge Inaccessible home environment;Decreased caregiver support steprs to enter house    Co-evaluation               AM-PAC PT "6 Clicks" Mobility  Outcome Measure Help needed turning from your back to your side while in a flat bed without using bedrails?: A Little Help needed moving from lying on your back to sitting on the side of a flat bed without using bedrails?: A Little Help needed moving to and from a bed to a chair (including a wheelchair)?: A Little Help needed standing up from a chair using your arms (e.g., wheelchair or bedside chair)?: A Little Help needed to walk in hospital room?: A Little Help  needed climbing 3-5 steps with a railing? : A Lot 6 Click Score: 17    End of Session Equipment Utilized During Treatment: Gait belt Activity Tolerance: Patient tolerated treatment well;Patient limited by fatigue Patient left: in chair;with call bell/phone within reach;with chair alarm set;with family/visitor present Nurse Communication: Mobility status PT Visit Diagnosis: Unsteadiness on feet (R26.81);Muscle weakness (generalized) (M62.81);Difficulty in walking, not elsewhere classified (R26.2);Pain Pain - Right/Left: Right Pain - part of body: Knee    Time: 6384-6659 PT Time Calculation (min) (ACUTE ONLY): 34 min   Charges:   PT Evaluation $PT Eval Moderate Complexity: 1 Mod PT Treatments $Gait Training: 8-22 mins       Ivar Drape 04/12/2021, 8:44 PM Samul Dada, PT MS Acute Rehab Dept. Number: [facility] [id number] and [facility] [phone]

## 2021-04-16 ENCOUNTER — Telehealth: Payer: Self-pay

## 2021-04-16 ENCOUNTER — Encounter: Payer: Self-pay | Admitting: Internal Medicine

## 2021-04-16 LAB — AEROBIC/ANAEROBIC CULTURE W GRAM STAIN (SURGICAL/DEEP WOUND)
Culture: NO GROWTH
Culture: NO GROWTH
Culture: NO GROWTH

## 2021-04-16 NOTE — Telephone Encounter (Signed)
1st dose of daptomycin will need to be given at Short Stay as this is her first time getting medication. Patient scheduled for earliest available on 4/26 at 9am. Orders signed by Marcos Eke, NP. Patient notified of appointment and RN will follow up if additional orders are needed. Orders faxed to Short Stay   Rosanna Randy, RN

## 2021-04-16 NOTE — Telephone Encounter (Signed)
Becky Murphy, with [facility] called to report patient has a full body rash. No respiratory distress noted; she is on IV vancomycin and ceftriaxone. Patient did take benadryl last night which has helped alleviate some symptoms but she is still reporting itching. Forwarding to provider.  Natavia Sublette Loyola Mast, RN

## 2021-04-16 NOTE — Telephone Encounter (Signed)
Gave orders to Tim with [facility] to discontinue IV vancomycin and start Daptomycin 6mg /kg per Dr. . Continue Ceftriaxone as previously ordered. Patient's follow up changed to this Thursday.   Spoke with patient and informed her of changes. Patient's husband updated and will discontinue the vancomycin today. Instructed to continue ceftriaxone schedule as he's been doing.   Lizzy Hamre Monday, RN

## 2021-04-16 NOTE — Telephone Encounter (Signed)
Please give orders to change vancomycin to daptomycin and continue ceftriaxone for now.  Also please add her to my schedule this Thursday.  Thanks.

## 2021-04-18 ENCOUNTER — Ambulatory Visit (INDEPENDENT_AMBULATORY_CARE_PROVIDER_SITE_OTHER): Payer: Medicare Other | Admitting: Orthopedic Surgery

## 2021-04-18 DIAGNOSIS — M25461 Effusion, right knee: Secondary | ICD-10-CM

## 2021-04-19 ENCOUNTER — Ambulatory Visit (INDEPENDENT_AMBULATORY_CARE_PROVIDER_SITE_OTHER): Payer: Medicare Other | Admitting: Internal Medicine

## 2021-04-19 ENCOUNTER — Other Ambulatory Visit: Payer: Self-pay

## 2021-04-19 ENCOUNTER — Encounter: Payer: Self-pay | Admitting: Internal Medicine

## 2021-04-19 ENCOUNTER — Telehealth: Payer: Self-pay

## 2021-04-19 DIAGNOSIS — M009 Pyogenic arthritis, unspecified: Secondary | ICD-10-CM | POA: Diagnosis not present

## 2021-04-19 DIAGNOSIS — M659 Synovitis and tenosynovitis, unspecified: Secondary | ICD-10-CM | POA: Diagnosis not present

## 2021-04-19 DIAGNOSIS — R21 Rash and other nonspecific skin eruption: Secondary | ICD-10-CM | POA: Diagnosis not present

## 2021-04-19 DIAGNOSIS — Z5181 Encounter for therapeutic drug level monitoring: Secondary | ICD-10-CM

## 2021-04-19 DIAGNOSIS — T368X5A Adverse effect of other systemic antibiotics, initial encounter: Secondary | ICD-10-CM | POA: Insufficient documentation

## 2021-04-19 DIAGNOSIS — T368X5D Adverse effect of other systemic antibiotics, subsequent encounter: Secondary | ICD-10-CM

## 2021-04-19 DIAGNOSIS — M65961 Unspecified synovitis and tenosynovitis, right lower leg: Secondary | ICD-10-CM

## 2021-04-19 MED ORDER — LINEZOLID 600 MG PO TABS: 600.0000 mg | ORAL_TABLET | Freq: Two times a day (BID) | ORAL | 0 refills | Status: DC

## 2021-04-19 NOTE — Telephone Encounter (Signed)
RN spoke with Eunice Blase at [facility] to relay verbal orders per Dr. Orvan Falconer to discontinue daptomycin and continue with ceftriaxone. Orders repeated and verified.   RN spoke with Laverne in short stay to cancel patient's first dose appointment.   Sandie Ano, RN

## 2021-04-19 NOTE — Progress Notes (Signed)
[facility] for Infectious Disease  Patient Active Problem List   Diagnosis Date Noted  . Vancomycin adverse reaction 04/19/2021    Priority: High  . Therapeutic drug monitoring 04/19/2021    Priority: High  . Septic arthritis of knee, right ([place]) 04/10/2021    Priority: High  . Synovitis of right knee 04/04/2020    Priority: High  . Long term (current) use of anticoagulants [Z79.01] 03/14/2016  . S/P minimally invasive mitral valve repair 03/06/2016  . Pancreatic mass 01/29/2016  . Thyroid nodule 01/29/2016  . Exertional dyspnea 01/05/2016  . 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 Mitral insufficiency   . Mitral valve prolapse 12/26/2015  . Anxiety 11/02/2015    Patient's Medications  New Prescriptions   LINEZOLID (ZYVOX) 600 MG TABLET    Take 1 tablet (600 mg total) by mouth 2 (two) times daily.  Previous Medications   ASPIRIN 81 MG EC TABLET    Take 1 tablet (81 mg total) by mouth daily.   ASPIRIN EC 325 MG TABLET    Take 325 mg by mouth daily as needed (headaches).   CEFTRIAXONE (ROCEPHIN) IVPB    Inject 2 g into the vein daily. Indication:  Septic arthritis  First Dose: Yes Last Day of Therapy:  05/23/2021 Labs - Once weekly:  CBC/D and BMP, Labs - Every other week:  ESR and CRP Method of administration: IV Push Method of administration may be changed at the discretion of home infusion pharmacist based upon assessment of the patient and/or caregiver's ability to self-administer the medication ordered.   CHOLECALCIFEROL (VITAMIN D3) 25 MCG (1000 UNIT) TABLET    Take 1,000 Units by mouth daily.   GABAPENTIN (NEURONTIN) 300 MG CAPSULE    Take 600 mg by mouth at bedtime.   IBUPROFEN (ADVIL) 200 MG TABLET    Take 800 mg by mouth 2 (two) times daily.   MENTHOL, TOPICAL ANALGESIC, ([facility] COLD & HOT MEDICATED EX)    Apply 1 application topically daily as needed (pain).   METHOCARBAMOL (ROBAXIN) 500 MG TABLET    Take 1 tablet (500 mg total) by mouth  every 8 (eight) hours as needed for muscle spasms.   OXYCODONE (OXY IR/ROXICODONE) 5 MG IMMEDIATE RELEASE TABLET    Take 1 tablet (5 mg total) by mouth every 4 (four) hours as needed for moderate pain (pain score 4-6).  Modified Medications   No medications on file  Discontinued Medications   VANCOMYCIN IVPB    Inject 1,250 mg into the vein daily. Indication: Septic arthritis  First Dose: Yes Last Day of Therapy:  05/23/2021 Labs - Sunday/Monday:  CBC/D, BMP, and vancomycin trough. Labs - Thursday:  BMP and vancomycin trough Labs - Every other week:  ESR and CRP Method of administration:Elastomeric Method of administration may be changed at the discretion of the patient and/or caregiver's ability to self-administer the medication ordered.    Subjective: Becky Murphy is in for her hospital follow-up visit.  I treated her 1 year ago for her smoldering culture-negative right knee infection.  She appeared to have been cured only to have right knee pain and swelling began to recur in January of this year.  Her pain became progressively more severe leading her to go back to her orthopedic surgeon, Dr. Alphonzo Severance.  Arthrocentesis at that time showed no organisms on Gram stain and negative cultures.  Repeat arthrocentesis on 04/06/2021 revealed 45,090 white blood cells of which 83% were segmented neutrophils.  No crystals were seen.  She was admitted to the hospital and underwent arthroscopic washout with extensive debridement of the synovium.  Routine fungal and AFB stains are all negative.  Routine cultures were negative.  Fungal and AFB cultures are negative so far.  She was seen by my partner, Dr. Carlyle Basques, who elected to have a PICC placed.  She was discharged on IV vancomycin and ceftriaxone on 04/12/2021.  I received a phone call on 04/16/2021 stating that she had developed a diffuse red pruritic rash.  I elected to discontinue vancomycin but continue ceftriaxone.  Fortunately her rash resolved  promptly.  My initial plan was to substitute daptomycin for vancomycin but it was discovered that it was very expensive and her initial dose could not be arranged until 04/24/2021.  She has had no problems tolerating her PICC or ceftriaxone.  Her knee pain and swelling have improved.  Review of Systems: Review of Systems  Constitutional: Positive for malaise/fatigue. Negative for chills, diaphoresis and fever.  Gastrointestinal: Negative for abdominal pain, diarrhea, nausea and vomiting.  Musculoskeletal: Positive for joint pain.  Skin: Positive for itching and rash.    Past Medical History:  Diagnosis Date  . Anemia    low iron in the past  . Anxiety    pt denies  . Arthritis    "back?" (11/03/2015)  . Chest pain 11/02/2015  . Depression    pt denies  . Exertional dyspnea 01/05/2016  . GERD (gastroesophageal reflux disease)   . Heart murmur   . History of peptic ulcer "late 1970's"  . Migraine    "maybe 3-4/yr now" (11/03/2015)  . 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 Mitral valve prolapse 12/26/2015   symptomatic"MVP surgery postponed to get lesion of pancreas evaluated first".  . Pancreatic mass 01/29/2016   2.8 cm enhancing mass in the pancreatic neck and 3.1 cm low-attenuation cystic lesion in body of pancreas noted on CT angiogram  . PONV (postoperative nausea and vomiting)   . S/P minimally invasive mitral valve repair 03/06/2016   Complex valvuloplasty including triangular resection of posterior leaflet, artificial Gore-tex neochord placement x6 and 34 mm Memo 3D ring annuloplasty via right mini thoracotomy approach with clipping of LA appendage  . Thyroid nodule 01/29/2016   2.4 cm enhancing mixed cystic and solid nodule inferior left thyroid gland noted on CT angiogram    Social History   Tobacco Use  . Smoking status: Never Smoker  . Smokeless tobacco: Never Used  Vaping Use  . Vaping Use: Never used  Substance Use Topics  . Alcohol use: Yes     Alcohol/week: 10.0 standard drinks    Types: 6 Glasses of wine, 4 Shots of liquor per week    Comment: occasional  . Drug use: No    Family History  Problem Relation Age of Onset  . Hypertension Mother   . Cancer Mother 42       MELANOMA  . Cancer Father        PROSTATE  . Cancer Sister 53       BREAST    No Known Allergies  Objective: Vitals:   04/19/21 1042  BP: (!) 155/91  Pulse: 64  Temp: (!) 97.5 F (36.4 C)  TempSrc: Oral  SpO2: 98%  Weight: 161 lb (73 kg)  Height: '5\' 6"'  (1.676 m)   Body mass index is 25.99 kg/m.  Physical Exam Constitutional:      Comments: She is very calm and pleasant as  usual.  She is accompanied by her husband.  Cardiovascular:     Rate and Rhythm: Normal rate and regular rhythm.     Heart sounds: No murmur heard.   Pulmonary:     Effort: Pulmonary effort is normal.     Breath sounds: Normal breath sounds.  Musculoskeletal:     Comments: She has mild diffuse swelling of her right knee.  She still has Steri-Strips on her surgical incision sites.  Skin:    Comments: Right arm PICC site looks good.  She only has faint redness from her recent rash.  Psychiatric:        Mood and Affect: Mood normal.     Lab Results Sed Rate  Date Value  04/11/2021 45 mm/hr (H)  04/06/2021 53 mm/h (H)  03/22/2021 17 mm/h   CRP  Date Value  04/11/2021 9.3 mg/dL (H)  04/06/2021 129.8 mg/L (H)  03/22/2021 29.1 mg/L (H)     Problem List Items Addressed This Visit      High   Synovitis of right knee   Relevant Medications   linezolid (ZYVOX) 600 MG tablet   Septic arthritis of knee, right ([facility])    The cause of her recurrent, culture-negative cellulitis and presumed septic arthritis of her right knee remains unclear.  However, as she did last year, she seems to be responding to arthroscopic washout and standard antibacterial therapy.  She recently developed an adverse, allergic reaction to vancomycin that resolved quickly after stopping it.  I  will start oral linezolid today to cover gram-positive organisms and continue IV ceftriaxone.  She will follow-up in 2 weeks.      Relevant Medications   linezolid (ZYVOX) 600 MG tablet   Vancomycin adverse reaction    It appears that her pruritic red rash was an allergic reaction to vancomycin.  It started resolving promptly after stopping vancomycin.      Therapeutic drug monitoring    I informed her of potential adverse reactions to linezolid including coated tongue, bone marrow suppression and neuropathy.  The risks of these are very low when linezolid is used for less than 1 month.  She will follow-up in 2 weeks for further evaluation.  She will continue to get weekly lab work.          Michel Bickers, MD [facility] for Infectious [facility] [phone] pager   [phone] cell 04/19/2021, 1:36 PM

## 2021-04-19 NOTE — Assessment & Plan Note (Signed)
It appears that her pruritic red rash was an allergic reaction to vancomycin.  It started resolving promptly after stopping vancomycin.

## 2021-04-19 NOTE — Assessment & Plan Note (Signed)
The cause of her recurrent, culture-negative cellulitis and presumed septic arthritis of her right knee remains unclear.  However, as she did last year, she seems to be responding to arthroscopic washout and standard antibacterial therapy.  She recently developed an adverse, allergic reaction to vancomycin that resolved quickly after stopping it.  I will start oral linezolid today to cover gram-positive organisms and continue IV ceftriaxone.  She will follow-up in 2 weeks.

## 2021-04-19 NOTE — Assessment & Plan Note (Signed)
I informed her of potential adverse reactions to linezolid including coated tongue, bone marrow suppression and neuropathy.  The risks of these are very low when linezolid is used for less than 1 month.  She will follow-up in 2 weeks for further evaluation.  She will continue to get weekly lab work.

## 2021-04-20 ENCOUNTER — Telehealth: Payer: Self-pay

## 2021-04-20 ENCOUNTER — Other Ambulatory Visit (HOSPITAL_COMMUNITY): Payer: Self-pay

## 2021-04-20 ENCOUNTER — Encounter: Payer: Medicare Other | Admitting: Orthopedic Surgery

## 2021-04-20 ENCOUNTER — Ambulatory Visit: Payer: Medicare Other | Admitting: Orthopedic Surgery

## 2021-04-20 DIAGNOSIS — M659 Synovitis and tenosynovitis, unspecified: Secondary | ICD-10-CM

## 2021-04-20 MED ORDER — LINEZOLID 600 MG PO TABS: 600.0000 mg | ORAL_TABLET | Freq: Two times a day (BID) | ORAL | 0 refills | Status: DC

## 2021-04-20 NOTE — Telephone Encounter (Signed)
PA for Linezolid has been approved through 05/20/21. Approval has [facility]ed to [facility].  Becky Murphy

## 2021-04-20 NOTE — Telephone Encounter (Signed)
Error. °Linda Grimmer M, RN ° °

## 2021-04-20 NOTE — Addendum Note (Signed)
Addended by: Andree Coss on: 04/20/2021 05:09 PM   Modules accepted: Orders

## 2021-04-20 NOTE — Telephone Encounter (Signed)
[facility] called back after receiving prior authorization. Patient's copay is $2670. RN notified Lupita Leash to see if she is aware of any patient assistance programs. Will follow. Andree Coss, RN

## 2021-04-20 NOTE — Telephone Encounter (Signed)
Initiated PA for Linezolid 600 mg tablet. PA submitted via Covermymeds. Faxed additional information for medical review.  Determination will faxed to office.   please contact [employer] at [phone].

## 2021-04-20 NOTE — Telephone Encounter (Signed)
Becky Murphy procured a coupon card for linezolid 600 mg #60, out of pocket cost would be $137.25 at [facility], [address]  RN notified patient and her husband. They agreed to use this pharmacy. RN called [facility] to make them aware, ask if they accept the coupon card, and if they can provide the linezolid. [facility] will need to do a partial fill today, order the rest.  Andree Coss, RN

## 2021-04-22 ENCOUNTER — Encounter: Payer: Self-pay | Admitting: Orthopedic Surgery

## 2021-04-22 NOTE — Progress Notes (Signed)
Post-Op Visit Note   Patient: Becky Murphy           Date of Birth: 09-17-1951           MRN: [id number] Visit Date: 04/18/2021 PCP: Kaleen Mask, MD   Assessment & Plan:  Chief Complaint:  Chief Complaint  Patient presents with  . Right Knee - Routine Post Op   Visit Diagnoses:  1. Effusion of right knee     Plan: Sadey is a 70 year old patient who underwent right knee arthroscopy debridement and placement of stimulant antibiotic beads a week ago.  She is walking some.  She developed an allergy to vancomycin and is having some other type of antibiotic worked out by infectious disease.  She is doing home health physical therapy.  On exam there is no effusion in the knee and her range of motion is actually good.  No proximal lymphadenopathy.  Sutures are discontinued today.  4-week return for clinical recheck.  If she has any type of recurrence problems then MRI scan to rule out any type of occult infection would be indicated.  Follow-up with Franky Macho in 4 weeks for clinical recheck would not want to do that MRI scan for least 3 to 4 months after the arthroscopic procedure.  Follow-Up Instructions: Return in about 4 weeks (around 05/16/2021).   Orders:  No orders of the defined types were placed in this encounter.  No orders of the defined types were placed in this encounter.   Imaging: No results found.  PMFS History: Patient Active Problem List   Diagnosis Date Noted  . Vancomycin adverse reaction 04/19/2021  . Therapeutic drug monitoring 04/19/2021  . Septic arthritis of knee, right (HCC) 04/10/2021  . Synovitis of right knee 04/04/2020  . Long term (current) use of anticoagulants [Z79.01] 03/14/2016  . S/P minimally invasive mitral valve repair 03/06/2016  . Pancreatic mass 01/29/2016  . Thyroid nodule 01/29/2016  . Exertional dyspnea 01/05/2016  . 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 Mitral insufficiency   . Mitral valve prolapse  12/26/2015  . Anxiety 11/02/2015   Past Medical History:  Diagnosis Date  . Anemia    low iron in the past  . Anxiety    pt denies  . Arthritis    "back?" (11/03/2015)  . Chest pain 11/02/2015  . Depression    pt denies  . Exertional dyspnea 01/05/2016  . GERD (gastroesophageal reflux disease)   . Heart murmur   . History of peptic ulcer "late 1970's"  . Migraine    "maybe 3-4/yr now" (11/03/2015)  . 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 Mitral valve prolapse 12/26/2015   symptomatic"MVP surgery postponed to get lesion of pancreas evaluated first".  . Pancreatic mass 01/29/2016   2.8 cm enhancing mass in the pancreatic neck and 3.1 cm low-attenuation cystic lesion in body of pancreas noted on CT angiogram  . PONV (postoperative nausea and vomiting)   . S/P minimally invasive mitral valve repair 03/06/2016   Complex valvuloplasty including triangular resection of posterior leaflet, artificial Gore-tex neochord placement x6 and 34 mm Memo 3D ring annuloplasty via right mini thoracotomy approach with clipping of LA appendage  . Thyroid nodule 01/29/2016   2.4 cm enhancing mixed cystic and solid nodule inferior left thyroid gland noted on CT angiogram    Family History  Problem Relation Age of Onset  . Hypertension Mother   . Cancer Mother 42       MELANOMA  .  Cancer Father        PROSTATE  . Cancer Sister 32       BREAST    Past Surgical History:  Procedure Laterality Date  . BACK SURGERY    . BREAST BIOPSY Left ~ 2005  . BREAST LUMPECTOMY Left ~ 2005  . CARDIAC CATHETERIZATION N/A 01/16/2016   Procedure: Right/Left Heart Cath and Coronary Angiography;  Surgeon: Thurmon Fair, MD;  Location: [facility];  Service: Cardiovascular;  Laterality: N/A;  . CLIPPING OF ATRIAL APPENDAGE Left 03/06/2016   Procedure: CLIPPING OF LEFT ATRIAL APPENDAGE using a 45 PRO2 AtriClip;  Surgeon: Purcell Nails, MD;  Location: [facility] OR;  Service: Open Heart Surgery;  Laterality:  Left;  . COLONOSCOPY    . EUS N/A 02/20/2016   Procedure: ESOPHAGEAL ENDOSCOPIC ULTRASOUND (EUS) RADIAL;  Surgeon: Willis Modena, MD;  Location: [facility];  Service: Endoscopy;  Laterality: N/A;  . EYE MUSCLE SURGERY Bilateral 1970's?  Marland Kitchen KNEE ARTHROSCOPY Right 04/04/2020   Procedure: RIGHT KNEE ARTHROSCOPY WITH DEBRIDEMENT;  Surgeon: Cammy Copa, MD;  Location: [facility] OR;  Service: Orthopedics;  Laterality: Right;  . KNEE ARTHROSCOPY Right 04/10/2021   Procedure: right knee arthroscopy, debridement, placement of antibiotic beads;  Surgeon: Cammy Copa, MD;  Location: [facility] OR;  Service: Orthopedics;  Laterality: Right;  . LUMBAR DISC SURGERY  1992   "trimmed bulges off both sides"  . MITRAL VALVE REPAIR Right 03/06/2016   Procedure: MINIMALLY INVASIVE MITRAL VALVE REPAIR (MVR) using a 34 Sorin Memo 3D Ring;  Surgeon: Purcell Nails, MD;  Location: [facility] OR;  Service: Open Heart Surgery;  Laterality: Right;  . TEE WITHOUT CARDIOVERSION N/A 01/05/2016   Procedure: TRANSESOPHAGEAL ECHOCARDIOGRAM (TEE);  Surgeon: Thurmon Fair, MD;  Location: [facility] ENDOSCOPY;  Service: Cardiovascular;  Laterality: N/A;  . TEE WITHOUT CARDIOVERSION N/A 03/06/2016   Procedure: TRANSESOPHAGEAL ECHOCARDIOGRAM (TEE);  Surgeon: Purcell Nails, MD;  Location: [facility] OR;  Service: Open Heart Surgery;  Laterality: N/A;  . TUBAL LIGATION     Social History   Occupational History  . Not on file  Tobacco Use  . Smoking status: Never Smoker  . Smokeless tobacco: Never Used  Vaping Use  . Vaping Use: Never used  Substance and Sexual Activity  . Alcohol use: Yes    Alcohol/week: 10.0 standard drinks    Types: 6 Glasses of wine, 4 Shots of liquor per week    Comment: occasional  . Drug use: No  . Sexual activity: Not Currently

## 2021-04-24 ENCOUNTER — Ambulatory Visit (HOSPITAL_COMMUNITY): Payer: Medicare Other

## 2021-04-26 ENCOUNTER — Inpatient Hospital Stay: Payer: Medicare Other | Admitting: Internal Medicine

## 2021-04-30 ENCOUNTER — Encounter: Payer: Self-pay | Admitting: Internal Medicine

## 2021-05-03 ENCOUNTER — Other Ambulatory Visit: Payer: Self-pay

## 2021-05-03 ENCOUNTER — Encounter: Payer: Self-pay | Admitting: Internal Medicine

## 2021-05-03 ENCOUNTER — Telehealth: Payer: Self-pay | Admitting: *Deleted

## 2021-05-03 ENCOUNTER — Ambulatory Visit (INDEPENDENT_AMBULATORY_CARE_PROVIDER_SITE_OTHER): Payer: Medicare Other | Admitting: Internal Medicine

## 2021-05-03 DIAGNOSIS — X501XXA Overexertion from prolonged static or awkward postures, initial encounter: Secondary | ICD-10-CM

## 2021-05-03 DIAGNOSIS — Y93E5 Activity, floor mopping and cleaning: Secondary | ICD-10-CM

## 2021-05-03 DIAGNOSIS — M25561 Pain in right knee: Secondary | ICD-10-CM | POA: Diagnosis not present

## 2021-05-03 DIAGNOSIS — Z5181 Encounter for therapeutic drug level monitoring: Secondary | ICD-10-CM | POA: Diagnosis not present

## 2021-05-03 DIAGNOSIS — T368X5D Adverse effect of other systemic antibiotics, subsequent encounter: Secondary | ICD-10-CM | POA: Diagnosis not present

## 2021-05-03 DIAGNOSIS — Z959 Presence of cardiac and vascular implant and graft, unspecified: Secondary | ICD-10-CM

## 2021-05-03 DIAGNOSIS — M25461 Effusion, right knee: Secondary | ICD-10-CM

## 2021-05-03 DIAGNOSIS — M009 Pyogenic arthritis, unspecified: Secondary | ICD-10-CM | POA: Diagnosis not present

## 2021-05-03 NOTE — Progress Notes (Signed)
[facility]  Patient Active Problem List   Diagnosis Date Noted  . Vancomycin adverse reaction 04/19/2021    Priority: High  . Therapeutic drug monitoring 04/19/2021    Priority: High  . Septic arthritis of knee, right ([place]) 04/10/2021    Priority: High  . Synovitis of right knee 04/04/2020    Priority: High  . Long term (current) use of anticoagulants [Z79.01] 03/14/2016  . S/P minimally invasive mitral valve repair 03/06/2016  . Pancreatic mass 01/29/2016  . Thyroid nodule 01/29/2016  . Exertional dyspnea 01/05/2016  . 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 Mitral insufficiency   . Mitral valve prolapse 12/26/2015  . Anxiety 11/02/2015    Patient's Medications  New Prescriptions   No medications on file  Previous Medications   ASPIRIN 81 MG EC TABLET    Take 1 tablet (81 mg total) by mouth daily.   ASPIRIN EC 325 MG TABLET    Take 325 mg by mouth daily as needed (headaches).   CHOLECALCIFEROL (VITAMIN D3) 25 MCG (1000 UNIT) TABLET    Take 1,000 Units by mouth daily.   GABAPENTIN (NEURONTIN) 300 MG CAPSULE    Take 600 mg by mouth at bedtime.   IBUPROFEN (ADVIL) 200 MG TABLET    Take 800 mg by mouth 2 (two) times daily.   MENTHOL, TOPICAL ANALGESIC, ([employer] COLD & HOT MEDICATED EX)    Apply 1 application topically daily as needed (pain).   METHOCARBAMOL (ROBAXIN) 500 MG TABLET    Take 1 tablet (500 mg total) by mouth every 8 (eight) hours as needed for muscle spasms.   OXYCODONE (OXY IR/ROXICODONE) 5 MG IMMEDIATE RELEASE TABLET    Take 1 tablet (5 mg total) by mouth every 4 (four) hours as needed for moderate pain (pain score 4-6).   TRIAMCINOLONE CREAM (KENALOG) 0.5 %    Apply topically.  Modified Medications   No medications on file  Discontinued Medications   CEFTRIAXONE (ROCEPHIN) IVPB    Inject 2 g into the vein daily. Indication:  Septic arthritis  First Dose: Yes Last Day of Therapy:  05/23/2021 Labs - Once weekly:   CBC/D and BMP, Labs - Every other week:  ESR and CRP Method of administration: IV Push Method of administration may be changed at the discretion of home infusion pharmacist based upon assessment of the patient and/or caregiver's ability to self-administer the medication ordered.   LINEZOLID (ZYVOX) 600 MG TABLET    Take 1 tablet (600 mg total) by mouth 2 (two) times daily.    Subjective: Becky Murphy is in for her hospital follow-up visit.  I treated her 1 year ago for her smoldering culture-negative right knee infection.  She appeared to have been cured only to have right knee pain and swelling began to recur in January of this year.  Her pain became progressively more severe leading her to go back to her orthopedic surgeon, Dr. Alphonzo Severance.  Arthrocentesis at that time showed no organisms on Gram stain and negative cultures.  Repeat arthrocentesis on 04/06/2021 revealed 45,090 white blood cells of which 83% were segmented neutrophils.  No crystals were seen.  She was admitted to the hospital and underwent arthroscopic washout with extensive debridement of the synovium.  Routine fungal and AFB stains are all negative.  Routine cultures were negative.  Fungal and AFB cultures are negative so far.  She was seen by my partner, Dr. Carlyle Basques, who elected to  have a PICC placed.  She was discharged on IV vancomycin and ceftriaxone on 04/12/2021.  I received a phone call on 04/16/2021 stating that she had developed a diffuse red pruritic rash.  I elected to discontinue vancomycin but continue ceftriaxone.    When I last saw her on 04/19/2021 her rash was fading.  I had added oral linezolid to her IV ceftriaxone.  She has now completed 23 days of total antibiotic therapy.  She states that she began to have problems with nausea shortly after she started linezolid.  She has not had any vomiting.  She has also noted some loose stool recently.  1 week ago she twisted her right knee while she was cleaning her bathroom  floor.  She had immediate, severe pain along the lateral aspect of her right knee extending down to mid calf.  She had sudden swelling of her knee that has only slowly improved over the last week.  She is still having severe pain that makes it difficult for her to walk.  Review of Systems: Review of Systems  Constitutional: Positive for malaise/fatigue. Negative for chills, diaphoresis and fever.  Gastrointestinal: Positive for diarrhea and nausea. Negative for abdominal pain and vomiting.  Musculoskeletal: Positive for joint pain.  Skin: Negative for itching and rash.    Past Medical History:  Diagnosis Date  . Anemia    low iron in the past  . Anxiety    pt denies  . Arthritis    "back?" (11/03/2015)  . Chest pain 11/02/2015  . Depression    pt denies  . Exertional dyspnea 01/05/2016  . GERD (gastroesophageal reflux disease)   . Heart murmur   . History of peptic ulcer "late 1970's"  . Migraine    "maybe 3-4/yr now" (11/03/2015)  . 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 Mitral valve prolapse 12/26/2015   symptomatic"MVP surgery postponed to get lesion of pancreas evaluated first".  . Pancreatic mass 01/29/2016   2.8 cm enhancing mass in the pancreatic neck and 3.1 cm low-attenuation cystic lesion in body of pancreas noted on CT angiogram  . PONV (postoperative nausea and vomiting)   . S/P minimally invasive mitral valve repair 03/06/2016   Complex valvuloplasty including triangular resection of posterior leaflet, artificial Gore-tex neochord placement x6 and 34 mm Memo 3D ring annuloplasty via right mini thoracotomy approach with clipping of LA appendage  . Thyroid nodule 01/29/2016   2.4 cm enhancing mixed cystic and solid nodule inferior left thyroid gland noted on CT angiogram    Social History   Tobacco Use  . Smoking status: Never Smoker  . Smokeless tobacco: Never Used  Vaping Use  . Vaping Use: Never used  Substance Use Topics  . Alcohol use: Yes     Alcohol/week: 10.0 standard drinks    Types: 6 Glasses of wine, 4 Shots of liquor per week    Comment: occasional  . Drug use: No    Family History  Problem Relation Age of Onset  . Hypertension Mother   . Cancer Mother 37       MELANOMA  . Cancer Father        PROSTATE  . Cancer Sister 45       BREAST    No Known Allergies  Objective: Vitals:   05/03/21 1030  BP: (!) 156/89  Pulse: 83  Temp: 97.7 F (36.5 C)  TempSrc: Oral  SpO2: 97%  Height: '5\' 6"'  (1.676 m)   Body mass index is  25.99 kg/m.  Physical Exam Constitutional:      Comments: She is accompanied by her husband.  She is seated in a wheelchair and appears to be uncomfortable due to pain.  Cardiovascular:     Rate and Rhythm: Normal rate and regular rhythm.     Heart sounds: No murmur heard.   Pulmonary:     Effort: Pulmonary effort is normal.     Breath sounds: Normal breath sounds.  Musculoskeletal:     Comments: She has mild diffuse swelling of her right knee.  There is no unusual redness or warmth.  Her incision sites are healing nicely.  Skin:    Comments: Left arm PICC site looks good.    Psychiatric:        Mood and Affect: Mood normal.     Lab Results Sed Rate  Date Value  04/11/2021 45 mm/hr (H)  04/06/2021 53 mm/h (H)  03/22/2021 17 mm/h   CRP  Date Value  04/11/2021 9.3 mg/dL (H)  04/06/2021 129.8 mg/L (H)  03/22/2021 29.1 mg/L (H)     Problem List Items Addressed This Visit      High   Septic arthritis of knee, right ([place])    Although she is still having severe pain after twisting her right knee last week it appears that her culture-negative septic arthritis has responded well to recent surgery and 23 days of empiric antibiotic therapy.  She is having difficulty tolerating her antibiotics so I will stop them now and have her PICC removed.  She will get blood work today and I will arrange phone follow-up in 1 week.      Relevant Orders   CBC   Basic metabolic panel    C-reactive protein   Sedimentation rate   B. burgdorfi antibodies   Vancomycin adverse reaction    Hypersensitivity reaction due to vancomycin has resolved.  I have noted that on her allergy list.      Therapeutic drug monitoring    I will get blood work today to see if she is having any adverse bone marrow suppression due to linezolid.          Michel Bickers, MD [facility] [phone] pager   [phone] cell 05/03/2021, 11:18 AM

## 2021-05-03 NOTE — Assessment & Plan Note (Signed)
I will get blood work today to see if she is having any adverse bone marrow suppression due to linezolid.

## 2021-05-03 NOTE — Telephone Encounter (Signed)
Called [facility] pharmacy, let Debbie know that patient's PICC was pulled at office visit. Andree Coss, RN

## 2021-05-03 NOTE — Discharge Summary (Signed)
Physician Discharge Summary      Patient ID: Becky Murphy MRN: [id number] DOB/AGE: [birth year] 70 y.o.  Admit date: 04/10/2021 Discharge date: 04/12/21  Admission Diagnoses:  Active Problems:   Septic arthritis of knee, right [facility])   Discharge Diagnoses:  Same  Surgeries: Procedure(s): right knee arthroscopy, debridement, placement of antibiotic beads on 04/10/2021   Consultants:   Discharged Condition: Stable  Hospital Course: Becky Murphy is an 70 y.o. female who was admitted 04/10/2021 with a chief complaint of right knee pain, and found to have a diagnosis of right knee infection.  They were brought to the operating room on 04/10/2021 and underwent the above named procedures.  Pt awoke from anesthesia without complication and was transferred to the floor. On POD1, patient had increased pain but overall pain was controlled.  She received PICC line and pain was improved by POD 2 so she was discharged home on POD 2..  Pt will f/u with Dr. Marlou Sa in clinic in ~1-2 weeks with follow-up by infectious disease as well.Marland Kitchen   Antibiotics given:  Anti-infectives (From admission, onward)   Start     Dose/Rate Route Frequency Ordered Stop   04/12/21 1300  vancomycin (VANCOREADY) IVPB 1500 mg/300 mL  Status:  Discontinued        1,500 mg 150 mL/hr over 120 Minutes Intravenous Every 24 hours 04/11/21 1514 04/12/21 2222   04/12/21 0000  cefTRIAXone (ROCEPHIN) IVPB        2 g Intravenous Every 24 hours 04/12/21 1234 05/24/21 2359   04/12/21 0000  vancomycin IVPB  Status:  Discontinued        1,500 mg Intravenous Every 24 hours 04/12/21 1234 04/12/21    04/12/21 0000  vancomycin IVPB  Status:  Discontinued        1,250 mg Intravenous Every 24 hours 04/12/21 1307 04/19/21    04/11/21 1230  cefTRIAXone (ROCEPHIN) 2 g in sodium chloride 0.9 % 100 mL IVPB  Status:  Discontinued        2 g 200 mL/hr over 30 Minutes Intravenous Every 24 hours 04/11/21 1142 04/12/21 2222   04/11/21 1230  vancomycin  (VANCOREADY) IVPB 1500 mg/300 mL        1,500 mg 150 mL/hr over 120 Minutes Intravenous  Once 04/11/21 1144 04/11/21 1453   04/11/21 0045  ceFAZolin (ANCEF) IVPB 1 g/50 mL premix  Status:  Discontinued        1 g 100 mL/hr over 30 Minutes Intravenous Every 8 hours 04/10/21 2010 04/11/21 1142   04/10/21 1705  vancomycin (VANCOCIN) powder  Status:  Discontinued          As needed 04/10/21 1705 04/10/21 1740   04/10/21 1705  gentamicin (GARAMYCIN) injection  Status:  Discontinued          As needed 04/10/21 1706 04/10/21 1740    .  Recent vital signs:  Vitals:   04/12/21 0856 04/12/21 1500  BP: 104/81 112/90  Pulse: 76 70  Resp: 18 18  Temp: 98.9 F (37.2 C) 98.3 F (36.8 C)  SpO2: 97% 97%    Recent laboratory studies:  Results for orders placed or performed during the hospital encounter of 04/10/21  SARS Coronavirus 2 by RT PCR (hospital order, performed in [place] hospital lab) Nasopharyngeal Nasopharyngeal Swab   Specimen: Nasopharyngeal Swab  Result Value Ref Range   SARS Coronavirus 2 NEGATIVE NEGATIVE  Fungus Culture With Stain   Specimen: Synovial, Right Knee; Body Fluid  Result Value Ref Range  Fungus Stain Final report    Fungus (Mycology) Culture PENDING    Fungal Source RIGHT   Fungus Culture With Stain   Specimen: Synovial, Right Knee; Body Fluid  Result Value Ref Range   Fungus Stain Final report    Fungus (Mycology) Culture PENDING    Fungal Source FLUID   Fungus Culture With Stain   Specimen: Synovial, Right Knee; Body Fluid  Result Value Ref Range   Fungus Stain Final report    Fungus (Mycology) Culture PENDING    Fungal Source FLUID   Aerobic/Anaerobic Culture w Gram Stain (surgical/deep wound)   Specimen: Synovial, Right Knee; Body Fluid  Result Value Ref Range   Specimen Description FLUID RIGHT KNEE A    Special Requests FLUID RIGHT KNEE A    Gram Stain      MODERATE WBC PRESENT,BOTH PMN AND MONONUCLEAR NO ORGANISMS SEEN    Culture       No growth aerobically or anaerobically. Performed at [facility], [place] [address], [place], [place] [address]    Report Status 04/16/2021 FINAL   Aerobic/Anaerobic Culture w Gram Stain (surgical/deep wound)   Specimen: Synovial, Right Knee; Body Fluid  Result Value Ref Range   Specimen Description FLUID RIGHT KNEE B    Special Requests FLUID RIGHT KNEE B    Gram Stain      MODERATE WBC PRESENT, PREDOMINANTLY PMN NO ORGANISMS SEEN    Culture      No growth aerobically or anaerobically. Performed at [facility], [place] [address] [place], [place] [address]    Report Status 04/16/2021 FINAL   Aerobic/Anaerobic Culture w Gram Stain (surgical/deep wound)   Specimen: Synovial, Right Knee; Body Fluid  Result Value Ref Range   Specimen Description FLUID RIGHT KNEE C    Special Requests FLUID RIGHT KNEE C    Gram Stain      MODERATE WBC PRESENT, PREDOMINANTLY PMN NO ORGANISMS SEEN    Culture      No growth aerobically or anaerobically. Performed at [facility], [place] [address] [place], [place] [address]    Report Status 04/16/2021 FINAL   Acid Fast Smear (AFB)   Specimen: Synovial, Right Knee; Body Fluid  Result Value Ref Range   AFB Specimen Processing Concentration    Acid Fast Smear Negative    Source (AFB) RIGHT   Acid Fast Smear (AFB)   Specimen: Synovial, Right Knee; Body Fluid  Result Value Ref Range   AFB Specimen Processing Concentration    Acid Fast Smear Negative    Source (AFB) FLUID   Acid Fast Smear (AFB)   Specimen: Synovial, Right Knee; Body Fluid  Result Value Ref Range   AFB Specimen Processing Concentration    Acid Fast Smear Negative    Source (AFB) FLUID   Fungus Culture Result  Result Value Ref Range   Result 1 Comment   Fungus Culture Result  Result Value Ref Range   Result 1 Comment   Fungus Culture Result  Result Value Ref Range   Result 1 Comment   Basic metabolic panel  Result Value Ref Range   Sodium 142 135 - 145  mmol/L   Potassium 2.8 (L) 3.5 - 5.1 mmol/L   Chloride 108 98 - 111 mmol/L   CO2 24 22 - 32 mmol/L   Glucose, Bld 102 (H) 70 - 99 mg/dL   BUN 11 8 - 23 mg/dL   Creatinine, Ser 0.61 0.44 - 1.00 mg/dL  Calcium 8.9 8.9 - 10.3 mg/dL   GFR, Estimated >60 >60 mL/min   Anion gap 10 5 - 15  CBC  Result Value Ref Range   WBC 4.7 4.0 - 10.5 K/uL   RBC 4.09 3.87 - 5.11 MIL/uL   Hemoglobin 12.3 12.0 - 15.0 g/dL   HCT 36.5 36.0 - 46.0 %   MCV 89.2 80.0 - 100.0 fL   MCH 30.1 26.0 - 34.0 pg   MCHC 33.7 30.0 - 36.0 g/dL   RDW 13.0 11.5 - 15.5 %   Platelets 352 150 - 400 K/uL   nRBC 0.0 0.0 - 0.2 %  Sedimentation rate  Result Value Ref Range   Sed Rate 45 (H) 0 - 22 mm/hr  C-reactive protein  Result Value Ref Range   CRP 9.3 (H) <1.0 mg/dL  Basic metabolic panel  Result Value Ref Range   Sodium 140 135 - 145 mmol/L   Potassium 4.0 3.5 - 5.1 mmol/L   Chloride 110 98 - 111 mmol/L   CO2 26 22 - 32 mmol/L   Glucose, Bld 120 (H) 70 - 99 mg/dL   BUN 20 8 - 23 mg/dL   Creatinine, Ser 0.73 0.44 - 1.00 mg/dL   Calcium 8.2 (L) 8.9 - 10.3 mg/dL   GFR, Estimated >60 >60 mL/min   Anion gap 4 (L) 5 - 15  CBC  Result Value Ref Range   WBC 7.2 4.0 - 10.5 K/uL   RBC 3.87 3.87 - 5.11 MIL/uL   Hemoglobin 11.7 (L) 12.0 - 15.0 g/dL   HCT 35.3 (L) 36.0 - 46.0 %   MCV 91.2 80.0 - 100.0 fL   MCH 30.2 26.0 - 34.0 pg   MCHC 33.1 30.0 - 36.0 g/dL   RDW 13.3 11.5 - 15.5 %   Platelets 334 150 - 400 K/uL   nRBC 0.0 0.0 - 0.2 %  Basic metabolic panel  Result Value Ref Range   Sodium 140 135 - 145 mmol/L   Potassium 3.8 3.5 - 5.1 mmol/L   Chloride 108 98 - 111 mmol/L   CO2 26 22 - 32 mmol/L   Glucose, Bld 96 70 - 99 mg/dL   BUN 16 8 - 23 mg/dL   Creatinine, Ser 0.62 0.44 - 1.00 mg/dL   Calcium 8.6 (L) 8.9 - 10.3 mg/dL   GFR, Estimated >60 >60 mL/min   Anion gap 6 5 - 15    Discharge Medications:   Allergies as of 04/12/2021   No Known Allergies     Medication List    TAKE these medications    aspirin EC 325 MG tablet Take 325 mg by mouth daily as needed (headaches).   aspirin 81 MG EC tablet Take 1 tablet (81 mg total) by mouth daily.   cefTRIAXone  IVPB Commonly known as: ROCEPHIN Inject 2 g into the vein daily. Indication:  Septic arthritis  First Dose: Yes Last Day of Therapy:  05/23/2021 Labs - Once weekly:  CBC/D and BMP, Labs - Every other week:  ESR and CRP Method of administration: IV Push Method of administration may be changed at the discretion of home infusion pharmacist based upon assessment of the patient and/or caregiver's ability to self-administer the medication ordered.   cholecalciferol 25 MCG (1000 UNIT) tablet Commonly known as: VITAMIN D3 Take 1,000 Units by mouth daily.   [employer] COLD & HOT MEDICATED EX Apply 1 application topically daily as needed (pain).   gabapentin 300 MG capsule Commonly known as: NEURONTIN Take  600 mg by mouth at bedtime.   ibuprofen 200 MG tablet Commonly known as: ADVIL Take 800 mg by mouth 2 (two) times daily.   methocarbamol 500 MG tablet Commonly known as: ROBAXIN Take 1 tablet (500 mg total) by mouth every 8 (eight) hours as needed for muscle spasms.   oxyCODONE 5 MG immediate release tablet Commonly known as: Oxy IR/ROXICODONE Take 1 tablet (5 mg total) by mouth every 4 (four) hours as needed for moderate pain (pain score 4-6).            Discharge Care Instructions  (From admission, onward)         Start     Ordered   04/12/21 0000  Change dressing on IV access line weekly and PRN  (Home infusion instructions - [facility] )        04/12/21 1234          Diagnostic Studies: [place] EKG SITE RITE  Result Date: 04/11/2021 If Site Rite image not attached, placement could not be confirmed due to current cardiac rhythm.   Disposition: Discharge disposition: 01-Home or Self Care       Discharge Instructions    [facility] pharmacist to adjust dose for Vancomycin,  Aminoglycosides and other anti-infective therapies as requested by physician.   Complete by: As directed    [facility] to provide Cath Flo 42m   Complete by: As directed    Administer for PICC line occlusion and as ordered by physician for other access device issues.   Anaphylaxis Kit: Provided to treat any anaphylactic reaction to the medication being provided to the patient if First Dose or when requested by physician   Complete by: As directed    Epinephrine 1672mml vial / amp: Administer 0.72m35m0.72ml52mubcutaneously once for moderate to severe anaphylaxis, nurse to call physician and pharmacy when reaction occurs and call 911 if needed for immediate care   Diphenhydramine 50mg22mIV vial: Administer 25-50mg 39mM PRN for first dose reaction, rash, itching, mild reaction, nurse to call physician and pharmacy when reaction occurs   Sodium Chloride 0.9% NS 500ml I49mdminister if needed for hypovolemic blood pressure drop or as ordered by physician after call to physician with anaphylactic reaction   Call MD / Call 911   Complete by: As directed    If you experience chest pain or shortness of breath, CALL 911 and be transported to the hospital emergency room.  If you develope a fever above 101 F, pus (white drainage) or increased drainage or redness at the wound, or calf pain, call your surgeon's office.   Change dressing on IV access line weekly and PRN   Complete by: As directed    Constipation Prevention   Complete by: As directed    Drink plenty of fluids.  Prune juice may be helpful.  You may use a stool softener, such as Colace (over the counter) 100 mg twice a day.  Use MiraLax (over the counter) for constipation as needed.   Diet - low sodium heart healthy   Complete by: As directed    Discharge instructions   Complete by: As directed    Weight bearing as tolerated with crutches or a walker. Use knee immobilizer at all times when up and walking.  Leave the water-proof  dressing on.  You may shower with that waterproof dressing on but do not soak in a tub/pool.   Begin straight-leg raises (30 reps each, 3 times per day) tomorrow.  Okay for gentle ROM exercises from 0-45 degrees but do not bend to 90 degrees yet. Call the office on-call number if any questions or concerns with IV antibiotics or with difficulty walking, etc. Follow-up with Dr. Marlou Sa at your given appointment date.   Flush IV access with Sodium Chloride 0.9% and Heparin 10 units/ml or 100 units/ml   Complete by: As directed    Home infusion instructions - [facility]   Complete by: As directed    Instructions: Flush IV access with Sodium Chloride 0.9% and Heparin 10units/ml or 100units/ml   Change dressing on IV access line: Weekly and PRN   Instructions Cath Flo 84m: Administer for PICC Line occlusion and as ordered by physician for other access device   [facility] pharmacist to adjust dose for: Vancomycin, Aminoglycosides and other anti-infective therapies as requested by physician   Increase activity slowly as tolerated   Complete by: As directed    Method of administration may be changed at the discretion of home infusion pharmacist based upon assessment of the patient and/or caregiver's ability to self-administer the medication ordered   Complete by: As directed    Outpatient Parenteral Antibiotic Therapy Information Antibiotic: Ceftriaxone (Rocephin) IVPB, Vancomycin IVPB; Indications for use: septic arthritis; End Date: 05/23/2021   Complete by: As directed    Antibiotic:  Ceftriaxone (Rocephin) IVPB Vancomycin IVPB     Indications for use: septic arthritis   End Date: 05/23/2021       Follow-up Information    [facility], [place].   Specialty: Home Health Services Why: Home health RN,PT [facility] Contact information: [phone]        [facility] Follow up.   Contact information: [phone]      CMichel Bickers MD Follow up.   Specialty: Infectious Diseases Why: 4/28 at 9:30 am. Please call to reschedule if you are not able to make this appointment.  Contact information: [phone]                Signed: CDonella Stade5/04/2021, 7:26 AM

## 2021-05-03 NOTE — Assessment & Plan Note (Signed)
Although she is still having severe pain after twisting her right knee last week it appears that her culture-negative septic arthritis has responded well to recent surgery and 23 days of empiric antibiotic therapy.  She is having difficulty tolerating her antibiotics so I will stop them now and have her PICC removed.  She will get blood work today and I will arrange phone follow-up in 1 week.

## 2021-05-03 NOTE — Assessment & Plan Note (Signed)
Hypersensitivity reaction due to vancomycin has resolved.  I have noted that on her allergy list.

## 2021-05-04 LAB — BASIC METABOLIC PANEL
BUN: 18 mg/dL (ref 7–25)
CO2: 25 mmol/L (ref 20–32)
Calcium: 9.2 mg/dL (ref 8.6–10.4)
Chloride: 106 mmol/L (ref 98–110)
Creat: 0.61 mg/dL (ref 0.50–0.99)
Glucose, Bld: 100 mg/dL — ABNORMAL HIGH (ref 65–99)
Potassium: 3.9 mmol/L (ref 3.5–5.3)
Sodium: 141 mmol/L (ref 135–146)

## 2021-05-04 LAB — B. BURGDORFI ANTIBODIES: B burgdorferi Ab IgG+IgM: <0.9 index

## 2021-05-04 LAB — SEDIMENTATION RATE: Sed Rate: 9 mm/h (ref 0–30)

## 2021-05-04 LAB — C-REACTIVE PROTEIN: CRP: 16.8 mg/L — ABNORMAL HIGH (ref <8.0)

## 2021-05-04 LAB — CBC
HCT: 37 % (ref 35.0–45.0)
Hemoglobin: 12.7 g/dL (ref 11.7–15.5)
MCH: 29.7 pg (ref 27.0–33.0)
MCHC: 34.3 g/dL (ref 32.0–36.0)
MCV: 86.4 fL (ref 80.0–100.0)
MPV: 10.5 fL (ref 7.5–12.5)
Platelets: 119 10*3/uL — ABNORMAL LOW (ref 140–400)
RBC: 4.28 10*6/uL (ref 3.80–5.10)
RDW: 13.6 % (ref 11.0–15.0)
WBC: 5.6 10*3/uL (ref 3.8–10.8)

## 2021-05-10 ENCOUNTER — Other Ambulatory Visit: Payer: Self-pay | Admitting: Surgical

## 2021-05-10 ENCOUNTER — Other Ambulatory Visit: Payer: Self-pay

## 2021-05-10 ENCOUNTER — Telehealth (INDEPENDENT_AMBULATORY_CARE_PROVIDER_SITE_OTHER): Payer: Medicare Other | Admitting: Internal Medicine

## 2021-05-10 DIAGNOSIS — M009 Pyogenic arthritis, unspecified: Secondary | ICD-10-CM | POA: Diagnosis not present

## 2021-05-10 LAB — FUNGAL ORGANISM REFLEX

## 2021-05-10 LAB — FUNGUS CULTURE WITH STAIN

## 2021-05-10 LAB — FUNGUS CULTURE RESULT

## 2021-05-10 NOTE — Progress Notes (Signed)
Virtual Visit via Telephone Note  I connected with Becky Murphy on 05/10/21 at  9:15 AM EDT by telephone and verified that I am speaking with the correct person using two identifiers.  Location: Patient: Home Provider: [facility]   I discussed the limitations, risks, security and privacy concerns of performing an evaluation and management service by telephone and the availability of in person appointments. I also discussed with the patient that there may be a patient responsible charge related to this service. The patient expressed understanding and agreed to proceed.   History of Present Illness: I called and spoke with Becky Murphy today.  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  She has not had any recurrent swelling.  She is still having sharp pain on the lateral aspect of her right knee.  She says that overall she feels a little bit better since coming off antibiotics.  She says that her physical therapist told her to limit walking.  She does get back and forth to the bathroom with her walker.  She is due to see her orthopedic surgeon, Dr. Dorene Grebe, on Wednesday, 05/16/2021.   Observations/Objective: CMP     Component Value Date/Time   NA 141 05/03/2021 1152   K 3.9 05/03/2021 1152   CL 106 05/03/2021 1152   CO2 25 05/03/2021 1152   GLUCOSE 100 (H) 05/03/2021 1152   BUN 18 05/03/2021 1152   CREATININE 0.61 05/03/2021 1152   CALCIUM 9.2 05/03/2021 1152   PROT 6.7 03/04/2016 1236   ALBUMIN 4.3 03/04/2016 1236   AST 19 03/04/2016 1236   ALT 12 (L) 03/04/2016 1236   ALKPHOS 60 03/04/2016 1236   BILITOT 1.1 03/04/2016 1236   GFRNONAA >60 04/12/2021 0836   GFRNONAA >89 05/15/2015 1017   GFRAA >60 04/04/2020 1105   GFRAA >89 05/15/2015 1017   Lab Results  Component Value Date   WBC 5.6 05/03/2021   HGB 12.7 05/03/2021   HCT 37.0 05/03/2021   MCV 86.4 05/03/2021   PLT 119 (L) 05/03/2021   Sed Rate  Date  Value  05/03/2021 9 mm/h  04/11/2021 45 mm/hr (H)  04/06/2021 53 mm/h (H)   CRP  Date Value  05/03/2021 16.8 mg/L (H)  04/11/2021 9.3 mg/dL (H)  33/82/5053 976.7 mg/L (H)   Total Lyme antibody negative  Assessment and Plan: Overall, she has improved following surgery and a course of empiric antibiotic therapy for 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  If this recurs again we will need to send tissue for molecular testing.  Follow Up Instructions: Observe off antibiotics for now Phone follow-up on Thursday, 05/17/2021   I discussed the assessment and treatment plan with the patient. The patient was provided an opportunity to ask questions and all were answered. The patient agreed with the plan and demonstrated an understanding of the instructions.   The patient was advised to call back or seek an in-person evaluation if the symptoms worsen or if the condition fails to improve as anticipated.  I provided 14 minutes of non-face-to-face time during this encounter.   Cliffton Asters, MD

## 2021-05-16 ENCOUNTER — Other Ambulatory Visit: Payer: Self-pay

## 2021-05-16 ENCOUNTER — Encounter: Payer: Self-pay | Admitting: Orthopedic Surgery

## 2021-05-16 ENCOUNTER — Ambulatory Visit (INDEPENDENT_AMBULATORY_CARE_PROVIDER_SITE_OTHER): Payer: Medicare Other

## 2021-05-16 ENCOUNTER — Ambulatory Visit (INDEPENDENT_AMBULATORY_CARE_PROVIDER_SITE_OTHER): Payer: Medicare Other | Admitting: Orthopedic Surgery

## 2021-05-16 DIAGNOSIS — M25461 Effusion, right knee: Secondary | ICD-10-CM | POA: Diagnosis not present

## 2021-05-16 MED ORDER — OXYCODONE HCL 5 MG PO TABS: 5.0000 mg | ORAL_TABLET | Freq: Every evening | ORAL | 0 refills | Status: DC | PRN

## 2021-05-16 NOTE — Progress Notes (Signed)
Post-Op Visit Note   Patient: Becky Murphy           Date of Birth: May 21, 1951           MRN: [id number] Visit Date: 05/16/2021 PCP: Kaleen Mask, MD   Assessment & Plan:  Chief Complaint:  Chief Complaint  Patient presents with  . Right Knee - Pain   Visit Diagnoses:  1. Effusion of right knee     Plan: Patient is a 70 year old female who presents s/p right knee arthroscopy with antibiotic bead placement on 04/10/2021.  She reports that she was doing a lot better and pain was improving until about 3 weeks ago when she was cleaning behind her toilet and twisted her right knee.  She complains of lateral sided pain that is worse with extension and she did note swelling after the initial injury.  She also has anterior pain with extending her knee.  She has a catching sensation in the lateral knee but denies any locking, groin pain, radicular pain.  The knee does feel like it wants to give way on her at times.  Denies any fevers, chills, night sweats, drainage.  Only mild effusion without any significant warmth on exam today.  No lymphadenopathy noted.  She was seen infectious disease who felt she was improving clinically and discontinued PICC line and she will follow-up with them as needed.  She has 3 degrees of extension 115 degrees of flexion.  Difficulty extending her knee due to pain but she is able to fire her quad and provide great resistance with quad strength testing.  No calf tenderness.  Negative Homans' sign.  No pain with hip range of motion.  Patient notes no improvement of her acute pain over the last 3 weeks despite the initial improvement in her chronic pain while on antibiotics.  Impression is improved septic arthritis of the right knee but acute inflammation of existing severe right knee osteoarthritis.  Currently taking Advil and gabapentin with oxycodone as needed at night.  Plan to refill this 1 last time and follow-up in 3 months for clinical recheck with labs  and aspiration to be performed then.  Of note, infectious disease did suggest sending a sample for molecular testing if she has recurrence of infectious symptoms.  Follow-Up Instructions: No follow-ups on file.   Orders:  Orders Placed This Encounter  Procedures  . XR Knee 1-2 Views Right   No orders of the defined types were placed in this encounter.   Imaging: No results found.  PMFS History: Patient Active Problem List   Diagnosis Date Noted  . Vancomycin adverse reaction 04/19/2021  . Therapeutic drug monitoring 04/19/2021  . Septic arthritis of knee, right (HCC) 04/10/2021  . Synovitis of right knee 04/04/2020  . Long term (current) use of anticoagulants [Z79.01] 03/14/2016  . S/P minimally invasive mitral valve repair 03/06/2016  . Pancreatic mass 01/29/2016  . Thyroid nodule 01/29/2016  . Exertional dyspnea 01/05/2016  . 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 Mitral insufficiency   . Mitral valve prolapse 12/26/2015  . Anxiety 11/02/2015   Past Medical History:  Diagnosis Date  . Anemia    low iron in the past  . Anxiety    pt denies  . Arthritis    "back?" (11/03/2015)  . Chest pain 11/02/2015  . Depression    pt denies  . Exertional dyspnea 01/05/2016  . GERD (gastroesophageal reflux disease)   . Heart murmur   . History  of peptic ulcer "late 1970's"  . Migraine    "maybe 3-4/yr now" (11/03/2015)  . 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 Mitral valve prolapse 12/26/2015   symptomatic"MVP surgery postponed to get lesion of pancreas evaluated first".  . Pancreatic mass 01/29/2016   2.8 cm enhancing mass in the pancreatic neck and 3.1 cm low-attenuation cystic lesion in body of pancreas noted on CT angiogram  . PONV (postoperative nausea and vomiting)   . S/P minimally invasive mitral valve repair 03/06/2016   Complex valvuloplasty including triangular resection of posterior leaflet, artificial Gore-tex neochord placement x6 and 34  mm Memo 3D ring annuloplasty via right mini thoracotomy approach with clipping of LA appendage  . Thyroid nodule 01/29/2016   2.4 cm enhancing mixed cystic and solid nodule inferior left thyroid gland noted on CT angiogram    Family History  Problem Relation Age of Onset  . Hypertension Mother   . Cancer Mother 52       MELANOMA  . Cancer Father        PROSTATE  . Cancer Sister 69       BREAST    Past Surgical History:  Procedure Laterality Date  . BACK SURGERY    . BREAST BIOPSY Left ~ 2005  . BREAST LUMPECTOMY Left ~ 2005  . CARDIAC CATHETERIZATION N/A 01/16/2016   Procedure: Right/Left Heart Cath and Coronary Angiography;  Surgeon: Thurmon Fair, MD;  Location: [facility]AS[facility] LAB;  Service: Cardiovascular;  Laterality: N/A;  . CLIPPING OF ATRIAL APPENDAGE Left 03/06/2016   Procedure: CLIPPING OF LEFT ATRIAL APPENDAGE using a 45 PRO2 AtriClip;  Surgeon: Purcell Nails, MD;  Location: [facility] OR;  Service: Open Heart Surgery;  Laterality: Left;  . COLONOSCOPY    . EUS N/A 02/20/2016   Procedure: ESOPHAGEAL ENDOSCOPIC ULTRASOUND (EUS) RADIAL;  Surgeon: Willis Modena, MD;  Location: [facility];  Service: Endoscopy;  Laterality: N/A;  . EYE MUSCLE SURGERY Bilateral 1970's?  Marland Kitchen KNEE ARTHROSCOPY Right 04/04/2020   Procedure: RIGHT KNEE ARTHROSCOPY WITH DEBRIDEMENT;  Surgeon: Cammy Copa, MD;  [facility];  Service: Orthopedics;  Laterality: Right;  . KNEE ARTHROSCOPY Right 04/10/2021   Procedure: right knee arthroscopy, debridement, placement of antibiotic beads;  Surgeon: Cammy Copa, MD;  Location: [facility] OR;  Service: Orthopedics;  Laterality: Right;  . LUMBAR DISC SURGERY  1992   "trimmed bulges off both sides"  . MITRAL VALVE REPAIR Right 03/06/2016   Procedure: MINIMALLY INVASIVE MITRAL VALVE REPAIR (MVR) using a 34 Sorin Memo 3D Ring;  Surgeon: Purcell Nails, MD;  Location: [facility] OR;  Service: Open Heart Surgery;  Laterality: Right;  . TEE WITHOUT CARDIOVERSION N/A 01/05/2016    Procedure: TRANSESOPHAGEAL ECHOCARDIOGRAM (TEE);  Surgeon: Thurmon Fair, MD;  Location: [facility] ENDOSCOPY;  Service: Cardiovascular;  Laterality: N/A;  . TEE WITHOUT CARDIOVERSION N/A 03/06/2016   Procedure: TRANSESOPHAGEAL ECHOCARDIOGRAM (TEE);  Surgeon: Purcell Nails, MD;  Location: [facility] OR;  Service: Open Heart Surgery;  Laterality: N/A;  . TUBAL LIGATION     Social History   Occupational History  . Not on file  Tobacco Use  . Smoking status: Never Smoker  . Smokeless tobacco: Never Used  Vaping Use  . Vaping Use: Never used  Substance and Sexual Activity  . Alcohol use: Yes    Alcohol/week: 10.0 standard drinks    Types: 6 Glasses of wine, 4 Shots of liquor per week    Comment: occasional  . Drug use: No  .  Sexual activity: Not Currently

## 2021-05-17 ENCOUNTER — Telehealth (INDEPENDENT_AMBULATORY_CARE_PROVIDER_SITE_OTHER): Payer: Medicare Other | Admitting: Internal Medicine

## 2021-05-17 DIAGNOSIS — M659 Synovitis and tenosynovitis, unspecified: Secondary | ICD-10-CM

## 2021-05-17 NOTE — Progress Notes (Signed)
Virtual Visit via Telephone Note  I connected with Becky Murphy on 05/17/21 at  9:15 AM EDT by telephone and verified that I am speaking with the correct person using two identifiers.  Location: Patient: Home Provider: [facility]   I discussed the limitations, risks, security and privacy concerns of performing an evaluation and management service by telephone and the availability of in person appointments. I also discussed with the patient that there may be a patient responsible charge related to this service. The patient expressed understanding and agreed to proceed.   History of Present Illness: I called and spoke with Ms. Finchum today.  She continues to have sharp pain on her lateral side of her right knee.  It bothers her when she is standing and trying to get around with her walker.  She has not noted any new swelling, redness or warmth of her knee.  She has not had any fever.  She followed up with her orthopedic surgeon, Dr. Dorene Grebe yesterday.  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  He was concerned that she had some effusion.  She was instructed to follow-up there in 3 months to consider arthrocentesis.   Observations/Objective:   Assessment and Plan: She has had 2 bouts of culture-negative right knee synovitis in the past year.  If she has any persistent effusion or other signs of possible infection she will need further diagnostic evaluation.  Hopefully we can find an outside the laboratory to do more in-depth molecular testing to see if we can find a culprit pathogen to give more targeted therapy.  Follow Up Instructions: Continue observation off of antibiotics and follow-up here in 2 months   I discussed the assessment and treatment plan with the patient. The patient was provided an opportunity to ask questions and all were answered. The patient agreed with the plan and demonstrated an understanding of the instructions.   The patient was advised to call back  or seek an in-person evaluation if the symptoms worsen or if the condition fails to improve as anticipated.  I provided 15 minutes of non-face-to-face time during this encounter.   Cliffton Asters, MD

## 2021-05-25 LAB — ACID FAST CULTURE WITH REFLEXED SENSITIVITIES (MYCOBACTERIA)
Acid Fast Culture: NEGATIVE
Acid Fast Culture: NEGATIVE

## 2021-06-11 ENCOUNTER — Telehealth: Payer: Self-pay

## 2021-06-11 NOTE — Telephone Encounter (Signed)
Discussed with Dr Dean/Luke We will see her as work in on Wednesday.   Also advised ok to take 2 oxy 1-2 times a day until we see her Wednesday.

## 2021-06-11 NOTE — Telephone Encounter (Signed)
Patient called stating that her left knee is swollen, painful, and hot.  Would like to know if she could be worked into the schedule or if something could be sent in?  Stated that it's a stabbing pain in the left knee and that Ibuprofen and Oxycodone are not helping.  Has an appt.on Friday, 06/15/2021.  Cb# [phone].  Please advise.  Thank you

## 2021-06-13 ENCOUNTER — Ambulatory Visit: Payer: Self-pay

## 2021-06-13 ENCOUNTER — Encounter: Payer: Self-pay | Admitting: Orthopedic Surgery

## 2021-06-13 ENCOUNTER — Ambulatory Visit (INDEPENDENT_AMBULATORY_CARE_PROVIDER_SITE_OTHER): Payer: Medicare Other | Admitting: Orthopedic Surgery

## 2021-06-13 DIAGNOSIS — M25462 Effusion, left knee: Secondary | ICD-10-CM | POA: Diagnosis not present

## 2021-06-13 DIAGNOSIS — M25562 Pain in left knee: Secondary | ICD-10-CM

## 2021-06-13 MED ORDER — METHOCARBAMOL 500 MG PO TABS: 500.0000 mg | ORAL_TABLET | Freq: Three times a day (TID) | ORAL | 0 refills | Status: DC | PRN

## 2021-06-13 NOTE — Progress Notes (Signed)
Office Visit Note   Patient: Becky Murphy           Date of Birth: Sep 04, 1951           MRN: [id number] Visit Date: 06/13/2021 Requested by: Leonard Downing, MD [address] [place],  [place] [address] PCP: Leonard Downing, MD  Subjective: Chief Complaint  Patient presents with   Left Knee - Pain    HPI: Becky Murphy is a 70 y.o. female who presents to the office complaining of left knee pain.  Patient reports increased pain over the last 3 weeks with increased swelling in the left knee.  This feels similar to her previous right knee pain.  Patient has a history of right knee septic arthritis for which she has required to separate arthroscopies about a year apart with treatment with IV antibiotics through PICC line.  She has been off antibiotics since 04/21/2021.  She was in physical therapy but now only doing occasional home exercise program exercises.  Taking oxycodone, gabapentin, methocarbamol for pain control.  Denies any fevers, chills, night sweats, drainage.  Right knee is actually feeling pretty good and states it is "the best is felt in 2 years".  Denies any chest pain, shortness of breath.  She does have associated calf pain.  No groin pain..                ROS: All systems reviewed are negative as they relate to the chief complaint within the history of present illness.  Patient denies fevers or chills.  Assessment & Plan: Visit Diagnoses:  1. Acute pain of left knee   2. Effusion, left knee     Plan: Patient is a 70 year old female who presents complaint of left knee pain.  She has history of right knee septic arthritis which has required 2 separate arthroscopies with irrigation and debridement.  Last surgery was on 04/10/2021 and she has been off antibiotics since 04/21/2021.  She had no organism that was grown on either culture.  Right knee is doing well but now her left knee is the main concern.  Left knee radiographs taken today are negative for any acute  injury.  Plan to aspirate the left knee and send fluid for analysis and culture.  Also took blood work today.  We will call patient with results and plan based on the results.  Follow-Up Instructions: No follow-ups on file.   Orders:  Orders Placed This Encounter  Procedures   Gram stain   Anaerobic and Aerobic Culture   XR Knee 1-2 Views Left   Cell count + diff,  w/ cryst-synvl fld   Sed Rate (ESR)   C-reactive protein   CBC with Differential   Synovial Fluid Analysis, Complete   VAS [place] LOWER EXTREMITY VENOUS (DVT)   No orders of the defined types were placed in this encounter.     Procedures: Large Joint Inj: L knee on 06/13/2021 10:00 AM Indications: diagnostic evaluation, joint swelling and pain Details: 18 G 1.5 in needle, superolateral approach  Arthrogram: No  Medications: 5 mL lidocaine 1 % Aspirate: 30 mL cloudy and bloody; sent for lab analysis Outcome: tolerated well, no immediate complications Procedure, treatment alternatives, risks and benefits explained, specific risks discussed. Consent was given by the patient. Immediately prior to procedure a time out was called to verify the correct patient, procedure, equipment, support staff and site/side marked as required. Patient was prepped and draped in the usual sterile fashion.  Clinical Data: No additional findings.  Objective: Vital Signs: There were no vitals taken for this visit.  Physical Exam:  Constitutional: Patient appears well-developed HEENT:  Head: Normocephalic Eyes:EOM are normal Neck: Normal range of motion Cardiovascular: Normal rate Pulmonary/chest: Effort normal Neurologic: Patient is alert Skin: Skin is warm Psychiatric: Patient has normal mood and affect  Ortho Exam: Ortho exam demonstrates left knee with 0 degrees extension and greater than 110 degrees of knee flexion.  Positive effusion present.  Approximately 50 cc.  Extensor mechanism intact warmth throughout the left knee  compared with the contralateral knee.  Mild calf tenderness.  .  Tenderness severely over both joint lines.  No proximal lymphadenopathy on the left-hand side.  Specialty Comments:  No specialty comments available.  Imaging: No results found.   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   Mitral insufficiency    Mitral valve prolapse 12/26/2015   Anxiety 11/02/2015   Past Medical History:  Diagnosis Date   Anemia    low iron in the past   Anxiety    pt denies   Arthritis    "back?" (11/03/2015)   Chest pain 11/02/2015   Depression    pt denies   Exertional dyspnea 01/05/2016   GERD (gastroesophageal reflux disease)    Heart murmur    History of peptic ulcer "late 1970's"   Migraine    "maybe 3-4/yr now" (11/03/2015)   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   Complex valvuloplasty including triangular resection of posterior leaflet, artificial Gore-tex neochord placement x6 and 34 mm Memo 3D ring annuloplasty via right mini thoracotomy approach with clipping of LA appendage   Thyroid nodule 01/29/2016   2.4 cm enhancing mixed cystic and solid nodule  inferior left thyroid gland noted on CT angiogram    Family History  Problem Relation Age of Onset   Hypertension Mother    Cancer Mother 58       MELANOMA   Cancer Father        PROSTATE   Cancer Sister 63       BREAST    Past Surgical History:  Procedure Laterality Date   BACK SURGERY     BREAST BIOPSY Left ~ 2005   BREAST LUMPECTOMY Left ~ 2005   CARDIAC CATHETERIZATION N/A 01/16/2016   Procedure: Right/Left Heart Cath and Coronary Angiography;  Surgeon: Sanda Klein, MD;  Location: [facility];  Service: Cardiovascular;  Laterality: N/A;   CLIPPING OF ATRIAL APPENDAGE Left 03/06/2016   Procedure: CLIPPING OF LEFT ATRIAL APPENDAGE using a 25 PRO2 AtriClip;  Surgeon: Rexene Alberts, MD;  Location: [facility];  Service: Open Heart Surgery;  Laterality: Left;  COLONOSCOPY     EUS N/A 02/20/2016   Procedure: ESOPHAGEAL ENDOSCOPIC ULTRASOUND (EUS) RADIAL;  Surgeon: Arta Silence, MD;  Location: [facility];  Service: Endoscopy;  Laterality: N/A;   EYE MUSCLE SURGERY Bilateral 1970's?   KNEE ARTHROSCOPY Right 04/04/2020   Procedure: RIGHT KNEE ARTHROSCOPY WITH DEBRIDEMENT;  Surgeon: Meredith Pel, MD;  Location: [facility];  Service: Orthopedics;  Laterality: Right;   KNEE ARTHROSCOPY Right 04/10/2021   Procedure: right knee arthroscopy, debridement, placement of antibiotic beads;  Surgeon: Meredith Pel, MD;  Location: [facility];  Service: Orthopedics;  Laterality: Right;   [place]   "trimmed bulges off both sides"   MITRAL VALVE REPAIR Right 03/06/2016   Procedure: MINIMALLY INVASIVE MITRAL VALVE REPAIR (MVR) using a 12 Sorin Memo 3D Ring;  Surgeon: Rexene Alberts, MD;  Location: [facility];  Service: Open Heart Surgery;  Laterality: Right;   TEE WITHOUT CARDIOVERSION N/A 01/05/2016   Procedure: TRANSESOPHAGEAL ECHOCARDIOGRAM (TEE);  Surgeon: Sanda Klein, MD;  Location: [facility];  Service: Cardiovascular;  Laterality: N/A;   TEE WITHOUT CARDIOVERSION N/A  03/06/2016   Procedure: TRANSESOPHAGEAL ECHOCARDIOGRAM (TEE);  Surgeon: Rexene Alberts, MD;  Location: [facility];  Service: Open Heart Surgery;  Laterality: N/A;   TUBAL LIGATION     Social History   Occupational History   Not on file  Tobacco Use   Smoking status: Never   Smokeless tobacco: Never  Vaping Use   Vaping Use: Never used  Substance and Sexual Activity   Alcohol use: Yes    Alcohol/week: 10.0 standard drinks    Types: 6 Glasses of wine, 4 Shots of liquor per week    Comment: occasional   Drug use: No   Sexual activity: Not Currently

## 2021-06-14 MED ORDER — LIDOCAINE HCL 1 % IJ SOLN
5.0000 mL | INTRAMUSCULAR | Status: AC | PRN
  Administered 2021-06-13: 5 mL

## 2021-06-15 ENCOUNTER — Telehealth: Payer: Self-pay

## 2021-06-15 ENCOUNTER — Ambulatory Visit: Payer: Medicare Other | Admitting: Surgical

## 2021-06-15 ENCOUNTER — Other Ambulatory Visit: Payer: Self-pay

## 2021-06-15 ENCOUNTER — Ambulatory Visit (HOSPITAL_COMMUNITY)
Admission: RE | Admit: 2021-06-15 | Discharge: 2021-06-15 | Disposition: A | Discharge status: Home / Self Care | Payer: Medicare Other | Source: Ambulatory Visit | Attending: Orthopedic Surgery | Admitting: Orthopedic Surgery

## 2021-06-15 DIAGNOSIS — M25462 Effusion, left knee: Secondary | ICD-10-CM | POA: Diagnosis present

## 2021-06-15 NOTE — Telephone Encounter (Signed)
Doppler was negative 

## 2021-06-15 NOTE — Telephone Encounter (Signed)
Thanks Also, Lauren: she is coming in on wednesday at 10:30 for repeat aspiration

## 2021-06-18 LAB — ACID FAST CULTURE WITH REFLEXED SENSITIVITIES (MYCOBACTERIA): Acid Fast Culture: NEGATIVE

## 2021-06-18 NOTE — Telephone Encounter (Signed)
scheduled

## 2021-06-19 LAB — CBC WITH DIFFERENTIAL/PLATELET
Absolute Monocytes: 571 cells/uL (ref 200–950)
Basophils Absolute: 82 cells/uL (ref 0–200)
Basophils Relative: 0.8 %
Eosinophils Absolute: 163 cells/uL (ref 15–500)
Eosinophils Relative: 1.6 %
HCT: 38.7 % (ref 35.0–45.0)
Hemoglobin: 12.4 g/dL (ref 11.7–15.5)
Lymphs Abs: 1377 cells/uL (ref 850–3900)
MCH: 27.7 pg (ref 27.0–33.0)
MCHC: 32 g/dL (ref 32.0–36.0)
MCV: 86.6 fL (ref 80.0–100.0)
MPV: 10 fL (ref 7.5–12.5)
Monocytes Relative: 5.6 %
Neutro Abs: 8007 cells/uL — ABNORMAL HIGH (ref 1500–7800)
Neutrophils Relative %: 78.5 %
Platelets: 438 10*3/uL — ABNORMAL HIGH (ref 140–400)
RBC: 4.47 10*6/uL (ref 3.80–5.10)
RDW: 14.6 % (ref 11.0–15.0)
Total Lymphocyte: 13.5 %
WBC: 10.2 10*3/uL (ref 3.8–10.8)

## 2021-06-19 LAB — SYNOVIAL FLUID ANALYSIS, COMPLETE
Basophils, %: 0 %
Eosinophils-Synovial: 0 % (ref 0–2)
Lymphocytes-Synovial Fld: 15 % (ref 0–74)
Monocyte/Macrophage: 4 % (ref 0–69)
Neutrophil, Synovial: 81 % — ABNORMAL HIGH (ref 0–24)
Synoviocytes, %: 0 % (ref 0–15)
WBC, Synovial: 25700 cells/uL — ABNORMAL HIGH (ref <150)

## 2021-06-19 LAB — ANAEROBIC AND AEROBIC CULTURE
AER RESULT:: NO GROWTH
MICRO NUMBER:: 12010912
MICRO NUMBER:: 12010913
SPECIMEN QUALITY:: ADEQUATE
SPECIMEN QUALITY:: ADEQUATE

## 2021-06-19 LAB — C-REACTIVE PROTEIN: CRP: 59.6 mg/L — ABNORMAL HIGH (ref <8.0)

## 2021-06-19 LAB — SEDIMENTATION RATE: Sed Rate: 34 mm/h — ABNORMAL HIGH (ref 0–30)

## 2021-06-20 ENCOUNTER — Ambulatory Visit (INDEPENDENT_AMBULATORY_CARE_PROVIDER_SITE_OTHER): Payer: Medicare Other | Admitting: Orthopedic Surgery

## 2021-06-20 DIAGNOSIS — M25462 Effusion, left knee: Secondary | ICD-10-CM

## 2021-06-20 DIAGNOSIS — M25562 Pain in left knee: Secondary | ICD-10-CM

## 2021-06-21 LAB — CBC WITH DIFFERENTIAL/PLATELET
Absolute Monocytes: 539 cells/uL (ref 200–950)
Basophils Absolute: 88 cells/uL (ref 0–200)
Basophils Relative: 0.9 %
Eosinophils Absolute: 245 cells/uL (ref 15–500)
Eosinophils Relative: 2.5 %
HCT: 40.4 % (ref 35.0–45.0)
Hemoglobin: 12.4 g/dL (ref 11.7–15.5)
Lymphs Abs: 1754 cells/uL (ref 850–3900)
MCH: 26.7 pg — ABNORMAL LOW (ref 27.0–33.0)
MCHC: 30.7 g/dL — ABNORMAL LOW (ref 32.0–36.0)
MCV: 87.1 fL (ref 80.0–100.0)
MPV: 9.9 fL (ref 7.5–12.5)
Monocytes Relative: 5.5 %
Neutro Abs: 7174 cells/uL (ref 1500–7800)
Neutrophils Relative %: 73.2 %
Platelets: 403 10*3/uL — ABNORMAL HIGH (ref 140–400)
RBC: 4.64 10*6/uL (ref 3.80–5.10)
RDW: 15.6 % — ABNORMAL HIGH (ref 11.0–15.0)
Total Lymphocyte: 17.9 %
WBC: 9.8 10*3/uL (ref 3.8–10.8)

## 2021-06-21 LAB — SEDIMENTATION RATE: Sed Rate: 25 mm/h (ref 0–30)

## 2021-06-21 LAB — C-REACTIVE PROTEIN: CRP: 16.5 mg/L — ABNORMAL HIGH (ref <8.0)

## 2021-06-21 NOTE — Progress Notes (Signed)
Tried calling patient. No answer, no VM to LM 

## 2021-06-21 NOTE — Progress Notes (Signed)
Numbers trending down.  We will see what the cell count his hand follow-up in 2 weeks no imminent surgery

## 2021-06-22 ENCOUNTER — Telehealth: Payer: Self-pay

## 2021-06-22 NOTE — Telephone Encounter (Signed)
IC advised patient of labs per note from Dr August Saucer on lab results She will follow up in 2 weeks.

## 2021-06-22 NOTE — Telephone Encounter (Signed)
Sounds good, thanks.

## 2021-06-22 NOTE — Telephone Encounter (Signed)
Pt called asking if we have the results from the fluid that was sent off

## 2021-06-23 ENCOUNTER — Encounter: Payer: Self-pay | Admitting: Orthopedic Surgery

## 2021-06-23 NOTE — Progress Notes (Signed)
   Procedure Note  Patient: Becky Murphy             Date of Birth: 26-Aug-1951           MRN: [id number]             Visit Date: 06/20/2021  Procedures: Visit Diagnoses:  1. Acute pain of left knee   2. Effusion, left knee     Large Joint Inj: L knee on 06/20/2021 2:51 PM Indications: diagnostic evaluation, joint swelling and pain Details: 18 G 1.5 in needle, superolateral approach  Arthrogram: No  Medications: 5 mL lidocaine 1 % Aspirate: 20 mL bloody and cloudy Outcome: tolerated well, no immediate complications  Patient returns for repeat aspiration to see if there is a trend regarding her cell count in the synovial fluid low for the left knee.  She has had return of left knee effusion since prior aspiration last week.  Plan to aspirate and send off for synovial fluid analysis with further plan based on these results.  Potential for left knee arthroscopic debridement if cell count is progressively increasing. Procedure, treatment alternatives, risks and benefits explained, specific risks discussed. Consent was given by the patient. Immediately prior to procedure a time out was called to verify the correct patient, procedure, equipment, support staff and site/side marked as required. Patient was prepped and draped in the usual sterile fashion.

## 2021-06-24 MED ORDER — LIDOCAINE HCL 1 % IJ SOLN
5.0000 mL | INTRAMUSCULAR | Status: AC | PRN
  Administered 2021-06-20: 5 mL

## 2021-06-26 LAB — ANAEROBIC AND AEROBIC CULTURE
AER RESULT:: NO GROWTH
MICRO NUMBER:: 12037324
MICRO NUMBER:: 12037325
SPECIMEN QUALITY:: ADEQUATE
SPECIMEN QUALITY:: ADEQUATE

## 2021-06-26 LAB — SYNOVIAL FLUID ANALYSIS, COMPLETE
Basophils, %: 0 %
Eosinophils-Synovial: 0 % (ref 0–2)
Lymphocytes-Synovial Fld: 18 % (ref 0–74)
Monocyte/Macrophage: 5 % (ref 0–69)
Neutrophil, Synovial: 77 % — ABNORMAL HIGH (ref 0–24)
Synoviocytes, %: 0 % (ref 0–15)
WBC, Synovial: 8400 cells/uL — ABNORMAL HIGH (ref <150)

## 2021-07-18 ENCOUNTER — Other Ambulatory Visit: Payer: Self-pay

## 2021-07-18 ENCOUNTER — Ambulatory Visit (INDEPENDENT_AMBULATORY_CARE_PROVIDER_SITE_OTHER): Payer: Medicare Other | Admitting: Orthopedic Surgery

## 2021-07-18 DIAGNOSIS — M25561 Pain in right knee: Secondary | ICD-10-CM | POA: Diagnosis not present

## 2021-07-18 DIAGNOSIS — M25562 Pain in left knee: Secondary | ICD-10-CM

## 2021-07-18 DIAGNOSIS — G8929 Other chronic pain: Secondary | ICD-10-CM

## 2021-07-19 ENCOUNTER — Ambulatory Visit (INDEPENDENT_AMBULATORY_CARE_PROVIDER_SITE_OTHER): Payer: Medicare Other | Admitting: Internal Medicine

## 2021-07-19 ENCOUNTER — Other Ambulatory Visit: Payer: Self-pay

## 2021-07-19 ENCOUNTER — Encounter: Payer: Self-pay | Admitting: Internal Medicine

## 2021-07-19 DIAGNOSIS — M65962 Unspecified synovitis and tenosynovitis, left lower leg: Secondary | ICD-10-CM | POA: Insufficient documentation

## 2021-07-19 DIAGNOSIS — M659 Synovitis and tenosynovitis, unspecified: Secondary | ICD-10-CM | POA: Diagnosis present

## 2021-07-19 NOTE — Assessment & Plan Note (Signed)
She has now had 3 bouts of culture-negative cellulitis involving both knees.  She has had no other joint involvement.  There is no current indication to restart antibiotics.  I will follow-up results of the upcoming arthrocentesis on 08/09/2021.

## 2021-07-19 NOTE — Progress Notes (Signed)
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   GABAPENTIN (NEURONTIN) 300 MG CAPSULE    Take 600 mg by mouth at bedtime.   IBUPROFEN (ADVIL) 200 MG TABLET    Take 800 mg by mouth 2 (two) times daily.   MENTHOL, TOPICAL ANALGESIC, ([employer] COLD & HOT MEDICATED EX)    Apply 1 application topically daily as needed (pain).   METHOCARBAMOL (ROBAXIN) 500 MG TABLET    Take 1 tablet (500 mg total) by mouth every 8 (eight) hours as needed for muscle spasms.   OXYCODONE (OXY IR/ROXICODONE) 5 MG IMMEDIATE RELEASE TABLET    Take 1 tablet (5 mg total) by mouth at bedtime as needed for moderate pain (pain score 4-6).   TRIAMCINOLONE CREAM (KENALOG) 0.5 %    Apply topically.  Modified Medications   No medications on file  Discontinued Medications   No medications on file   Subjective: Becky Murphy is in with her husband for her routine follow-up.  She has had 2 previous bouts of right knee synovitis with negative cultures and no evidence of crystal induced arthritis.  She recently developed acute pain and swelling of her left knee.   She underwent left knee arthrocentesis on 06/13/2021.  Synovial fluid analyses revealed 25,700 white blood cells of which 81% were segmented neutrophils.  No crystals were seen flFid culture was negative.  She underwent repeat arthrocentesis on 06/20/2021.  Her white blood cell count was down to 8400 of which 77% were segmented neutrophils.  No crystals were seen and cultures were negative again.  She had prompt improvement after fluid was drawn off of her knee.  She did not have any fever chills or sweats.  She was not treated with any antibiotics.  She tells me that she is scheduled for a repeat right knee arthrocentesis on 08/09/2021 that Dr. Dorene Grebe told her that if cultures remain negative she may be able to undergo right knee replacement early next year.  Review of Systems: Review of Systems  Constitutional:  Negative for chills, diaphoresis and fever.  Musculoskeletal:  Positive for joint pain.   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   Complex valvuloplasty including triangular resection of posterior leaflet, artificial Gore-tex neochord placement x6 and 34 mm Memo 3D ring annuloplasty via right mini thoracotomy approach with clipping of LA appendage    Thyroid nodule 01/29/2016   2.4 cm enhancing mixed cystic and solid nodule inferior left thyroid gland noted on CT angiogram    Social History   Tobacco Use   Smoking status: Never   Smokeless tobacco: Never  Vaping Use   Vaping Use: Never used  Substance Use Topics   Alcohol use: Yes    Alcohol/week: 10.0 standard drinks    Types: 6 Glasses of wine, 4 Shots of liquor per week    Comment: occasional   Drug use: No    Family History  Problem Relation Age of Onset   Hypertension Mother    Cancer Mother 19       MELANOMA   Cancer Father        PROSTATE   Cancer Sister 57       BREAST    Allergies  Allergen Reactions   Vancomycin Rash    Objective: Vitals:   07/19/21 1122  BP: 129/87  Pulse: 80  Temp: 98.2 F (36.8 C)  TempSrc: Oral  Weight: 145 lb (65.8 kg)   Body mass index is 23.4 kg/m.  Physical Exam Constitutional:      Comments: Her spirits are good.  Cardiovascular:     Rate and Rhythm: Normal rate.  Pulmonary:     Effort: Pulmonary effort is normal.  Musculoskeletal:        General: Swelling present.     Comments: She appears to have a small joint effusion on her right knee.  There is no unusual redness or warmth.  She has good range of motion.  I do not appreciate any effusion in her left knee.    Lab Results    Problem List Items Addressed This Visit       High   Synovitis of left knee    She has now had 3 bouts of culture-negative cellulitis involving both knees.  She has had no other joint involvement.  There is no current indication to restart antibiotics.  I will follow-up results of the upcoming arthrocentesis on 08/09/2021.         Cliffton Asters, MD [facility] [phone] pager   [phone] cell 07/19/2021, 11:59 AM

## 2021-07-23 ENCOUNTER — Encounter: Payer: Self-pay | Admitting: Orthopedic Surgery

## 2021-07-23 NOTE — Progress Notes (Signed)
0.  Office Visit Note   Patient: Becky Murphy           Date of Birth: [birth year]           MRN: [id number] Visit Date: 07/18/2021 Requested by: Kaleen Mask, MD [address] [place],  [place] [address] PCP: Kaleen Mask, MD  Subjective: Chief Complaint  Patient presents with   Left Knee - Pain, Follow-up   Right Knee - Pain, Follow-up    HPI: Becky Murphy is a 70 y.o. female who presents to the office complaining of bilateral knee pain.  Patient notes that she is doing some better.  She is able to walk some more.  She has good days and bad days but overall her left knee feels to be improving.  She denies any fevers, chills, night sweats.  She does report the right knee gives out on her sometimes.  She has history of right knee infection that was successfully treated though the cultures never grew anything.  She has severe right knee arthritis that is causing her daily pain.  Overall she feels she is trending in the right direction with both knees with the left knee improving in the right knee basically at her baseline..                ROS: All systems reviewed are negative as they relate to the chief complaint within the history of present illness.  Patient denies fevers or chills.  Assessment & Plan: Visit Diagnoses:  1. Chronic pain of both knees     Plan: Patient is a 70 year old female who presents complaining of bilateral knee pain.  She had 2 aspirations of her left knee recently after suspected septic arthritis but the cell counts were trending down and now she has gotten to the point where her left knee is significantly improved and she has no effusion currently.  She has trace effusion of the right knee and this is the knee that is mostly bothering her currently.  She has history of severe osteoarthritis of the right knee but total knee arthroplasty is fairly risky in [place] given her history of right knee infection.  Her husband has recently been  diagnosed with recurrence of cancer and is undergoing some scans in order to stage the cancer and decide on treatment options.  She is planning to return on August 11 for discussion on potential for total knee arthroplasty in the future based on her current symptoms and her husband's plan for cancer treatment.  She understands she has a significantly elevated risk of prosthetic joint infection and would require several negative aspirations prior to any procedure for proceeding, and even this does not rule out the chance of PJI.  Follow-up in August.  Follow-Up Instructions: No follow-ups on file.   Orders:  No orders of the defined types were placed in this encounter.  No orders of the defined types were placed in this encounter.     Procedures: No procedures performed   Clinical Data: No additional findings.  Objective: Vital Signs: There were no vitals taken for this visit.  Physical Exam:  Constitutional: Patient appears well-developed HEENT:  Head: Normocephalic Eyes:EOM are normal Neck: Normal range of motion Cardiovascular: Normal rate Pulmonary/chest: Effort normal Neurologic: Patient is alert Skin: Skin is warm Psychiatric: Patient has normal mood and affect  Ortho Exam: Ortho exam demonstrates right knee with trace effusion.  Left knee without any effusion.  Mild warmth over both knees.  Tenderness over medial and lateral joint lines of both knees.  0 degrees extension and greater than 90 degrees of knee flexion.  No calf tenderness.  Negative Homans' sign.  No pain with hip range of motion.  Able to perform straight leg raise with both legs.  Specialty Comments:  No specialty comments available.  Imaging: No results found.   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   Mitral insufficiency    Mitral valve prolapse 12/26/2015   Anxiety 11/02/2015   Past Medical History:  Diagnosis Date   Anemia    low iron in the past   Anxiety    pt denies   Arthritis    "back?" (11/03/2015)   Chest pain 11/02/2015   Depression    pt denies   Exertional dyspnea 01/05/2016   GERD (gastroesophageal reflux disease)    Heart murmur    History of peptic ulcer "late 1970's"   Migraine    "maybe 3-4/yr now" (11/03/2015)   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   Complex valvuloplasty including triangular resection of posterior leaflet, artificial Gore-tex neochord placement x6 and 34 mm Memo 3D ring annuloplasty via right mini thoracotomy approach with clipping of LA appendage   Thyroid nodule 01/29/2016   2.4 cm enhancing mixed cystic and solid nodule inferior left thyroid gland noted on CT angiogram    Family History  Problem Relation Age of Onset   Hypertension Mother    Cancer Mother 84       MELANOMA   Cancer Father        PROSTATE   Cancer Sister 101       BREAST    Past Surgical History:  Procedure Laterality Date   BACK SURGERY     BREAST BIOPSY Left ~ 2005   BREAST LUMPECTOMY Left ~ 2005   CARDIAC CATHETERIZATION N/A 01/16/2016   Procedure: Right/Left Heart Cath and Coronary Angiography;  Surgeon: Thurmon Fair, MD;   [facility]: [facility];  Service: Cardiovascular;  Laterality: N/A;   CLIPPING OF ATRIAL APPENDAGE Left 03/06/2016   Procedure: CLIPPING OF LEFT ATRIAL APPENDAGE using a 45 PRO2 AtriClip;  Surgeon: Purcell Nails, MD;  Location: [facility] OR;  Service: Open Heart Surgery;  Laterality: Left;   COLONOSCOPY     EUS N/A 02/20/2016   Procedure: ESOPHAGEAL ENDOSCOPIC ULTRASOUND (EUS) RADIAL;  Surgeon: Willis Modena, MD;  Location: [facility] ENDOS[facility];  Service: Endoscopy;  Laterality: N/A;   EYE MUSCLE SURGERY Bilateral 1970's?   KNEE ARTHROSCOPY Right 04/04/2020   Procedure: RIGHT KNEE ARTHROSCOPY WITH DEBRIDEMENT;  Surgeon: Cammy Copa, MD;  Location: [facility] OR;  Service: Orthopedics;  Laterality: Right;   KNEE ARTHROSCOPY Right 04/10/2021  Procedure: right knee arthroscopy, debridement, placement of antibiotic beads;  Surgeon: Cammy Copa, MD;  Location: [facility] OR;  Service: Orthopedics;  Laterality: Right;   LUMBAR DISC SURGERY  1992   "trimmed bulges off both sides"   MITRAL VALVE REPAIR Right 03/06/2016   Procedure: MINIMALLY INVASIVE MITRAL VALVE REPAIR (MVR) using a 34 Sorin Memo 3D Ring;  Surgeon: Purcell Nails, MD;  Location: [facility] OR;  Service: Open Heart Surgery;  Laterality: Right;   TEE WITHOUT CARDIOVERSION N/A 01/05/2016   Procedure: TRANSESOPHAGEAL ECHOCARDIOGRAM (TEE);  Surgeon: Thurmon Fair, MD;  Location: [facility] ENDOSCOPY;  Service: Cardiovascular;  Laterality: N/A;   TEE WITHOUT CARDIOVERSION N/A 03/06/2016   Procedure: TRANSESOPHAGEAL ECHOCARDIOGRAM (TEE);  Surgeon: Purcell Nails, MD;  Location: [facility] OR;  Service: Open Heart Surgery;  Laterality: N/A;   TUBAL LIGATION     Social History   Occupational History   Not on file  Tobacco Use   Smoking status: Never   Smokeless tobacco: Never  Vaping Use   Vaping Use: Never used  Substance and Sexual Activity   Alcohol use: Yes    Alcohol/week: 10.0 standard drinks    Types: 6 Glasses of wine, 4 Shots of liquor per week    Comment:  occasional   Drug use: No   Sexual activity: Not Currently

## 2021-08-09 ENCOUNTER — Ambulatory Visit (INDEPENDENT_AMBULATORY_CARE_PROVIDER_SITE_OTHER): Payer: Medicare Other | Admitting: Orthopedic Surgery

## 2021-08-09 ENCOUNTER — Other Ambulatory Visit: Payer: Self-pay

## 2021-08-09 ENCOUNTER — Encounter: Payer: Self-pay | Admitting: Orthopedic Surgery

## 2021-08-09 VITALS — Ht 66.0 in | Wt 146.0 lb

## 2021-08-09 DIAGNOSIS — M25461 Effusion, right knee: Secondary | ICD-10-CM | POA: Diagnosis not present

## 2021-08-09 DIAGNOSIS — M1711 Unilateral primary osteoarthritis, right knee: Secondary | ICD-10-CM | POA: Diagnosis not present

## 2021-08-11 ENCOUNTER — Encounter: Payer: Self-pay | Admitting: Orthopedic Surgery

## 2021-08-11 MED ORDER — LIDOCAINE HCL 1 % IJ SOLN
5.0000 mL | INTRAMUSCULAR | Status: AC | PRN
  Administered 2021-08-09: 5 mL

## 2021-08-11 MED ORDER — BUPIVACAINE HCL 0.25 % IJ SOLN
4.0000 mL | INTRAMUSCULAR | Status: AC | PRN
  Administered 2021-08-09: 4 mL via INTRA_ARTICULAR

## 2021-08-11 NOTE — Progress Notes (Signed)
Office Visit Note   Patient: Becky Murphy           Date of Birth: 1951/07/16           MRN: [id number] Visit Date: 08/09/2021 Requested by: Kaleen Mask, MD [address] [place],  [place] [address] PCP: Kaleen Mask, MD  Subjective: Chief Complaint  Patient presents with  . Right Knee - Follow-up  . Left Knee - Follow-up    HPI: Darleene is a 70 year old patient here to review bilateral knee pain.  She has had 2 surgeries for culture-negative infection in the right knee.  She has end-stage arthritis in the right knee as well.  She is having some pain and swelling in the right knee but no definite fevers or chills.  She has also had left knee effusion with aspiration performed twice.  We have tracked her infection parameters and they decreased.  She is doing reasonably well with the left knee at this time.  Denies any other joint complaints.              ROS: All systems reviewed are negative as they relate to the chief complaint within the history of present illness.  Patient denies  fevers or chills.   Assessment & Plan: Visit Diagnoses:  1. Effusion of right knee   2. Unilateral primary osteoarthritis, right knee     Plan: Impression is right knee effusion.  Aspiration performed today.  Injected some Toradol.  Marcaine.  That is for pain relief.  The fluid did have some cloudiness to it.  If she requires further surgery for culture-negative septic arthritis I would favor open surgery with total synovectomy.  Discussed the risk and benefits of knee replacement in this clinical scenario.  I do not think it is a great outcome if she does get infected with a knee replacement.  Particularly cemented knee replacement which is likely what she would require.  We will call her with her lab results.  Follow-Up Instructions: No follow-ups on file.   Orders:  Orders Placed This Encounter  Procedures  . Gram stain  . Anaerobic and Aerobic Culture  . Cell count +  diff,  w/ cryst-synvl fld   No orders of the defined types were placed in this encounter.     Procedures: Large Joint Inj: R knee on 08/09/2021 8:12 PM Indications: diagnostic evaluation, joint swelling and pain Details: 18 G 1.5 in needle, superolateral approach  Arthrogram: No  Medications: 5 mL lidocaine 1 %; 4 mL bupivacaine 0.25 % Aspirate: 20 mL cloudy Outcome: tolerated well, no immediate complications Procedure, treatment alternatives, risks and benefits explained, specific risks discussed. Consent was given by the patient. Immediately prior to procedure a time out was called to verify the correct patient, procedure, equipment, support staff and site/side marked as required. Patient was prepped and draped in the usual sterile fashion.      Clinical Data: No additional findings.  Objective: Vital Signs: Ht 5\' 6"  (1.676 m)   Wt 146 lb (66.2 kg)   BMI 23.57 kg/m   Physical Exam:   Constitutional: Patient appears well-developed HEENT:  Head: Normocephalic Eyes:EOM are normal Neck: Normal range of motion Cardiovascular: Normal rate Pulmonary/chest: Effort normal Neurologic: Patient is alert Skin: Skin is warm Psychiatric: Patient has normal mood and affect   Ortho Exam: Ortho exam demonstrates mild effusion right knee.  Some warmth of the right knee versus left.  No effusion in the left knee.  Right knee  has pretty reasonable range of motion from 5 to 95 degrees.  Collateral and cruciate ligaments are stable.  She is somewhat deconditioned with diminished quad and hamstring strength right leg versus left.  Pedal pulses palpable.  Specialty Comments:  No specialty comments available.  Imaging: No results found.   PMFS History: Patient Active Problem List   Diagnosis Date Noted  . Synovitis of left knee 07/19/2021  . Vancomycin adverse reaction 04/19/2021  . Therapeutic drug monitoring 04/19/2021  . Synovitis of right knee 04/04/2020  . Long term (current)  use of anticoagulants [Z79.01] 03/14/2016  . S/P minimally invasive mitral valve repair 03/06/2016  . Pancreatic mass 01/29/2016  . Thyroid nodule 01/29/2016  . Exertional dyspnea 01/05/2016  . 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 Mitral insufficiency   . Mitral valve prolapse 12/26/2015  . Anxiety 11/02/2015   Past Medical History:  Diagnosis Date  . Anemia    low iron in the past  . Anxiety    pt denies  . Arthritis    "back?" (11/03/2015)  . Chest pain 11/02/2015  . Depression    pt denies  . Exertional dyspnea 01/05/2016  . GERD (gastroesophageal reflux disease)   . Heart murmur   . History of peptic ulcer "late 1970's"  . Migraine    "maybe 3-4/yr now" (11/03/2015)  . 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 Mitral valve prolapse 12/26/2015   symptomatic"MVP surgery postponed to get lesion of pancreas evaluated first".  . Pancreatic mass 01/29/2016   2.8 cm enhancing mass in the pancreatic neck and 3.1 cm low-attenuation cystic lesion in body of pancreas noted on CT angiogram  . PONV (postoperative nausea and vomiting)   . S/P minimally invasive mitral valve repair 03/06/2016   Complex valvuloplasty including triangular resection of posterior leaflet, artificial Gore-tex neochord placement x6 and 34 mm Memo 3D ring annuloplasty via right mini thoracotomy approach with clipping of LA appendage  . Thyroid nodule 01/29/2016   2.4 cm enhancing mixed cystic and solid nodule inferior left thyroid gland noted on CT angiogram    Family History  Problem Relation Age of Onset  . Hypertension Mother   . Cancer Mother 64       MELANOMA  . Cancer Father        PROSTATE  . Cancer Sister 54       BREAST    Past Surgical History:  Procedure Laterality Date  . BACK SURGERY    . BREAST BIOPSY Left ~ 2005  . BREAST LUMPECTOMY Left ~ 2005  . CARDIAC CATHETERIZATION N/A 01/16/2016   Procedure: Right/Left Heart Cath and Coronary Angiography;  Surgeon: Thurmon Fair, MD;  Location: [facility];  Service: Cardiovascular;  Laterality: N/A;  . CLIPPING OF ATRIAL APPENDAGE Left 03/06/2016   Procedure: CLIPPING OF LEFT ATRIAL APPENDAGE using a 45 PRO2 AtriClip;  Surgeon: Purcell Nails, MD;  Location: [facility] OR;  Service: Open Heart Surgery;  Laterality: Left;  . COLONOSCOPY    . EUS N/A 02/20/2016   Procedure: ESOPHAGEAL ENDOSCOPIC ULTRASOUND (EUS) RADIAL;  Surgeon: Willis Modena, MD;  Location: [facility] ENDOSCOPY;  Service: Endoscopy;  Laterality: N/A;  . EYE MUSCLE SURGERY Bilateral 1970's?  Mar[facility] KNEE ARTHROSCOPY Right 04/04/2020   Procedure: RIGHT KNEE ARTHROSCOPY WITH DEBRIDEMENT;  Surgeon: Cammy Copa, MD;  Location: [facility] OR;  Service: Orthopedics;  Laterality: Right;  . KNEE ARTHROSCOPY Right 04/10/2021   Procedure: right knee arthroscopy, debridement, placement of antibiotic beads;  Surgeon: Cammy Copa, MD;  Location: [facility] OR;  Service: Orthopedics;  Laterality: Right;  . LUMBAR DISC SURGERY  1992   "trimmed bulges off both sides"  . MITRAL VALVE REPAIR Right 03/06/2016   Procedure: MINIMALLY INVASIVE MITRAL VALVE REPAIR (MVR) using a 34 Sorin Memo 3D Ring;  Surgeon: Purcell Nails, MD;  Location: [facility] OR;  Service: Open Heart Surgery;  Laterality: Right;  . TEE WITHOUT CARDIOVERSION N/A 01/05/2016   Procedure: TRANSESOPHAGEAL ECHOCARDIOGRAM (TEE);  Surgeon: Thurmon Fair, MD;  Location: [facility] ENDOSCOPY;  Service: Cardiovascular;  Laterality: N/A;  . TEE WITHOUT CARDIOVERSION N/A 03/06/2016   Procedure: TRANSESOPHAGEAL ECHOCARDIOGRAM (TEE);  Surgeon: Purcell Nails, MD;  Location: [facility] OR;  Service: Open Heart Surgery;  Laterality: N/A;  . TUBAL LIGATION     Social History   Occupational History  . Not on file  Tobacco Use  . Smoking status: Never  . Smokeless tobacco: Never  Vaping Use  . Vaping Use: Never used  Substance and Sexual Activity  . Alcohol use: Yes    Alcohol/week: 10.0 standard drinks    Types: 6 Glasses of wine, 4 Shots of  liquor per week    Comment: occasional  . Drug use: No  . Sexual activity: Not Currently

## 2021-08-15 LAB — SYNOVIAL FLUID ANALYSIS, COMPLETE
Basophils, %: 0 %
Eosinophils-Synovial: 0 % (ref 0–2)
Lymphocytes-Synovial Fld: 3 % (ref 0–74)
Monocyte/Macrophage: 2 % (ref 0–69)
Neutrophil, Synovial: 95 % — ABNORMAL HIGH (ref 0–24)
Synoviocytes, %: 0 % (ref 0–15)
WBC, Synovial: 30430 cells/uL — ABNORMAL HIGH (ref <150)

## 2021-08-15 LAB — ANAEROBIC AND AEROBIC CULTURE
AER RESULT:: NO GROWTH
MICRO NUMBER:: 12230728
MICRO NUMBER:: 12230729
SPECIMEN QUALITY:: ADEQUATE
SPECIMEN QUALITY:: ADEQUATE

## 2021-10-31 ENCOUNTER — Other Ambulatory Visit: Payer: Self-pay | Admitting: Surgical

## 2021-11-26 ENCOUNTER — Telehealth: Payer: Self-pay | Admitting: Orthopedic Surgery

## 2021-11-26 NOTE — Telephone Encounter (Signed)
Pt calling stating her left knee is swollen and filled with fluid. Pt currently has an appt on 12/07/21 but wanted to know if she needed to be worked in sooner. The best call back number is [phone].

## 2021-11-28 ENCOUNTER — Encounter: Payer: Self-pay | Admitting: Orthopedic Surgery

## 2021-11-28 ENCOUNTER — Ambulatory Visit (INDEPENDENT_AMBULATORY_CARE_PROVIDER_SITE_OTHER): Payer: Medicare Other | Admitting: Orthopedic Surgery

## 2021-11-28 ENCOUNTER — Other Ambulatory Visit: Payer: Self-pay

## 2021-11-28 DIAGNOSIS — R2242 Localized swelling, mass and lump, left lower limb: Secondary | ICD-10-CM

## 2021-11-28 DIAGNOSIS — M25462 Effusion, left knee: Secondary | ICD-10-CM

## 2021-11-28 NOTE — Progress Notes (Signed)
Office Visit Note   Patient: Becky Murphy           Date of Birth: May 31, 1951           MRN: [id number] Visit Date: 11/28/2021 Requested by: Leonard Downing, MD [address] [place],  [place] [address] PCP: Leonard Downing, MD  Subjective: Chief Complaint  Patient presents with   Other     Knee effusion    HPI: Becky Murphy is a 70 y.o. female who presents to the office complaining of left knee pain.  She has history of septic arthritis requiring arthroscopic I&D twice in the right knee. No left knee surgery. Right knee currently doing well but the left knee has been increasingly painful over the last month. Feels her symptoms and swelling are progressively worsening but denies fevers, chills, night sweats.  She also notes increasing pain more proximal into her thigh but not quite reaching the groin.  This has been more noticeable over the last month and she has not had this type of pain with her previous episodes.  In her previous knee infection episodes infectious disease was consulted and we have never been able to isolate an organism from multiple attempts.  Nonetheless clinical picture has been most clearly associated with diagnosis of infection.              ROS: All systems reviewed are negative as they relate to the chief complaint within the history of present illness.  Patient denies fevers or chills.  Assessment & Plan: Visit Diagnoses:  1. Effusion, left knee   2. Mass of left thigh     Plan: Patient is a 70 year old female who presents complaining of left knee pain.  She has history of septic arthritis requiring arthroscopic debridement and IV antibiotics twice on the right knee but no history of surgery to the left knee for this problem.  No fevers, chills, night sweats but with large effusion and increased warmth, concern for septic arthritis.  Also some concern for extension of infection into the thigh given the diffuse tenderness she is having there  which is new for her.  Plan to obtain ESR, CRP, CBC today.  Left knee aspirated with purulence noted as well as biofilm debris in the tube.  Send this off for synovial fluid analysis and culture.  Ordered MRI left femur stat to evaluate for thigh abscess.  Follow-up after STAT MRI to review results and that the left knee repeat aspiration at that point versus discussion of surgical procedure for the left knee.  The surgical procedure would either be arthroscopic debridement with placement of antibiotic beads which has helped the right knee versus open synovectomy.  Alternatively she has had 1 episode of similar effusion which improved on its own in the right knee without intervention since her 2 surgeries.  Follow-Up Instructions: No follow-ups on file.   Orders:  Orders Placed This Encounter  Procedures   Anaerobic and Aerobic Culture   MR FEMUR LEFT W WO CONTRAST   Sed Rate (ESR)   C-reactive protein   CBC with Differential   Cell count + diff,  w/ cryst-synvl fld   Synovial Fluid Analysis, Complete   No orders of the defined types were placed in this encounter.     Procedures: Large Joint Inj on 11/28/2021 12:20 PM Indications: diagnostic evaluation, joint swelling and pain Details: 18 G 1.5 in needle, superolateral approach  Arthrogram: No  Medications: 5 mL lidocaine 1 % Aspirate:  25 mL purulent and cloudy Outcome: tolerated well, no immediate complications Procedure, treatment alternatives, risks and benefits explained, specific risks discussed. Consent was given by the patient. Immediately prior to procedure a time out was called to verify the correct patient, procedure, equipment, support staff and site/side marked as required. Patient was prepped and draped in the usual sterile fashion.      Clinical Data: No additional findings.  Objective: Vital Signs: There were no vitals taken for this visit.  Physical Exam:  Constitutional: Patient appears well-developed HEENT:   Head: Normocephalic Eyes:EOM are normal Neck: Normal range of motion Cardiovascular: Normal rate Pulmonary/chest: Effort normal Neurologic: Patient is alert Skin: Skin is warm Psychiatric: Patient has normal mood and affect  Ortho Exam: Ortho exam demonstrates left knee with large effusion.  Pain with passive range of motion of the left knee, particularly with extension.  Increased warmth to touch.  Tenderness diffusely throughout the thigh without increased warmth.  No pain with hip range of motion.  Moderate to severe tenderness diffusely throughout the left knee.  Specialty Comments:  No specialty comments available.  Imaging: No results found.   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   Mitral insufficiency    Mitral valve prolapse 12/26/2015   Anxiety 11/02/2015   Past Medical History:  Diagnosis Date   Anemia    low iron in the past   Anxiety    pt denies   Arthritis    "back?" (11/03/2015)   Chest pain 11/02/2015   Depression    pt denies   Exertional dyspnea 01/05/2016   GERD (gastroesophageal reflux disease)    Heart murmur    History of peptic ulcer "late 1970's"   Migraine    "maybe 3-4/yr now" (11/03/2015)   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/2016   Complex valvuloplasty including triangular resection of posterior leaflet, artificial Gore-tex neochord placement x6 and 34 mm Memo 3D ring annuloplasty via right mini thoracotomy approach with clipping of LA appendage   Thyroid nodule 01/29/2016   2.4 cm enhancing mixed cystic and solid nodule inferior left thyroid gland noted on CT angiogram    Family History  Problem Relation Age of Onset   Hypertension Mother    Cancer Mother 25       MELANOMA   Cancer Father        PROSTATE   Cancer Sister 25       BREAST    Past Surgical History:  Procedure Laterality Date   BACK SURGERY     BREAST BIOPSY Left ~ 2005   BREAST LUMPECTOMY Left ~ 2005   CARDIAC CATHETERIZATION N/A 01/16/2016   Procedure: Right/Left Heart Cath and Coronary Angiography;  Surgeon: Sanda Klein, MD;  Location: [facility];  Service: Cardiovascular;  Laterality: N/A;   CLIPPING OF ATRIAL APPENDAGE Left 03/06/2016   Procedure: CLIPPING OF LEFT ATRIAL APPENDAGE using a 74 PRO2 AtriClip;  Surgeon: Rexene Alberts, MD;  Location: [facility];  Service: Open Heart Surgery;  Laterality: Left;   COLONOSCOPY     EUS N/A 02/20/2016   Procedure: ESOPHAGEAL ENDOSCOPIC ULTRASOUND (EUS) RADIAL;  Surgeon: Arta Silence, MD;  Location: [facility];  Service: Endoscopy;  Laterality: N/A;   EYE MUSCLE SURGERY Bilateral 1970's?   KNEE ARTHROSCOPY Right 04/04/2020   Procedure: RIGHT KNEE ARTHROSCOPY WITH DEBRIDEMENT;  Surgeon: Meredith Pel, MD;  Location: [facility];  Service: Orthopedics;  Laterality: Right;   KNEE ARTHROSCOPY Right 04/10/2021   Procedure: right knee arthroscopy, debridement, placement of antibiotic beads;  Surgeon: Meredith Pel, MD;  Location: [facility];  Service: Orthopedics;  Laterality: Right;   [place]   "trimmed bulges off both sides"   MITRAL VALVE REPAIR Right 03/06/2016    Procedure: MINIMALLY INVASIVE MITRAL VALVE REPAIR (MVR) using a 38 Sorin Memo 3D Ring;  Surgeon: Rexene Alberts, MD;  Location: [facility];  Service: Open Heart Surgery;  Laterality: Right;   TEE WITHOUT CARDIOVERSION N/A 01/05/2016   Procedure: TRANSESOPHAGEAL ECHOCARDIOGRAM (TEE);  Surgeon: Sanda Klein, MD;  Location: [facility];  Service: Cardiovascular;  Laterality: N/A;   TEE WITHOUT CARDIOVERSION N/A 03/06/2016   Procedure: TRANSESOPHAGEAL ECHOCARDIOGRAM (TEE);  Surgeon: Rexene Alberts, MD;  Location: [facility];  Service: Open Heart Surgery;  Laterality: N/A;   TUBAL LIGATION     Social History   Occupational History   Not on file  Tobacco Use   Smoking status: Never   Smokeless tobacco: Never  Vaping Use   Vaping Use: Never used  Substance and Sexual Activity   Alcohol use: Yes    Alcohol/week: 10.0 standard drinks    Types: 6 Glasses of wine, 4 Shots of liquor per week    Comment: occasional   Drug use: No   Sexual activity: Not Currently

## 2021-11-30 ENCOUNTER — Ambulatory Visit
Admission: RE | Admit: 2021-11-30 | Discharge: 2021-11-30 | Disposition: A | Discharge status: Home / Self Care | Payer: Medicare Other | Source: Ambulatory Visit | Attending: Surgical | Admitting: Surgical

## 2021-11-30 DIAGNOSIS — R2242 Localized swelling, mass and lump, left lower limb: Secondary | ICD-10-CM

## 2021-11-30 MED ORDER — LIDOCAINE HCL 1 % IJ SOLN
5.0000 mL | INTRAMUSCULAR | Status: AC | PRN
  Administered 2021-11-28: 5 mL

## 2021-11-30 MED ORDER — GADOBENATE DIMEGLUMINE 529 MG/ML IV SOLN
14.0000 mL | Freq: Once | INTRAVENOUS | Status: AC | PRN
  Administered 2021-11-30: 14 mL via INTRAVENOUS

## 2021-12-04 LAB — ANAEROBIC AND AEROBIC CULTURE
AER RESULT:: NO GROWTH
MICRO NUMBER:: 12696132
MICRO NUMBER:: 12696133
SPECIMEN QUALITY:: ADEQUATE
SPECIMEN QUALITY:: ADEQUATE

## 2021-12-04 LAB — CBC WITH DIFFERENTIAL/PLATELET
Absolute Monocytes: 616 cells/uL (ref 200–950)
Basophils Absolute: 79 cells/uL (ref 0–200)
Basophils Relative: 1 %
Eosinophils Absolute: 237 cells/uL (ref 15–500)
Eosinophils Relative: 3 %
HCT: 41.5 % (ref 35.0–45.0)
Hemoglobin: 13.8 g/dL (ref 11.7–15.5)
Lymphs Abs: 1485 cells/uL (ref 850–3900)
MCH: 29.2 pg (ref 27.0–33.0)
MCHC: 33.3 g/dL (ref 32.0–36.0)
MCV: 87.9 fL (ref 80.0–100.0)
MPV: 10.7 fL (ref 7.5–12.5)
Monocytes Relative: 7.8 %
Neutro Abs: 5483 cells/uL (ref 1500–7800)
Neutrophils Relative %: 69.4 %
Platelets: 362 10*3/uL (ref 140–400)
RBC: 4.72 10*6/uL (ref 3.80–5.10)
RDW: 14.5 % (ref 11.0–15.0)
Total Lymphocyte: 18.8 %
WBC: 7.9 10*3/uL (ref 3.8–10.8)

## 2021-12-04 LAB — SYNOVIAL FLUID ANALYSIS, COMPLETE
Basophils, %: 0 %
Eosinophils-Synovial: 0 % (ref 0–2)
Lymphocytes-Synovial Fld: 24 % (ref 0–74)
Monocyte/Macrophage: 18 % (ref 0–69)
Neutrophil, Synovial: 58 % — ABNORMAL HIGH (ref 0–24)
Synoviocytes, %: 0 % (ref 0–15)
WBC, Synovial: 42650 cells/uL — ABNORMAL HIGH (ref <150)

## 2021-12-04 LAB — C-REACTIVE PROTEIN: CRP: 58.4 mg/L — ABNORMAL HIGH (ref <8.0)

## 2021-12-04 LAB — SEDIMENTATION RATE: Sed Rate: 31 mm/h — ABNORMAL HIGH (ref 0–30)

## 2021-12-04 NOTE — Progress Notes (Signed)
Here is lab and culture you asked for

## 2021-12-04 NOTE — Progress Notes (Signed)
Can you figure out where her lab and culture data is thx

## 2021-12-05 ENCOUNTER — Telehealth: Payer: Self-pay | Admitting: Orthopedic Surgery

## 2021-12-05 NOTE — Progress Notes (Signed)
Hey this is the persn im currently chatting with you about

## 2021-12-05 NOTE — Progress Notes (Signed)
done

## 2021-12-07 ENCOUNTER — Encounter: Payer: Self-pay | Admitting: Orthopedic Surgery

## 2021-12-07 ENCOUNTER — Ambulatory Visit: Payer: Medicare Other | Admitting: Orthopedic Surgery

## 2021-12-07 ENCOUNTER — Ambulatory Visit (INDEPENDENT_AMBULATORY_CARE_PROVIDER_SITE_OTHER): Payer: Medicare Other | Admitting: Orthopedic Surgery

## 2021-12-07 ENCOUNTER — Encounter (HOSPITAL_COMMUNITY): Payer: Self-pay | Admitting: Orthopedic Surgery

## 2021-12-07 ENCOUNTER — Other Ambulatory Visit: Payer: Self-pay

## 2021-12-07 DIAGNOSIS — M25462 Effusion, left knee: Secondary | ICD-10-CM

## 2021-12-07 NOTE — Progress Notes (Signed)
Office Visit Note   Patient: Becky Murphy           Date of Birth: [birth year]           MRN: [id number] Visit Date: 12/07/2021 Requested by: Kaleen Mask, MD [address] [place],  [place] [address] PCP: Kaleen Mask, MD  Subjective: Chief Complaint  Patient presents with   Left Knee - Follow-up    HPI: Malissie is a 70 year old patient with left knee pain.  Seen last week and had the left knee aspirated.  Cell count came back on that around 40,000 with slightly more than half neutrophils.  No growth.  No organisms on gram stain.  No fevers or chills but she has had worsening pain in the aspiration attempt did diminish her ambulatory ability for several days.  She does not wish to repeat that.  Takes ibuprofen at night.  She feels like the fluid has returned.  MRI scan shows significant synovial enhancement in fluid within the suprapatellar pouch and around the knee.              ROS: All systems reviewed are negative as they relate to the chief complaint within the history of present illness.  Patient denies  fevers or chills.   Assessment & Plan: Visit Diagnoses:  1. Effusion, left knee     Plan: Impression is left knee effusion very similar to what she had clinically on the right-hand side.  This did require eventual arthroscopic washout and stimulant bead placement with antibiotics.  No organism really ever grew from this.  I think the same process is going on in the left knee with some type of indolent infection.  Discussed with Xiana repeat aspiration versus observation versus surgical intervention.  Based on the significant amount of recurrent effusion as well as her history with the right knee she would like to proceed with arthroscopic debridement synovectomy and stimulant bead placement.  Instead of IV antibiotics we will try to find an oral antibiotic that she can take for 6 weeks in order to assist with process eradication.  Risk and benefits of surgery  are discussed including not limited to incomplete pain relief incomplete restoration of function potential need for surgery.  Patient understands the risk and benefits.  Anticipate similar timeline of recovery that she had on the right knee.  All questions answered.  Follow-Up Instructions: No follow-ups on file.   Orders:  No orders of the defined types were placed in this encounter.  No orders of the defined types were placed in this encounter.     Procedures: No procedures performed   Clinical Data: No additional findings.  Objective: Vital Signs: There were no vitals taken for this visit.  Physical Exam:   Constitutional: Patient appears well-developed HEENT:  Head: Normocephalic Eyes:EOM are normal Neck: Normal range of motion Cardiovascular: Normal rate Pulmonary/chest: Effort normal Neurologic: Patient is alert Skin: Skin is warm Psychiatric: Patient has normal mood and affect   Ortho Exam: Ortho exam demonstrates large effusion in the left knee.  No proximal lymphadenopathy.  Pedal pulses palpable.  Left knee is warm.  Right knee has good range of motion with no effusion.  Collateral crucial ligaments are stable.  Extensor mechanism is intact.  No masses lymphadenopathy or skin changes noted around that left knee region.  It is painful for her to bend and extend the knee.  Plan to use TXA at the time of surgery.  Specialty Comments:  No  specialty comments available.  Imaging: No results found.   PMFS History: Patient Active Problem List   Diagnosis Date Noted   Synovitis of left knee 07/19/2021   Vancomycin adverse reaction 04/19/2021   Therapeutic drug monitoring 04/19/2021   Synovitis of right knee 04/04/2020   Long term (current) use of anticoagulants [Z79.01] 03/14/2016   S/P minimally invasive mitral valve repair 03/06/2016   Pancreatic mass 01/29/2016   Thyroid nodule 01/29/2016   Exertional dyspnea 01/05/2016   Mitral regurgitation due to cusp  prolapse 01/05/2016   Mitral insufficiency    Mitral valve prolapse 12/26/2015   Anxiety 11/02/2015   Past Medical History:  Diagnosis Date   Anemia    low iron in the past   Anxiety    pt denies   Arthritis    "back?" (11/03/2015)   Chest pain 11/02/2015   Depression    pt denies   Exertional dyspnea 01/05/2016   GERD (gastroesophageal reflux disease)    Heart murmur    History of peptic ulcer "late 1970's"   Migraine    "maybe 3-4/yr now" (11/03/2015)   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   Complex valvuloplasty including triangular resection of posterior leaflet, artificial Gore-tex neochord placement x6 and 34 mm Memo 3D ring annuloplasty via right mini thoracotomy approach with clipping of LA appendage   Thyroid nodule 01/29/2016   2.4 cm enhancing mixed cystic and solid nodule inferior left thyroid gland noted on CT angiogram    Family History  Problem Relation Age of Onset   Hypertension Mother    Cancer Mother 13       MELANOMA   Cancer Father        PROSTATE   Cancer Sister 67       BREAST    Past Surgical History:  Procedure Laterality Date   BACK SURGERY     BREAST BIOPSY Left ~ 2005   BREAST LUMPECTOMY Left ~ 2005   CARDIAC CATHETERIZATION N/A 01/16/2016   Procedure: Right/Left Heart Cath and Coronary Angiography;  Surgeon: Thurmon Fair, MD;  Location: [facility];  Service: Cardiovascular;  Laterality: N/A;   CLIPPING OF ATRIAL APPENDAGE Left 03/06/2016   Procedure: CLIPPING OF LEFT ATRIAL APPENDAGE using a 45 PRO2 AtriClip;  Surgeon: Purcell Nails, MD;  Location:  [facility];  Service: Open Heart Surgery;  Laterality: Left;   COLONOSCOPY     EUS N/A 02/20/2016   Procedure: ESOPHAGEAL ENDOSCOPIC ULTRASOUND (EUS) RADIAL;  Surgeon: Willis Modena, MD;  Location: [facility];  Service: Endoscopy;  Laterality: N/A;   EYE MUSCLE SURGERY Bilateral 1970's?   KNEE ARTHROSCOPY Right 04/04/2020   Procedure: RIGHT KNEE ARTHROSCOPY WITH DEBRIDEMENT;  Surgeon: Cammy Copa, MD;  Location: [facility];  Service: Orthopedics;  Laterality: Right;   KNEE ARTHROSCOPY Right 04/10/2021   Procedure: right knee arthroscopy, debridement, placement of antibiotic beads;  Surgeon: Cammy Copa, MD;  [facility]: [facility];  Service: Orthopedics;  Laterality: Right;   LUMBAR DISC SURGERY  1992   "trimmed bulges off both sides"   MITRAL VALVE REPAIR Right 03/06/2016   Procedure: MINIMALLY INVASIVE  MITRAL VALVE REPAIR (MVR) using a 34 Sorin Memo 3D Ring;  Surgeon: Purcell Nails, MD;  Location: [facility];  Service: Open Heart Surgery;  Laterality: Right;   TEE WITHOUT CARDIOVERSION N/A 01/05/2016   Procedure: TRANSESOPHAGEAL ECHOCARDIOGRAM (TEE);  Surgeon: Thurmon Fair, MD;  Location: [facility];  Service: Cardiovascular;  Laterality: N/A;   TEE WITHOUT CARDIOVERSION N/A 03/06/2016   Procedure: TRANSESOPHAGEAL ECHOCARDIOGRAM (TEE);  Surgeon: Purcell Nails, MD;  Location: [facility];  Service: Open Heart Surgery;  Laterality: N/A;   TUBAL LIGATION     Social History   Occupational History   Not on file  Tobacco Use   Smoking status: Never   Smokeless tobacco: Never  Vaping Use   Vaping Use: Never used  Substance and Sexual Activity   Alcohol use: Yes    Alcohol/week: 10.0 standard drinks    Types: 6 Glasses of wine, 4 Shots of liquor per week    Comment: occasional   Drug use: No   Sexual activity: Not Currently    Birth control/protection: Post-menopausal

## 2021-12-07 NOTE — Progress Notes (Signed)
DUE TO COVID-19 ONLY ONE VISITOR IS ALLOWED TO COME WITH YOU AND STAY IN THE WAITING ROOM ONLY DURING PRE OP AND PROCEDURE DAY OF SURGERY.   Two VISITORS MAY VISIT WITH YOU AFTER SURGERY IN YOUR PRIVATE ROOM DURING VISITING HOURS ONLY!  PCP - Dr Windle Guard Cardiologist - n/a Infectious DIseases - Dr Cliffton Asters  Chest x-ray - n/a EKG - n/a Stress Test - n/a ECHO - 07/05/16 Cardiac Cath - 01/16/16  ICD Pacemaker/Loop - n/a  Sleep Study -  n/a CPAP - none  Aspirin Instructions: Follow your surgeon's instructions on when to stop aspirin prior to surgery,  If no instructions were given by your surgeon then you will need to call the office for those instructions.  Anesthesia review: Yes  STOP now taking any Aspirin (unless otherwise instructed by your surgeon), Aleve, Naproxen, Ibuprofen, Motrin, Advil, Goody's, BC's, all herbal medications, fish oil, and all vitamins.   Coronavirus Screening Covid test is scheduled on DOS Do you have any of the following symptoms:  Cough yes/no: No Fever (>100.29F)  yes/no: No Runny nose yes/no: No Sore throat yes/no: No Difficulty breathing/shortness of breath  yes/no: No  Have you traveled in the last 14 days and where? yes/no: No  Patient verbalized understanding of instructions that were given via phone.

## 2021-12-10 ENCOUNTER — Inpatient Hospital Stay (HOSPITAL_COMMUNITY): Payer: Medicare Other | Admitting: Anesthesiology

## 2021-12-10 ENCOUNTER — Other Ambulatory Visit: Payer: Self-pay

## 2021-12-10 ENCOUNTER — Inpatient Hospital Stay (HOSPITAL_COMMUNITY)
Admission: RE | Admit: 2021-12-10 | Discharge: 2021-12-12 | DRG: 485 | Disposition: A | Discharge status: Home Health | Payer: Medicare Other | Attending: Orthopedic Surgery | Admitting: Orthopedic Surgery

## 2021-12-10 ENCOUNTER — Encounter (HOSPITAL_COMMUNITY): Payer: Self-pay | Admitting: Orthopedic Surgery

## 2021-12-10 ENCOUNTER — Encounter (HOSPITAL_COMMUNITY)
Admission: RE | Disposition: A | Discharge status: Home Health | Payer: Self-pay | Source: Home / Self Care | Attending: Orthopedic Surgery

## 2021-12-10 DIAGNOSIS — M659 Synovitis and tenosynovitis, unspecified: Secondary | ICD-10-CM

## 2021-12-10 DIAGNOSIS — Z79899 Other long term (current) drug therapy: Secondary | ICD-10-CM

## 2021-12-10 DIAGNOSIS — Z8249 Family history of ischemic heart disease and other diseases of the circulatory system: Secondary | ICD-10-CM

## 2021-12-10 DIAGNOSIS — Z881 Allergy status to other antibiotic agents status: Secondary | ICD-10-CM | POA: Diagnosis not present

## 2021-12-10 DIAGNOSIS — Z01818 Encounter for other preprocedural examination: Secondary | ICD-10-CM

## 2021-12-10 DIAGNOSIS — M00862 Arthritis due to other bacteria, left knee: Principal | ICD-10-CM | POA: Diagnosis present

## 2021-12-10 DIAGNOSIS — U071 COVID-19: Secondary | ICD-10-CM | POA: Diagnosis present

## 2021-12-10 DIAGNOSIS — M009 Pyogenic arthritis, unspecified: Secondary | ICD-10-CM | POA: Diagnosis present

## 2021-12-10 DIAGNOSIS — M65162 Other infective (teno)synovitis, left knee: Secondary | ICD-10-CM | POA: Diagnosis present

## 2021-12-10 DIAGNOSIS — K219 Gastro-esophageal reflux disease without esophagitis: Secondary | ICD-10-CM | POA: Diagnosis present

## 2021-12-10 DIAGNOSIS — Z7982 Long term (current) use of aspirin: Secondary | ICD-10-CM

## 2021-12-10 HISTORY — PX: KNEE ARTHROSCOPY: SHX127

## 2021-12-10 LAB — BASIC METABOLIC PANEL
Anion gap: 9 (ref 5–15)
BUN: 11 mg/dL (ref 8–23)
CO2: 24 mmol/L (ref 22–32)
Calcium: 8.7 mg/dL — ABNORMAL LOW (ref 8.9–10.3)
Chloride: 106 mmol/L (ref 98–111)
Creatinine, Ser: 0.64 mg/dL (ref 0.44–1.00)
GFR, Estimated: >60 mL/min (ref >60)
Glucose, Bld: 108 mg/dL — ABNORMAL HIGH (ref 70–99)
Potassium: 3.4 mmol/L — ABNORMAL LOW (ref 3.5–5.1)
Sodium: 139 mmol/L (ref 135–145)

## 2021-12-10 LAB — SARS CORONAVIRUS 2 BY RT PCR (HOSPITAL ORDER, PERFORMED IN ~~LOC~~ HOSPITAL LAB): SARS Coronavirus 2: POSITIVE — AB

## 2021-12-10 SURGERY — ARTHROSCOPY, KNEE
Anesthesia: General | Site: Knee | Laterality: Left

## 2021-12-10 MED ORDER — FENTANYL CITRATE (PF) 100 MCG/2ML IJ SOLN
INTRAMUSCULAR | Status: DC | PRN
  Administered 2021-12-10 (×2): 100 ug via INTRAVENOUS
  Administered 2021-12-10: 50 ug via INTRAVENOUS

## 2021-12-10 MED ORDER — OXYCODONE HCL 5 MG PO TABS: 5.0000 mg | ORAL_TABLET | Freq: Once | ORAL | Status: DC | PRN

## 2021-12-10 MED ORDER — OXYCODONE HCL 5 MG/5ML PO SOLN: 5.0000 mg | Freq: Once | ORAL | Status: DC | PRN

## 2021-12-10 MED ORDER — GENTAMICIN SULFATE 40 MG/ML IJ SOLN
INTRAMUSCULAR | Status: AC
  Filled 2021-12-10: qty 6

## 2021-12-10 MED ORDER — GABAPENTIN 300 MG PO CAPS
900.0000 mg | ORAL_CAPSULE | Freq: Every day | ORAL | Status: DC
  Administered 2021-12-10 – 2021-12-11 (×2): 900 mg via ORAL
  Filled 2021-12-10 (×2): qty 3

## 2021-12-10 MED ORDER — FENTANYL CITRATE (PF) 100 MCG/2ML IJ SOLN
INTRAMUSCULAR | Status: AC
  Filled 2021-12-10: qty 2

## 2021-12-10 MED ORDER — MORPHINE SULFATE (PF) 4 MG/ML IV SOLN
INTRAVENOUS | Status: AC
  Filled 2021-12-10: qty 2

## 2021-12-10 MED ORDER — ORAL CARE MOUTH RINSE: 15.0000 mL | Freq: Once | OROMUCOSAL | Status: AC

## 2021-12-10 MED ORDER — PHENYLEPHRINE HCL-NACL 20-0.9 MG/250ML-% IV SOLN
INTRAVENOUS | Status: DC | PRN
  Administered 2021-12-10: 25 ug/min via INTRAVENOUS

## 2021-12-10 MED ORDER — SODIUM CHLORIDE 0.9 % IR SOLN
Status: DC | PRN
  Administered 2021-12-10: 1000 mL

## 2021-12-10 MED ORDER — METOCLOPRAMIDE HCL 5 MG/ML IJ SOLN: 5.0000 mg | Freq: Three times a day (TID) | INTRAMUSCULAR | Status: DC | PRN

## 2021-12-10 MED ORDER — METHOCARBAMOL 1000 MG/10ML IJ SOLN
500.0000 mg | Freq: Four times a day (QID) | INTRAVENOUS | Status: DC | PRN
  Filled 2021-12-10: qty 5

## 2021-12-10 MED ORDER — EPINEPHRINE PF 1 MG/ML IJ SOLN
INTRAMUSCULAR | Status: AC
  Filled 2021-12-10: qty 4

## 2021-12-10 MED ORDER — ACETAMINOPHEN 325 MG PO TABS: 325.0000 mg | ORAL_TABLET | Freq: Four times a day (QID) | ORAL | Status: DC | PRN

## 2021-12-10 MED ORDER — DIPHENHYDRAMINE HCL 25 MG PO CAPS: 25.0000 mg | ORAL_CAPSULE | Freq: Every day | ORAL | Status: DC | PRN

## 2021-12-10 MED ORDER — TRANEXAMIC ACID-NACL 1000-0.7 MG/100ML-% IV SOLN
INTRAVENOUS | Status: AC
  Filled 2021-12-10: qty 100

## 2021-12-10 MED ORDER — LACTATED RINGERS IV SOLN: INTRAVENOUS | Status: AC

## 2021-12-10 MED ORDER — FENTANYL CITRATE (PF) 250 MCG/5ML IJ SOLN
INTRAMUSCULAR | Status: AC
  Filled 2021-12-10: qty 5

## 2021-12-10 MED ORDER — METOCLOPRAMIDE HCL 5 MG PO TABS: 5.0000 mg | ORAL_TABLET | Freq: Three times a day (TID) | ORAL | Status: DC | PRN

## 2021-12-10 MED ORDER — ROCURONIUM BROMIDE 100 MG/10ML IV SOLN
INTRAVENOUS | Status: DC | PRN
  Administered 2021-12-10: 40 mg via INTRAVENOUS

## 2021-12-10 MED ORDER — ACETAMINOPHEN 500 MG PO TABS
500.0000 mg | ORAL_TABLET | Freq: Four times a day (QID) | ORAL | Status: AC
  Administered 2021-12-10 – 2021-12-11 (×4): 500 mg via ORAL
  Filled 2021-12-10 (×4): qty 1

## 2021-12-10 MED ORDER — CEFAZOLIN SODIUM-DEXTROSE 2-3 GM-%(50ML) IV SOLR
INTRAVENOUS | Status: DC | PRN
  Administered 2021-12-10: 2 g via INTRAVENOUS

## 2021-12-10 MED ORDER — CLONIDINE HCL (ANALGESIA) 100 MCG/ML EP SOLN
EPIDURAL | Status: AC
  Filled 2021-12-10: qty 10

## 2021-12-10 MED ORDER — MORPHINE SULFATE 4 MG/ML IJ SOLN
INTRAMUSCULAR | Status: DC | PRN
  Administered 2021-12-10: 8 mg

## 2021-12-10 MED ORDER — MIDAZOLAM HCL 5 MG/5ML IJ SOLN
INTRAMUSCULAR | Status: DC | PRN
  Administered 2021-12-10: 2 mg via INTRAVENOUS

## 2021-12-10 MED ORDER — SODIUM CHLORIDE 0.9 % IV SOLN
1.5000 g | INTRAVENOUS | Status: DC
  Filled 2021-12-10: qty 1.5

## 2021-12-10 MED ORDER — POVIDONE-IODINE 10 % EX SWAB
2.0000 "application " | Freq: Once | CUTANEOUS | Status: AC
  Administered 2021-12-10: 2 via TOPICAL

## 2021-12-10 MED ORDER — CEFAZOLIN SODIUM-DEXTROSE 2-4 GM/100ML-% IV SOLN
INTRAVENOUS | Status: AC
  Filled 2021-12-10: qty 100

## 2021-12-10 MED ORDER — SUGAMMADEX SODIUM 200 MG/2ML IV SOLN
INTRAVENOUS | Status: DC | PRN
  Administered 2021-12-10: 200 mg via INTRAVENOUS

## 2021-12-10 MED ORDER — CEFAZOLIN SODIUM-DEXTROSE 2-4 GM/100ML-% IV SOLN: 2.0000 g | INTRAVENOUS | Status: DC

## 2021-12-10 MED ORDER — PROPOFOL 10 MG/ML IV BOLUS
INTRAVENOUS | Status: AC
  Filled 2021-12-10: qty 20

## 2021-12-10 MED ORDER — CHLORHEXIDINE GLUCONATE 0.12 % MT SOLN
OROMUCOSAL | Status: AC
  Administered 2021-12-10: 15 mL via OROMUCOSAL
  Filled 2021-12-10: qty 15

## 2021-12-10 MED ORDER — BUPIVACAINE HCL (PF) 0.25 % IJ SOLN
INTRAMUSCULAR | Status: AC
  Filled 2021-12-10: qty 30

## 2021-12-10 MED ORDER — SODIUM CHLORIDE 0.9 % IR SOLN
Status: DC | PRN
  Administered 2021-12-10 (×4): 1 mL

## 2021-12-10 MED ORDER — ONDANSETRON HCL 4 MG/2ML IJ SOLN: 4.0000 mg | Freq: Four times a day (QID) | INTRAMUSCULAR | Status: DC | PRN

## 2021-12-10 MED ORDER — LACTATED RINGERS IV SOLN: INTRAVENOUS | Status: DC

## 2021-12-10 MED ORDER — VANCOMYCIN HCL 500 MG IV SOLR
INTRAVENOUS | Status: AC
  Filled 2021-12-10: qty 10

## 2021-12-10 MED ORDER — ASPIRIN EC 325 MG PO TBEC: 325.0000 mg | DELAYED_RELEASE_TABLET | Freq: Every day | ORAL | Status: DC | PRN

## 2021-12-10 MED ORDER — DOCUSATE SODIUM 100 MG PO CAPS
100.0000 mg | ORAL_CAPSULE | Freq: Two times a day (BID) | ORAL | Status: DC
  Administered 2021-12-10 – 2021-12-12 (×4): 100 mg via ORAL
  Filled 2021-12-10 (×5): qty 1

## 2021-12-10 MED ORDER — VANCOMYCIN HCL 1000 MG IV SOLR
INTRAVENOUS | Status: AC
  Filled 2021-12-10: qty 20

## 2021-12-10 MED ORDER — SUCCINYLCHOLINE CHLORIDE 200 MG/10ML IV SOSY
PREFILLED_SYRINGE | INTRAVENOUS | Status: DC | PRN
  Administered 2021-12-10: 100 mg via INTRAVENOUS

## 2021-12-10 MED ORDER — POVIDONE-IODINE 7.5 % EX SOLN: Freq: Once | CUTANEOUS | Status: DC

## 2021-12-10 MED ORDER — SODIUM CHLORIDE 0.9 % IV SOLN
500.0000 mg | Freq: Every day | INTRAVENOUS | Status: DC
  Administered 2021-12-10 – 2021-12-12 (×3): 500 mg via INTRAVENOUS
  Filled 2021-12-10 (×3): qty 10

## 2021-12-10 MED ORDER — FENTANYL CITRATE (PF) 100 MCG/2ML IJ SOLN: 25.0000 ug | INTRAMUSCULAR | Status: DC | PRN

## 2021-12-10 MED ORDER — SODIUM CHLORIDE 0.9 % IV SOLN
2.0000 g | INTRAVENOUS | Status: DC
  Administered 2021-12-10 – 2021-12-12 (×3): 2 g via INTRAVENOUS
  Filled 2021-12-10 (×3): qty 20

## 2021-12-10 MED ORDER — CEFAZOLIN SODIUM-DEXTROSE 1-4 GM/50ML-% IV SOLN
1.0000 g | Freq: Three times a day (TID) | INTRAVENOUS | Status: DC
  Administered 2021-12-10 – 2021-12-11 (×2): 1 g via INTRAVENOUS
  Filled 2021-12-10 (×3): qty 50

## 2021-12-10 MED ORDER — VANCOMYCIN HCL 500 MG IV SOLR
INTRAVENOUS | Status: DC | PRN
  Administered 2021-12-10: 500 mg

## 2021-12-10 MED ORDER — HYDROCODONE-ACETAMINOPHEN 5-325 MG PO TABS
1.0000 | ORAL_TABLET | ORAL | Status: DC | PRN
  Administered 2021-12-11 – 2021-12-12 (×7): 2 via ORAL
  Filled 2021-12-10 (×7): qty 2

## 2021-12-10 MED ORDER — BUPIVACAINE HCL (PF) 0.25 % IJ SOLN
INTRAMUSCULAR | Status: DC | PRN
  Administered 2021-12-10: 30 mL

## 2021-12-10 MED ORDER — TRANEXAMIC ACID 1000 MG/10ML IV SOLN
INTRAVENOUS | Status: DC | PRN
  Administered 2021-12-10: 1000 mg via INTRAVENOUS

## 2021-12-10 MED ORDER — METHOCARBAMOL 500 MG PO TABS: 500.0000 mg | ORAL_TABLET | Freq: Four times a day (QID) | ORAL | Status: DC | PRN

## 2021-12-10 MED ORDER — ONDANSETRON HCL 4 MG/2ML IJ SOLN
INTRAMUSCULAR | Status: DC | PRN
  Administered 2021-12-10: 4 mg via INTRAVENOUS

## 2021-12-10 MED ORDER — DEXAMETHASONE SODIUM PHOSPHATE 4 MG/ML IJ SOLN
INTRAMUSCULAR | Status: DC | PRN
  Administered 2021-12-10: 10 mg via INTRAVENOUS

## 2021-12-10 MED ORDER — GENTAMICIN SULFATE 40 MG/ML IJ SOLN
INTRAMUSCULAR | Status: DC | PRN
  Administered 2021-12-10: 240 mg

## 2021-12-10 MED ORDER — CHLORHEXIDINE GLUCONATE 0.12 % MT SOLN: 15.0000 mL | Freq: Once | OROMUCOSAL | Status: AC

## 2021-12-10 MED ORDER — MIDAZOLAM HCL 2 MG/2ML IJ SOLN
INTRAMUSCULAR | Status: AC
  Filled 2021-12-10: qty 2

## 2021-12-10 MED ORDER — ONDANSETRON HCL 4 MG PO TABS: 4.0000 mg | ORAL_TABLET | Freq: Four times a day (QID) | ORAL | Status: DC | PRN

## 2021-12-10 MED ORDER — MORPHINE SULFATE (PF) 2 MG/ML IV SOLN
0.5000 mg | INTRAVENOUS | Status: DC | PRN
  Administered 2021-12-10 – 2021-12-11 (×4): 1 mg via INTRAVENOUS
  Filled 2021-12-10 (×4): qty 1

## 2021-12-10 MED ORDER — LACTATED RINGERS IV SOLN: INTRAVENOUS | Status: DC | PRN

## 2021-12-10 MED ORDER — PROPOFOL 10 MG/ML IV BOLUS
INTRAVENOUS | Status: DC | PRN
  Administered 2021-12-10: 140 mg via INTRAVENOUS

## 2021-12-10 MED ORDER — CLONIDINE HCL (ANALGESIA) 100 MCG/ML EP SOLN
EPIDURAL | Status: DC | PRN
  Administered 2021-12-10: 1 mL

## 2021-12-10 SURGICAL SUPPLY — 58 items
BAG COUNTER SPONGE SURGICOUNT (BAG) ×1 IMPLANT
BANDAGE ESMARK 6X9 LF (GAUZE/BANDAGES/DRESSINGS) IMPLANT
BLADE CLIPPER SURG (BLADE) IMPLANT
BLADE EXCALIBUR 4.0X13 (MISCELLANEOUS) ×2 IMPLANT
BLADE SURG 15 STRL LF DISP TIS (BLADE) IMPLANT
BLADE SURG 15 STRL SS (BLADE) ×2
BNDG ELASTIC 6X10 VLCR STRL LF (GAUZE/BANDAGES/DRESSINGS) ×1 IMPLANT
BNDG ESMARK 6X9 LF (GAUZE/BANDAGES/DRESSINGS) ×2
COVER SURGICAL LIGHT HANDLE (MISCELLANEOUS) ×2 IMPLANT
CUFF TOURN SGL QUICK 34 (TOURNIQUET CUFF) ×2
CUFF TOURN SGL QUICK 42 (TOURNIQUET CUFF) IMPLANT
CUFF TRNQT CYL 34X4.125X (TOURNIQUET CUFF) IMPLANT
DRAPE ARTHROSCOPY W/POUCH 114 (DRAPES) ×2 IMPLANT
DRAPE U-SHAPE 47X51 STRL (DRAPES) ×2 IMPLANT
DRSG TEGADERM 4X4.75 (GAUZE/BANDAGES/DRESSINGS) ×5 IMPLANT
DRSG XEROFORM 1X8 (GAUZE/BANDAGES/DRESSINGS) ×1 IMPLANT
DURAPREP 26ML APPLICATOR (WOUND CARE) ×3 IMPLANT
DW OUTFLOW CASSETTE/TUBE SET (MISCELLANEOUS) ×2 IMPLANT
EVACUATOR 1/8 PVC DRAIN (DRAIN) ×1 IMPLANT
GAUZE SPONGE 4X4 12PLY STRL (GAUZE/BANDAGES/DRESSINGS) ×1 IMPLANT
GAUZE XEROFORM 1X8 LF (GAUZE/BANDAGES/DRESSINGS) IMPLANT
GLOVE SRG 8 PF TXTR STRL LF DI (GLOVE) ×1 IMPLANT
GLOVE SURG LTX SZ8 (GLOVE) ×2 IMPLANT
GLOVE SURG UNDER POLY LF SZ8 (GLOVE) ×2
GOWN STRL REUS W/ TWL LRG LVL3 (GOWN DISPOSABLE) ×2 IMPLANT
GOWN STRL REUS W/ TWL XL LVL3 (GOWN DISPOSABLE) ×1 IMPLANT
GOWN STRL REUS W/TWL LRG LVL3 (GOWN DISPOSABLE) ×4
GOWN STRL REUS W/TWL XL LVL3 (GOWN DISPOSABLE) ×2
IMMOBILIZER KNEE 22 UNIV (SOFTGOODS) ×1 IMPLANT
KIT BASIN OR (CUSTOM PROCEDURE TRAY) ×2 IMPLANT
KIT STIMULAN RAPID CURE  10CC (Orthopedic Implant) ×2 IMPLANT
KIT STIMULAN RAPID CURE 10CC (Orthopedic Implant) IMPLANT
KIT TURNOVER KIT B (KITS) ×2 IMPLANT
MANIFOLD NEPTUNE II (INSTRUMENTS) ×1 IMPLANT
NDL 18GX1X1/2 (RX/OR ONLY) (NEEDLE) IMPLANT
NDL HYPO 25GX1X1/2 BEV (NEEDLE) ×1 IMPLANT
NEEDLE 18GX1X1/2 (RX/OR ONLY) (NEEDLE) IMPLANT
NEEDLE HYPO 25GX1X1/2 BEV (NEEDLE) ×2 IMPLANT
NS IRRIG 1000ML POUR BTL (IV SOLUTION) IMPLANT
PACK ARTHROSCOPY DSU (CUSTOM PROCEDURE TRAY) ×2 IMPLANT
PAD ARMBOARD 7.5X6 YLW CONV (MISCELLANEOUS) ×4 IMPLANT
PADDING CAST COTTON 6X4 STRL (CAST SUPPLIES) ×2 IMPLANT
PORT APPOLLO RF 90DEGREE MULTI (SURGICAL WAND) ×1 IMPLANT
SPONGE T-LAP 4X18 ~~LOC~~+RFID (SPONGE) ×3 IMPLANT
SUT ETHILON 3 0 PS 1 (SUTURE) ×3 IMPLANT
SUT VIC AB 2-0 CT1 27 (SUTURE) ×4
SUT VIC AB 2-0 CT1 TAPERPNT 27 (SUTURE) IMPLANT
SUT VICRYL 0 UR6 27IN ABS (SUTURE) ×3 IMPLANT
SWAB COLLECTION DEVICE MRSA (MISCELLANEOUS) ×2 IMPLANT
SWAB CULTURE ESWAB REG 1ML (MISCELLANEOUS) ×2 IMPLANT
SYR 20ML ECCENTRIC (SYRINGE) ×2 IMPLANT
SYR CONTROL 10ML LL (SYRINGE) IMPLANT
SYR TB 1ML LUER SLIP (SYRINGE) ×2 IMPLANT
TOWEL GREEN STERILE (TOWEL DISPOSABLE) ×2 IMPLANT
TOWEL GREEN STERILE FF (TOWEL DISPOSABLE) ×2 IMPLANT
TUBE CONNECTING 12X1/4 (SUCTIONS) ×2 IMPLANT
TUBING ARTHROSCOPY IRRIG 16FT (MISCELLANEOUS) ×2 IMPLANT
WATER STERILE IRR 1000ML POUR (IV SOLUTION) ×1 IMPLANT

## 2021-12-10 NOTE — Brief Op Note (Signed)
   12/10/2021  12:26 PM  PATIENT:  Becky Murphy  70 y.o. female  PRE-OPERATIVE DIAGNOSIS:  left knee synovitis  POST-OPERATIVE DIAGNOSIS:  left knee synovitis, medial and lateral meniscal tears, intraarticular infection  PROCEDURE:  Procedure(s): LEFT KNEE ARTHROSCOPY, DEBRIDEMENT, PLACEMENT OF STIMULAN BEADS, partial medial and lateral menisectomy  SURGEON:  Surgeon(s): August Saucer, Corrie Mckusick, MD  ASSISTANT: magnant pa  ANESTHESIA:   general  EBL: 25 ml    No intake/output data recorded.  BLOOD ADMINISTERED: none  DRAINS: (medium) Hemovact drain(s) in the right knee  with  Suction Clamped   LOCAL MEDICATIONS USED:  marcaine mso4 clonidine  SPECIMEN:  cxs x 4   COUNTS:  YES  TOURNIQUET:  * Missing tourniquet times found for documented tourniquets in log: [id number] *  DICTATION: .Other Dictation: Dictation Number done  PLAN OF CARE: Admit for overnight observation  PATIENT DISPOSITION:  PACU - hemodynamically stable

## 2021-12-10 NOTE — H&P (Signed)
Becky Murphy is an 70 y.o. female.   Chief Complaint: Left knee pain and swelling HPI: Becky Murphy is a 70 year old patient with left knee pain and swelling of at least 2 months duration.  Aspiration of the knee last week demonstrated elevated white count of approximately 40,000.  Cultures negative Gram stain negative and no crystals were present.  Similar event happened on the right knee which required arthroscopic debridement synovectomy and placement of stimulant beads.  Patient does not report any systemic signs of fevers or chills.  No other joint complaints.  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   Complex valvuloplasty including triangular resection of posterior leaflet, artificial Gore-tex neochord placement x6 and 34 mm Memo 3D ring annuloplasty via right mini thoracotomy approach with clipping of LA appendage   Thyroid nodule 01/29/2016   2.4 cm enhancing mixed cystic and solid nodule inferior left thyroid gland noted on CT angiogram    Past Surgical History:  Procedure Laterality Date   BACK SURGERY     BREAST BIOPSY Left ~ 2005   BREAST LUMPECTOMY Left ~ 2005   CARDIAC CATHETERIZATION N/A 01/16/2016   Procedure: Right/Left Heart Cath and  Coronary Angiography;  Surgeon: Thurmon Fair, MD;  Location: [facility]ASIVE [facility] LAB;  Service: Cardiovascular;  Laterality: N/A;   CLIPPING OF ATRIAL APPENDAGE Left 03/06/2016   Procedure: CLIPPING OF LEFT ATRIAL APPENDAGE using a 45 PRO2 AtriClip;  Surgeon: Purcell Nails, MD;  Location: [facility] OR;  Service: Open Heart Surgery;  Laterality: Left;   COLONOSCOPY     EUS N/A 02/20/2016   Procedure: ESOPHAGEAL ENDOSCOPIC ULTRASOUND (EUS) RADIAL;  Surgeon: Willis Modena, MD;  Location: [facility] ENDOSCOPY;  Service: Endoscopy;  Laterality: N/A;   EYE MUSCLE SURGERY Bilateral 1970's?   KNEE ARTHROSCOPY Right 04/04/2020   Procedure: RIGHT KNEE ARTHROSCOPY WITH DEBRIDEMENT;  Surgeon: Cammy Copa, MD;  Location: [facility] OR;  Service: Orthopedics;  Laterality: Right;   KNEE ARTHROSCOPY Right 04/10/2021   Procedure: right knee arthroscopy, debridement, placement of antibiotic beads;  Surgeon: Cammy Copa, MD;  Location: [facility] OR;  Service: Orthopedics;  Laterality: Right;   LUMBAR DISC SURGERY  1992   "trimmed bulges off both sides"   MITRAL VALVE REPAIR Right 03/06/2016   Procedure: MINIMALLY INVASIVE MITRAL VALVE REPAIR (MVR) using a 34 Sorin Memo 3D Ring;  Surgeon: Purcell Nails, MD;  Location: [facility] OR;  Service: Open Heart Surgery;  Laterality: Right;   TEE WITHOUT CARDIOVERSION N/A 01/05/2016   Procedure: TRANSESOPHAGEAL ECHOCARDIOGRAM (TEE);  Surgeon: Thurmon Fair, MD;  Location: [facility] ENDOSCOPY;  Service: Cardiovascular;  Laterality: N/A;   TEE WITHOUT CARDIOVERSION N/A 03/06/2016  Procedure: TRANSESOPHAGEAL ECHOCARDIOGRAM (TEE);  Surgeon: Purcell Nails, MD;  Location: [facility] OR;  Service: Open Heart Surgery;  Laterality: N/A;   TUBAL LIGATION      Family History  Problem Relation Age of Onset   Hypertension Mother    Cancer Mother 66       MELANOMA   Cancer Father        PROSTATE   Cancer Sister 54       BREAST   Social History:  reports that she has never smoked. She has never used smokeless tobacco.  She reports current alcohol use of about 10.0 standard drinks per week. She reports that she does not use drugs.  Allergies:  Allergies  Allergen Reactions   Vancomycin Rash    Medications Prior to Admission  Medication Sig Dispense Refill   aspirin 325 MG EC tablet Take 325 mg by mouth daily as needed for pain.     cholecalciferol (VITAMIN D3) 25 MCG (1000 UNIT) tablet Take 1,000 Units by mouth daily.     diphenhydrAMINE (BENADRYL) 25 MG tablet Take 25 mg by mouth daily as needed for allergies.     gabapentin (NEURONTIN) 300 MG capsule Take 900 mg by mouth at bedtime.     ibuprofen (ADVIL) 200 MG tablet Take 600 mg by mouth every 8 (eight) hours as needed for moderate pain.     Menthol, Topical Analgesic, ([employer] COLD & HOT MEDICATED EX) Apply 1 application topically at bedtime as needed (pain).     methocarbamol (ROBAXIN) 500 MG tablet Take 1 tablet (500 mg total) by mouth every 8 (eight) hours as needed for muscle spasms. 30 tablet 0   Multiple Vitamins-Minerals (MULTIVITAMIN WITH MINERALS) tablet Take 1 tablet by mouth daily.     oxyCODONE (OXY IR/ROXICODONE) 5 MG immediate release tablet Take 1 tablet (5 mg total) by mouth at bedtime as needed for moderate pain (pain score 4-6). 30 tablet 0    Results for orders placed or performed during the hospital encounter of 12/10/21 (from the past 48 hour(s))  SARS Coronavirus 2 by RT PCR (hospital order, performed in [facility] hospital lab) Nasopharyngeal Nasopharyngeal Swab     Status: Abnormal   Collection Time: 12/10/21  7:45 AM   Specimen: Nasopharyngeal Swab  Result Value Ref Range   SARS Coronavirus 2 POSITIVE (A) NEGATIVE    Comment: RESULT CALLED TO, READ BACK BY AND VERIFIED WITH: RN P PATTERSON [id number] AT 836 AM BY CM (NOTE) SARS-CoV-2 target nucleic acids are DETECTED  SARS-CoV-2 RNA is generally detectable in upper respiratory specimens  during the acute phase of infection.  Positive results are indicative  of the presence of  the identified virus, but do not rule out bacterial infection or co-infection with other pathogens not detected by the test.  Clinical correlation with patient history and  other diagnostic information is necessary to determine patient infection status.  The expected result is negative.  Fact Sheet for Patients:   BoilerBrush.com.cy   Fact Sheet for Healthcare Providers:   https://pope.com/    This test is not yet approved or cleared by the [place] FDA and  has been authorized for detection and/or diagnosis of SARS-CoV-2 by FDA under an Emergency Use Authorization (EUA).  This EUA will remain in effect (meaning this  test can be used) for the duration of  the COVID-19 declaration under Section 564(b)(1) of the Act, 21 U.S.C. section 360-bbb-3(b)(1), unless the authorization is terminated or revoked sooner.  Performed at [facility]  [facility], [address], [place], [place] [address]   Basic metabolic panel     Status: Abnormal   Collection Time: 12/10/21  9:02 AM  Result Value Ref Range   Sodium 139 135 - 145 mmol/L   Potassium 3.4 (L) 3.5 - 5.1 mmol/L   Chloride 106 98 - 111 mmol/L   CO2 24 22 - 32 mmol/L   Glucose, Bld 108 (H) 70 - 99 mg/dL    Comment: Glucose reference range applies only to samples taken after fasting for at least 8 hours.   BUN 11 8 - 23 mg/dL   Creatinine, Ser 2.53 0.44 - 1.00 mg/dL   Calcium 8.7 (L) 8.9 - 10.3 mg/dL   GFR, Estimated >66 >44 mL/min    Comment: (NOTE) Calculated using the CKD-EPI Creatinine Equation (2021)    Anion gap 9 5 - 15    Comment: Performed at [facility], [address] [place], [place] [address]   No results found.  Review of Systems  Musculoskeletal:  Positive for arthralgias.  All other systems reviewed and are negative.  Blood pressure (!) 166/96, pulse 92, temperature 98.7 F (37.1 C), temperature source Oral, resp. rate 18, height 5\' 6"  (1.676 m), weight  68 kg, SpO2 98 %. Physical Exam Vitals reviewed.  HENT:     Head: Normocephalic.     Nose: Nose normal.     Mouth/Throat:     Mouth: Mucous membranes are moist.  Eyes:     Pupils: Pupils are equal, round, and reactive to light.  Cardiovascular:     Rate and Rhythm: Normal rate.     Pulses: Normal pulses.  Pulmonary:     Effort: Pulmonary effort is normal.  Abdominal:     General: Abdomen is flat.  Musculoskeletal:     Cervical back: Normal range of motion.  Skin:    General: Skin is warm.     Capillary Refill: Capillary refill takes less than 2 seconds.  Neurological:     General: No focal deficit present.     Mental Status: She is alert.  Psychiatric:        Mood and Affect: Mood normal.  Examination of the right knee demonstrates very good range of motion with no effusion stable collateral cruciate ligaments and no warmth.  Left knee demonstrates large effusion extending into the suprapatellar pouch region.  No proximal lymphadenopathy.  Knee is warm.  Flexes to about 90 degrees and lacks about 10 degrees of full extension.  Extensor mechanism is intact.  Pedal pulses palpable bilaterally.  Assessment/Plan Impression is recurrent effusion possible indolent infection in the left knee.  We did aspiration last week with white count of approximately 40,000.  Similar issues occurred in the right knee which have now resolved with irrigation debridement arthroscopically with placement of stimulant beads and a course of antibiotics.  Plan is the same for the left knee.  No prior surgery.  Some mild degenerative changes are present.  Patient would prefer oral antibiotic treatment as opposed to IV treatment.  Plan to obtain cultures at the time of arthroscopic washout and synovectomy.  Plan to place the stimulant beads through small portal/arthrotomy on the superior lateral side.  Risk and benefits of the procedure are explained.  Currently the patient is COVID-positive but has no symptoms.   Anticipate overnight stay in the hospital with infectious disease consult.  All questions answered  [place], MD 12/10/2021, 10:27 AM

## 2021-12-10 NOTE — Anesthesia Procedure Notes (Signed)
Procedure Name: Intubation Date/Time: 12/10/2021 11:06 AM Performed by: Gwenyth Allegra, CRNA Pre-anesthesia Checklist: Patient identified, Emergency Drugs available, Suction available, Patient being monitored and Timeout performed Patient Re-evaluated:Patient Re-evaluated prior to induction Oxygen Delivery Method: Circle system utilized Preoxygenation: Pre-oxygenation with 100% oxygen Induction Type: IV induction and Rapid sequence Tube type: Oral Tube size: 7.0 mm Number of attempts: 1 Airway Equipment and Method: Stylet Placement Confirmation: ETT inserted through vocal cords under direct vision, positive ETCO2 and breath sounds checked- equal and bilateral Secured at: 21 cm Tube secured with: Tape Dental Injury: Teeth and Oropharynx as per pre-operative assessment

## 2021-12-10 NOTE — Anesthesia Preprocedure Evaluation (Signed)
Anesthesia Evaluation  Patient identified by MRN, date of birth, ID band Patient awake    Reviewed: Allergy & Precautions, H&P , NPO status , Patient's Chart, lab work & pertinent test results  Airway Mallampati: II   Neck ROM: full    Dental   Pulmonary Recent URI ,  COVID+   breath sounds clear to auscultation       Cardiovascular + Valvular Problems/Murmurs  Rhythm:regular Rate:Normal  S/p mini mitral valve repair   Neuro/Psych  Headaches, PSYCHIATRIC DISORDERS Anxiety Depression    GI/Hepatic GERD  ,  Endo/Other    Renal/GU      Musculoskeletal  (+) Arthritis ,   Abdominal   Peds  Hematology   Anesthesia Other Findings   Reproductive/Obstetrics                             Anesthesia Physical Anesthesia Plan  ASA: 2  Anesthesia Plan: General   Post-op Pain Management:    Induction: Intravenous  PONV Risk Score and Plan: 3 and Ondansetron, Dexamethasone, Midazolam and Treatment may vary due to age or medical condition  Airway Management Planned: Oral ETT  Additional Equipment:   Intra-op Plan:   Post-operative Plan: Extubation in OR  Informed Consent: I have reviewed the patients History and Physical, chart, labs and discussed the procedure including the risks, benefits and alternatives for the proposed anesthesia with the patient or authorized representative who has indicated his/her understanding and acceptance.     Dental advisory given  Plan Discussed with: CRNA, Anesthesiologist and Surgeon  Anesthesia Plan Comments:         Anesthesia Quick Evaluation

## 2021-12-10 NOTE — Plan of Care (Signed)
Consulted per septic left knee SP debridement and placement of stimulan beads. Started pt on vancomycin and ceftriaxone. OR cs negative so far.

## 2021-12-10 NOTE — Transfer of Care (Signed)
Immediate Anesthesia Transfer of Care Note  Patient: Becky Murphy  Procedure(s) Performed: LEFT KNEE ARTHROSCOPY, DEBRIDEMENT, PLACEMENT OF STIMULAN BEADS (Left: Knee)  Patient Location: PACU  Anesthesia Type:General  Level of Consciousness: awake, alert  and oriented  Airway & Oxygen Therapy: Patient Spontanous Breathing  Post-op Assessment: Report given to RN and Post -op Vital signs reviewed and stable  Post vital signs: Reviewed and stable  Last Vitals:  Vitals Value Taken Time  BP 138/93 12/10/21 1307  Temp    Pulse 79 12/10/21 1309  Resp 15 12/10/21 1309  SpO2 91 % 12/10/21 1309  Vitals shown include unvalidated device data.  Last Pain:  Vitals:   12/10/21 0817  TempSrc:   PainSc: 8          Complications: No notable events documented.

## 2021-12-10 NOTE — Progress Notes (Signed)
Pharmacy Antibiotic Note  Becky Murphy is a 70 y.o. female admitted on 12/10/2021 with  septic joint .  Pharmacy has been consulted for Daptomycin dosing.  Rash allergy to vancomycin  Plan: Daptomycin 500 mg iv Q 24 hours Pharmacy to sign off and follow weekly CK on Tuesdays   Height: 5\' 6"  (167.6 cm) Weight: 68 kg (150 lb) IBW/kg (Calculated) : 59.3  Temp (24hrs), Avg:98.1 F (36.7 C), Min:97 F (36.1 C), Max:98.7 F (37.1 C)  Recent Labs  Lab 12/10/21 0902  CREATININE 0.64    Estimated Creatinine Clearance: 62.1 mL/min (by C-G formula based on SCr of 0.64 mg/dL).    Allergies  Allergen Reactions   Vancomycin Rash    Thank you for allowing pharmacy to be a part of this patient's care.  14/12/22, PharmD 12/10/2021 4:46 PM

## 2021-12-10 NOTE — Progress Notes (Signed)
CRITICAL RESULT PROVIDER NOTIFICATION  Test performed and critical result:  Covid 19 +  Date and time result received:  12/10/2021; 0836  Provider name/title: Pauletta Browns, RN  Date and time provider notified: 12/10/2021 0838  Date and time provider responded: Dr. Chaney Malling and Dr. August Saucer aware.    Provider response: Per, Dr. August Saucer, we will proceed with procedure today.   Patient denies any symptoms today or any recent symptoms.

## 2021-12-11 ENCOUNTER — Encounter (HOSPITAL_COMMUNITY): Payer: Self-pay | Admitting: Orthopedic Surgery

## 2021-12-11 LAB — ENA+DNA/DS+ANTICH+CENTRO+JO...
Anti JO-1: <0.2 AI (ref 0.0–0.9)
Centromere Ab Screen: <0.2 AI (ref 0.0–0.9)
Chromatin Ab SerPl-aCnc: <0.2 AI (ref 0.0–0.9)
ENA SM Ab Ser-aCnc: <0.2 AI (ref 0.0–0.9)
Ribonucleic Protein: 1 AI — ABNORMAL HIGH (ref 0.0–0.9)
SSA (Ro) (ENA) Antibody, IgG: <0.2 AI (ref 0.0–0.9)
SSB (La) (ENA) Antibody, IgG: <0.2 AI (ref 0.0–0.9)
Scleroderma (Scl-70) (ENA) Antibody, IgG: <0.2 AI (ref 0.0–0.9)
ds DNA Ab: <1 IU/mL (ref 0–9)

## 2021-12-11 LAB — ANA W/REFLEX IF POSITIVE: Anti Nuclear Antibody (ANA): POSITIVE — AB

## 2021-12-11 LAB — RHEUMATOID FACTOR: Rheumatoid fact SerPl-aCnc: <10 IU/mL (ref <14.0)

## 2021-12-11 LAB — CK: Total CK: 73 U/L (ref 38–234)

## 2021-12-11 LAB — HIV ANTIBODY (ROUTINE TESTING W REFLEX): HIV Screen 4th Generation wRfx: NONREACTIVE

## 2021-12-11 NOTE — Evaluation (Signed)
Physical Therapy Evaluation Patient Details Name: Becky Murphy MRN: [id number] DOB: 21-Oct-1951 Today's Date: 12/11/2021  History of Present Illness  Pt is a 70 yo female who underwent s/p L knee I&D via arthroscopy with placement of stimulant beads due to L septic knee PMH: anxiety,a rthritis, GERD, s/p minimal invasive mitral valve repair   Clinical Impression  Pt seen for above. Pt eager to move but frustrated with having to wear L KI. Awaiting for ROM to L Knee orders in addition to if pt can trial amb without KI or if KI needs to remain in place for a period of time from Dr. August Saucer. Pt with most difficulty transferring from sit to stand and stand to sit due to inability of L knee flex due to KI. Pt requiring modA to power up and steady on RW. Suspect pt will do better without KI. Once orders received, PT will reassess pt if pt able to trial without AD. Acute PT to cont to follow.       Recommendations for follow up therapy are one component of a multi-disciplinary discharge planning process, led by the attending physician.  Recommendations may be updated based on patient status, additional functional criteria and insurance authorization.  Follow Up Recommendations Follow physician's recommendations for discharge plan and follow up therapies    Assistance Recommended at Discharge Frequent or constant Supervision/Assistance  Functional Status Assessment Patient has had a recent decline in their functional status and demonstrates the ability to make significant improvements in function in a reasonable and predictable amount of time.  Equipment Recommendations       Recommendations for Other Services       Precautions / Restrictions Precautions Precautions: Fall Required Braces or Orthoses: Knee Immobilizer - Left Knee Immobilizer - Left: Discontinue once straight leg raise with < 10 degree lag Restrictions Weight Bearing Restrictions: Yes LLE Weight Bearing: Weight bearing as  tolerated      Mobility  Bed Mobility Overal bed mobility: Needs Assistance Bed Mobility: Supine to Sit     Supine to sit: HOB elevated;Supervision     General bed mobility comments: pt used bilat UEs to assist L LE off EOB, no physical assist needed from PT    Transfers Overall transfer level: Needs assistance Equipment used: Rolling walker (2 wheels) Transfers: Sit to/from Stand Sit to Stand: Mod assist           General transfer comment: modA to power up from bed due to inability to bend L knee due to KI, max verbal cues for hand placement, minA for L LE management during sitting due to inability to bend, assisted on/off commode with modA and use of rail in bathroom    Ambulation/Gait Ambulation/Gait assistance: Min assist Gait Distance (Feet): 10 Feet (x2, to/from bathroom) Assistive device: Rolling walker (2 wheels) Gait Pattern/deviations: Step-to pattern;Decreased stride length Gait velocity: slow Gait velocity interpretation: <1.31 ft/sec, indicative of household ambulator   General Gait Details: pt with short step length and height, near shuffle  Stairs            Wheelchair Mobility    Modified Rankin (Stroke Patients Only)       Balance Overall balance assessment: Needs assistance Sitting-balance support: Feet supported;No upper extremity supported Sitting balance-Leahy Scale: Good     Standing balance support: Bilateral upper extremity supported;During functional activity Standing balance-Leahy Scale: Poor Standing balance comment: dependent on RW  Pertinent Vitals/Pain Pain Assessment: 0-10 Pain Score: 6  Pain Location: L knee Pain Descriptors / Indicators: Discomfort Pain Intervention(s): Monitored during session    Home Living Family/patient expects to be discharged to:: Private residence Living Arrangements: Spouse/significant other Available Help at Discharge: Family Type of Home:  House Home Access: Stairs to enter Entrance Stairs-Rails: Can reach both Entrance Stairs-Number of Steps: 1+1 short steps   Home Layout: One level Home Equipment: Agricultural consultant (2 wheels);Cane - single point      Prior Function Prior Level of Function : Independent/Modified Independent             Mobility Comments: indep ADLs Comments: indep     Hand Dominance   Dominant Hand: Right    Extremity/Trunk Assessment   Upper Extremity Assessment Upper Extremity Assessment: Overall WFL for tasks assessed    Lower Extremity Assessment Lower Extremity Assessment: LLE deficits/detail LLE Deficits / Details: pt in KI, awaiting ROM orders from Dr. August Saucer, pt able to WB some in KI during amb, used UEs to assist L LE off EOB    Cervical / Trunk Assessment Cervical / Trunk Assessment: Normal  Communication   Communication: No difficulties  Cognition Arousal/Alertness: Awake/alert Behavior During Therapy: WFL for tasks assessed/performed Overall Cognitive Status: Within Functional Limits for tasks assessed                                          General Comments General comments (skin integrity, edema, etc.): L knee incision not observed, L KI in optimal placement    Exercises     Assessment/Plan    PT Assessment Patient needs continued PT services  PT Problem List Decreased strength;Decreased activity tolerance;Decreased mobility       PT Treatment Interventions DME instruction;Gait training;Stair training;Functional mobility training;Therapeutic activities;Therapeutic exercise;Balance training;Neuromuscular re-education    PT Goals (Current goals can be found in the Care Plan section)  Acute Rehab PT Goals Patient Stated Goal: to get KI off PT Goal Formulation: With patient Time For Goal Achievement: 12/25/21 Potential to Achieve Goals: Good    Frequency Min 5X/week   Barriers to discharge        Co-evaluation               AM-PAC  PT "6 Clicks" Mobility  Outcome Measure Help needed turning from your back to your side while in a flat bed without using bedrails?: A Little Help needed moving from lying on your back to sitting on the side of a flat bed without using bedrails?: A Little Help needed moving to and from a bed to a chair (including a wheelchair)?: A Lot Help needed standing up from a chair using your arms (e.g., wheelchair or bedside chair)?: A Lot Help needed to walk in hospital room?: A Little Help needed climbing 3-5 steps with a railing? : A Lot 6 Click Score: 15    End of Session Equipment Utilized During Treatment: Gait belt;Left knee immobilizer Activity Tolerance: Patient tolerated treatment well Patient left: in chair;with call bell/phone within reach;with chair alarm set;with family/visitor present Nurse Communication: Mobility status PT Visit Diagnosis: Unsteadiness on feet (R26.81)    Time: 4854-6270 PT Time Calculation (min) (ACUTE ONLY): 21 min   Charges:   PT Evaluation $PT Eval Moderate Complexity: 1 Mod          Lewis Shock, PT, DPT Acute Rehabilitation Services Pager #: [phone]  Office #: [phone]   Iona Hansen 12/11/2021, 2:33 PM

## 2021-12-11 NOTE — Consult Note (Signed)
[facility]    Date of Admission:  12/10/2021   Total days of inpatient antibiotics: 1        Reason for Consult: Left knee septic arthritis    Principal Problem:   Septic arthritis of knee, left (HCC)   Assessment: 70 YF with mitral valve prolapse SP mitral valve repair, two episodes of Cx negative septic arthritis SP synovectomy and antibiotic bead placement in April 2022 followed by linezolid+ceftriaxone to compete 4 weeks of antibiotics, followed by infectious disease for left knee synovitis monitored with serial arthrocentesis(since June 2022) and off of antibiotics admitted for worsening left knee pain and swelling with plan for synovectomy.  # Left knee septic arthritis SP arthroscopy, debridement, antibiotic bead on 12/10/21 -In the OR found to have purulent fluid in left knee -Grams stain shows wbc, no organisms -Would start borad spectrum antibiotics, chances are Cx will be negative -Due to having b/l Cx negative knee septic arthritis, would recommend Rheum work-up. RH factor negative.  Recommendations:  -Continue Daptomcyin and ceftriaxone -Follow OR Cx -Anticipate 4 weeks of antibiotics from OR -Consider Rheumatology referral on discharge for underlying rheumatologic disease -Follow-up ANA, GC urine and IGRA  Microbiology:   Antibiotics: Cefazolin 12/12 Vancomycin 12/12 Daptomycin 12/12-p Gentamycin IJ 12/12 Ceftriaxone 12/12-9  Cultures: Other 12/12 OR cx: gram stain showed abundant wbc, both PMN and mononuclear, no organisms seen.  Cx: <24hrs 11/30 left knee synovial studies: 42K wbc 58% neurotrophils, 24% lymphocytes, 18% monos, negative Cx  HPI: Becky Murphy is a 70 y.o. female with MVP SP mitral valve repair in 2017, GERD, depression, two episodes of right knee Cx negative septic arthritis  treated with vancomycin x4 weeks in April 2021, then  synovectomy and antibiotic bead placement followed by vanc+  ceftriaxone(about 4 days)->ceftriaxone+linezolid(developed rash to vancomycin)  x4 week in April 2022 followed by ID (Dr. Orvan Falconer) then developed left knee swelling and monitored with serial arthrocentesis(25K wbc->8.4K->30K) off of antibiotics admitted or left knee arthroscopy with synovectomy and placement of antibiotic beads. She developed significant pain and had 40K wbc on arthrocentesis with negative Cx(11/30).  In the OR,  found to have purulent appearing fluid with Cx sent, significant synovitis debris in knee consistent with clumped white cells.  Today, pt is resting in bed. She reports her knee became extremely painful. She has been off of antibiotics. Besides the left knee pain, she has not systemic symptoms of fever, chills.   Review of Systems: Review of Systems  All other systems reviewed and are negative.  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   Complex  valvuloplasty including triangular resection of posterior leaflet, artificial Gore-tex neochord placement x6 and 34 mm Memo 3D ring annuloplasty via right mini thoracotomy approach with clipping of LA appendage   Thyroid nodule 01/29/2016   2.4 cm enhancing mixed cystic and solid nodule  inferior left thyroid gland noted on CT angiogram    Social History   Tobacco Use   Smoking status: Never   Smokeless tobacco: Never  Vaping Use   Vaping Use: Never used  Substance Use Topics   Alcohol use: Yes    Alcohol/week: 10.0 standard drinks    Types: 6 Glasses of wine, 4 Shots of liquor per week    Comment: occasional   Drug use: No    Family History  Problem Relation Age of Onset   Hypertension Mother    Cancer Mother 59       MELANOMA   Cancer Father        PROSTATE   Cancer Sister 21       BREAST   Scheduled Meds:  docusate sodium  100 mg Oral BID   gabapentin  900 mg Oral QHS   Continuous Infusions:  cefTRIAXone (ROCEPHIN)  IV 2 g (12/10/21 1647)   DAPTOmycin (CUBICIN)  IV 500 mg (12/10/21 1857)   methocarbamol (ROBAXIN) IV     PRN Meds:.acetaminophen, diphenhydrAMINE, HYDROcodone-acetaminophen, methocarbamol **OR** methocarbamol (ROBAXIN) IV, metoCLOPramide **OR** metoCLOPramide (REGLAN) injection, morphine injection, ondansetron **OR** ondansetron (ZOFRAN) IV Allergies  Allergen Reactions   Vancomycin Rash    OBJECTIVE: Blood pressure 106/80, pulse 64, temperature 98.4 F (36.9 C), resp. rate 19, height 5\' 6"  (1.676 m), weight 68 kg, SpO2 97 %.  Physical Exam Constitutional:      Appearance: Normal appearance.  HENT:     Head: Normocephalic and atraumatic.     Right Ear: Tympanic membrane normal.     Left Ear: Tympanic membrane normal.     Nose: Nose normal.     Mouth/Throat:     Mouth: Mucous membranes are moist.  Eyes:     Extraocular Movements: Extraocular movements intact.     Conjunctiva/sclera: Conjunctivae normal.     Pupils: Pupils are equal, round, and reactive to light.  Cardiovascular:     Rate and Rhythm: Normal rate and regular rhythm.     Heart sounds: No murmur heard.   No friction rub. No gallop.  Pulmonary:     Effort: Pulmonary effort is normal.     Breath sounds: Normal breath sounds.  Abdominal:     General:  Abdomen is flat.     Palpations: Abdomen is soft.  Musculoskeletal:     Comments: Left leg bandaged.   Skin:    General: Skin is warm and dry.  Neurological:     General: No focal deficit present.     Mental Status: She is alert and oriented to person, place, and time.  Psychiatric:        Mood and Affect: Mood normal.    Lab Results Lab Results  Component Value Date   WBC 7.9 11/28/2021   HGB 13.8 11/28/2021   HCT 41.5 11/28/2021   MCV 87.9 11/28/2021   PLT 362 11/28/2021    Lab Results  Component Value Date   CREATININE 0.64 12/10/2021   BUN 11 12/10/2021   NA 139 12/10/2021   K 3.4 (L) 12/10/2021   CL 106 12/10/2021   CO2 24 12/10/2021    Lab Results  Component Value Date   ALT 12 (L) 03/04/2016   AST  19 03/04/2016   ALKPHOS 60 03/04/2016   BILITOT 1.1 03/04/2016       Danelle Earthly, MD [facility] 12/11/2021, 1:05 PM

## 2021-12-11 NOTE — Progress Notes (Signed)
°  Subjective: Patient is a 70 year old female who is POD 1 s/p left knee arthroscopy with synovectomy, partial lateral medial meniscectomy, irrigation debridement, placement of stimulant beads.  Doing well today.  Denies any chest pain, shortness of breath, calf pain.  No fevers, chills, night sweats.  Pain is improved compared with yesterday. She discussed her previous experience with PICC line with infectious disease who has recommended daptomycin and ceftriaxone and started these antibiotics while she is inpatient.  She initially wanted to just have oral antibiotics but after discussion with ID, she is amenable to PICC line.  Objective: Vital signs in last 24 hours: Temp:  [98.4 F (36.9 C)] 98.4 F (36.9 C) (12/12 1426) Pulse Rate:  [64-78] 64 (12/13 0519) Resp:  [19] 19 (12/13 0519) BP: (106-121)/(80-89) 106/80 (12/13 0519) SpO2:  [94 %-98 %] 97 % (12/13 0519)  Intake/Output from previous day: 12/12 0701 - 12/13 0700 In: 360 [P.O.:360] Out: -  Intake/Output this shift: No intake/output data recorded.  Exam:  Exam demonstrates postop dressing is intact with no gross blood or drainage except over the medial superior portal.  Drain still in place.  Positive effusion.  Decreased warmth compared with yesterday.  No significant erythema noted.  Patient is able to perform straight leg raise with minimal extensor lag.  No calf tenderness.  Negative Homans' sign.  2+ DP pulse of the operative extremity.  Intact dorsiflexion plantarflexion.  Labs: No results for input(s): HGB in the last 72 hours. No results for input(s): WBC, RBC, HCT, PLT in the last 72 hours. Recent Labs    12/10/21 0902  NA 139  K 3.4*  CL 106  CO2 24  BUN 11  CREATININE 0.64  GLUCOSE 108*  CALCIUM 8.7*   No results for input(s): LABPT, INR in the last 72 hours.  Assessment/Plan: Patient is POD 1 s/p left knee irrigation and debridement via arthroscopy with placement of stimulant beads.  Feels she is doing  well with no significant complaints today aside from knee pain.  She is amenable to PICC line after discussion with ID so plan to have patient work with physical therapy today and she may go home after she receives PICC line either today or tomorrow.  Hemovac drain removed today.  Drain only had blood with no purulence noted in the drain.  Medial portal redressed after drain removed.  Okay to weight-bear as tolerated on the operative extremity but if she is can walk by herself she needs to use knee immobilizer unless she can do 10 straight leg raises which she could not do this morning.   Brent Noto L Saivon Prowse 12/11/2021, 2:04 PM

## 2021-12-11 NOTE — Op Note (Signed)
NAMEFUMIE, FIALLO MEDICAL RECORD NO: [id number] ACCOUNT NO: [id number] DATE OF BIRTH: [birth year] FACILITY: [facility] LOCATION: [facility] PHYSICIAN: Graylin Shiver. August Saucer, MD  Operative Report   DATE OF PROCEDURE: 12/10/2021  PREOPERATIVE DIAGNOSES:  Left knee intraarticular synovitis and likely intraarticular infection.  POSTOPERATIVE DIAGNOSES:  Left knee intraarticular synovitis and intraarticular native joint infection with medial and lateral meniscal tears.  PROCEDURES:  Left knee arthroscopy with synovectomy, partial medial and lateral meniscectomies and placement of antibiotic Stimulan beads.  SURGEON ATTENDING:  Cammy Copa, MD  ASSISTANT:  Karenann Cai.  INDICATIONS:  The patient is a 70 year old patient with left knee pain and recurrent effusions.  Had similar situation on the right knee where no cultures were able to grow organisms.  White count in this fluid 40,000 with significant pain. Presents now  for operative management after explanation of risks and benefits.  DESCRIPTION OF PROCEDURE:  The patient was brought to the operating room where general anesthetic was induced.  Antibiotics were held until cultures were obtained.  The left leg was prescrubbed with alcohol and Betadine and allowed to air dry, prepped  with DuraPrep solution and draped in a sterile manner.  After calling a timeout, anterior inferolateral and anterior inferomedial portals were established.  Purulent appearing fluid was present.  This was sent for cultures x4.  Cultures were obtained.   Anterior inferolateral portal was then established under direct visualization.  Diagnostic arthroscopy was performed.  The patient had significant synovitis throughout the knee including the anterior, posterior and lateral compartments and medial  compartments.  There was also debris floating within the knee consistent with clumped white cells.  Synovectomy was performed in all compartments.  A partial medial and lateral  meniscectomy was also required.  ACL and PCL were intact.  The patient had  grade IV degenerative changes on the lateral side and grade III changes on the medial side.  Patellofemoral joint also had grade III-IV changes.  Following complete synovectomy and irrigation with significant irrigating fluid, an accessory portal was  created superolaterally and superomedially.  15 liters total irrigation was utilized.  Hemovac drain placed through the portal on that superior medial side.  Stimulan beads then placed through the lateral portal which was extended.  Stimulan beads with  both vancomycin and gentamicin mixed.  They were placed into the joint.  A small 2.5 cm arthrotomy was then closed using 0 Vicryl suture.  Tourniquet was released.  The portals were then closed using 3-0 nylon.  Skin was closed over that lateral portal  using 2-0 Vicryl and 3-0 nylon.  A solution of Marcaine, morphine, clonidine injected into the knee while the suction was clamped.  We will release the suction at 115.  The patient tolerated the procedure well without immediate complications.  Bulky  dressing applied.  Luke's assistance was required for opening, closing, mobilization of tissue.  His assistance was a medical necessity.   SHW D: 12/10/2021 12:32:06 pm T: 12/10/2021 9:21:00 pm  JOB: [id number]/ [id number]

## 2021-12-11 NOTE — Progress Notes (Signed)
Physical Therapy Treatment Patient Details Name: Becky Murphy MRN: [id number] DOB: February 05, 1951 Today's Date: 12/11/2021   History of Present Illness Pt is a 70 yo female who underwent s/p L knee I&D via arthroscopy with placement of stimulant beads due to L septic knee PMH: anxiety,a rthritis, GERD, s/p minimal invasive mitral valve repair    PT Comments    Pt's mobility much improved from the evaluation earlier today.  Emphasis on gait training with the RW after warm up.    Recommendations for follow up therapy are one component of a multi-disciplinary discharge planning process, led by the attending physician.  Recommendations may be updated based on patient status, additional functional criteria and insurance authorization.  Follow Up Recommendations  Follow physician's recommendations for discharge plan and follow up therapies     Assistance Recommended at Discharge Frequent or constant Supervision/Assistance  Equipment Recommendations       Recommendations for Other Services       Precautions / Restrictions Precautions Precautions: Fall Required Braces or Orthoses: Knee Immobilizer - Left Knee Immobilizer - Left: Discontinue once straight leg raise with < 10 degree lag Restrictions Weight Bearing Restrictions: Yes LLE Weight Bearing: Weight bearing as tolerated     Mobility  Bed Mobility Overal bed mobility: Needs Assistance Bed Mobility: Supine to Sit     Supine to sit: HOB elevated;Supervision     General bed mobility comments: pt used bilat UEs to assist L LE off EOB, no physical assist needed from PT    Transfers Overall transfer level: Needs assistance Equipment used: Rolling walker (2 wheels) Transfers: Sit to/from Stand Sit to Stand: Min assist           General transfer comment: pt able to come up much more easily without the KI.  minor forward assist and boost.    Ambulation/Gait Ambulation/Gait assistance: Min guard Gait Distance (Feet):  50 Feet Assistive device: Rolling walker (2 wheels) Gait Pattern/deviations: Step-to pattern;Decreased stride length;Step-through pattern Gait velocity: slow Gait velocity interpretation: <1.8 ft/sec, indicate of risk for recurrent falls   General Gait Details: steady with RW, but mildly uncoordinated   Stairs             Wheelchair Mobility    Modified Rankin (Stroke Patients Only)       Balance Overall balance assessment: Needs assistance Sitting-balance support: Feet supported;No upper extremity supported Sitting balance-Leahy Scale: Good     Standing balance support: Bilateral upper extremity supported;During functional activity Standing balance-Leahy Scale: Poor Standing balance comment: dependent on RW                            Cognition Arousal/Alertness: Awake/alert Behavior During Therapy: WFL for tasks assessed/performed Overall Cognitive Status: Within Functional Limits for tasks assessed                                          Exercises Total Joint Exercises Straight Leg Raises: AROM;Both;10 reps;Supine    General Comments General comments (skin integrity, edema, etc.): L knee incision not observed, L KI in optimal placement      Pertinent Vitals/Pain Pain Assessment: 0-10 Pain Score: 6  Pain Location: L knee Pain Descriptors / Indicators: Discomfort Pain Intervention(s): Monitored during session    Home Living Family/patient expects to be discharged to:: Private residence Living Arrangements: Spouse/significant other Available Help at  Discharge: Family Type of Home: House Home Access: Stairs to enter Entrance Stairs-Rails: Can reach both Entrance Stairs-Number of Steps: 1+1 short steps   Home Layout: One level Home Equipment: Agricultural consultant (2 wheels);Cane - single point      Prior Function            PT Goals (current goals can now be found in the care plan section) Acute Rehab PT Goals Patient  Stated Goal: to get KI off PT Goal Formulation: With patient Time For Goal Achievement: 12/25/21 Potential to Achieve Goals: Good    Frequency    Min 5X/week      PT Plan      Co-evaluation              AM-PAC PT "6 Clicks" Mobility   Outcome Measure  Help needed turning from your back to your side while in a flat bed without using bedrails?: A Little Help needed moving from lying on your back to sitting on the side of a flat bed without using bedrails?: A Little Help needed moving to and from a bed to a chair (including a wheelchair)?: A Little Help needed standing up from a chair using your arms (e.g., wheelchair or bedside chair)?: A Little Help needed to walk in hospital room?: A Little Help needed climbing 3-5 steps with a railing? : A Lot 6 Click Score: 17    End of Session Equipment Utilized During Treatment: Gait belt;Left knee immobilizer Activity Tolerance: Patient tolerated treatment well Patient left: in chair;with call bell/phone within reach;with chair alarm set;with family/visitor present Nurse Communication: Mobility status PT Visit Diagnosis: Unsteadiness on feet (R26.81)     Time: 9485-4627 PT Time Calculation (min) (ACUTE ONLY): 17 min  Charges:  $Gait Training: 8-22 mins                     12/11/2021  Jacinto Halim., PT Acute Rehabilitation Services [phone]  (pager) [phone]  (office)   Eliseo Gum Talli Kimmer 12/11/2021, 5:04 PM

## 2021-12-12 ENCOUNTER — Inpatient Hospital Stay: Payer: Self-pay

## 2021-12-12 MED ORDER — CEFTRIAXONE IV (FOR PTA / DISCHARGE USE ONLY): 2.0000 g | INTRAVENOUS | 0 refills | Status: AC

## 2021-12-12 MED ORDER — CHLORHEXIDINE GLUCONATE CLOTH 2 % EX PADS
6.0000 | MEDICATED_PAD | Freq: Every day | CUTANEOUS | Status: DC
  Administered 2021-12-12: 10:00:00 6 via TOPICAL

## 2021-12-12 MED ORDER — SODIUM CHLORIDE 0.9% FLUSH: 10.0000 mL | INTRAVENOUS | Status: DC | PRN

## 2021-12-12 MED ORDER — DAPTOMYCIN IV (FOR PTA / DISCHARGE USE ONLY): 500.0000 mg | INTRAVENOUS | 0 refills | Status: AC

## 2021-12-12 MED ORDER — HYDROCODONE-ACETAMINOPHEN 5-325 MG PO TABS: 1.0000 | ORAL_TABLET | Freq: Four times a day (QID) | ORAL | 0 refills | Status: DC | PRN

## 2021-12-12 NOTE — Progress Notes (Signed)
Patient doing well.  Vital signs stable.  Denies any chest pain, shortness of breath, significant persistent cough, fevers, chills, night sweats.  On exam, she has trace effusion but no significant return of knee effusion like compared with prior to surgery.  Dressings are clean dry and intact.  She performed well with physical therapy yesterday.  Plan for her to obtain PICC line and then she may discharge home with IV antibiotics.  Follow-up with Dr. August Saucer in 7 to 10 days postoperatively.  We will arrange outpatient follow-up with rheumatology.  Appreciate input from the infectious disease team.

## 2021-12-12 NOTE — Plan of Care (Signed)
Care plan has been resolved by prior shift.

## 2021-12-12 NOTE — Progress Notes (Signed)
Physical Therapy Treatment Patient Details Name: Becky Murphy MRN: [id number] DOB: 1951-03-25 Today's Date: 12/12/2021   History of Present Illness Pt is a 70 yo female who underwent s/p L knee I&D via arthroscopy with placement of stimulant beads due to L septic knee PMH: anxiety,a rthritis, GERD, s/p minimal invasive mitral valve repair    PT Comments    Continuing work on functional mobility and activity tolerance;  session focused on amb in room and stair training in prep for getting home; Pt moved very slowly, but overall managing well; session conducdted on room Air and O2 sats 96-98% end of session   Recommendations for follow up therapy are one component of a multi-disciplinary discharge planning process, led by the attending physician.  Recommendations may be updated based on patient status, additional functional criteria and insurance authorization.  Follow Up Recommendations  Follow physician's recommendations for discharge plan and follow up therapies     Assistance Recommended at Discharge Frequent or constant Supervision/Assistance  Equipment Recommendations  Rolling walker (2 wheels);BSC/3in1    Recommendations for Other Services       Precautions / Restrictions Precautions Precautions: Fall Precaution Comments: Fall risk is present but significantly reduced with use of RW Required Braces or Orthoses: Knee Immobilizer - Left (able to perform SLRx10 with minimal lag) Knee Immobilizer - Left: Discontinue once straight leg raise with < 10 degree lag Restrictions LLE Weight Bearing: Weight bearing as tolerated     Mobility  Bed Mobility Overal bed mobility: Needs Assistance Bed Mobility: Supine to Sit     Supine to sit: HOB elevated;Supervision     General bed mobility comments: pt used bilat UEs to assist L LE off EOB, no physical assist needed from PT    Transfers Overall transfer level: Needs assistance Equipment used: Rolling walker (2  wheels) Transfers: Sit to/from Stand Sit to Stand: Min assist           General transfer comment: minor forward assist and boost.    Ambulation/Gait Ambulation/Gait assistance: Min guard Gait Distance (Feet): 40 Feet (in and around room) Assistive device: Rolling walker (2 wheels) Gait Pattern/deviations: Step-to pattern;Decreased stride length;Step-through pattern       General Gait Details: steady with RW, but mildly uncoordinated; painful in LLE stance   Stairs Stairs: Yes Stairs assistance: Min guard Stair Management: No rails;With walker;Forwards;Backwards Number of Stairs: 1 (performed 3x) General stair comments: Cues for RW use and sequence; Painful LLE, but managing well   Wheelchair Mobility    Modified Rankin (Stroke Patients Only)       Balance     Sitting balance-Leahy Scale: Good       Standing balance-Leahy Scale: Poor Standing balance comment: dependent on RW                            Cognition Arousal/Alertness: Awake/alert Behavior During Therapy: WFL for tasks assessed/performed Overall Cognitive Status: Within Functional Limits for tasks assessed                                          Exercises Total Joint Exercises Straight Leg Raises: AROM;Left;10 reps    General Comments General comments (skin integrity, edema, etc.): Nice, stable L knee in stance, no need for KI      Pertinent Vitals/Pain Pain Assessment: Faces Pain Score: 7  Faces Pain  Scale: Hurts whole lot Pain Location: L knee Pain Descriptors / Indicators: Discomfort Pain Intervention(s): Monitored during session    Home Living                          Prior Function            PT Goals (current goals can now be found in the care plan section) Acute Rehab PT Goals Patient Stated Goal: Hopes to get home today PT Goal Formulation: With patient Time For Goal Achievement: 12/25/21 Potential to Achieve Goals:  Good Progress towards PT goals: Progressing toward goals    Frequency    Min 5X/week      PT Plan Current plan remains appropriate    Co-evaluation              AM-PAC PT "6 Clicks" Mobility   Outcome Measure  Help needed turning from your back to your side while in a flat bed without using bedrails?: A Little Help needed moving from lying on your back to sitting on the side of a flat bed without using bedrails?: A Little Help needed moving to and from a bed to a chair (including a wheelchair)?: A Little Help needed standing up from a chair using your arms (e.g., wheelchair or bedside chair)?: A Little Help needed to walk in hospital room?: A Little Help needed climbing 3-5 steps with a railing? : A Little 6 Click Score: 18    End of Session Equipment Utilized During Treatment: Gait belt Activity Tolerance: Patient tolerated treatment well Patient left: in chair;with call bell/phone within reach;with chair alarm set;with family/visitor present Nurse Communication: Mobility status PT Visit Diagnosis: Unsteadiness on feet (R26.81)     Time: 3300-7622 PT Time Calculation (min) (ACUTE ONLY): 38 min  Charges:  $Gait Training: 23-37 mins $Therapeutic Activity: 8-22 mins                     Van Clines, PT  Acute Rehabilitation Services Pager [phone] Office [phone]    Levi Aland 12/12/2021, 3:33 PM

## 2021-12-12 NOTE — Progress Notes (Signed)
Peripherally Inserted Central Catheter Placement  The IV Nurse has discussed with the patient and/or persons authorized to consent for the patient, the purpose of this procedure and the potential benefits and risks involved with this procedure.  The benefits include less needle sticks, lab draws from the catheter, and the patient may be discharged home with the catheter. Risks include, but not limited to, infection, bleeding, blood clot (thrombus formation), and puncture of an artery; nerve damage and irregular heartbeat and possibility to perform a PICC exchange if needed/ordered by physician.  Alternatives to this procedure were also discussed.  Bard Power PICC patient education guide, fact sheet on infection prevention and patient information card has been provided to patient /or left at bedside.    PICC Placement Documentation  PICC Single Lumen 12/12/21 Left Brachial 42 cm 0 cm (Active)  Indication for Insertion or Continuance of Line Home intravenous therapies (PICC only) 12/12/21 0842  Exposed Catheter (cm) 0 cm 12/12/21 0842  Site Assessment Clean;Dry;Intact 12/12/21 0842  Line Status Flushed;Saline locked;Blood return noted 12/12/21 0842  Dressing Type Transparent 12/12/21 0842  Dressing Status Clean;Dry;Intact 12/12/21 0842  Antimicrobial disc in place? Yes 12/12/21 0842  Dressing Intervention New dressing;Other (Comment) 12/12/21 0842  Dressing Change Due 12/19/21 12/12/21 0842       Reginia Forts [place]ce 12/12/2021, 8:43 AM

## 2021-12-12 NOTE — Anesthesia Postprocedure Evaluation (Signed)
Anesthesia Post Note  Patient: Becky Murphy  Procedure(s) Performed: LEFT KNEE ARTHROSCOPY, DEBRIDEMENT, PLACEMENT OF STIMULAN BEADS (Left: Knee)     Patient location during evaluation: PACU Anesthesia Type: General Level of consciousness: awake and alert Pain management: pain level controlled Vital Signs Assessment: post-procedure vital signs reviewed and stable Respiratory status: spontaneous breathing, nonlabored ventilation, respiratory function stable and patient connected to nasal cannula oxygen Cardiovascular status: blood pressure returned to baseline and stable Postop Assessment: no apparent nausea or vomiting Anesthetic complications: no   No notable events documented.  Last Vitals:  Vitals:   12/12/21 0508 12/12/21 0748  BP: 118/78 111/85  Pulse: 81 75  Resp: 20 16  Temp: 36.7 C 37.1 C  SpO2: 98% 98%    Last Pain:  Vitals:   12/12/21 0618  TempSrc:   PainSc: Asleep                 Sukari Grist S

## 2021-12-12 NOTE — TOC Initial Note (Addendum)
Transition of Care [facility]) - Initial/Assessment Note    Patient Details  Name: Becky Murphy MRN: [id number] Date of Birth: Dec 20, 1951  Transition of Care [facility]) CM/SW Contact:    Kingsley Plan, RN Phone Number: 12/12/2021, 10:17 AM  Clinical Narrative:                 Spoke to patient and husband at bedside.   Patient has done home IV ABX in the past.   Both aware they will be taught how to administer IV ABX at home prior to hospital discharge. They will have a HHRN however HHRN will not be present every time a dose is due.   They had [facility] in the past and wondering if they could have a different home health agency. NCM confirmed with [facility] that [facility] is the only agency that pays for IV supplies, if they have another agency patient would be responsible for cost of supplies. NCM offered to get a exact cost and if too expensive , then ask [facility] if they could have a different HHRN. Patient and husband would like [facility] if they can have another Charity fundraiser.   NCM has left a message for [employer] with [facility]. Awaiting call back.    Pam with [facility] aware and will provide teaching prior to discharge. [facility] pricing some ABX , prior to final home antibiotics determined    Kandee Keen with [facility] accepted referral and will make sure patient has a different home health nurse. Patient and husband aware.  Pam with [facility] will provide teaching this afternoon, patient will need to have 1600 doses of ABX here prior to discharge this evening. PA , patient , [facility] ,husband and nurse ware   Expected Discharge Plan: Home w Home Health Services Barriers to Discharge: Continued Medical Work up   Patient Goals and CMS Choice Patient states their goals for this hospitalization and ongoing recovery are:: to return to home CMS Medicare.gov Compare Post Acute Care list provided to:: Patient Choice offered to / list presented to : Patient, Spouse  Expected Discharge Plan and Services Expected Discharge  Plan: Home w Home Health Services   Discharge Planning Services: CM Consult Post Acute Care Choice: Home Health Living arrangements for the past 2 months: Single Family Home                   DME Agency: NA       HH Arranged: RN, PT          Prior Living Arrangements/Services Living arrangements for the past 2 months: Single Family Home Lives with:: Spouse Patient language and need for interpreter reviewed:: Yes Do you feel safe going back to the place where you live?: Yes      Need for Family Participation in Patient Care: Yes (Comment) Care giver support system in place?: Yes (comment) Current home services: DME Criminal Activity/Legal Involvement Pertinent to Current Situation/Hospitalization: No - Comment as needed  Activities of Daily Living Home Assistive Devices/Equipment: Contact lenses ADL Screening (condition at time of admission) Patient's cognitive ability adequate to safely complete daily activities?: Yes Is the patient deaf or have difficulty hearing?: No Does the patient have difficulty seeing, even when wearing glasses/contacts?: No Does the patient have difficulty concentrating, remembering, or making decisions?: No Patient able to express need for assistance with ADLs?: Yes Does the patient have difficulty dressing or bathing?: Yes Independently performs ADLs?: Yes (appropriate for developmental age) Does the patient have difficulty walking or climbing stairs?: Yes Weakness of  Legs: Both Weakness of Arms/Hands: Both  Permission Sought/Granted   Permission granted to share information with : Yes, Verbal Permission Granted  Share Information with NAME: [name] spouse  Permission granted to share info w AGENCY: [facility]        Emotional Assessment Appearance:: Appears stated age Attitude/Demeanor/Rapport: Engaged Affect (typically observed): Accepting Orientation: : Oriented to Self, Oriented to Place, Oriented to  Time, Oriented to  Situation Alcohol / Substance Use: Not Applicable Psych Involvement: No (comment)  Admission diagnosis:  Septic arthritis of knee, left [facility]) [M00.9] Patient Active Problem List   Diagnosis Date Noted   Septic arthritis of knee, left (HCC) 12/10/2021   Synovitis of left knee 07/19/2021   Vancomycin adverse reaction 04/19/2021   Therapeutic drug monitoring 04/19/2021   Synovitis of right knee 04/04/2020   Long term (current) use of anticoagulants [Z79.01] 03/14/2016   S/P minimally invasive mitral valve repair 03/06/2016   Pancreatic mass 01/29/2016   Thyroid nodule 01/29/2016   Exertional dyspnea 01/05/2016   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 [place] [address] Phone: [phone] Fax: [phone]  [facility] [place] - [address] [address] [address] [place] [address] Phone: [phone] Fax: [phone]  [facility] - [place], [place] - [address] [place] [address] Phone: [phone] Fax: [phone]     Social Determinants of Health (SDOH) Interventions    Readmission Risk Interventions No flowsheet data found.

## 2021-12-12 NOTE — Progress Notes (Signed)
[facility] for Infectious Disease  Date of Admission:  12/10/2021   Total days of inpatient antibiotics 3  Principal Problem:   Septic arthritis of knee, left ([place])          18 YF with mitral valve prolapse SP mitral valve repair, two episodes of Cx negative septic arthritis SP synovectomy and antibiotic bead placement in April 2022 followed by linezolid+ceftriaxone to compete 4 weeks of antibiotics, followed by infectious disease for left knee synovitis monitored with serial arthrocentesis(since June 2022) and off of antibiotics admitted for worsening left knee pain and swelling with plan for synovectomy.   # Left knee septic arthritis SP arthroscopy, debridement, antibiotic bead on 12/10/21 -In the OR found to have purulent fluid in left knee -Grams stain shows wbc, no organisms -Continue  broad spectrum antibiotics, chances are Cx will be negative -Due to having b/l Cx negative knee septic arthritis, would recommend Rheum work-up.    Recommendations:  -Continue Daptomcyin and ceftriaxone -Follow OR Cx -Anticipate 4 weeks of IV antibiotics from OR(end date 01/06/22) -Consider Rheumatology referral as RNP was slightly positive(1)  OPAT ORDERS:  Diagnosis: Cx negative septic arthritis  Culture Result: NGTD  Allergies  Allergen Reactions   Vancomycin Rash     Discharge antibiotics to be given via PICC line:  Per pharmacy protocol daptomycin and ceftriaxone    Duration: 4 weeks  End Date: 01/06/22  [facility] Care Per Protocol with [employer] Use: Home health RN for IV administration and teaching, line care and labs.    Labs weekly while on IV antibiotics: __ CBC with differential __ CMP __ CRP __ ESR __ CK  __ Please pull PIC at completion of IV antibiotics   Fax weekly labs to [phone]  Clinic Follow Up Appt: 12/28 at 3:15pm  @ [facility] with Dr. Candiss Norse   Microbiology:   Antibiotics: Cefazolin 12/12 Vancomycin 12/12 Daptomycin  12/12-p Gentamycin IJ 12/12 Ceftriaxone 12/12-9   Cultures: Other 12/12 OR cx: gram stain showed abundant wbc, both PMN and mononuclear, no organisms seen.  Cx: <24hrs 11/30 left knee synovial studies: 42K wbc 58% neurotrophils, 24% lymphocytes, 18% monos, negative Cx  SUBJECTIVE: Resting in bed. Husband is at bedside. Pt reports she is agreeable to IV antibiotics  Review of Systems: Review of Systems  All other systems reviewed and are negative.   Scheduled Meds: Continuous Infusions: PRN Meds:. Allergies  Allergen Reactions   Vancomycin Rash    OBJECTIVE: Vitals:   12/11/21 1600 12/11/21 2137 12/12/21 0508 12/12/21 0748  BP: 108/77 117/86 118/78 111/85  Pulse: 88 76 81 75  Resp: _0 Temp:   98 F (36.7 C) 98.7 F (37.1 C)  TempSrc: Oral  Oral   SpO2: 98% 99% 98% 98%  Weight:      Height:       Body mass index is 24.21 kg/m.  Physical Exam Constitutional:      Appearance: Normal appearance.  HENT:     Head: Normocephalic and atraumatic.     Right Ear: Tympanic membrane normal.     Left Ear: Tympanic membrane normal.     Nose: Nose normal.     Mouth/Throat:     Mouth: Mucous membranes are moist.  Eyes:     Extraocular Movements: Extraocular movements intact.     Conjunctiva/sclera: Conjunctivae normal.     Pupils: Pupils are equal, round, and reactive to light.  Cardiovascular:     Rate  Rhythm: Normal rate and regular rhythm.  °   Heart sounds: No murmur heard. °  No friction rub. No gallop.  °Pulmonary:  °   Effort: Pulmonary effort is normal.  °   Breath sounds: Normal breath sounds.  °Abdominal:  °   General: Abdomen is flat.  °   Palpations: Abdomen is soft.  °Musculoskeletal:  °   Comments: Left knee with some mild edema  °Skin: °   General: Skin is warm and dry.  °Neurological:  °   General: No focal deficit present.  °   Mental Status: She is alert and oriented to person, place, and time.  °Psychiatric:     °   Mood and Affect: Mood normal.   ° ° ° ° °Lab Results °Lab Results  °Component Value Date  ° WBC 7.9 11/28/2021  ° HGB 13.8 11/28/2021  ° HCT 41.5 11/28/2021  ° MCV 87.9 11/28/2021  ° PLT 362 11/28/2021  °  °Lab Results  °Component Value Date  ° CREATININE 0.64 12/10/2021  ° BUN 11 12/10/2021  ° NA 139 12/10/2021  ° K 3.4 (L) 12/10/2021  ° CL 106 12/10/2021  ° CO2 24 12/10/2021  °  °Lab Results  °Component Value Date  ° ALT 12 (L) 03/04/2016  ° AST 19 03/04/2016  ° ALKPHOS 60 03/04/2016  ° BILITOT 1.1 03/04/2016  °  ° ° ° ° °Michaelina Blandino, MD °[facility] °[facility] °12/12/2021, 11:53 PM  °

## 2021-12-14 ENCOUNTER — Telehealth: Payer: Self-pay | Admitting: Orthopedic Surgery

## 2021-12-14 LAB — QUANTIFERON-TB GOLD PLUS (RQFGPL)
QuantiFERON Mitogen Value: 0.37 IU/mL
QuantiFERON Nil Value: 0.02 IU/mL
QuantiFERON TB1 Ag Value: 0.02 IU/mL
QuantiFERON TB2 Ag Value: 0.02 IU/mL

## 2021-12-14 LAB — QUANTIFERON-TB GOLD PLUS: QuantiFERON-TB Gold Plus: UNDETERMINED — AB

## 2021-12-14 NOTE — Telephone Encounter (Signed)
Yes she can but leave waterproof dressings on

## 2021-12-14 NOTE — Telephone Encounter (Signed)
Pt had surgery on 12/12. Wondering can she take bandage off knee?   CB [phone]

## 2021-12-14 NOTE — Telephone Encounter (Signed)
I called patient and advised. 

## 2021-12-15 LAB — AEROBIC/ANAEROBIC CULTURE W GRAM STAIN (SURGICAL/DEEP WOUND): Culture: NO GROWTH

## 2021-12-17 ENCOUNTER — Ambulatory Visit (INDEPENDENT_AMBULATORY_CARE_PROVIDER_SITE_OTHER): Payer: Medicare Other | Admitting: Surgical

## 2021-12-17 ENCOUNTER — Other Ambulatory Visit: Payer: Self-pay

## 2021-12-17 DIAGNOSIS — M25462 Effusion, left knee: Secondary | ICD-10-CM

## 2021-12-17 DIAGNOSIS — M25461 Effusion, right knee: Secondary | ICD-10-CM

## 2021-12-19 ENCOUNTER — Encounter: Payer: Self-pay | Admitting: Internal Medicine

## 2021-12-19 ENCOUNTER — Encounter: Payer: Self-pay | Admitting: Orthopedic Surgery

## 2021-12-19 ENCOUNTER — Other Ambulatory Visit: Payer: Self-pay

## 2021-12-19 ENCOUNTER — Ambulatory Visit (INDEPENDENT_AMBULATORY_CARE_PROVIDER_SITE_OTHER): Payer: Medicare Other | Admitting: Internal Medicine

## 2021-12-19 VITALS — BP 176/92 | HR 86 | Temp 98.1°F

## 2021-12-19 DIAGNOSIS — M009 Pyogenic arthritis, unspecified: Secondary | ICD-10-CM | POA: Diagnosis present

## 2021-12-19 NOTE — Progress Notes (Signed)
Post-Op Visit Note   Patient: Becky Murphy           Date of Birth: Jul 07, 1951           MRN: [id number] Visit Date: 12/17/2021 PCP: Kaleen Mask, MD   Assessment & Plan:  Chief Complaint:  Chief Complaint  Patient presents with   Left Knee - Routine Post Op    12/10/21 (7d) Left Knee Arthroscopy, Debridement, Placement Of Stimulan Beads -     Visit Diagnoses:  1. Effusion, left knee   2. Effusion of right knee     Plan: Patient is a 70 year old female who presents for evaluation s/p left knee arthroscopic irrigation and debridement with stimulant bead placement on 12/10/2021.  She is doing well overall and full weightbearing with a cane with no knee immobilizer.  Denies any fevers, chills, night sweats, drainage.  Pain is improved compared with prior to surgery and she feels mostly back to her baseline level of pain in the left knee from the severe arthritis in her knee.  She has had no rash from the daptomycin that she is receiving through the PICC line in contrast to the rash that she had with vancomycin in the past.  Anaerobic and aerobic cultures from time of surgery have not grown any organisms.  On examination, patient has 0 degrees extension and 100 degrees of knee flexion.  Sutures are intact with incisions healing well without any evidence of infection or dehiscence.  Sutures removed and replaced with Steri-Strips.  She does have an effusion which is mild to moderate but significantly reduced compared with prior to surgery.  No significant abnormal warmth to the effusion.  She is able to perform straight leg raise.  She does not appear sick on exam.  She is able to ambulate with antalgic gait.  Plan is to continue with PICC line antibiotics and okay to weight-bear as tolerated on the operative leg.  She has appointment to see Dr. Thedore Mins with infectious disease on 12/21.  Per infectious disease recommendations while she was inpatient, plan to refer Andrey Campanile to  rheumatology for further evaluation given the fact that she has had now 3 episodes of culture-negative infectious knee effusion that required irrigation and debridement in the operating room (2 in the right knee, 1 on the left knee).  Her ANA was positive in the hospital but most of the other markers were negative.  Follow-up in 4 weeks for clinical recheck with Dr. August Saucer.  Follow-Up Instructions: No follow-ups on file.   Orders:  Orders Placed This Encounter  Procedures   Ambulatory referral to Rheumatology   No orders of the defined types were placed in this encounter.   Imaging: No results found.  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   Mitral insufficiency    Mitral valve prolapse 12/26/2015   Anxiety 11/02/2015   Past Medical History:  Diagnosis Date   Anemia    low iron in the past   Anxiety    pt denies  Arthritis    "back?" (11/03/2015)   Chest pain 11/02/2015   Depression    pt denies   Exertional dyspnea 01/05/2016   GERD (gastroesophageal reflux disease)    Heart murmur    History of peptic ulcer "late 1970's"   Migraine    "maybe 3-4/yr now" (11/03/2015)   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/2016   Complex valvuloplasty including triangular resection of posterior leaflet, artificial Gore-tex neochord placement x6 and 34 mm Memo 3D ring annuloplasty via right mini thoracotomy approach with clipping of LA appendage   Thyroid nodule 01/29/2016   2.4 cm enhancing mixed cystic and solid nodule inferior left thyroid gland noted on CT angiogram    Family History  Problem Relation Age of Onset   Hypertension Mother    Cancer Mother 67       MELANOMA   Cancer Father        PROSTATE   Cancer Sister 9       BREAST    Past Surgical History:  Procedure Laterality Date   BACK SURGERY     BREAST BIOPSY Left ~ 2005   BREAST LUMPECTOMY Left ~ 2005   CARDIAC CATHETERIZATION N/A 01/16/2016   Procedure: Right/Left Heart Cath and Coronary Angiography;  Surgeon: Thurmon Fair, MD;  Location: [facility] INVASIVE CV LAB;  Service: Cardiovascular;  Laterality: N/A;   CLIPPING OF ATRIAL APPENDAGE Left 03/06/2016   Procedure: CLIPPING OF LEFT ATRIAL APPENDAGE using a 45 PRO2 AtriClip;  Surgeon: Purcell Nails, MD;  Location: [facility] OR;  Service: Open Heart Surgery;  Laterality: Left;   COLONOSCOPY     EUS N/A 02/20/2016   Procedure: ESOPHAGEAL ENDOSCOPIC ULTRASOUND (EUS) RADIAL;  Surgeon: Willis Modena, MD;  Location: [facility] ENDOSCOPY;  Service: Endoscopy;  Laterality: N/A;   EYE MUSCLE SURGERY Bilateral 1970's?   KNEE ARTHROSCOPY Right 04/04/2020   Procedure: RIGHT KNEE ARTHROSCOPY WITH DEBRIDEMENT;  Surgeon: Cammy Copa, MD;  Location[facility] OR;  Service: Orthopedics;  Laterality: Right;   KNEE ARTHROSCOPY Right 04/10/2021   Procedure: right knee arthroscopy, debridement, placement of antibiotic beads;  Surgeon: Cammy Copa, MD;  Location: [facility] OR;  Service: Orthopedics;  Laterality: Right;   KNEE ARTHROSCOPY Left 12/10/2021   Procedure: LEFT KNEE ARTHROSCOPY, DEBRIDEMENT, PLACEMENT OF STIMULAN  BEADS;  Surgeon: Cammy Copa, MD;  Location: [facility] OR;  Service: Orthopedics;  Laterality: Left;   LUMBAR DISC SURGERY  1992   "trimmed bulges off both sides"   MITRAL VALVE REPAIR Right 03/06/2016   Procedure: MINIMALLY INVASIVE MITRAL VALVE REPAIR (MVR) using a 34 Sorin Memo 3D Ring;  Surgeon: Purcell Nails, MD;  Location: [facility] OR;  Service: Open Heart Surgery;  Laterality: Right;   TEE WITHOUT CARDIOVERSION N/A 01/05/2016   Procedure: TRANSESOPHAGEAL ECHOCARDIOGRAM (TEE);  Surgeon: Thurmon Fair, MD;  Location: [facility] ENDOSCOPY;  Service: Cardiovascular;  Laterality: N/A;   TEE WITHOUT CARDIOVERSION N/A 03/06/2016   Procedure: TRANSESOPHAGEAL ECHOCARDIOGRAM (TEE);  Surgeon: Purcell Nails, MD;  Location: [facility] OR;  Service: Open Heart Surgery;  Laterality: N/A;   TUBAL LIGATION     Social History   Occupational  History   Not on file  Tobacco Use   Smoking status: Never   Smokeless tobacco: Never  Vaping Use   Vaping Use: Never used  Substance and Sexual Activity   Alcohol use: Yes    Alcohol/week: 10.0 standard drinks    Types: 6 Glasses of wine, 4 Shots of liquor per week    Comment: occasional   Drug use: No   Sexual activity: Not Currently    Birth control/protection: Post-menopausal

## 2021-12-19 NOTE — Progress Notes (Signed)
Patient: Becky Murphy  DOB: 23-Jul-1951 MRN: [id number] PCP: Leonard Downing, MD    Chief Complaint  Patient presents with   Follow-up    Synovitis of left knee     Patient Active Problem List   Diagnosis Date Noted   Septic arthritis of knee, left ([place]) 12/10/2021   Synovitis of left knee 07/19/2021   Vancomycin adverse reaction 04/19/2021   Therapeutic drug monitoring 04/19/2021   Synovitis of right knee 04/04/2020   Long term (current) use of anticoagulants [Z79.01] 03/14/2016   S/P minimally invasive mitral valve repair 03/06/2016   Pancreatic mass 01/29/2016   Thyroid nodule 01/29/2016   Exertional dyspnea 01/05/2016   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 F presents for hospital follow up of left knee septic arthritis.  Sh has a past medical history of MVP SP mitral valve repair in 2017, GERD, depression, two episodes of right knee Cx negative septic arthritis  treated with vancomycin x4 weeks in April 2021, then synovectomy and antibiotic bead placement followed by vanc+ ceftriaxone(about 4 days)->ceftriaxone+linezolid(developed rash to vancomycin)  x4 weeks in April 2022.Then developed left knee swelling and monitored with serial arthrocentesis(25K wbc->8.4K->30K) off of antibiotics. Followed by Dr. [facility] and ID (Dr. Megan Salon). She developed significant pain and had 40K wbc on arthrocentesis with negative Cx(11/30) subsequently admitted or left knee arthroscopy. She was admitted at [facility] 12/12[facility]12/14 and underwent  left knee arthroscopy with synovectomy+ antibiotics beads. Noted to have purulent appearing fluid in th OR. ID was consulted and pt started on daptomycin + ceftriaxone x 4 weeks for Cx negative septic arthritis. Rheumatology referral placed as work up revealed slightly elevated RNP(1) in the setting  of b/l Cx negative septic arthritis of the knees.  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   Complex valvuloplasty including triangular resection of posterior leaflet, artificial Gore-tex neochord placement x6 and 34 mm Memo 3D ring annuloplasty via right mini thoracotomy approach with clipping of LA appendage   Thyroid nodule 01/29/2016   2.4 cm enhancing mixed cystic and solid nodule inferior left thyroid gland noted on CT angiogram    Outpatient Medications Prior to Visit  Medication Sig Dispense Refill   aspirin 325 MG EC tablet Take 325 mg by mouth daily as needed for pain.     cefTRIAXone (ROCEPHIN) IVPB Inject 2 g into the vein daily for 25 days. Indication:  L-knee septic arthritis First Dose: Yes Last Day of Therapy:  01/06/22 Labs - Once weekly:  CBC/D and BMP, Labs - Every other week:  ESR and CRP Method of administration: IV Push Method of administration may be changed at the discretion of home infusion pharmacist based upon assessment of the patient and/or caregiver's ability to self-administer the medication ordered. 25 Units 0   cholecalciferol (VITAMIN D3) 25 MCG (1000 UNIT) tablet Take 1,000 Units by mouth daily.     daptomycin  (CUBICIN) IVPB Inject 500 mg into the vein daily for 25 days. Indication:  L-knee septic arthritis First Dose: Yes Last Day of Therapy:  01/06/22 Labs - Once weekly:  CBC/D, BMP, and CPK Labs - Every other week:  ESR and CRP Method of administration: IV Push Method of administration may be changed at the discretion of home infusion pharmacist based upon assessment of the patient and/or caregiver's ability to self-administer the medication ordered. 25 Units 0   diphenhydrAMINE (BENADRYL) 25 MG tablet Take 25 mg by mouth daily as needed for allergies.     gabapentin (NEURONTIN) 300 MG capsule Take 900 mg by mouth at bedtime.     HYDROcodone-acetaminophen (NORCO/VICODIN) 5-325 MG tablet Take 1 tablet by mouth every 6 (six) hours as needed for moderate pain (pain score 4-6). 30 tablet 0   ibuprofen (ADVIL) 200 MG tablet Take 600 mg by mouth every 8 (eight) hours as needed for moderate pain.     Menthol, Topical Analgesic, ([employer] COLD & HOT MEDICATED EX) Apply 1 application topically at bedtime as needed (pain).     methocarbamol (ROBAXIN) 500 MG tablet Take 1 tablet (500 mg total) by mouth every 8 (eight) hours as needed for muscle spasms. 30 tablet 0   Multiple Vitamins-Minerals (MULTIVITAMIN WITH MINERALS) tablet Take 1 tablet by mouth daily.     No facility-administered medications prior to visit.     Allergies  Allergen Reactions   Vancomycin Rash    Social History   Tobacco Use   Smoking status: Never   Smokeless tobacco: Never  Vaping Use   Vaping Use: Never used  Substance Use Topics   Alcohol use: Yes    Alcohol/week: 10.0 standard drinks    Types: 6 Glasses of wine, 4 Shots of liquor per week    Comment: occasional   Drug use: No    Family History  Problem Relation Age of Onset   Hypertension Mother    Cancer Mother 54       MELANOMA   Cancer Father        PROSTATE   Cancer Sister 23       BREAST    Objective:   Vitals:   12/19/21 1449  BP: (!) 176/92  Pulse: 86   Temp: 98.1 F (36.7 C)  TempSrc: Oral  SpO2: 98%   There is no height or weight on file to calculate BMI.  Physical Exam Constitutional:      Appearance: Normal appearance.  HENT:     Head: Normocephalic and atraumatic.     Right Ear: Tympanic membrane normal.     Left Ear: Tympanic membrane normal.     Nose: Nose normal.     Mouth/Throat:     Mouth: Mucous membranes are moist.  Eyes:     Extraocular Movements: Extraocular movements intact.     Conjunctiva/sclera: Conjunctivae normal.     Pupils: Pupils are equal, round, and reactive to light.  Cardiovascular:     Rate and Rhythm: Normal rate and regular rhythm.     Heart sounds:  No murmur heard.   No friction rub. No gallop.  Pulmonary:     Effort: Pulmonary effort is normal.     Breath sounds: Normal breath sounds.  Abdominal:     General: Abdomen is flat.     Palpations: Abdomen is soft.  Musculoskeletal:        General: Normal range of motion.     Comments: Lateral left knee surgical wound about 3 cm in length C/D/I  Skin:    General: Skin is warm and dry.  Neurological:     General: No focal deficit present.     Mental Status: She is alert and oriented to person, place, and time.  Psychiatric:        Mood and Affect: Mood normal.    Lab Results: Lab Results  Component Value Date   WBC 7.9 11/28/2021   HGB 13.8 11/28/2021   HCT 41.5 11/28/2021   MCV 87.9 11/28/2021   PLT 362 11/28/2021    Lab Results  Component Value Date   CREATININE 0.64 12/10/2021   BUN 11 12/10/2021   NA 139 12/10/2021   K 3.4 (L) 12/10/2021   CL 106 12/10/2021   CO2 24 12/10/2021    Lab Results  Component Value Date   ALT 12 (L) 03/04/2016   AST 19 03/04/2016   ALKPHOS 60 03/04/2016   BILITOT 1.1 03/04/2016     Assessment & Plan:   Problem List Items Addressed This Visit   None  #Left knee septic arthritis SP arthroscopy with debridement and antibiotic beads on  12/10/21 #Hx of right knee Cx negative septic  arthritis -OR Cx negative -Pt is doing well on IV antibiotics -Referral for Rheumatology placed Plan: -Plan to complete daptomycin and ceftriaxone x4 weeks (EOT 01/06/22) -Follow-up with ID in 3 weeks after completing antibiotics  Laurice Record, MD [facility]   12/19/21  2:59 PM

## 2022-01-02 ENCOUNTER — Inpatient Hospital Stay: Payer: Medicare Other | Admitting: Internal Medicine

## 2022-01-02 NOTE — Discharge Summary (Signed)
Physician Discharge Summary      Patient ID: Becky Murphy MRN: [id number] DOB/AGE: [birth year] 71 y.o.  Admit date: 12/10/2021 Discharge date: 12/12/2021  Admission Diagnoses:  Principal Problem:   Septic arthritis of knee, left [facility])   Discharge Diagnoses:  Same  Surgeries: Procedure(s): LEFT KNEE ARTHROSCOPY, DEBRIDEMENT, PLACEMENT OF STIMULAN BEADS on 12/10/2021   Consultants:   Discharged Condition: Stable  Hospital Course: Becky Murphy is an 71 y.o. female who was admitted 12/10/2021 with a chief complaint of left knee pain, and found to have a diagnosis of left knee infection.  They were brought to the operating room on 12/10/2021 and underwent the above named procedures.  Pt awoke from anesthesia without complication and was transferred to the floor. On POD1, patient's pain was controlled and improved compared with prior to procedure.  She was seen by infectious disease during her stay who recommended rheumatology evaluation outpatient setting.  She is able to mobilize well with physical therapy.  Vitals were stable throughout her stay.  PICC line was placed on POD 2 when she was discharged home on POD 2.  IV antibiotics were prescribed by infectious disease.  Pt will f/u with Dr. Marlou Sa in clinic in ~1 wee and continue to follow-up with infectious disease as well per their recommendations..   Antibiotics given:  Anti-infectives (From admission, onward)    Start     Dose/Rate Route Frequency Ordered Stop   12/12/21 0000  cefTRIAXone (ROCEPHIN) IVPB        2 g Intravenous Every 24 hours 12/12/21 1433 01/06/22 2359   12/12/21 0000  daptomycin (CUBICIN) IVPB        500 mg Intravenous Every 24 hours 12/12/21 1433 01/06/22 2359   12/10/21 1930  ceFAZolin (ANCEF) IVPB 1 g/50 mL premix  Status:  Discontinued        1 g 100 mL/hr over 30 Minutes Intravenous Every 8 hours 12/10/21 1358 12/11/21 1022   12/10/21 1800  DAPTOmycin (CUBICIN) 500 mg in sodium chloride 0.9 % IVPB   Status:  Discontinued        500 mg 120 mL/hr over 30 Minutes Intravenous Daily 12/10/21 1645 12/12/21 2333   12/10/21 1700  cefTRIAXone (ROCEPHIN) 2 g in sodium chloride 0.9 % 100 mL IVPB  Status:  Discontinued        2 g 200 mL/hr over 30 Minutes Intravenous Every 24 hours 12/10/21 1606 12/12/21 2333   12/10/21 1153  gentamicin (GARAMYCIN) injection  Status:  Discontinued          As needed 12/10/21 1154 12/10/21 1302   12/10/21 1153  vancomycin (VANCOCIN) powder  Status:  Discontinued          As needed 12/10/21 1154 12/10/21 1302   12/10/21 1130  ceFAZolin (ANCEF) IVPB 2g/100 mL premix  Status:  Discontinued        2 g 200 mL/hr over 30 Minutes Intravenous To Surgery 12/10/21 1126 12/10/21 1357   12/10/21 1115  cefUROXime (ZINACEF) 1.5 g in sodium chloride 0.9 % 100 mL IVPB  Status:  Discontinued        1.5 g 200 mL/hr over 30 Minutes Intravenous To Surgery 12/10/21 1106 12/10/21 1357     .  Recent vital signs:  Vitals:   12/12/21 0508 12/12/21 0748  BP: 118/78 111/85  Pulse: 81 75  Resp: 20 16  Temp: 98 F (36.7 C) 98.7 F (37.1 C)  SpO2: 98% 98%    Recent laboratory studies:  Results for  orders placed or performed during the hospital encounter of 12/10/21  SARS Coronavirus 2 by RT PCR (hospital order, performed in [facility] hospital lab) Nasopharyngeal Nasopharyngeal Swab   Specimen: Nasopharyngeal Swab  Result Value Ref Range   SARS Coronavirus 2 POSITIVE (A) NEGATIVE  Aerobic/Anaerobic Culture w Gram Stain (surgical/deep wound)   Specimen: PATH Cytology Misc. fluid; Body Fluid  Result Value Ref Range   Specimen Description FLUID LEFT KNEE    Special Requests NONE    Gram Stain      ABUNDANT WBC PRESENT,BOTH PMN AND MONONUCLEAR NO ORGANISMS SEEN    Culture      No growth aerobically or anaerobically. Performed at [facility], [place] [address], [place], [place] [address]    Report Status 12/15/2021 FINAL   Basic metabolic panel  Result Value Ref Range    Sodium 139 135 - 145 mmol/L   Potassium 3.4 (L) 3.5 - 5.1 mmol/L   Chloride 106 98 - 111 mmol/L   CO2 24 22 - 32 mmol/L   Glucose, Bld 108 (H) 70 - 99 mg/dL   BUN 11 8 - 23 mg/dL   Creatinine, Ser 0.64 0.44 - 1.00 mg/dL   Calcium 8.7 (L) 8.9 - 10.3 mg/dL   GFR, Estimated >60 >60 mL/min   Anion gap 9 5 - 15  HIV Antibody (routine testing w rflx)  Result Value Ref Range   HIV Screen 4th Generation wRfx Non Reactive Non Reactive  ANA w/Reflex if Positive  Result Value Ref Range   Anti Nuclear Antibody (ANA) Positive (A) Negative  Rheumatoid factor  Result Value Ref Range   Rhuematoid fact SerPl-aCnc <10.0 <14.0 IU/mL  QuantiFERON-TB Gold Plus  Result Value Ref Range   QuantiFERON Incubation Incubation performed.    QuantiFERON-TB Gold Plus Indeterminate (A) Negative  CK  Result Value Ref Range   Total CK 73 38 - 234 U/L  ENA+DNA/DS+ANTICH+CENTRO+JO...  Result Value Ref Range   ds DNA Ab <1 0 - 9 IU/mL   Ribonucleic Protein 1.0 (H) 0.0 - 0.9 AI   ENA SM Ab Ser-aCnc <0.2 0.0 - 0.9 AI   Scleroderma (Scl-70) (ENA) Antibody, IgG <0.2 0.0 - 0.9 AI   SSA (Ro) (ENA) Antibody, IgG <0.2 0.0 - 0.9 AI   SSB (La) (ENA) Antibody, IgG <0.2 0.0 - 0.9 AI   Chromatin Ab SerPl-aCnc <0.2 0.0 - 0.9 AI   Anti JO-1 <0.2 0.0 - 0.9 AI   Centromere Ab Screen <0.2 0.0 - 0.9 AI   See below: Comment   QuantiFERON-TB Gold Plus  Result Value Ref Range   QuantiFERON Criteria Comment    QuantiFERON TB1 Ag Value 0.02 IU/mL   QuantiFERON TB2 Ag Value 0.02 IU/mL   QuantiFERON Nil Value 0.02 IU/mL   QuantiFERON Mitogen Value 0.37 IU/mL    Discharge Medications:   Allergies as of 12/12/2021       Reactions   Vancomycin Rash        Medication List     STOP taking these medications    oxyCODONE 5 MG immediate release tablet Commonly known as: Oxy IR/ROXICODONE       TAKE these medications    aspirin 325 MG EC tablet Take 325 mg by mouth daily as needed for pain.   cefTRIAXone   IVPB Commonly known as: ROCEPHIN Inject 2 g into the vein daily for 25 days. Indication:  L-knee septic arthritis First Dose: Yes Last Day of Therapy:  01/06/22 Labs - Once weekly:  CBC/D and BMP, Labs - Every other week:  ESR and CRP Method of administration: IV Push Method of administration may be changed at the discretion of home infusion pharmacist based upon assessment of the patient and/or caregiver's ability to self-administer the medication ordered.   cholecalciferol 25 MCG (1000 UNIT) tablet Commonly known as: VITAMIN D3 Take 1,000 Units by mouth daily.   [employer] COLD & HOT MEDICATED EX Apply 1 application topically at bedtime as needed (pain).   daptomycin  IVPB Commonly known as: CUBICIN Inject 500 mg into the vein daily for 25 days. Indication:  L-knee septic arthritis First Dose: Yes Last Day of Therapy:  01/06/22 Labs - Once weekly:  CBC/D, BMP, and CPK Labs - Every other week:  ESR and CRP Method of administration: IV Push Method of administration may be changed at the discretion of home infusion pharmacist based upon assessment of the patient and/or caregiver's ability to self-administer the medication ordered.   diphenhydrAMINE 25 MG tablet Commonly known as: BENADRYL Take 25 mg by mouth daily as needed for allergies.   gabapentin 300 MG capsule Commonly known as: NEURONTIN Take 900 mg by mouth at bedtime.   HYDROcodone-acetaminophen 5-325 MG tablet Commonly known as: NORCO/VICODIN Take 1 tablet by mouth every 6 (six) hours as needed for moderate pain (pain score 4-6).   ibuprofen 200 MG tablet Commonly known as: ADVIL Take 600 mg by mouth every 8 (eight) hours as needed for moderate pain.   methocarbamol 500 MG tablet Commonly known as: ROBAXIN Take 1 tablet (500 mg total) by mouth every 8 (eight) hours as needed for muscle spasms.   multivitamin with minerals tablet Take 1 tablet by mouth daily.               Discharge Care Instructions  (From  admission, onward)           Start     Ordered   12/12/21 0000  Change dressing on IV access line weekly and PRN  (Home infusion instructions - [facility] )        12/12/21 1433            Diagnostic Studies: [place] EKG SITE RITE  Result Date: 12/12/2021 If Site Rite image not attached, placement could not be confirmed due to current cardiac rhythm.   Disposition: Discharge disposition: 01-Home or Self Care       Discharge Instructions     [facility] pharmacist to adjust dose for Vancomycin, Aminoglycosides and other anti-infective therapies as requested by physician.   Complete by: As directed    [facility] to provide Cath Flo 26m   Complete by: As directed    Administer for PICC line occlusion and as ordered by physician for other access device issues.   Anaphylaxis Kit: Provided to treat any anaphylactic reaction to the medication being provided to the patient if First Dose or when requested by physician   Complete by: As directed    Epinephrine 152mml vial / amp: Administer 0.40m71m0.40ml91mubcutaneously once for moderate to severe anaphylaxis, nurse to call physician and pharmacy when reaction occurs and call 911 if needed for immediate care   Diphenhydramine 50mg59mIV vial: Administer 25-50mg 340mM PRN for first dose reaction, rash, itching, mild reaction, nurse to call physician and pharmacy when reaction occurs   Sodium Chloride 0.9% NS 500ml I47mdminister if needed for hypovolemic blood pressure drop or as ordered by physician after call to physician with anaphylactic reaction  Call MD / Call 911   Complete by: As directed    If you experience chest pain or shortness of breath, CALL 911 and be transported to the hospital emergency room.  If you develope a fever above 101 F, pus (white drainage) or increased drainage or redness at the wound, or calf pain, call your surgeon's office.   Change dressing on IV access line weekly and PRN    Complete by: As directed    Constipation Prevention   Complete by: As directed    Drink plenty of fluids.  Prune juice may be helpful.  You may use a stool softener, such as Colace (over the counter) 100 mg twice a day.  Use MiraLax (over the counter) for constipation as needed.   Diet - low sodium heart healthy   Complete by: As directed    Discharge instructions   Complete by: As directed    Weight bearing as tolerated with walker if necessary. Use knee immobilizer when ambulatory until you can do 15 straight leg raises in a row Leave the water-proof dressing on.  You may shower with that waterproof dressing on but do not soak in a tub/pool.   Begin straight-leg raise and knee range of motion exercises (30 reps each, 3 times per day) tomorrow Focus on getting your knee to come out as straight as possible.   Use IV antibiotics as directed by home health nurse.  Call the office with any concerns at [phone].  If any concern over rash or reaction to antibiotics, call the infectious disease office for further evaluation.  We will arrange follow-up with Rheumatologist at your follow-up appointment with Dr. Marlou Sa. Follow-up with Dr. Marlou Sa in 7-10 days postoperatively.   Flush IV access with Sodium Chloride 0.9% and Heparin 10 units/ml or 100 units/ml   Complete by: As directed    Home infusion instructions - [facility]   Complete by: As directed    Instructions: Flush IV access with Sodium Chloride 0.9% and Heparin 10units/ml or 100units/ml   Change dressing on IV access line: Weekly and PRN   Instructions Cath Flo 21m: Administer for PICC Line occlusion and as ordered by physician for other access device   [facility] pharmacist to adjust dose for: Vancomycin, Aminoglycosides and other anti-infective therapies as requested by physician   Increase activity slowly as tolerated   Complete by: As directed    Method of administration may be changed at the discretion of home  infusion pharmacist based upon assessment of the patient and/or caregivers ability to self-administer the medication ordered   Complete by: As directed    Post-operative opioid taper instructions:   Complete by: As directed    POST-OPERATIVE OPIOID TAPER INSTRUCTIONS: It is important to wean off of your opioid medication as soon as possible. If you do not need pain medication after your surgery it is ok to stop day one. Opioids include: Codeine, Hydrocodone(Norco, Vicodin), Oxycodone(Percocet, oxycontin) and hydromorphone amongst others.  Long term and even short term use of opiods can cause: Increased pain response Dependence Constipation Depression Respiratory depression And more.  Withdrawal symptoms can include Flu like symptoms Nausea, vomiting And more Techniques to manage these symptoms Hydrate well Eat regular healthy meals Stay active Use relaxation techniques(deep breathing, meditating, yoga) Do Not substitute Alcohol to help with tapering If you have been on opioids for less than two weeks and do not have pain than it is ok to stop all together.  Plan to wean  off of opioids This plan should start within one week post op of your joint replacement. Maintain the same interval or time between taking each dose and first decrease the dose.  Cut the total daily intake of opioids by one tablet each day Next start to increase the time between doses. The last dose that should be eliminated is the evening dose.           Follow-up Information     [facility] Follow up.   Specialty: Home Health Services Contact information: [address] [place] [address] [place] [address] [phone]         [facility] Follow up.   Why: [place] 01/02/2022, 8:31 AM

## 2022-01-03 ENCOUNTER — Telehealth: Payer: Self-pay

## 2022-01-03 NOTE — Telephone Encounter (Signed)
LVM with [facility] for pull PICC orders per Dr. Candiss Norse. Thanks!

## 2022-01-03 NOTE — Telephone Encounter (Signed)
Patient called stating she did not have enough IV antibiotics until 01/06/22. I spoke with [facility] and they have shipped out additional medications for patient today. Per [facility] they are also needing pull picc orders or is patient to maintain picc until follow up appointment

## 2022-01-14 ENCOUNTER — Encounter: Payer: Medicare Other | Admitting: Orthopedic Surgery

## 2022-01-16 ENCOUNTER — Ambulatory Visit (INDEPENDENT_AMBULATORY_CARE_PROVIDER_SITE_OTHER): Payer: Medicare Other | Admitting: Internal Medicine

## 2022-01-16 ENCOUNTER — Other Ambulatory Visit: Payer: Self-pay

## 2022-01-16 ENCOUNTER — Encounter: Payer: Self-pay | Admitting: Internal Medicine

## 2022-01-16 VITALS — BP 141/93 | HR 89 | Temp 98.3°F | Ht 66.0 in | Wt 146.0 lb

## 2022-01-16 DIAGNOSIS — M009 Pyogenic arthritis, unspecified: Secondary | ICD-10-CM | POA: Diagnosis present

## 2022-01-16 NOTE — Progress Notes (Signed)
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   CHOLECALCIFEROL (VITAMIN D3) 25 MCG (1000 UNIT) TABLET    Take 1,000 Units by mouth daily.   DIPHENHYDRAMINE (BENADRYL) 25 MG TABLET    Take 25 mg by mouth daily as needed for allergies.   GABAPENTIN (NEURONTIN) 300 MG CAPSULE    Take 900 mg by mouth at bedtime.   HYDROCODONE-ACETAMINOPHEN (NORCO/VICODIN) 5-325 MG TABLET    Take 1 tablet by mouth every 6 (six) hours as needed for moderate pain (pain score 4-6).   IBUPROFEN (ADVIL) 200 MG TABLET    Take 600 mg by mouth every 8 (eight) hours as needed for moderate pain.   MENTHOL, TOPICAL ANALGESIC, ([employer] COLD & HOT MEDICATED EX)    Apply 1 application topically at bedtime as needed (pain).   METHOCARBAMOL (ROBAXIN) 500 MG TABLET    Take 1 tablet (500 mg total) by mouth every 8 (eight) hours as needed for muscle spasms.   MULTIPLE VITAMINS-MINERALS (MULTIVITAMIN WITH MINERALS) TABLET    Take 1 tablet by mouth daily.  Modified Medications   No medications on file  Discontinued Medications   No medications on file   HPI: Becky Murphy is a 71 y.o. F presents for hospital follow up of left knee septic arthritis.  Sh has a past medical history of MVP SP mitral  valve repair in 2017, GERD, depression, two episodes of right knee Cx negative septic arthritis  treated with vancomycin x4 weeks in April 2021, then synovectomy and antibiotic bead placement followed by vanc+ ceftriaxone(about 4 days)->ceftriaxone+linezolid(developed rash to vancomycin)  x4 weeks in April 2022.Then developed left knee swelling and monitored with serial arthrocentesis(25K wbc->8.4K->30K) off of antibiotics. Followed by [facility] and ID (Dr. Orvan Falconer). She developed significant pain and had 40K wbc on arthrocentesis with negative Cx(11/30) subsequently admitted or left knee arthroscopy. She was admitted at [facility] 12/12-12/14 and underwent  left knee arthroscopy with synovectomy+ antibiotics beads. Noted to have purulent appearing fluid in th OR. ID was consulted and pt started on daptomycin + ceftriaxone x 4 weeks for Cx negative septic arthritis. Rheumatology referral placed as work up revealed slightly elevated RNP(1) in the setting of b/l Cx negative septic arthritis of the knees.  Today: She completed 4 weeks of antibiotics on 01/06/22. Still using the cane, but reports knee feels better. Denies fever, chills.     Review of Systems: Review of Systems  All other systems reviewed and are negative.  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   Pancreatic mass 01/29/2016   2.8 cm enhancing mass in the pancreatic neck and 3.1 cm low-attenuation cystic lesion in body of pancreas noted on  CT angiogram   PONV (postoperative nausea and vomiting)    S/P minimally invasive mitral valve repair 03/06/2016   Complex valvuloplasty including triangular resection of posterior leaflet, artificial Gore-tex neochord placement x6 and 34 mm Memo 3D ring annuloplasty via right mini thoracotomy approach with clipping of LA appendage   Thyroid nodule 01/29/2016   2.4 cm enhancing mixed cystic and solid nodule inferior left thyroid gland noted on CT angiogram    Social History   Tobacco Use   Smoking status: Never   Smokeless tobacco: Never  Vaping Use   Vaping Use: Never used  Substance Use Topics   Alcohol use: Yes    Alcohol/week: 10.0 standard drinks    Types: 6 Glasses of wine, 4 Shots of liquor per week    Comment: occasional   Drug use: No    Family History  Problem Relation Age of Onset   Hypertension Mother    Cancer Mother 32       MELANOMA   Cancer Father        PROSTATE   Cancer Sister 70       BREAST    Allergies  Allergen Reactions   Vancomycin Rash    Health Maintenance  Topic Date Due   COVID-19 Vaccine (1) Never done   Hepatitis C Screening  Never done   TETANUS/TDAP  Never done   COLONOSCOPY (Pts 45-42yrs Insurance coverage will need to be confirmed)  Never done   Zoster Vaccines- Shingrix (1 of 2) Never done   MAMMOGRAM  08/07/2006   Pneumonia Vaccine 58+ Years old (1 - PCV) Never done   DEXA SCAN  Never done   INFLUENZA VACCINE  Never done   HPV VACCINES  Aged Out    Objective:  Vitals:   01/16/22 1106  BP: (!) 141/93  Pulse: 89  Temp: 98.3 F (36.8 C)  TempSrc: Oral  SpO2: 97%  Weight: 146 lb (66.2 kg)  Height: 5\' 6"  (1.676 m)   Body mass index is 23.57 kg/m.  Physical Exam Constitutional:      Appearance: Normal appearance.  HENT:     Head: Normocephalic and atraumatic.     Right Ear: Tympanic membrane normal.     Left Ear: Tympanic membrane normal.     Nose: Nose normal.     Mouth/Throat:     Mouth: Mucous membranes are  moist.  Eyes:     Extraocular Movements: Extraocular movements intact.     Conjunctiva/sclera: Conjunctivae normal.     Pupils: Pupils are equal, round, and reactive to light.  Cardiovascular:     Rate and Rhythm: Normal rate and regular rhythm.     Heart sounds: No murmur heard.   No friction rub. No gallop.  Pulmonary:     Effort: Pulmonary effort is normal.     Breath sounds: Normal breath sounds.  Abdominal:     General: Abdomen is  flat.     Palpations: Abdomen is soft.  Musculoskeletal:        General: Normal range of motion.     Comments: Left knee wound without drainage,   Skin:    General: Skin is warm and dry.  Neurological:     General: No focal deficit present.     Mental Status: She is alert and oriented to person, place, and time.  Psychiatric:        Mood and Affect: Mood normal.    Lab Results Lab Results  Component Value Date   WBC 7.9 11/28/2021   HGB 13.8 11/28/2021   HCT 41.5 11/28/2021   MCV 87.9 11/28/2021   PLT 362 11/28/2021    Lab Results  Component Value Date   CREATININE 0.64 12/10/2021   BUN 11 12/10/2021   NA 139 12/10/2021   K 3.4 (L) 12/10/2021   CL 106 12/10/2021   CO2 24 12/10/2021    Lab Results  Component Value Date   ALT 12 (L) 03/04/2016   AST 19 03/04/2016   ALKPHOS 60 03/04/2016   BILITOT 1.1 03/04/2016    No results found for: CHOL, HDL, LDLCALC, LDLDIRECT, TRIG, CHOLHDL No results found for: LABRPR, RPRTITER No results found for: HIV1RNAQUANT, HIV1RNAVL, CD4TABS  Assessment/Plan   #Left knee septic arthritis SP arthroscopy with debridement and antibiotic beads on  12/10/21 #Hx of right knee Cx negative septic arthritis -Completed 4 weeks of antibiotics on 01/06/22 with daptomycin and ceftriaxone -No concern for infection at this visit Recommendations: -Continue to follow with ortho -Referral for Rheumatology placed, pt plans to schedule an appointment -Follow-up with ID in 6 months or sooner if needed.     Danelle EarthlyMayanka Chelcy Bolda, MD [facility] 01/16/2022, 11:18 AM

## 2022-01-17 ENCOUNTER — Ambulatory Visit (INDEPENDENT_AMBULATORY_CARE_PROVIDER_SITE_OTHER): Payer: Medicare Other | Admitting: Orthopedic Surgery

## 2022-01-17 DIAGNOSIS — M25462 Effusion, left knee: Secondary | ICD-10-CM

## 2022-01-17 NOTE — Progress Notes (Signed)
Post-Op Visit Note   Patient: Becky Murphy           Date of Birth: 08-22-1951           MRN: [id number] Visit Date: 01/17/2022 PCP: Kaleen Mask, MD   Assessment & Plan:  Chief Complaint:  Chief Complaint  Patient presents with   Left Knee - Routine Post Op    12/10/21 (5w 3d) left Knee Arthroscopy, Debridement, Placement Of Stimulan Bead     Visit Diagnoses:  1. Effusion, left knee     Plan: Patient is a 71 year old female who presents s/p left knee arthroscopy with irrigation and debridement and placement of stimulant beads on 12/10/2021.  Patient reports that she is doing very well and feels much improved compared with prior to surgery.  She has had no recurrent fevers, chills, effusion.  Only has to take occasional ibuprofen for pain control.  She finished home health physical therapy on Thursday and feels her mobility is much improved and she is able to move around the house and do her ADLs than she has done in months according to her husband.  On exam she has incisions that are well-healed.  No knee effusion.  No significant warmth over the knee.  No calf tenderness.  Negative Homans' sign.  She has range of motion from 0 to 115 degrees.  Excellent quad strength rated 5/5.  Able to form straight leg raise.  She ambulates with a slow measured gait but is able to walk with fairly good balance without a cane.  She is walking with a cane most of the time.  She has rheumatology appointment in February that we set up at her last appointment.  PICC line is removed.  She is off antibiotics now.  Plan is continue with home excise program from home health physical therapy and continue monitoring for symptoms.  She will follow-up with rheumatology and then with [place] as needed..  Patient agreed with this plan.  Follow-Up Instructions: No follow-ups on file.   Orders:  No orders of the defined types were placed in this encounter.  No orders of the defined types were placed in  this encounter.   Imaging: No results found.  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   Mitral insufficiency    Mitral valve prolapse 12/26/2015   Anxiety 11/02/2015   Past Medical History:  Diagnosis Date   Anemia    low iron in the past   Anxiety    pt denies   Arthritis    "back?" (11/03/2015)   Chest pain 11/02/2015   Depression    pt denies   Exertional dyspnea 01/05/2016   GERD (gastroesophageal reflux disease)    Heart murmur    History of peptic ulcer "late 1970's"   Migraine    "maybe 3-4/yr now" (11/03/2015)   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   Pancreatic mass  01/29/2016   2.8 cm enhancing mass in the pancreatic neck and 3.1 cm low-attenuation cystic lesion in body of pancreas noted on CT angiogram   PONV (postoperative nausea and vomiting)    S/P minimally invasive mitral valve repair 03/06/2016   Complex valvuloplasty including triangular resection of posterior leaflet, artificial Gore-tex neochord placement x6 and 34 mm Memo 3D ring annuloplasty via right mini thoracotomy approach with clipping of LA appendage   Thyroid nodule 01/29/2016   2.4 cm enhancing mixed cystic and solid nodule inferior left thyroid gland noted on CT angiogram    Family History  Problem Relation Age of Onset   Hypertension Mother    Cancer Mother 40        MELANOMA   Cancer Father        PROSTATE   Cancer Sister 64       BREAST    Past Surgical History:  Procedure Laterality Date   BACK SURGERY     BREAST BIOPSY Left ~ 2005   BREAST LUMPECTOMY Left ~ 2005   CARDIAC CATHETERIZATION N/A 01/16/2016   Procedure: Right/Left Heart Cath and Coronary Angiography;  Surgeon: Thurmon Fair, MD;  Location: [facility]ASIVE [facility];  Service: Cardiovascular;  Laterality: N/A;   CLIPPING OF ATRIAL APPENDAGE Left 03/06/2016   Procedure: CLIPPING OF LEFT ATRIAL APPENDAGE using a 45 PRO2 AtriClip;  Surgeon: Purcell Nails, MD;  Location: [facility] OR;  Service: Open Heart Surgery;  Laterality: Left;   COLONOSCOPY     EUS N/A 02/20/2016   Procedure: ESOPHAGEAL ENDOSCOPIC ULTRASOUND (EUS) RADIAL;  Surgeon: Willis Modena, MD;  Location: [facility] ENDOSCOPY;  Service: Endoscopy;  Laterality: N/A;   EYE MUSCLE SURGERY Bilateral 1970's?   KNEE ARTHROSCOPY Right 04/04/2020   Procedure: RIGHT KNEE ARTHROSCOPY WITH DEBRIDEMENT;  Surgeon: Cammy Copa, MD;  Location: [facility] OR;  Service: Orthopedics;  Laterality: Right;   KNEE ARTHROSCOPY Right 04/10/2021   Procedure: right knee arthroscopy, debridement, placement of antibiotic beads;  Surgeon: Cammy Copa, MD;  Location: [facility] OR;  Service: Orthopedics;  Laterality: Right;   KNEE ARTHROSCOPY Left 12/10/2021   Procedure: LEFT KNEE ARTHROSCOPY, DEBRIDEMENT, PLACEMENT OF STIMULAN BEADS;  Surgeon: Cammy Copa, MD;  Location: [facility] OR;  Service: Orthopedics;  Laterality: Left;   LUMBAR DISC SURGERY  1992   "trimmed bulges off both sides"   MITRAL VALVE REPAIR Right 03/06/2016   Procedure: MINIMALLY INVASIVE MITRAL VALVE REPAIR (MVR) using a 34 Sorin Memo 3D Ring;  Surgeon: Purcell Nails, MD;  Location: [facility] OR;  Service: Open Heart Surgery;  Laterality: Right;   TEE WITHOUT CARDIOVERSION N/A 01/05/2016   Procedure: TRANSESOPHAGEAL ECHOCARDIOGRAM (TEE);  Surgeon: Thurmon Fair, MD;  Location[facility] ENDOSCOPY;  Service: Cardiovascular;   Laterality: N/A;   TEE WITHOUT CARDIOVERSION N/A 03/06/2016   Procedure: TRANSESOPHAGEAL ECHOCARDIOGRAM (TEE);  Surgeon: Purcell Nails, MD;  Location: [facility] OR;  Service: Open Heart Surgery;  Laterality: N/A;   TUBAL LIGATION     Social History   Occupational History   Not on file  Tobacco Use   Smoking status: Never   Smokeless tobacco: Never  Vaping Use   Vaping Use: Never used  Substance and Sexual Activity   Alcohol use: Yes    Alcohol/week: 10.0 standard drinks    Types: 6 Glasses of wine, 4 Shots of liquor per week    Comment: occasional   Drug use: No   Sexual activity: Not Currently    Birth control/protection: Post-menopausal

## 2022-01-20 ENCOUNTER — Encounter: Payer: Self-pay | Admitting: Orthopedic Surgery

## 2022-02-07 NOTE — Progress Notes (Signed)
Office Visit Note  Patient: Becky Murphy             Date of Birth: 14-Apr-1951           MRN: [id number]             PCP: Leonard Downing, MD Referring: Donella Stade, PA-C Visit Date: 02/08/2022   Subjective:  New Patient (Initial Visit) (Bil knee pain, bil wrist nodules)   History of Present Illness: Becky Murphy is a 71 y.o. female here for evaluation of recurrent bilateral knee effusions. These started about 2 years ago affecting the right knee. She has had repeated episodes of suspected infectious knee effusions with negative cultures. Treatment has included drainage, with subsequent surgical debridement twice in the right knee which improved symptoms a lot but never entirely to normal. Medications started with initial vancomycin treatment in April 2021. She suffered a reaction with rash developing that persists on her outer legs. Repeat antibiotics afterwards and again in April 2022 after additional surgery. She developed left knee effusion initially treated with serial aspiration and then left knee arthroscopy and debridement in December. MRI imaging of the knee and thigh demonstrated significant synovitis and possible hemarthrosis. Currently her left knee is swollen and hot with pain and decreased range of movement. She is ambulatory with a walker at home and using wheelchair for long distance travel. She has noticed significant loss of upper leg strength throughout this process. In addition to knee symptoms she has developed bilateral wrist nodules and pain she attributes to pressure having to transfer and stand using upper body due to leg pain and weakness. She has no past arthritis history before this started about 2 years ago. She has psoriasis typically affecting her upper torso not severe and not on long term medication for this. 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 She had mitral valve  replacement by thoracotomy in 2017 without infection or inflammatory pathology noted.  Labs reviewed 11/2021 ANA pos RNP 1.0  10/2021 ESR 31  Synopvial fluid repeat aspirations 02/2021-10/2021 WBCs 8000-45000 Neutrophils 58-95% Crystals negative Microbiology negative  11/30/2021 MRI left femur IMPRESSION: Large heterogeneous left knee joint effusion, partially visualized, with thick synovial enhancement suggestive of synovitis, possibly hemarthrosis. Intramuscular edema within the distal vastus medialis muscle, likely reactive. If clinically indicated, a dedicated left knee MRI may be useful for further evaluation.  Activities of Daily Living:  Patient reports morning stiffness for 24 hours.   Patient Reports nocturnal pain.  Difficulty dressing/grooming: Reports Difficulty climbing stairs: Reports Difficulty getting out of chair: Reports Difficulty using hands for taps, buttons, cutlery, and/or writing: Reports  Review of Systems  Constitutional:  Positive for fatigue.  HENT:  Positive for mouth dryness.   Eyes:  Negative for dryness.  Respiratory:  Negative for shortness of breath.   Cardiovascular:  Negative for swelling in legs/feet.  Gastrointestinal:  Negative for constipation.  Endocrine: Negative for excessive thirst.  Genitourinary:  Negative for difficulty urinating.  Musculoskeletal:  Positive for joint pain, gait problem, joint pain, joint swelling, muscle weakness, morning stiffness and muscle tenderness.  Skin:  Positive for nodules/bumps.  Allergic/Immunologic: Negative for susceptible to infections.  Neurological:  Positive for numbness and weakness.  Hematological:  Positive for bruising/bleeding tendency.  Psychiatric/Behavioral:  Positive for sleep disturbance.    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   Mitral insufficiency    Mitral valve prolapse 12/26/2015   Anxiety 11/02/2015    Past Medical History:  Diagnosis Date   Anemia    low iron in the past   Anxiety    pt denies   Arthritis    "back?" (11/03/2015)   Chest pain 11/02/2015   Depression    pt denies   Exertional dyspnea 01/05/2016   GERD (gastroesophageal reflux disease)    Heart murmur    History of peptic ulcer "late 1970's"   Migraine    "maybe 3-4/yr now" (11/03/2015)   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   Complex valvuloplasty including triangular resection of posterior leaflet, artificial Gore-tex neochord placement x6 and 34 mm Memo 3D ring annuloplasty via right mini thoracotomy approach with clipping of LA appendage   Thyroid nodule 01/29/2016   2.4 cm enhancing mixed cystic and solid nodule inferior left thyroid gland noted on CT angiogram    Family History  Problem Relation Age of Onset   Hypertension Mother    Cancer Mother 38       MELANOMA   Cancer Father        PROSTATE   Cancer Sister 62       BREAST   Past Surgical History:  Procedure Laterality Date   BACK SURGERY     BREAST BIOPSY Left  ~ 2005   BREAST LUMPECTOMY Left ~ 2005   CARDIAC CATHETERIZATION N/A 01/16/2016   Procedure: Right/Left Heart Cath and Coronary Angiography;  Surgeon: Sanda Klein, MD;  Location: [facility];  Service: Cardiovascular;  Laterality: N/A;   CLIPPING OF ATRIAL APPENDAGE Left 03/06/2016   Procedure: CLIPPING OF LEFT ATRIAL APPENDAGE using a 39 PRO2 AtriClip;  Surgeon: Rexene Alberts, MD;  Location: [place];  Service: Open Heart Surgery;  Laterality: Left;   COLONOSCOPY     EUS N/A 02/20/2016   Procedure: ESOPHAGEAL ENDOSCOPIC ULTRASOUND (EUS) RADIAL;  Surgeon: Arta Silence, MD;  Location: [facility];  Service: Endoscopy;  Laterality: N/A;   EYE MUSCLE SURGERY Bilateral 1970's?   KNEE ARTHROSCOPY Right 04/04/2020   Procedure: RIGHT KNEE ARTHROSCOPY WITH DEBRIDEMENT;  Surgeon: Meredith Pel, MD;  Location: [place];  Service: Orthopedics;  Laterality: Right;   KNEE ARTHROSCOPY Right 04/10/2021   Procedure: right knee arthroscopy, debridement, placement of antibiotic beads;  Surgeon: Meredith Pel, MD;  Location: [place];  Service: Orthopedics;  Laterality: Right;   KNEE ARTHROSCOPY Left 12/10/2021   Procedure: LEFT KNEE ARTHROSCOPY, DEBRIDEMENT, PLACEMENT OF STIMULAN BEADS;  Surgeon: Meredith Pel, MD;  Location: [place];  Service: Orthopedics;  Laterality: Left;   LUMBAR  [place]   "trimmed bulges off both sides"   MITRAL VALVE REPAIR Right 03/06/2016   Procedure: MINIMALLY INVASIVE MITRAL VALVE REPAIR (MVR) using a 68 Sorin Memo 3D Ring;  Surgeon: Rexene Alberts, MD;  Location: [place];  Service: Open Heart Surgery;  Laterality: Right;   TEE WITHOUT CARDIOVERSION N/A 01/05/2016   Procedure: TRANSESOPHAGEAL ECHOCARDIOGRAM (TEE);  Surgeon: Sanda Klein, MD;  Location: [facility];  Service: Cardiovascular;  Laterality: N/A;   TEE WITHOUT CARDIOVERSION N/A 03/06/2016   Procedure: TRANSESOPHAGEAL ECHOCARDIOGRAM (TEE);  Surgeon: Rexene Alberts, MD;  Location: [place];  Service:  Open Heart Surgery;  Laterality: N/A;   TUBAL LIGATION     Social History   Social History Narrative   Not on file    There is no immunization history on file for this patient.   Objective: Vital Signs: BP 125/85 (BP Location: Left Arm, Patient Position: Sitting, Cuff Size: Normal)    Pulse 84    Resp 16    Physical Exam HENT:     Right Ear: External ear normal.     Left Ear: External ear normal.     Mouth/Throat:     Mouth: Mucous membranes are moist.     Pharynx: Oropharynx is clear.  Eyes:     Conjunctiva/sclera: Conjunctivae normal.  Cardiovascular:     Rate and Rhythm: Normal rate and regular rhythm.  Pulmonary:     Effort: Pulmonary effort is normal.     Breath sounds: Normal breath sounds.  Musculoskeletal:     Right lower leg: No edema.     Left lower leg: No edema.  Skin:    General: Skin is warm and dry.     Findings: No lesion.     Comments: Nail dystrophy on fingers and on great toes Skin rash on outer leg distal area  Neurological:     General: No focal deficit present.  Psychiatric:        Mood and Affect: Mood normal.    Musculoskeletal Exam:  Neck full ROM no tenderness Shoulders full ROM no tenderness or swelling Elbows full ROM no tenderness or swelling Large right and smaller left wrist ganglion cysts over FCR tendons, mildly tender to pressure ROM is intact Bilateral thumb OA changes Bilateral knee swelling, worse on left with hot to touch decreased extension bilaterally and tender  Ankles full ROM no tenderness or swelling   Investigation: No additional findings.  Imaging: No results found.  Recent Labs: Lab Results  Component Value Date   WBC 7.9 11/28/2021   HGB 13.8 11/28/2021   PLT 362 11/28/2021   NA 139 12/10/2021   K 3.4 (L) 12/10/2021   CL 106 12/10/2021   CO2 24 12/10/2021   GLUCOSE 108 (H) 12/10/2021   BUN 11 12/10/2021   CREATININE 0.64 12/10/2021   BILITOT 1.1 03/04/2016   ALKPHOS 60 03/04/2016   AST 19  03/04/2016   ALT 12 (L) 03/04/2016   PROT 6.7 03/04/2016   ALBUMIN 4.3 03/04/2016   CALCIUM 8.7 (L) 12/10/2021   GFRAA >60 04/04/2020   QFTBGOLDPLUS Indeterminate (A) 12/10/2021    Speciality Comments: No specialty comments available.  Procedures:  No procedures performed Allergies: Vancomycin   Assessment / Plan:     Visit Diagnoses: Pyogenic arthritis of left knee joint, due to unspecified organism (HCC)  Positive ANA (antinuclear antibody)  Synovitis of right knee Synovitis of left knee - Plan: Sedimentation rate, C-reactive protein, Iron, TIBC and Ferritin Panel, QuantiFERON-TB  Gold Plus, IgG, IgA, IgM, ANA, Cyclic citrul peptide antibody, IgG  Outside of arthritis not much systemic clinical findings to indicate connective tissue disease.  Now with bilateral knee effusions though much more suggestive for autoimmune than infectious or traumatic process.  Repeating serum inflammatory markers that were only mildly elevated months ago.  Rechecking the indeterminant previous QuantiFERON though she has no exposure history.  Checking CCP antibody trying to rule out RA fully.  History of skin psoriasis does has some very mild rashes and nail dystrophy at this time but cannot fully exclude this as a cause for seronegative arthritis.  If there is now a very low concern for active infection would consider trial of corticosteroids to see the response, considering longer-term treatment such as sulfasalazine or if needed TNF inhibitor.  Long term (current) use of anticoagulants Hemarthrosis  Not sure about cause of somewhat bloody effusions and possible hemarthrosis on imaging. At this point also hard to exclude changes from surgeries and extensive synovial hypertrophy with the chronic infection.   Orders: Orders Placed This Encounter  Procedures   Sedimentation rate   C-reactive protein   Iron, TIBC and Ferritin Panel   QuantiFERON-TB Gold Plus   IgG, IgA, IgM   ANA   Cyclic citrul  peptide antibody, IgG   No orders of the defined types were placed in this encounter.   Follow-Up Instructions: Return in about 3 weeks (around 03/01/2022) for New pt knee effusions f/u 2-3wks.   Collier Salina, MD  Note - This record has been created using [employer].  Chart creation errors have been sought, but may not always  have been located. Such creation errors do not reflect on  the standard of medical care.

## 2022-02-08 ENCOUNTER — Other Ambulatory Visit: Payer: Self-pay

## 2022-02-08 ENCOUNTER — Encounter: Payer: Self-pay | Admitting: Internal Medicine

## 2022-02-08 ENCOUNTER — Ambulatory Visit (INDEPENDENT_AMBULATORY_CARE_PROVIDER_SITE_OTHER): Payer: Medicare Other | Admitting: Internal Medicine

## 2022-02-08 VITALS — BP 125/85 | HR 84 | Resp 16

## 2022-02-08 DIAGNOSIS — M2500 Hemarthrosis, unspecified joint: Secondary | ICD-10-CM

## 2022-02-08 DIAGNOSIS — R768 Other specified abnormal immunological findings in serum: Secondary | ICD-10-CM | POA: Insufficient documentation

## 2022-02-08 DIAGNOSIS — Z7901 Long term (current) use of anticoagulants: Secondary | ICD-10-CM

## 2022-02-08 DIAGNOSIS — M659 Synovitis and tenosynovitis, unspecified: Secondary | ICD-10-CM

## 2022-02-08 DIAGNOSIS — M009 Pyogenic arthritis, unspecified: Secondary | ICD-10-CM

## 2022-02-13 LAB — ANA: Anti Nuclear Antibody (ANA): POSITIVE — AB

## 2022-02-13 LAB — QUANTIFERON-TB GOLD PLUS
Mitogen-NIL: 5.26 IU/mL
NIL: 0.02 IU/mL
QuantiFERON-TB Gold Plus: NEGATIVE
TB1-NIL: 0 IU/mL
TB2-NIL: 0 IU/mL

## 2022-02-13 LAB — IGG, IGA, IGM
IgG (Immunoglobin G), Serum: 797 mg/dL (ref 600–1540)
IgM, Serum: 40 mg/dL — ABNORMAL LOW (ref 50–300)
Immunoglobulin A: 284 mg/dL (ref 70–320)

## 2022-02-13 LAB — IRON,TIBC AND FERRITIN PANEL
%SAT: 6 % (calc) — ABNORMAL LOW (ref 16–45)
Ferritin: 122 ng/mL (ref 16–288)
Iron: 14 ug/dL — ABNORMAL LOW (ref 45–160)
TIBC: 229 mcg/dL (calc) — ABNORMAL LOW (ref 250–450)

## 2022-02-13 LAB — ANTI-NUCLEAR AB-TITER (ANA TITER)
ANA TITER: 1:80 {titer} — ABNORMAL HIGH
ANA Titer 1: 1:80 {titer} — ABNORMAL HIGH

## 2022-02-13 LAB — SEDIMENTATION RATE: Sed Rate: 41 mm/h — ABNORMAL HIGH (ref 0–30)

## 2022-02-13 LAB — CYCLIC CITRUL PEPTIDE ANTIBODY, IGG: Cyclic Citrullin Peptide Ab: <16 UNITS

## 2022-02-13 LAB — C-REACTIVE PROTEIN: CRP: 296.4 mg/L — ABNORMAL HIGH (ref <8.0)

## 2022-02-27 ENCOUNTER — Encounter: Payer: Self-pay | Admitting: Internal Medicine

## 2022-02-27 ENCOUNTER — Ambulatory Visit (INDEPENDENT_AMBULATORY_CARE_PROVIDER_SITE_OTHER): Payer: Medicare Other | Admitting: Internal Medicine

## 2022-02-27 ENCOUNTER — Other Ambulatory Visit: Payer: Self-pay

## 2022-02-27 VITALS — BP 129/85 | HR 82 | Ht 66.0 in | Wt 146.0 lb

## 2022-02-27 DIAGNOSIS — M659 Synovitis and tenosynovitis, unspecified: Secondary | ICD-10-CM | POA: Diagnosis not present

## 2022-02-27 DIAGNOSIS — M25532 Pain in left wrist: Secondary | ICD-10-CM

## 2022-02-27 DIAGNOSIS — M25531 Pain in right wrist: Secondary | ICD-10-CM

## 2022-02-27 DIAGNOSIS — R768 Other specified abnormal immunological findings in serum: Secondary | ICD-10-CM | POA: Diagnosis not present

## 2022-02-27 MED ORDER — SULFASALAZINE 500 MG PO TABS: 500.0000 mg | ORAL_TABLET | Freq: Four times a day (QID) | ORAL | 1 refills | Status: DC

## 2022-02-27 NOTE — Patient Instructions (Addendum)
I recommend starting sulfasalazine and increasing dose step by step by 1 tablet per day each week up to 4 tablets per day in 1 month. ? ?Sulfasalazine tablets ?What is this medication? ?SULFASALAZINE (sul fa SAL a zeen) is used to treat ulcerative colitis. ?This medicine may be used for other purposes; ask your health care provider or pharmacist if you have questions. ?COMMON BRAND NAME(S): Azulfidine, Sulfazine ?What should I tell my care team before I take this medication? ?They need to know if you have any of these conditions: ?asthma ?blood disorders or anemia ?glucose-6-phosphate dehydrogenase (G6PD) deficiency ?intestinal obstruction ?kidney disease ?liver disease ?porphyria ?urinary tract obstruction ?an unusual reaction to sulfasalazine, sulfa drugs, salicylates, or other medicines, foods, dyes, or preservatives ?pregnant or trying to get pregnant ?breast-feeding ?How should I use this medication? ?Take this medicine by mouth with a full glass of water. Follow the directions on the prescription label. If the medicine upsets your stomach, take it with food or milk. Take your medicine at regular intervals. Do not take your medicine more often than directed. Do not stop taking except on your doctor's advice. ?Talk to your pediatrician regarding the use of this medicine in children. While this drug may be prescribed for children as young as 6 years for selected conditions, precautions do apply. ?Patients over 4 years old may have a stronger reaction and need a smaller dose. ?Overdosage: If you think you have taken too much of this medicine contact a poison control center or emergency room at once. ?NOTE: This medicine is only for you. Do not share this medicine with others. ?What if I miss a dose? ?If you miss a dose, take it as soon as you can. If it is almost time for your next dose, take only that dose. Do not take double or extra doses. ?What may interact with this medication? ?digoxin ?folic acid ?This  list may not describe all possible interactions. Give your health care provider a list of all the medicines, herbs, non-prescription drugs, or dietary supplements you use. Also tell them if you smoke, drink alcohol, or use illegal drugs. Some items may interact with your medicine. ?What should I watch for while using this medication? ?Visit your doctor or health care professional for regular checks on your progress. You will need frequent blood and urine checks. ?This medicine can make you more sensitive to the sun. Keep out of the sun. If you cannot avoid being in the sun, wear protective clothing and use sunscreen. Do not use sun lamps or tanning beds/booths. ?Drink plenty of water while taking this medicine. ?What side effects may I notice from receiving this medication? ?Side effects that you should report to your doctor or health care professional as soon as possible: ?allergic reactions like skin rash, itching or hives, swelling of the face, lips, or tongue ?fever, chills, or any other sign of infection ?painful, difficult, or reduced urination ?redness, blistering, peeling or loosening of the skin, including inside the mouth ?severe stomach pain ?unusual bleeding or bruising ?unusually weak or tired ?yellowing of the skin or eyes ?Side effects that usually do not require medical attention (report to your doctor or health care professional if they continue or are bothersome): ?headache ?loss of appetite ?nausea, vomiting ?orange color to the urine ?reduced sperm count ?This list may not describe all possible side effects. Call your doctor for medical advice about side effects. You may report side effects to FDA at 1-800-FDA-1088. ?Where should I keep my medication? ?Keep out  of the reach of children. ?Store at room temperature between 15 and 30 degrees C (59 and 86 degrees F). Keep container tightly closed. Throw away any unused medicine after the expiration date. ?NOTE: This sheet is a summary. It may not  cover all possible information. If you have questions about this medicine, talk to your doctor, pharmacist, or health care provider. ?? 2022 Elsevier/Gold Standard (2008-09-02 00:00:00) ? ?

## 2022-02-27 NOTE — Progress Notes (Signed)
Office Visit Note  Patient: Becky Murphy             Date of Birth: Dec 04, 1951           MRN: [id number]             PCP: Leonard Downing, MD Referring: Leonard Downing, * Visit Date: 02/27/2022   Subjective:   History of Present Illness: Becky Murphy is a 71 y.o. female here for follow up for bilateral recurrent knee effusions and pain. Labs at initial visit showing very high CRP 296.4 and low iron and TIBC. She continues to have significant amounts of pain and swelling limiting her mobility.  Previous HPI 02/08/22 Becky Murphy is a 71 y.o. female here for evaluation of recurrent bilateral knee effusions. These started about 2 years ago affecting the right knee. She has had repeated episodes of suspected infectious knee effusions with negative cultures. Treatment has included drainage, with subsequent surgical debridement twice in the right knee which improved symptoms a lot but never entirely to normal. Medications started with initial vancomycin treatment in April 2021. She suffered a reaction with rash developing that persists on her outer legs. Repeat antibiotics afterwards and again in April 2022 after additional surgery. She developed left knee effusion initially treated with serial aspiration and then left knee arthroscopy and debridement in December. MRI imaging of the knee and thigh demonstrated significant synovitis and possible hemarthrosis. Currently her left knee is swollen and hot with pain and decreased range of movement. She is ambulatory with a walker at home and using wheelchair for long distance travel. She has noticed significant loss of upper leg strength throughout this process. In addition to knee symptoms she has developed bilateral wrist nodules and pain she attributes to pressure having to transfer and stand using upper body due to leg pain and weakness. She has no past arthritis history before this started about 2 years ago. She has psoriasis typically  affecting her upper torso not severe and not on long term medication for this. 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 She had mitral valve replacement by thoracotomy in 2017 without infection or inflammatory pathology noted.    Synopvial fluid repeat aspirations 02/2021-10/2021 WBCs 8000-45000 Neutrophils 58-95% Crystals negative Microbiology negative   11/30/2021 MRI left femur IMPRESSION: Large heterogeneous left knee joint effusion, partially visualized, with thick synovial enhancement suggestive of synovitis, possibly hemarthrosis. Intramuscular edema within the distal vastus medialis muscle, likely reactive. If clinically indicated, a dedicated left knee MRI may be useful for further evaluation.   Review of Systems  Constitutional:  Positive for fatigue.  HENT:  Positive for mouth dryness. Negative for mouth sores and nose dryness.   Eyes:  Negative for pain, itching and dryness.  Respiratory:  Negative for shortness of breath and difficulty breathing.   Cardiovascular:  Negative for chest pain and palpitations.  Gastrointestinal:  Negative for blood in stool, constipation and diarrhea.  Endocrine: Negative for increased urination.  Genitourinary:  Negative for difficulty urinating.  Musculoskeletal:  Positive for joint pain, joint pain, joint swelling, myalgias, morning stiffness, muscle tenderness and myalgias.  Skin:  Negative for color change, rash and redness.  Allergic/Immunologic: Negative for susceptible to infections.  Neurological:  Positive for weakness. Negative for dizziness, numbness, headaches and memory loss.  Hematological:  Negative for bruising/bleeding tendency.  Psychiatric/Behavioral:  Negative for confusion.    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   Mitral insufficiency    Mitral valve prolapse 12/26/2015   Anxiety 11/02/2015    Past Medical History:  Diagnosis Date   Anemia    low iron in the past   Anxiety    pt denies   Arthritis    "back?" (11/03/2015)   Chest pain 11/02/2015   Depression    pt denies   Exertional dyspnea 01/05/2016   GERD (gastroesophageal reflux disease)    Heart murmur    History of peptic ulcer "late 1970's"   Migraine    "maybe 3-4/yr now" (11/03/2015)   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   Complex valvuloplasty including triangular resection of posterior leaflet, artificial Gore-tex neochord placement x6 and 34 mm Memo 3D ring annuloplasty via right mini thoracotomy approach with clipping of LA appendage   Thyroid nodule 01/29/2016   2.4 cm enhancing mixed cystic and solid nodule inferior left thyroid gland noted on CT angiogram    Family History  Problem Relation Age of Onset   Hypertension Mother    Cancer Mother 15       MELANOMA   Cancer Father        PROSTATE   Cancer Sister 33       BREAST   Past Surgical History:  Procedure Laterality Date   BACK SURGERY     BREAST  BIOPSY Left ~ 2005   BREAST LUMPECTOMY Left ~ 2005   CARDIAC CATHETERIZATION N/A 01/16/2016   Procedure: Right/Left Heart Cath and Coronary Angiography;  Surgeon: Sanda Klein, MD;  Location: [place] [facility];  Service: Cardiovascular;  Laterality: N/A;   CLIPPING OF ATRIAL APPENDAGE Left 03/06/2016   Procedure: CLIPPING OF LEFT ATRIAL APPENDAGE using a 44 PRO2 AtriClip;  Surgeon: Rexene Alberts, MD;  Location: [place];  Service: Open Heart Surgery;  Laterality: Left;   COLONOSCOPY     EUS N/A 02/20/2016   Procedure: ESOPHAGEAL ENDOSCOPIC ULTRASOUND (EUS) RADIAL;  Surgeon: Arta Silence, MD;  [facility]: [facility];  Service: Endoscopy;  Laterality: N/A;   EYE MUSCLE SURGERY Bilateral 1970's?   KNEE ARTHROSCOPY Right 04/04/2020   Procedure: RIGHT KNEE ARTHROSCOPY WITH DEBRIDEMENT;  Surgeon: Meredith Pel, MD;  Location: [place];  Service: Orthopedics;  Laterality: Right;   KNEE ARTHROSCOPY Right 04/10/2021   Procedure: right knee arthroscopy, debridement, placement of antibiotic beads;  Surgeon: Meredith Pel, MD;  Location: [place];  Service: Orthopedics;  Laterality: Right;   KNEE ARTHROSCOPY Left 12/10/2021   Procedure: LEFT KNEE ARTHROSCOPY, DEBRIDEMENT, PLACEMENT OF STIMULAN BEADS;  Surgeon: Meredith Pel, MD;  Location:  [place] [facility];  Service: Orthopedics;  Laterality: Left;   [place]   "trimmed bulges off both sides"   MITRAL VALVE REPAIR Right 03/06/2016   Procedure: MINIMALLY INVASIVE MITRAL VALVE REPAIR (MVR) using a 54 Sorin Memo 3D Ring;  Surgeon: Rexene Alberts, MD;  Location: [place];  Service: Open Heart Surgery;  Laterality: Right;   TEE WITHOUT CARDIOVERSION N/A 01/05/2016   Procedure: TRANSESOPHAGEAL ECHOCARDIOGRAM (TEE);  Surgeon: Sanda Klein, MD;  [facility];  Service: Cardiovascular;  Laterality: N/A;   TEE WITHOUT CARDIOVERSION N/A 03/06/2016   Procedure: TRANSESOPHAGEAL ECHOCARDIOGRAM (TEE);  Surgeon: Rexene Alberts, MD;  Location: [place];   Service: Open Heart Surgery;  Laterality: N/A;   TUBAL LIGATION     Social History   Social History Narrative   Not on file    There is no immunization history on file for this patient.   Objective: Vital Signs: BP 129/85 (BP Location: Left Arm, Patient Position: Sitting, Cuff Size: Normal)    Pulse 82    Ht 5\' 6"  (1.676 m)    Wt 146 lb (66.2 kg) Comment: per patient   BMI 23.57 kg/m    Physical Exam Constitutional:      Comments: In wheelchair  Musculoskeletal:     Right lower leg: No edema.     Left lower leg: No edema.  Skin:    General: Skin is warm and dry.  Neurological:     Mental Status: She is alert.  Psychiatric:        Mood and Affect: Mood normal.     Musculoskeletal Exam:  Elbows full ROM no tenderness or swelling Large right and smaller left wrist ganglion cysts over FCR tendons, mildly tender to pressure both sides, wrist passive ROM is intact Bilateral thumb OA changes, no finger synovitis Bilateral knee swelling, worse on left but with warmth and tenderness bilaterally Ankles full ROM no tenderness or swelling  Investigation: No additional findings.  Imaging: No results found.  Recent Labs: Lab Results  Component Value Date   WBC 7.9 11/28/2021   HGB 13.8 11/28/2021   PLT 362 11/28/2021   NA 139 12/10/2021   K 3.4 (L) 12/10/2021   CL 106 12/10/2021   CO2 24 12/10/2021   GLUCOSE 108 (H) 12/10/2021   BUN 11 12/10/2021   CREATININE 0.64 12/10/2021   BILITOT 1.1 03/04/2016   ALKPHOS 60 03/04/2016   AST 19 03/04/2016   ALT 12 (L) 03/04/2016   PROT 6.7 03/04/2016   ALBUMIN 4.3 03/04/2016   CALCIUM 8.7 (L) 12/10/2021   GFRAA >60 04/04/2020   QFTBGOLDPLUS NEGATIVE 02/08/2022    Speciality Comments: No specialty comments available.  Procedures:  No procedures performed Allergies: Vancomycin   Assessment / Plan:     Visit Diagnoses: Synovitis of left knee Synovitis of right knee  Very high CRP she has had completed antibiotic  treatments and chronicity of symptoms more suggests inflammatory process. 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 Starting sulfasalazine quickly titration to 500 mg TID.  Positive ANA (antinuclear antibody)  Low positive 1:80 titer no particular systemic features so no additional testing recommended at this time.  Bilateral wrist pain  May also be overuse related she is relying almost entirely on upper body for transferring due to knee pain. Will need to see how much does this improve with treating for inflammation.  Iron deficiency  Low iron and low TIBC no obvious losses but I suspect chronic inflammation as  a main contributor right now. Considering degree of deficiency I think she would benefit with IV replacement will refer to hematology for their assessment and management.  Orders: No orders of the defined types were placed in this encounter.  Meds ordered this encounter  Medications   sulfaSALAzine (AZULFIDINE) 500 MG tablet    Sig: Take 1 tablet (500 mg total) by mouth 4 (four) times daily.    Dispense:  120 tablet    Refill:  1     Follow-Up Instructions: Return in about 7 weeks (around 04/17/2022) for SSZ start u 6-8 wks.   Collier Salina, MD  Note - This record has been created using [employer].  Chart creation errors have been sought, but may not always  have been located. Such creation errors do not reflect on  the standard of medical care.

## 2022-03-07 ENCOUNTER — Telehealth: Payer: Self-pay

## 2022-03-07 DIAGNOSIS — E611 Iron deficiency: Secondary | ICD-10-CM

## 2022-03-07 NOTE — Telephone Encounter (Signed)
Patient called stating she had an appointment on 02/27/22 and Dr. Dimple Casey told her that her Iron was low and that he would refer her to [facility] for iron infusions.  Patient states she hasn't received a call and is checking the status of her referral.   ?

## 2022-03-08 NOTE — Telephone Encounter (Signed)
Advised patient that referral has been placed. Patient verbalized understanding.  ?

## 2022-03-08 NOTE — Telephone Encounter (Signed)
Referral placed for iron deficiency suspect she needs IV replacement

## 2022-03-11 ENCOUNTER — Telehealth: Payer: Self-pay | Admitting: Hematology

## 2022-03-11 NOTE — Telephone Encounter (Signed)
Scheduled appt per 3/10 referral. Pt is aware of appt date and time. Pt is aware to arrive 15 mins prior to appt time and to bring and updated insurance card. Pt is aware of appt location.   ?

## 2022-03-15 ENCOUNTER — Telehealth: Payer: Self-pay | Admitting: Hematology and Oncology

## 2022-03-15 NOTE — Telephone Encounter (Signed)
R/s pts new hem appt per provider request. Pt is aware of new appt date and time.  ?

## 2022-03-21 ENCOUNTER — Encounter: Payer: Medicare Other | Admitting: Hematology

## 2022-03-29 ENCOUNTER — Inpatient Hospital Stay: Payer: Medicare Other

## 2022-03-29 ENCOUNTER — Inpatient Hospital Stay: Payer: Medicare Other | Attending: Hematology | Admitting: Hematology and Oncology

## 2022-03-29 ENCOUNTER — Other Ambulatory Visit: Payer: Self-pay

## 2022-03-29 ENCOUNTER — Other Ambulatory Visit: Payer: Self-pay | Admitting: Hematology and Oncology

## 2022-03-29 DIAGNOSIS — R531 Weakness: Secondary | ICD-10-CM | POA: Diagnosis not present

## 2022-03-29 DIAGNOSIS — Z808 Family history of malignant neoplasm of other organs or systems: Secondary | ICD-10-CM | POA: Diagnosis not present

## 2022-03-29 DIAGNOSIS — Z803 Family history of malignant neoplasm of breast: Secondary | ICD-10-CM | POA: Diagnosis not present

## 2022-03-29 DIAGNOSIS — D5913 Mixed type autoimmune hemolytic anemia: Secondary | ICD-10-CM

## 2022-03-29 DIAGNOSIS — Z8042 Family history of malignant neoplasm of prostate: Secondary | ICD-10-CM

## 2022-03-29 DIAGNOSIS — D509 Iron deficiency anemia, unspecified: Secondary | ICD-10-CM | POA: Diagnosis not present

## 2022-03-29 LAB — CMP (CANCER CENTER ONLY)
ALT: <5 U/L (ref 0–44)
AST: 11 U/L — ABNORMAL LOW (ref 15–41)
Albumin: 3.8 g/dL (ref 3.5–5.0)
Alkaline Phosphatase: 100 U/L (ref 38–126)
Anion gap: 11 (ref 5–15)
BUN: 10 mg/dL (ref 8–23)
CO2: 26 mmol/L (ref 22–32)
Calcium: 9.2 mg/dL (ref 8.9–10.3)
Chloride: 106 mmol/L (ref 98–111)
Creatinine: 0.56 mg/dL (ref 0.44–1.00)
GFR, Estimated: >60 mL/min (ref >60)
Glucose, Bld: 109 mg/dL — ABNORMAL HIGH (ref 70–99)
Potassium: 3.7 mmol/L (ref 3.5–5.1)
Sodium: 143 mmol/L (ref 135–145)
Total Bilirubin: 0.3 mg/dL (ref 0.3–1.2)
Total Protein: 7.4 g/dL (ref 6.5–8.1)

## 2022-03-29 LAB — CBC WITH DIFFERENTIAL (CANCER CENTER ONLY)
Abs Immature Granulocytes: 0.02 10*3/uL (ref 0.00–0.07)
Basophils Absolute: 0.1 10*3/uL (ref 0.0–0.1)
Basophils Relative: 1 %
Eosinophils Absolute: 0.3 10*3/uL (ref 0.0–0.5)
Eosinophils Relative: 4 %
HCT: 41.2 % (ref 36.0–46.0)
Hemoglobin: 13 g/dL (ref 12.0–15.0)
Immature Granulocytes: 0 %
Lymphocytes Relative: 18 %
Lymphs Abs: 1.3 10*3/uL (ref 0.7–4.0)
MCH: 26.2 pg (ref 26.0–34.0)
MCHC: 31.6 g/dL (ref 30.0–36.0)
MCV: 82.9 fL (ref 80.0–100.0)
Monocytes Absolute: 0.6 10*3/uL (ref 0.1–1.0)
Monocytes Relative: 9 %
Neutro Abs: 4.8 10*3/uL (ref 1.7–7.7)
Neutrophils Relative %: 68 %
Platelet Count: 427 10*3/uL — ABNORMAL HIGH (ref 150–400)
RBC: 4.97 MIL/uL (ref 3.87–5.11)
RDW: 15 % (ref 11.5–15.5)
WBC Count: 7.2 10*3/uL (ref 4.0–10.5)
nRBC: 0 % (ref 0.0–0.2)

## 2022-03-29 LAB — VITAMIN B12: Vitamin B-12: 129 pg/mL — ABNORMAL LOW (ref 180–914)

## 2022-03-29 LAB — FOLATE: Folate: 6.5 ng/mL (ref >5.9)

## 2022-03-29 LAB — IRON AND IRON BINDING CAPACITY (CC-WL,HP ONLY)
Iron: 23 ug/dL — ABNORMAL LOW (ref 28–170)
Saturation Ratios: 9 % — ABNORMAL LOW (ref 10.4–31.8)
TIBC: 262 ug/dL (ref 250–450)
UIBC: 239 ug/dL (ref 148–442)

## 2022-03-29 LAB — FERRITIN: Ferritin: 187 ng/mL (ref 11–307)

## 2022-03-29 LAB — TSH: TSH: 3.03 u[IU]/mL (ref 0.308–3.960)

## 2022-03-29 LAB — LACTATE DEHYDROGENASE: LDH: 107 U/L (ref 98–192)

## 2022-03-29 NOTE — Progress Notes (Signed)
[place] ?CONSULT NOTE ? ?Patient Care Team: ?Leonard Downing, MD as PCP - General (Family Medicine) ? ?CHIEF COMPLAINTS/PURPOSE OF CONSULTATION:  ?Iron Deficiency versus other deficiency anemias ? ? ?HISTORY OF PRESENTING ILLNESS:  ?Becky Murphy 71 y.o. female is here because of recent diagnosis of Iron Deficiency. She presents to the clinic today for a consult and labs.  She was experiencing profound fatigue and generalized lower extremity weakness to the point that she is requiring a wheelchair for ambulation.  She has had multiple falls at home.  She was brought in today by her husband.  She reports that major issue is lower extremity strength.  She had extensive blood work which showed that she had extensive severe inflammation in her body.  One of the blood test showed that she had a 6% iron saturation and therefore she was referred to [place] for evaluation treatment of iron deficiency. ?She was anemic for a long long time and has received IV iron previously. ? ?I reviewed her records extensively and collaborated the history with the patient. ? ?SUMMARY OF ONCOLOGIC HISTORY: ?Oncology History  ? No history exists.  ? ? ? ?MEDICAL HISTORY:  ?Past Medical History:  ?Diagnosis Date  ? Anemia   ? low iron in the past  ? Anxiety   ? pt denies  ? Arthritis   ? "back?" (11/03/2015)  ? Chest pain 11/02/2015  ? Depression   ? pt denies  ? Exertional dyspnea 01/05/2016  ? GERD (gastroesophageal reflux disease)   ? Heart murmur   ? History of peptic ulcer "late 1970's"  ? Migraine   ? "maybe 3-4/yr now" (11/03/2015)  ? 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 Mitral valve prolapse 12/26/2015  ? symptomatic"MVP surgery postponed to get lesion of pancreas evaluated first".  ? Pancreatic mass 01/29/2016  ? 2.8 cm enhancing mass in the pancreatic neck and 3.1 cm low-attenuation cystic lesion in body of pancreas noted on CT angiogram  ? PONV (postoperative nausea and vomiting)   ? S/P  minimally invasive mitral valve repair 03/06/2016  ? Complex valvuloplasty including triangular resection of posterior leaflet, artificial Gore-tex neochord placement x6 and 34 mm Memo 3D ring annuloplasty via right mini thoracotomy approach with clipping of LA appendage  ? Thyroid nodule 01/29/2016  ? 2.4 cm enhancing mixed cystic and solid nodule inferior left thyroid gland noted on CT angiogram  ? ? ?SURGICAL HISTORY: ?Past Surgical History:  ?Procedure Laterality Date  ? BACK SURGERY    ? BREAST BIOPSY Left ~ 2005  ? BREAST LUMPECTOMY Left ~ 2005  ? CARDIAC CATHETERIZATION N/A 01/16/2016  ? Procedure: Right/Left Heart Cath and Coronary Angiography;  Surgeon: Sanda Klein, MD;  Location: [place] [facility];  Service: Cardiovascular;  Laterality: N/A;  ? CLIPPING OF ATRIAL APPENDAGE Left 03/06/2016  ? Procedure: CLIPPING OF LEFT ATRIAL APPENDAGE using a 5 PRO2 AtriClip;  Surgeon: Rexene Alberts, MD;  Location: [place];  Service: Open Heart Surgery;  Laterality: Left;  ? COLONOSCOPY    ? EUS N/A 02/20/2016  ? Procedure: ESOPHAGEAL ENDOSCOPIC ULTRASOUND (EUS) RADIAL;  Surgeon: Arta Silence, MD;  [facility];  Service: Endoscopy;  Laterality: N/A;  ? EYE MUSCLE SURGERY Bilateral 1970's?  ? KNEE ARTHROSCOPY Right 04/04/2020  ? Procedure: RIGHT KNEE ARTHROSCOPY WITH DEBRIDEMENT;  Surgeon: Meredith Pel, MD;  Location: [place];  Service: Orthopedics;  Laterality: Right;  ? KNEE ARTHROSCOPY Right 04/10/2021  ? Procedure: right knee arthroscopy, debridement,  placement of antibiotic beads;  Surgeon: Meredith Pel, MD;  Location: [place];  Service: Orthopedics;  Laterality: Right;  ? KNEE ARTHROSCOPY Left 12/10/2021  ? Procedure: LEFT KNEE ARTHROSCOPY, DEBRIDEMENT, PLACEMENT OF STIMULAN BEADS;  Surgeon: Meredith Pel, MD;  Location: [place];  Service: Orthopedics;  Laterality: Left;  ? [place]  ? "trimmed bulges off both sides"  ? MITRAL VALVE REPAIR Right 03/06/2016  ? Procedure:  MINIMALLY INVASIVE MITRAL VALVE REPAIR (MVR) using a 34 Sorin Memo 3D Ring;  Surgeon: Rexene Alberts, MD;  Location: [place];  Service: Open Heart Surgery;  Laterality: Right;  ? TEE WITHOUT CARDIOVERSION N/A 01/05/2016  ? Procedure: TRANSESOPHAGEAL ECHOCARDIOGRAM (TEE);  Surgeon: Sanda Klein, MD;  Location: [place];  Service: Cardiovascular;  Laterality: N/A;  ? TEE WITHOUT CARDIOVERSION N/A 03/06/2016  ? Procedure: TRANSESOPHAGEAL ECHOCARDIOGRAM (TEE);  Surgeon: Rexene Alberts, MD;  Location: [place];  Service: Open Heart Surgery;  Laterality: N/A;  ? TUBAL LIGATION    ? ? ?SOCIAL HISTORY: ?Social History  ? ?Socioeconomic History  ? Marital status: Married  ?  Spouse name: Not on file  ? Number of children: Not on file  ? Years of education: Not on file  ? Highest education level: Not on file  ?Occupational History  ? Not on file  ?Tobacco Use  ? Smoking status: Never  ?  Passive exposure: Current  ? Smokeless tobacco: Never  ?Vaping Use  ? Vaping Use: Never used  ?Substance and Sexual Activity  ? Alcohol use: Yes  ?  Alcohol/week: 10.0 standard drinks  ?  Types: 6 Glasses of wine, 4 Shots of liquor per week  ?  Comment: wine every other day  ? Drug use: No  ? Sexual activity: Not Currently  ?  Birth control/protection: Post-menopausal  ?Other Topics Concern  ? Not on file  ?Social History Narrative  ? Not on file  ? ?Social Determinants of Health  ? ?Financial Resource Strain: Not on file  ?Food Insecurity: Not on file  ?Transportation Needs: Not on file  ?Physical Activity: Not on file  ?Stress: Not on file  ?Social Connections: Not on file  ?Intimate Partner Violence: Not on file  ? ? ?FAMILY HISTORY: ?Family History  ?Problem Relation Age of Onset  ? Hypertension Mother   ? Cancer Mother 1  ?     MELANOMA  ? Cancer Father   ?     PROSTATE  ? Cancer Sister 22  ?     BREAST  ? ? ?ALLERGIES:  is allergic to vancomycin. ? ?MEDICATIONS:  ?Current Outpatient Medications  ?Medication Sig Dispense Refill  ?  aspirin 325 MG EC tablet Take 325 mg by mouth daily as needed for pain.    ? cholecalciferol (VITAMIN D3) 25 MCG (1000 UNIT) tablet Take 1,000 Units by mouth daily. (Patient not taking: Reported on 02/27/2022)    ? diphenhydrAMINE (BENADRYL) 25 MG tablet Take 25 mg by mouth daily as needed for allergies.    ? gabapentin (NEURONTIN) 300 MG capsule Take 900 mg by mouth at bedtime.    ? HYDROcodone-acetaminophen (NORCO/VICODIN) 5-325 MG tablet Take 1 tablet by mouth every 6 (six) hours as needed for moderate pain (pain score 4-6). 30 tablet 0  ? ibuprofen (ADVIL) 200 MG tablet Take 600 mg by mouth every 8 (eight) hours as needed for moderate pain.    ? Menthol, Topical Analgesic, ([employer] COLD & HOT MEDICATED EX) Apply 1 application topically  at bedtime as needed (pain).    ? methocarbamol (ROBAXIN) 500 MG tablet Take 1 tablet (500 mg total) by mouth every 8 (eight) hours as needed for muscle spasms. (Patient not taking: Reported on 01/16/2022) 30 tablet 0  ? Multiple Vitamins-Minerals (MULTIVITAMIN WITH MINERALS) tablet Take 1 tablet by mouth daily. (Patient not taking: Reported on 02/27/2022)    ? sulfaSALAzine (AZULFIDINE) 500 MG tablet Take 1 tablet (500 mg total) by mouth 4 (four) times daily. 120 tablet 1  ? ?No current facility-administered medications for this visit.  ? ? ?REVIEW OF SYSTEMS:   ?Constitutional: Denies fevers, chills or abnormal night sweats ?Generalized fatigue and weakness ?All other systems were reviewed with the patient and are negative. ? ?PHYSICAL EXAMINATION: ?ECOG PERFORMANCE STATUS: 2 - Symptomatic, <50% confined to bed ? ?Vitals:  ? 03/29/22 1343  ?BP: (!) 137/97  ?Pulse: 95  ?Resp: 18  ?Temp: (!) 97.5 ?F (36.4 ?C)  ?SpO2: 98%  ? ?Filed Weights  ? ?  ? ?LABORATORY DATA:  ?I have reviewed the data as listed ?Lab Results  ?Component Value Date  ? WBC 7.9 11/28/2021  ? HGB 13.8 11/28/2021  ? HCT 41.5 11/28/2021  ? MCV 87.9 11/28/2021  ? PLT 362 11/28/2021  ? ?Lab Results  ?Component Value Date  ?  NA 139 12/10/2021  ? K 3.4 (L) 12/10/2021  ? CL 106 12/10/2021  ? CO2 24 12/10/2021  ? ? ?RADIOGRAPHIC STUDIES: ?I have personally reviewed the radiological reports and agreed with the findings in the report. ? ?ASSESSMEN

## 2022-03-29 NOTE — Assessment & Plan Note (Addendum)
02/08/2022: Iron saturation 6%, TIBC 229, ferritin 122, ANA positive, 1 is 280 nuclear speckled, CRP 296.4 ?Although the iron saturation is low the TIBC and the ferritin do not indicate iron deficiency. ? ?Profound lower extremity weakness (she had bilateral knee surgeries) she thinks that she is not able to carry the weight of her legs.  I suggested that she see neurology.  She will discuss with Dr. Dimple Casey about it. ? ?In order to work-up the anemia we will perform additional blood work and I will call her next Wednesday to discuss the results and to come up with a treatment plan for her anemia. ? ? ? ? ?

## 2022-03-30 LAB — ERYTHROPOIETIN: Erythropoietin: 38.3 m[IU]/mL — ABNORMAL HIGH (ref 2.6–18.5)

## 2022-04-01 LAB — PROTEIN ELECTROPHORESIS, SERUM
A/G Ratio: 1 (ref 0.7–1.7)
Albumin ELP: 3.4 g/dL (ref 2.9–4.4)
Alpha-1-Globulin: 0.5 g/dL — ABNORMAL HIGH (ref 0.0–0.4)
Alpha-2-Globulin: 1 g/dL (ref 0.4–1.0)
Beta Globulin: 1.1 g/dL (ref 0.7–1.3)
Gamma Globulin: 0.8 g/dL (ref 0.4–1.8)
Globulin, Total: 3.4 g/dL (ref 2.2–3.9)
Total Protein ELP: 6.8 g/dL (ref 6.0–8.5)

## 2022-04-01 NOTE — Progress Notes (Signed)
HEMATOLOGY-ONCOLOGY TELEPHONE VISIT PROGRESS NOTE ? ?I connected with Becky Murphy on 04/03/22 at  7:45 AM EDT by telephone and verified that I am speaking with the correct person using two identifiers.  ?I discussed the limitations, risks, security and privacy concerns of performing an evaluation and management service by telephone and the availability of in person appointments.  ?I also discussed with the patient that there may be a patient responsible charge related to this service. The patient expressed understanding and agreed to proceed.  ? ?History of Present Illness:  ? ?REVIEW OF SYSTEMS:   ?Constitutional: Denies fevers, chills or abnormal weight loss ? ?All other systems were reviewed with the patient and are negative. ?Observations/Objective:  ? ?  ?Assessment Plan:  ?Iron deficiency anemia ?02/08/2022: Iron saturation 6%, TIBC 229, ferritin 122, ANA positive, 1 is 280 nuclear speckled, CRP 296.4 ?03/29/2022: Ferritin 187, iron saturation 9%, TSH 3, folate 6.5, SPEP: No M protein, erythropoietin: 38.3, LDH 107, creatinine 0.56, LFTs: Normal, B12: 129, hemoglobin 13, MCV 82.9, platelets 427 ? ?Profound B12 deficiency: Recommended subcutaneous B12 injections weekly x4 followed by monthly.  This is most likely the cause of her profound lower extremity weakness since she had bilateral knee surgeries (patient feels like her legs are weighing heavily) ? ?Mild iron deficiency: Iron saturation is low however the ferritin is normal.  Recommended oral iron therapy since there is no existing anemia. ? ?25-month labs and follow-up. ? ? ? ?I discussed the assessment and treatment plan with the patient. The patient was provided an opportunity to ask questions and all were answered. The patient agreed with the plan and demonstrated an understanding of the instructions. The patient was advised to call back or seek an in-person evaluation if the symptoms worsen or if the condition fails to improve as anticipated.  ? ?I provided  12 minutes of non-face-to-face time during this encounter. Harriette Ohara, MD   ?Earlie Server am scribing for Dr. Lindi Adie ? ?I have reviewed the above documentation for accuracy and completeness, and I agree with the above. ?  ?

## 2022-04-03 ENCOUNTER — Inpatient Hospital Stay: Payer: Medicare Other | Attending: Hematology | Admitting: Hematology and Oncology

## 2022-04-03 DIAGNOSIS — D509 Iron deficiency anemia, unspecified: Secondary | ICD-10-CM

## 2022-04-03 DIAGNOSIS — E538 Deficiency of other specified B group vitamins: Secondary | ICD-10-CM | POA: Insufficient documentation

## 2022-04-03 NOTE — Assessment & Plan Note (Signed)
02/08/2022: Iron saturation 6%, TIBC 229, ferritin 122, ANA positive, 1 is 280 nuclear speckled, CRP 296.4 ?03/29/2022: Ferritin 187, iron saturation 9%, TSH 3, folate 6.5, SPEP: No M protein, erythropoietin: 38.3, LDH 107, creatinine 0.56, LFTs: Normal, B12: 129, hemoglobin 13, MCV 82.9, platelets 427 ? ?Profound B12 deficiency: Recommended subcutaneous B12 injections weekly x4 followed by monthly.  This is most likely the cause of her profound lower extremity weakness since she had bilateral knee surgeries (patient feels like her legs are weighing heavily) ? ?Mild iron deficiency: Iron saturation is low however the ferritin is normal.  Recommended oral iron therapy since there is no existing anemia. ? ?16-month labs and follow-up. ?

## 2022-04-04 ENCOUNTER — Telehealth: Payer: Self-pay | Admitting: Hematology and Oncology

## 2022-04-04 NOTE — Telephone Encounter (Signed)
Scheduled appointment per 4/5 los. Patient is aware of the appointments. ?

## 2022-04-10 ENCOUNTER — Inpatient Hospital Stay: Payer: Medicare Other

## 2022-04-10 ENCOUNTER — Other Ambulatory Visit: Payer: Self-pay

## 2022-04-10 DIAGNOSIS — E538 Deficiency of other specified B group vitamins: Secondary | ICD-10-CM | POA: Diagnosis present

## 2022-04-10 MED ORDER — CYANOCOBALAMIN 1000 MCG/ML IJ SOLN
1000.0000 ug | Freq: Once | INTRAMUSCULAR | Status: AC
  Administered 2022-04-10: 1000 ug via INTRAMUSCULAR
  Filled 2022-04-10: qty 1

## 2022-04-10 NOTE — Patient Instructions (Signed)
Vitamin B12 Deficiency ?Vitamin B12 deficiency occurs when the body does not have enough vitamin B12, which is an important vitamin. The body needs this vitamin: ?To make red blood cells. ?To make DNA. This is the genetic material inside cells. ?To help the nerves work properly so they can carry messages from the brain to the body. ?Vitamin B12 deficiency can cause various health problems, such as a low red blood cell count (anemia) or nerve damage. ?What are the causes? ?This condition may be caused by: ?Not eating enough foods that contain vitamin B12. ?Not having enough stomach acid and digestive fluids to properly absorb vitamin B12 from the food that you eat. ?Certain digestive system diseases that make it hard to absorb vitamin B12. These diseases include Crohn's disease, chronic pancreatitis, and cystic fibrosis. ?A condition in which the body does not make enough of a protein (intrinsic factor), resulting in too few red blood cells (pernicious anemia). ?Having a surgery in which part of the stomach or small intestine is removed. ?Taking certain medicines that make it hard for the body to absorb vitamin B12. These medicines include: ?Heartburn medicines (antacids and proton pump inhibitors). ?Certain antibiotic medicines. ?Some medicines that are used to treat diabetes, tuberculosis, gout, or high cholesterol. ?What increases the risk? ?The following factors may make you more likely to develop a B12 deficiency: ?Being older than age 50. ?Eating a vegetarian or vegan diet, especially while you are pregnant. ?Eating a poor diet while you are pregnant. ?Taking certain medicines. ?Having alcoholism. ?What are the signs or symptoms? ?In some cases, there are no symptoms of this condition. If the condition leads to anemia or nerve damage, various symptoms can occur, such as: ?Weakness. ?Fatigue. ?Loss of appetite. ?Weight loss. ?Numbness or tingling in your hands and feet. ?Redness and burning of the  tongue. ?Confusion or memory problems. ?Depression. ?Sensory problems, such as color blindness, ringing in the ears, or loss of taste. ?Diarrhea or constipation. ?Trouble walking. ?If anemia is severe, symptoms can include: ?Shortness of breath. ?Dizziness. ?Rapid heart rate (tachycardia). ?How is this diagnosed? ?This condition may be diagnosed with a blood test to measure the level of vitamin B12 in your blood. You may also have other tests, including: ?A group of tests that measure certain characteristics of blood cells (complete blood count, CBC). ?A blood test to measure intrinsic factor. ?A procedure where a thin tube with a camera on the end is used to look into your stomach or intestines (endoscopy). ?Other tests may be needed to discover the cause of B12 deficiency. ?How is this treated? ?Treatment for this condition depends on the cause. This condition may be treated by: ?Changing your eating and drinking habits, such as: ?Eating more foods that contain vitamin B12. ?Drinking less alcohol or no alcohol. ?Getting vitamin B12 injections. ?Taking vitamin B12 supplements. Your health care provider will tell you which dosage is best for you. ?Follow these instructions at home: ?Eating and drinking ? ?Eat lots of healthy foods that contain vitamin B12, including: ?Meats and poultry. This includes beef, pork, chicken, turkey, and organ meats, such as liver. ?Seafood. This includes clams, rainbow trout, salmon, tuna, and haddock. ?Eggs. ?Cereal and dairy products that are fortified. This means that vitamin B12 has been added to the food. Check the label on the package to see if the food is fortified. ?The items listed above may not be a complete list of recommended foods and beverages. Contact a dietitian for more information. ?General instructions ?Get any   injections that are prescribed by your health care provider. ?Take supplements only as told by your health care provider. Follow the directions carefully. ?Do  not drink alcohol if your health care provider tells you not to. In some cases, you may only be asked to limit alcohol use. ?Keep all follow-up visits as told by your health care provider. This is important. ?Contact a health care provider if: ?Your symptoms come back. ?Get help right away if you: ?Develop shortness of breath. ?Have a rapid heart rate. ?Have chest pain. ?Become dizzy or lose consciousness. ?Summary ?Vitamin B12 deficiency occurs when the body does not have enough vitamin B12. ?The main causes of vitamin B12 deficiency include dietary deficiency, digestive diseases, pernicious anemia, and having a surgery in which part of the stomach or small intestine is removed. ?In some cases, there are no symptoms of this condition. If the condition leads to anemia or nerve damage, various symptoms can occur, such as weakness, shortness of breath, and numbness. ?Treatment may include getting vitamin B12 injections or taking vitamin B12 supplements. Eat lots of healthy foods that contain vitamin B12. ?This information is not intended to replace advice given to you by your health care provider. Make sure you discuss any questions you have with your health care provider. ?Document Revised: 06/13/2021 Document Reviewed: 08/25/2018 ?Elsevier Patient Education ? 2022 [employer] Inc[employer] ? ?

## 2022-04-16 NOTE — Progress Notes (Signed)
? ?Office Visit Note ? ?Patient: Becky Murphy             ?Date of Birth: 11-Apr-1951           ?MRN: [id number]             ?PCP: Kaleen Mask, MD ?Referring: Kaleen Mask, * ?Visit Date: 04/17/2022 ? ? ?Subjective:  ? ?History of Present Illness: Becky Murphy is a 71 y.o. 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 She has a significant improvement in pain and swelling in her hands, although knees remain swollen and her weakness is getting worse now completely unable to stand without assistance. She started B12 replacement shots after seeing Dr. Pamelia Hoit with deficiency. They were not concerned about iron deficiency needing replacement.  ? ?Previous HPI ?02/27/22 ?Becky Murphy is a 71 y.o. female here for follow up for bilateral recurrent knee effusions and pain. Labs at initial visit showing very high CRP 296.4 and low iron and TIBC. She continues to have significant amounts of pain and swelling limiting her mobility. ?  ?Previous HPI ?02/08/22 ?Becky Murphy is a 71 y.o. female here for evaluation of recurrent bilateral knee effusions. These started about 2 years ago affecting the right knee. She has had repeated episodes of suspected infectious knee effusions with negative cultures. Treatment has included drainage, with subsequent surgical debridement twice in the right knee which improved symptoms a lot but never entirely to normal. Medications started with initial vancomycin treatment in April 2021. She suffered a reaction with rash developing that persists on her outer legs. Repeat antibiotics afterwards and again in April 2022 after additional surgery. She developed left knee effusion initially treated with serial aspiration and then left knee arthroscopy and debridement in December. MRI imaging of the knee and thigh demonstrated significant synovitis and possible hemarthrosis. Currently her left knee is swollen and hot  with pain and decreased range of movement. She is ambulatory with a walker at home and using wheelchair for long distance travel. She has noticed significant loss of upper leg strength throughout this process. ?In addition to knee symptoms she has developed bilateral wrist nodules and pain she attributes to pressure having to transfer and stand using upper body due to leg pain and weakness. ?She has no past arthritis history before this started about 2 years ago. She has psoriasis typically affecting her upper torso not severe and not on long term medication for this. 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 She had mitral valve replacement by thoracotomy in 2017 without infection or inflammatory pathology noted. ?   ?Synopvial fluid repeat aspirations 02/2021-10/2021 ?WBCs 8000-45000 ?Neutrophils 58-95% ?Crystals negative ?Microbiology negative ?  ?11/30/2021 MRI left femur ?IMPRESSION: ?Large heterogeneous left knee joint effusion, partially visualized, with thick synovial enhancement suggestive of synovitis, possibly hemarthrosis. Intramuscular edema within the distal vastus medialis muscle, likely reactive. If clinically indicated, a dedicated left knee MRI may be useful for further evaluation. ? ? ?Review of Systems  ?Constitutional:  Positive for fatigue.  ?HENT:  Positive for mouth dryness.   ?Eyes:  Negative for dryness.  ?Respiratory:  Negative for shortness of breath.   ?Cardiovascular:  Negative for swelling in legs/feet.  ?Gastrointestinal:  Negative for constipation.  ?Endocrine: Positive for cold intolerance, excessive thirst and increased urination.  ?Genitourinary:  Negative for difficulty urinating.  ?Musculoskeletal:  Positive for joint  pain, gait problem, joint pain and muscle weakness.  ?Skin:  Negative for rash.  ?Allergic/Immunologic: Negative for susceptible to infections.  ?Neurological:  Positive for weakness.   ?Hematological:  Negative for bruising/bleeding tendency.  ?Psychiatric/Behavioral:  Negative for sleep disturbance.   ? ?PMFS History:  ?Patient Active Problem List  ? Diagnosis Date Noted  ? 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  ? High risk medication use 04/17/2022  ? Vitamin B 12 deficiency 04/03/2022  ? Iron deficiency anemia 03/29/2022  ? Bilateral wrist pain 02/27/2022  ? Hemarthrosis 02/08/2022  ? Septic arthritis of knee, left (HCC) 12/10/2021  ? Synovitis of left knee 07/19/2021  ? Vancomycin adverse reaction 04/19/2021  ? Therapeutic drug monitoring 04/19/2021  ? Synovitis of right knee 04/04/2020  ? Long term (current) use of anticoagulants [Z79.01] 03/14/2016  ? S/P minimally invasive mitral valve repair 03/06/2016  ? Pancreatic mass 01/29/2016  ? Thyroid nodule 01/29/2016  ? Exertional dyspnea 01/05/2016  ? 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 Mitral insufficiency   ? Mitral valve prolapse 12/26/2015  ? Anxiety 11/02/2015  ?  ?Past Medical History:  ?Diagnosis Date  ? Anemia   ? low iron in the past  ? Anxiety   ? pt denies  ? Arthritis   ? "back?" (11/03/2015)  ? Chest pain 11/02/2015  ? Depression   ? pt denies  ? Exertional dyspnea 01/05/2016  ? GERD (gastroesophageal reflux disease)   ? Heart murmur   ? History of peptic ulcer "late 1970's"  ? Migraine   ? "maybe 3-4/yr now" (11/03/2015)  ? 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 Mitral valve prolapse 12/26/2015  ? symptomatic"MVP surgery postponed to get lesion of pancreas evaluated first".  ? Pancreatic mass 01/29/2016  ? 2.8 cm enhancing mass in the pancreatic neck and 3.1 cm low-attenuation cystic lesion in body of pancreas noted on CT angiogram  ? PONV (postoperative nausea and vomiting)   ? S/P minimally invasive mitral valve repair 03/06/2016  ? Complex valvuloplasty including triangular resection of posterior leaflet, artificial Gore-tex neochord placement x6 and 34 mm Memo 3D ring annuloplasty  via right mini thoracotomy approach with clipping of LA appendage  ? Thyroid nodule 01/29/2016  ? 2.4 cm enhancing mixed cystic and solid nodule inferior left thyroid gland noted on CT angiogram  ?  ?Family History  ?Problem Relation Age of Onset  ? Hypertension Mother   ? Cancer Mother [id number]  ?     MELANOMA  ? Cancer Father   ?     PROSTATE  ? Cancer Sister [id number]  ?     BREAST  ? ?Past Surgical History:  ?Procedure Laterality Date  ? BACK SURGERY    ? BREAST BIOPSY Left ~ 2005  ? BREAST LUMPECTOMY Left ~ 2005  ? CARDIAC CATHETERIZATION N/A 01/16/2016  ? Procedure: Right/Left Heart Cath and Coronary Angiography;  Surgeon: Thurmon FairMihai Croitoru, MD;  Location: [facility];  Service: Cardiovascular;  Laterality: N/A;  ? CLIPPING OF ATRIAL APPENDAGE Left 03/06/2016  ? Procedure: CLIPPING OF LEFT ATRIAL APPENDAGE using a 45 PRO2 AtriClip;  Surgeon: Purcell Nailslarence H Owen, MD;  Location: [facility] OR;  Service: Open Heart Surgery;  Laterality: Left;  ? COLONOSCOPY    ? EUS N/A 02/20/2016  ? Procedure: ESOPHAGEAL ENDOSCOPIC ULTRASOUND (EUS) RADIAL;  Surgeon: Willis ModenaWilliam Outlaw, MD;  Location: [facility];  Service: Endoscopy;  Laterality: N/A;  ? EYE MUSCLE SURGERY Bilateral 1970's?  ? KNEE ARTHROSCOPY Right 04/04/2020  ? Procedure:  RIGHT KNEE ARTHROSCOPY WITH DEBRIDEMENT;  Surgeon: Cammy Copa, MD;  Location: [facility] OR;  Service: Orthopedics;  Laterality: Right;  ? KNEE ARTHROSCOPY Right 04/10/2021  ? Procedure: right knee arthroscopy, debridement, placement of antibiotic beads;  Surgeon: Cammy Copa, MD;  Location: [facility] OR;  Service: Orthopedics;  Laterality: Right;  ? KNEE ARTHROSCOPY Left 12/10/2021  ? Procedure: LEFT KNEE ARTHROSCOPY, DEBRIDEMENT, PLACEMENT OF STIMULAN BEADS;  Surgeon: Cammy Copa, MD;  Location: [facility] OR;  Service: Orthopedics;  Laterality: Left;  ? LUMBAR DISC SURGERY  1992  ? "trimmed bulges off both sides"  ? MITRAL VALVE REPAIR Right 03/06/2016  ? Procedure: MINIMALLY INVASIVE MITRAL VALVE REPAIR (MVR) using a  34 Sorin Memo 3D Ring;  Surgeon: Purcell Nails, MD;  Location: [facility] OR;  Service: Open Heart Surgery;  Laterality: Right;  ? TEE WITHOUT CARDIOVERSION N/A 01/05/2016  ? Procedure: TRANSESOPHAGEAL ECHOCARDIOGRAM (T

## 2022-04-17 ENCOUNTER — Ambulatory Visit (INDEPENDENT_AMBULATORY_CARE_PROVIDER_SITE_OTHER): Payer: Medicare Other | Admitting: Internal Medicine

## 2022-04-17 ENCOUNTER — Other Ambulatory Visit: Payer: Self-pay

## 2022-04-17 ENCOUNTER — Encounter: Payer: Self-pay | Admitting: Internal Medicine

## 2022-04-17 ENCOUNTER — Inpatient Hospital Stay: Payer: Medicare Other

## 2022-04-17 VITALS — BP 146/107 | HR 85 | Resp 12 | Ht 66.0 in | Wt 143.0 lb

## 2022-04-17 DIAGNOSIS — E538 Deficiency of other specified B group vitamins: Secondary | ICD-10-CM

## 2022-04-17 DIAGNOSIS — M659 Synovitis and tenosynovitis, unspecified: Secondary | ICD-10-CM

## 2022-04-17 DIAGNOSIS — M2500 Hemarthrosis, unspecified joint: Secondary | ICD-10-CM

## 2022-04-17 DIAGNOSIS — M1380 Other specified arthritis, unspecified site: Secondary | ICD-10-CM | POA: Insufficient documentation

## 2022-04-17 DIAGNOSIS — Z79899 Other long term (current) drug therapy: Secondary | ICD-10-CM | POA: Insufficient documentation

## 2022-04-17 MED ORDER — CYANOCOBALAMIN 1000 MCG/ML IJ SOLN
1000.0000 ug | Freq: Once | INTRAMUSCULAR | Status: AC
  Administered 2022-04-17: 1000 ug via INTRAMUSCULAR
  Filled 2022-04-17: qty 1

## 2022-04-18 LAB — CBC WITH DIFFERENTIAL/PLATELET
Absolute Monocytes: 519 cells/uL (ref 200–950)
Basophils Absolute: 59 cells/uL (ref 0–200)
Basophils Relative: 1.2 %
Eosinophils Absolute: 59 cells/uL (ref 15–500)
Eosinophils Relative: 1.2 %
HCT: 42.3 % (ref 35.0–45.0)
Hemoglobin: 13.4 g/dL (ref 11.7–15.5)
Lymphs Abs: 1240 cells/uL (ref 850–3900)
MCH: 26.4 pg — ABNORMAL LOW (ref 27.0–33.0)
MCHC: 31.7 g/dL — ABNORMAL LOW (ref 32.0–36.0)
MCV: 83.4 fL (ref 80.0–100.0)
MPV: 10.2 fL (ref 7.5–12.5)
Monocytes Relative: 10.6 %
Neutro Abs: 3023 cells/uL (ref 1500–7800)
Neutrophils Relative %: 61.7 %
Platelets: 277 10*3/uL (ref 140–400)
RBC: 5.07 10*6/uL (ref 3.80–5.10)
RDW: 17.3 % — ABNORMAL HIGH (ref 11.0–15.0)
Total Lymphocyte: 25.3 %
WBC: 4.9 10*3/uL (ref 3.8–10.8)

## 2022-04-18 LAB — COMPLETE METABOLIC PANEL WITH GFR
AG Ratio: 1.6 (calc) (ref 1.0–2.5)
ALT: 6 U/L (ref 6–29)
AST: 13 U/L (ref 10–35)
Albumin: 3.9 g/dL (ref 3.6–5.1)
Alkaline phosphatase (APISO): 98 U/L (ref 37–153)
BUN: 8 mg/dL (ref 7–25)
CO2: 25 mmol/L (ref 20–32)
Calcium: 9 mg/dL (ref 8.6–10.4)
Chloride: 105 mmol/L (ref 98–110)
Creat: 0.56 mg/dL (ref 0.50–1.05)
Globulin: 2.5 g/dL (calc) (ref 1.9–3.7)
Glucose, Bld: 97 mg/dL (ref 65–99)
Potassium: 3.9 mmol/L (ref 3.5–5.3)
Sodium: 143 mmol/L (ref 135–146)
Total Bilirubin: 0.5 mg/dL (ref 0.2–1.2)
Total Protein: 6.4 g/dL (ref 6.1–8.1)
eGFR: 99 mL/min/{1.73_m2} (ref >=60)

## 2022-04-18 LAB — SEDIMENTATION RATE: Sed Rate: 25 mm/h (ref 0–30)

## 2022-04-18 LAB — C-REACTIVE PROTEIN: CRP: 52.1 mg/L — ABNORMAL HIGH (ref <8.0)

## 2022-04-19 MED ORDER — SULFASALAZINE 500 MG PO TABS: 1000.0000 mg | ORAL_TABLET | Freq: Three times a day (TID) | ORAL | 2 refills | Status: DC

## 2022-04-19 NOTE — Progress Notes (Signed)
Inflammatory markers are greatly improved although still elevated. No problems with the medication. I recommend we increase to 1000 mg 3 times daily. I am sending a new prescription for the 1000 mg tablets, she can take 2 of the old tablets at a time is also okay. ?She should schedule follow up again in 2-3 months since we are increasing the medication dose significantly.

## 2022-04-24 ENCOUNTER — Inpatient Hospital Stay: Payer: Medicare Other

## 2022-04-24 ENCOUNTER — Other Ambulatory Visit: Payer: Self-pay

## 2022-04-24 DIAGNOSIS — E538 Deficiency of other specified B group vitamins: Secondary | ICD-10-CM | POA: Diagnosis not present

## 2022-04-24 MED ORDER — CYANOCOBALAMIN 1000 MCG/ML IJ SOLN
1000.0000 ug | Freq: Once | INTRAMUSCULAR | Status: AC
  Administered 2022-04-24: 1000 ug via INTRAMUSCULAR
  Filled 2022-04-24: qty 1

## 2022-04-24 NOTE — Patient Instructions (Signed)
Vitamin B12 Deficiency Vitamin B12 deficiency occurs when the body does not have enough of this important vitamin. The body needs this vitamin: To make red blood cells. To make DNA. This is the genetic material inside cells. To help the nerves work properly so they can carry messages from the brain to the body. Vitamin B12 deficiency can cause health problems, such as not having enough red blood cells in the blood (anemia). This can lead to nerve damage if untreated. What are the causes? This condition may be caused by: Not eating enough foods that contain vitamin B12. Not having enough stomach acid and digestive fluids to properly absorb vitamin B12 from the food that you eat. Having certain diseases that make it hard to absorb vitamin B12. These diseases include Crohn's disease, chronic pancreatitis, and cystic fibrosis. An autoimmune disorder in which the body does not make enough of a protein (intrinsic factor) within the stomach, resulting in not enough absorption of vitamin B12. Having a surgery in which part of the stomach or small intestine is removed. Taking certain medicines that make it hard for the body to absorb vitamin B12. These include: Heartburn medicines, such as antacids and proton pump inhibitors. Some medicines that are used to treat diabetes. What increases the risk? The following factors may make you more likely to develop a vitamin B12 deficiency: Being an older adult. Eating a vegetarian or vegan diet that does not include any foods that come from animals. Eating a poor diet while you are pregnant. Taking certain medicines. Having alcoholism. What are the signs or symptoms? In some cases, there are no symptoms of this condition. If the condition leads to anemia or nerve damage, various symptoms may occur, such as: Weakness. Tiredness (fatigue). Loss of appetite. Numbness or tingling in your hands and feet. Redness and burning of the tongue. Depression,  confusion, or memory problems. Trouble walking. If anemia is severe, symptoms can include: Shortness of breath. Dizziness. Rapid heart rate. How is this diagnosed? This condition may be diagnosed with a blood test to measure the level of vitamin B12 in your blood. You may also have other tests, including: A group of tests that measure certain characteristics of blood cells (complete blood count, CBC). A blood test to measure intrinsic factor. A procedure where a thin tube with a camera on the end is used to look into your stomach or intestines (endoscopy). Other tests may be needed to discover the cause of the deficiency. How is this treated? Treatment for this condition depends on the cause. This condition may be treated by: Changing your eating and drinking habits, such as: Eating more foods that contain vitamin B12. Drinking less alcohol or no alcohol. Getting vitamin B12 injections. Taking vitamin B12 supplements by mouth (orally). Your health care provider will tell you which dose is best for you. Follow these instructions at home: Eating and drinking  Include foods in your diet that come from animals and contain a lot of vitamin B12. These include: Meats and poultry. This includes beef, pork, chicken, turkey, and organ meats, such as liver. Seafood. This includes clams, rainbow trout, salmon, tuna, and haddock. Eggs. Dairy foods such as milk, yogurt, and cheese. Eat foods that have vitamin B12 added to them (are fortified), such as ready-to-eat breakfast cereals. Check the label on the package to see if a food is fortified. The items listed above may not be a complete list of foods and beverages you can eat and drink. Contact a dietitian for   more information. Alcohol use Do not drink alcohol if: Your health care provider tells you not to drink. You are pregnant, may be pregnant, or are planning to become pregnant. If you drink alcohol: Limit how much you have to: 0-1 drink a  day for women. 0-2 drinks a day for men. Know how much alcohol is in your drink. In the [place] one drink equals one 12 oz bottle of beer (355 mL), one 5 oz glass of wine (148 mL), or one 1 oz glass of hard liquor (44 mL). General instructions Get vitamin B12 injections if told to by your health care provider. Take supplements only as told by your health care provider. Follow the directions carefully. Keep all follow-up visits. This is important. Contact a health care provider if: Your symptoms come back. Your symptoms get worse or do not improve with treatment. Get help right away: You develop shortness of breath. You have a rapid heart rate. You have chest pain. You become dizzy or you faint. These symptoms may be an emergency. Get help right away. Call 911. Do not wait to see if the symptoms will go away. Do not drive yourself to the hospital. Summary Vitamin B12 deficiency occurs when the body does not have enough of this important vitamin. Common causes include not eating enough foods that contain vitamin B12, not being able to absorb vitamin B12 from the food that you eat, having a surgery in which part of the stomach or small intestine is removed, or taking certain medicines. Eat foods that have vitamin B12 in them. Treatment may include making a change in the way you eat and drink, getting vitamin B12 injections, or taking vitamin B12 supplements. This information is not intended to replace advice given to you by your health care provider. Make sure you discuss any questions you have with your health care provider. Document Revised: 08/10/2021 Document Reviewed: 08/10/2021 Elsevier Patient Education  2023 Elsevier Inc.  

## 2022-05-01 ENCOUNTER — Inpatient Hospital Stay: Payer: Medicare Other | Attending: Hematology

## 2022-05-01 ENCOUNTER — Other Ambulatory Visit: Payer: Self-pay

## 2022-05-01 DIAGNOSIS — E538 Deficiency of other specified B group vitamins: Secondary | ICD-10-CM | POA: Insufficient documentation

## 2022-05-01 MED ORDER — CYANOCOBALAMIN 1000 MCG/ML IJ SOLN: 1000.0000 ug | Freq: Once | INTRAMUSCULAR | Status: DC

## 2022-05-01 MED ORDER — CYANOCOBALAMIN 1000 MCG/ML IJ SOLN
1000.0000 ug | Freq: Once | INTRAMUSCULAR | Status: AC
  Administered 2022-05-01: 1000 ug via INTRAMUSCULAR
  Filled 2022-05-01: qty 1

## 2022-05-29 ENCOUNTER — Other Ambulatory Visit: Payer: Self-pay

## 2022-05-29 ENCOUNTER — Inpatient Hospital Stay: Payer: Medicare Other

## 2022-05-29 DIAGNOSIS — E538 Deficiency of other specified B group vitamins: Secondary | ICD-10-CM

## 2022-05-29 MED ORDER — CYANOCOBALAMIN 1000 MCG/ML IJ SOLN
1000.0000 ug | Freq: Once | INTRAMUSCULAR | Status: AC
  Administered 2022-05-29: 1000 ug via INTRAMUSCULAR
  Filled 2022-05-29: qty 1

## 2022-06-01 ENCOUNTER — Other Ambulatory Visit: Payer: Self-pay | Admitting: Internal Medicine

## 2022-06-01 DIAGNOSIS — M1380 Other specified arthritis, unspecified site: Secondary | ICD-10-CM

## 2022-06-02 NOTE — Telephone Encounter (Signed)
Next Visit: 06/18/2022  Last Visit: 04/17/2022  Last Fill: 04/19/2022  DX: Seronegative inflammatory arthritis   Current Dose per office note 04/19/2022 lab note increase to 1000 mg 3 times daily   Labs: 04/17/2022, Inflammatory markers are greatly improved although still elevated. No problems with the medication. I recommend we increase to 1000 mg 3 times daily. I am sending a new prescription for the 1000 mg tablets, she can take 2 of the old tablets at a time is also okay. She should schedule follow up again in 2-3 months since we are increasing the medication dose significantly.  Okay to refill SSZ?

## 2022-06-18 ENCOUNTER — Ambulatory Visit: Payer: Medicare Other | Admitting: Internal Medicine

## 2022-06-26 ENCOUNTER — Inpatient Hospital Stay: Payer: Medicare Other | Attending: Hematology

## 2022-06-26 ENCOUNTER — Other Ambulatory Visit: Payer: Self-pay

## 2022-06-26 DIAGNOSIS — E538 Deficiency of other specified B group vitamins: Secondary | ICD-10-CM | POA: Diagnosis not present

## 2022-06-26 MED ORDER — CYANOCOBALAMIN 1000 MCG/ML IJ SOLN
1000.0000 ug | Freq: Once | INTRAMUSCULAR | Status: AC
  Administered 2022-06-26: 1000 ug via INTRAMUSCULAR
  Filled 2022-06-26: qty 1

## 2022-07-05 ENCOUNTER — Ambulatory Visit (INDEPENDENT_AMBULATORY_CARE_PROVIDER_SITE_OTHER): Payer: Medicare Other | Admitting: Surgical

## 2022-07-05 ENCOUNTER — Ambulatory Visit: Payer: Self-pay

## 2022-07-05 DIAGNOSIS — M25561 Pain in right knee: Secondary | ICD-10-CM | POA: Diagnosis not present

## 2022-07-05 DIAGNOSIS — M25562 Pain in left knee: Secondary | ICD-10-CM

## 2022-07-05 DIAGNOSIS — G8929 Other chronic pain: Secondary | ICD-10-CM

## 2022-07-05 DIAGNOSIS — M25461 Effusion, right knee: Secondary | ICD-10-CM | POA: Diagnosis not present

## 2022-07-11 ENCOUNTER — Telehealth: Payer: Self-pay | Admitting: Orthopedic Surgery

## 2022-07-11 LAB — CBC WITH DIFFERENTIAL/PLATELET
Absolute Monocytes: 637 cells/uL (ref 200–950)
Basophils Absolute: 71 cells/uL (ref 0–200)
Basophils Relative: 1.2 %
Eosinophils Absolute: 118 cells/uL (ref 15–500)
Eosinophils Relative: 2 %
HCT: 50.6 % — ABNORMAL HIGH (ref 35.0–45.0)
Hemoglobin: 16.4 g/dL — ABNORMAL HIGH (ref 11.7–15.5)
Lymphs Abs: 1103 cells/uL (ref 850–3900)
MCH: 28.6 pg (ref 27.0–33.0)
MCHC: 32.4 g/dL (ref 32.0–36.0)
MCV: 88.2 fL (ref 80.0–100.0)
MPV: 11.3 fL (ref 7.5–12.5)
Monocytes Relative: 10.8 %
Neutro Abs: 3971 cells/uL (ref 1500–7800)
Neutrophils Relative %: 67.3 %
Platelets: 221 10*3/uL (ref 140–400)
RBC: 5.74 10*6/uL — ABNORMAL HIGH (ref 3.80–5.10)
RDW: 15.7 % — ABNORMAL HIGH (ref 11.0–15.0)
Total Lymphocyte: 18.7 %
WBC: 5.9 10*3/uL (ref 3.8–10.8)

## 2022-07-11 LAB — ANAEROBIC AND AEROBIC CULTURE
AER RESULT:: NO GROWTH
MICRO NUMBER:: 13617894
MICRO NUMBER:: 13617895
SPECIMEN QUALITY:: ADEQUATE
SPECIMEN QUALITY:: ADEQUATE

## 2022-07-11 LAB — SYNOVIAL FLUID ANALYSIS, COMPLETE
Basophils, %: 0 %
Eosinophils-Synovial: 0 % (ref 0–2)
Lymphocytes-Synovial Fld: 2 % (ref 0–74)
Monocyte/Macrophage: 22 % (ref 0–69)
Neutrophil, Synovial: 76 % — ABNORMAL HIGH (ref 0–24)
Synoviocytes, %: 0 % (ref 0–15)
WBC, Synovial: 920 cells/uL — ABNORMAL HIGH (ref <150)

## 2022-07-11 LAB — SEDIMENTATION RATE: Sed Rate: 2 mm/h (ref 0–30)

## 2022-07-11 LAB — C-REACTIVE PROTEIN: CRP: 5.7 mg/L (ref <8.0)

## 2022-07-11 NOTE — Telephone Encounter (Signed)
Awaiting results on epic, still not back yet by the looks of it

## 2022-07-11 NOTE — Telephone Encounter (Signed)
T called requesting a call back with her blood work results. Pt states she was told to call in if she did not receive a call by the end of the week. Pt phone number is [phone].

## 2022-07-14 ENCOUNTER — Encounter: Payer: Self-pay | Admitting: Surgical

## 2022-07-14 MED ORDER — LIDOCAINE HCL 1 % IJ SOLN
5.0000 mL | INTRAMUSCULAR | Status: AC | PRN
  Administered 2022-07-05: 5 mL

## 2022-07-14 NOTE — Progress Notes (Signed)
Office Visit Note   Patient: Becky Murphy           Date of Birth: 1951/08/27           MRN: [id number] Visit Date: 07/05/2022 Requested by: Leonard Downing, MD [address] [place],  [place] [address] PCP: Leonard Downing, MD  Subjective: Chief Complaint  Patient presents with   Right Knee - Pain   Left Knee - Pain    HPI: Becky Murphy is a 71 y.o. female who presents to the office complaining of bilateral knee pain.  Patient has a history of bilateral knee osteoarthritis.  She has had history of suspected culture-negative bacterial infections of both the left and right knee joints with subsequent treatment with arthroscopic irrigation and debridement with placement of stimulant beads and 6 weeks of IV antibiotics.  She had significant improvement following these procedures but with the most recent procedure, she had subsequent rheumatologic evaluation and was started on sulfasalazine with significant improvement in a lot of her pain at rest as well as significant improvement in her CRP measurement over the course of her treatment.  She still is not able to walk due to the severe knee pain that she has.  She can stand for about 1 minute before she has to sit back down.  She has pain that travels down both shins.  Right knee bothers her more than the left knee..                ROS: All systems reviewed are negative as they relate to the chief complaint within the history of present illness.  Patient denies fevers or chills.  Assessment & Plan: Visit Diagnoses:  1. Chronic pain of both knees   2. Effusion of right knee     Plan: Patient is a 71 year old female who presents for evaluation of bilateral knee arthritis.  She has severe arthritis that has been demonstrated on arthroscopy pictures from both knees as well as radiographs.  She has had suspected culture-negative infections of both knees with most recent episode in the left knee that she had arthroscopy with  debridement and placement of stimulant beads on 12/10/2021.  She had IV antibiotics following this procedure with good relief of her symptoms and improvement.  However, she was seen by Dr. 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  She was started on sulfasalazine with CRP trending better with each visit with Dr. Benjamine Mola; trended down from 296.4-52.1 from February to April.  With her severe pain that is altering her ability to function and get around, she would like to proceed with knee replacement surgery starting with the right knee.  Had a long discussion with [name] and her husband [name] about the elevated risks of knee replacement surgery in her case.  Discussed the risks and benefits of the procedure including but not limited to the risk of nerve/vessel damage, knee stiffness, knee instability, prosthetic joint infection, need for revision surgery, medical complication from surgery such as DVT/PE/cardiac complication/stroke/death, amputation of the surgical limb.  After the discussion of risks, patient would still like to proceed with surgery due to the severity of her symptoms.  Plan to check ESR, CRP, CBC with differential today.  All of these lab values were found to be in normal range with CRP down to 5.7 from 52.1 in April.  The right knee joint was also aspirated with synovial fluid analysis demonstrating cell count of 920 with no organisms seen  on Gram stain and no organisms identified on anaerobic/aerobic culture.  Called [name] to inform her of results.  After discussion, plan to discuss with Dr. Candiss Norse on her opinion of whether this was truly culture-negative bacterial infection or if it was just presentation of seronegative inflammatory arthritis.  Plan for further surgical discussion with [name] based on discussion with Dr. Candiss Norse.  Follow-Up Instructions: No follow-ups on file.   Orders:  Orders Placed This Encounter  Procedures   Anaerobic and  Aerobic Culture   Sed Rate (ESR)   C-reactive protein   CBC with Differential   Synovial Fluid Analysis, Complete   No orders of the defined types were placed in this encounter.     Procedures: Large Joint Inj: R knee on 07/05/2022 12:01 PM Indications: diagnostic evaluation, joint swelling and pain Details: 18 G 1.5 in needle, superolateral approach  Arthrogram: No  Medications: 5 mL lidocaine 1 % Aspirate: 5 mL clear and yellow Outcome: tolerated well, no immediate complications  With no effusion noted on exam, patient was prepped out in the lateral aspect of the right knee.  Sterile saline was injected into the right knee joint and then aspirated after about 10 seconds.  This fluid will be sent off for Gram stain, synovial fluid analysis, anaerobic/aerobic culture. Procedure, treatment alternatives, risks and benefits explained, specific risks discussed. Consent was given by the patient. Immediately prior to procedure a time out was called to verify the correct patient, procedure, equipment, support staff and site/side marked as required. Patient was prepped and draped in the usual sterile fashion.       Clinical Data: No additional findings.  Objective: Vital Signs: There were no vitals taken for this visit.  Physical Exam:  Constitutional: Patient appears well-developed HEENT:  Head: Normocephalic Eyes:EOM are normal Neck: Normal range of motion Cardiovascular: Normal rate Pulmonary/chest: Effort normal Neurologic: Patient is alert Skin: Skin is warm Psychiatric: Patient has normal mood and affect  Ortho Exam: Ortho exam demonstrates left knee with 20 degree flexion contracture and 120 degrees of knee flexion.  Right knee with 30 degree flexion contracture with 110 degrees of knee flexion.  There is no effusion noted in either knee.  No warmth of either knee asymmetric compared with the rest of her skin.  No cellulitis or skin changes noted in either leg.  She is able  to perform straight leg raise with both legs.  No calf tenderness.  Negative Homans' sign.  No pain with hip range of motion bilaterally.  She has moderate to severe tenderness over the medial and lateral joint lines of both knees.  Specialty Comments:  No specialty comments available.  Imaging: No results found.   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   High risk medication use 04/17/2022   Vitamin B 12 deficiency 04/03/2022   Iron deficiency anemia 03/29/2022   Bilateral wrist pain 02/27/2022   Hemarthrosis 02/08/2022   Septic arthritis of knee, left ([place]) 12/10/2021   Synovitis of left knee 07/19/2021   Vancomycin adverse reaction 04/19/2021   Therapeutic drug monitoring 04/19/2021   Synovitis of right knee 04/04/2020   Long term (current) use of anticoagulants [Z79.01] 03/14/2016   S/P minimally invasive mitral valve repair 03/06/2016   Pancreatic mass 01/29/2016   Thyroid nodule 01/29/2016   Exertional dyspnea 01/05/2016   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   Mitral insufficiency    Mitral valve prolapse 12/26/2015  Anxiety 11/02/2015   Past Medical History:  Diagnosis Date   Anemia    low iron in the past   Anxiety    pt denies   Arthritis    "back?" (11/03/2015)   Chest pain 11/02/2015   Depression    pt denies   Exertional dyspnea 01/05/2016   GERD (gastroesophageal reflux disease)    Heart murmur    History of peptic ulcer "late 1970's"   Migraine    "maybe 3-4/yr now" (11/03/2015)   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   Pancreatic mass 01/29/2016   2.8 cm enhancing mass in the pancreatic neck and 3.1 cm low-attenuation cystic lesion in body of pancreas noted on CT angiogram   PONV (postoperative nausea and vomiting)    S/P  minimally invasive mitral valve repair 03/06/2016   Complex valvuloplasty including triangular resection of posterior leaflet, artificial Gore-tex neochord placement x6 and 34 mm Memo 3D ring annuloplasty via right mini thoracotomy approach with clipping of LA appendage   Thyroid nodule 01/29/2016   2.4 cm enhancing mixed cystic and solid nodule inferior left thyroid gland noted on CT angiogram    Family History  Problem Relation Age of Onset   Hypertension Mother    Cancer Mother 87       MELANOMA   Cancer Father        PROSTATE   Cancer Sister 13       BREAST    Past Surgical History:  Procedure Laterality Date   BACK SURGERY     BREAST BIOPSY Left ~ 2005   BREAST LUMPECTOMY Left ~ 2005   CARDIAC CATHETERIZATION N/A 01/16/2016   Procedure: Right/Left Heart Cath and Coronary Angiography;  Surgeon: Sanda Klein, MD;  Location: [facility];  Service: Cardiovascular;  Laterality: N/A;   CLIPPING OF ATRIAL APPENDAGE Left 03/06/2016   Procedure: CLIPPING OF LEFT ATRIAL APPENDAGE using a 35 PRO2 AtriClip;  Surgeon: Rexene Alberts, MD;  Location: [facility];  Service: Open Heart Surgery;  Laterality: Left;   COLONOSCOPY     EUS N/A 02/20/2016   Procedure: ESOPHAGEAL ENDOSCOPIC ULTRASOUND (EUS) RADIAL;  Surgeon: Arta Silence, MD;  Location: [facility];  Service: Endoscopy;  Laterality: N/A;   EYE MUSCLE SURGERY Bilateral 1970's?   KNEE ARTHROSCOPY Right 04/04/2020   Procedure: RIGHT KNEE ARTHROSCOPY WITH DEBRIDEMENT;  Surgeon: Meredith Pel, MD;  Location: [facility];  Service: Orthopedics;  Laterality: Right;   KNEE ARTHROSCOPY Right 04/10/2021   Procedure: right knee arthroscopy, debridement, placement of antibiotic beads;  Surgeon: Meredith Pel, MD;  Location: [facility];  Service: Orthopedics;  Laterality: Right;   KNEE ARTHROSCOPY Left 12/10/2021   Procedure: LEFT KNEE ARTHROSCOPY, DEBRIDEMENT, PLACEMENT OF STIMULAN BEADS;  Surgeon: Meredith Pel, MD;  Location: [facility];   Service: Orthopedics;  Laterality: Left;   [place]   "trimmed bulges off both sides"   MITRAL VALVE REPAIR Right 03/06/2016   Procedure: MINIMALLY INVASIVE MITRAL VALVE REPAIR (MVR) using a 71 Sorin Memo 3D Ring;  Surgeon: Rexene Alberts, MD;  Location: [facility];  Service: Open Heart Surgery;  Laterality: Right;   TEE WITHOUT CARDIOVERSION N/A 01/05/2016   Procedure: TRANSESOPHAGEAL ECHOCARDIOGRAM (TEE);  Surgeon: Sanda Klein, MD;  Location: [facility];  Service: Cardiovascular;  Laterality: N/A;   TEE WITHOUT CARDIOVERSION N/A 03/06/2016   Procedure: TRANSESOPHAGEAL ECHOCARDIOGRAM (TEE);  Surgeon: Rexene Alberts, MD;  Location: [facility];  Service: Open Heart Surgery;  Laterality: N/A;   TUBAL LIGATION     Social History   Occupational History   Not on file  Tobacco Use   Smoking status: Never    Passive exposure: Current   Smokeless tobacco: Never  Vaping Use   Vaping Use: Never used  Substance and Sexual Activity   Alcohol use: Yes    Alcohol/week: 10.0 standard drinks of alcohol    Types: 6 Glasses of wine, 4 Shots of liquor per week    Comment: wine every other day   Drug use: No   Sexual activity: Not Currently    Birth control/protection: Post-menopausal

## 2022-07-15 NOTE — Telephone Encounter (Signed)
I called her to discuss on Friday

## 2022-07-16 ENCOUNTER — Ambulatory Visit: Payer: Medicare Other | Admitting: Internal Medicine

## 2022-07-17 ENCOUNTER — Telehealth: Payer: Self-pay | Admitting: Orthopedic Surgery

## 2022-07-17 NOTE — Telephone Encounter (Signed)
Patient called wanting to speak with a nurse about a referral to an infectious disease clinic, she thinks the referral may not be needed anymore because her blood work came back fine. Also wanted to know if she was clear to schedule her knee surgery. CB # [phone]

## 2022-07-24 ENCOUNTER — Inpatient Hospital Stay: Payer: Medicare Other | Attending: Hematology

## 2022-07-24 ENCOUNTER — Other Ambulatory Visit: Payer: Self-pay

## 2022-07-24 VITALS — BP 120/92 | HR 81 | Resp 18

## 2022-07-24 DIAGNOSIS — E538 Deficiency of other specified B group vitamins: Secondary | ICD-10-CM | POA: Insufficient documentation

## 2022-07-24 MED ORDER — CYANOCOBALAMIN 1000 MCG/ML IJ SOLN
1000.0000 ug | Freq: Once | INTRAMUSCULAR | Status: AC
  Administered 2022-07-24: 1000 ug via INTRAMUSCULAR
  Filled 2022-07-24: qty 1

## 2022-08-01 ENCOUNTER — Telehealth: Payer: Self-pay | Admitting: Orthopedic Surgery

## 2022-08-01 ENCOUNTER — Encounter: Payer: Self-pay | Admitting: Orthopedic Surgery

## 2022-08-01 ENCOUNTER — Ambulatory Visit (INDEPENDENT_AMBULATORY_CARE_PROVIDER_SITE_OTHER): Payer: Medicare Other | Admitting: Orthopedic Surgery

## 2022-08-01 ENCOUNTER — Other Ambulatory Visit: Payer: Self-pay

## 2022-08-01 VITALS — Ht 66.0 in | Wt 143.4 lb

## 2022-08-01 DIAGNOSIS — M1711 Unilateral primary osteoarthritis, right knee: Secondary | ICD-10-CM

## 2022-08-01 NOTE — Telephone Encounter (Signed)
Patient is scheduled for right total knee on 09-03-22.  Can we get a referral for this patient to see Dr. Royann Shivers?  She was last seen in 2017 and needs to reestablish with Cardiology for surgical clearance.

## 2022-08-01 NOTE — Progress Notes (Signed)
Office Visit Note  Patient: Becky Murphy             Date of Birth: 1951-08-05           MRN: [id number]             PCP: Leonard Downing, MD Referring: Leonard Downing, * Visit Date: 08/14/2022   Subjective:  Follow-up (Feeling better since last visit. )   History of Present Illness: Becky Murphy is a 71 y.o. female here for follow up or follow up for seronegative inflammatory arthritis. She is scheduled for right knee arthroplasty 09/03/22 with Dr. Marlou Sa. She has continued feeling a good improvement in joint pains on continued SSZ 1000 mg TID. She has not had any major intolerance or infections or drug reactions. Does continue to have knee swelling and limited mobility.  Previous HPI 04/17/2022 Becky Murphy is a 71 y.o. 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 She has a significant improvement in pain and swelling in her hands, although knees remain swollen and her weakness is getting worse now completely unable to stand without assistance. She started B12 replacement shots after seeing Dr. Lindi Adie with deficiency. They were not concerned about iron deficiency needing replacement.    Previous HPI 02/27/22 Becky Murphy is a 71 y.o. female here for follow up for bilateral recurrent knee effusions and pain. Labs at initial visit showing very high CRP 296.4 and low iron and TIBC. She continues to have significant amounts of pain and swelling limiting her mobility.   Previous HPI 02/08/22 Becky Murphy is a 71 y.o. female here for evaluation of recurrent bilateral knee effusions. These started about 2 years ago affecting the right knee. She has had repeated episodes of suspected infectious knee effusions with negative cultures. Treatment has included drainage, with subsequent surgical debridement twice in the right knee which improved symptoms a lot but never entirely to normal. Medications started with  initial vancomycin treatment in April 2021. She suffered a reaction with rash developing that persists on her outer legs. Repeat antibiotics afterwards and again in April 2022 after additional surgery. She developed left knee effusion initially treated with serial aspiration and then left knee arthroscopy and debridement in December. MRI imaging of the knee and thigh demonstrated significant synovitis and possible hemarthrosis. Currently her left knee is swollen and hot with pain and decreased range of movement. She is ambulatory with a walker at home and using wheelchair for long distance travel. She has noticed significant loss of upper leg strength throughout this process. In addition to knee symptoms she has developed bilateral wrist nodules and pain she attributes to pressure having to transfer and stand using upper body due to leg pain and weakness. She has no past arthritis history before this started about 2 years ago. She has psoriasis typically affecting her upper torso not severe and not on long term medication for this. 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 She had mitral valve replacement by thoracotomy in 2017 without infection or inflammatory pathology noted.    Synopvial fluid repeat aspirations 02/2021-10/2021 WBCs 8000-45000 Neutrophils 58-95% Crystals negative Microbiology negative   11/30/2021 MRI left femur IMPRESSION: Large heterogeneous left knee joint effusion, partially visualized, with thick synovial enhancement suggestive of synovitis, possibly hemarthrosis. Intramuscular edema within the distal vastus medialis muscle, likely reactive. If clinically indicated, a dedicated left  knee MRI may be useful for further evaluation.   Review of Systems  Constitutional:  Negative for fatigue.  HENT:  Negative for mouth sores and mouth dryness.   Eyes:  Negative for dryness.  Respiratory:  Negative for  shortness of breath.   Cardiovascular:  Negative for chest pain and palpitations.  Gastrointestinal:  Negative for blood in stool, constipation and diarrhea.  Endocrine: Negative for increased urination.  Genitourinary:  Negative for involuntary urination.  Musculoskeletal:  Positive for gait problem. Negative for joint pain, joint pain, myalgias, muscle weakness, morning stiffness, muscle tenderness and myalgias.  Skin:  Positive for color change and rash. Negative for hair loss and sensitivity to sunlight.  Allergic/Immunologic: Negative for susceptible to infections.  Neurological:  Negative for dizziness and headaches.  Hematological:  Negative for swollen glands.  Psychiatric/Behavioral:  Negative for depressed mood and sleep disturbance. The patient is not nervous/anxious.     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   High risk medication use 04/17/2022   Vitamin B 12 deficiency 04/03/2022   Iron deficiency anemia 03/29/2022   Bilateral wrist pain 02/27/2022   Hemarthrosis 02/08/2022   Synovitis of left knee 07/19/2021   Vancomycin adverse reaction 04/19/2021   Therapeutic drug monitoring 04/19/2021   Synovitis of right knee 04/04/2020   Long term (current) use of anticoagulants [Z79.01] 03/14/2016   S/P minimally invasive mitral valve repair 03/06/2016   Pancreatic mass 01/29/2016   Thyroid nodule 01/29/2016   Exertional dyspnea 01/05/2016   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   Mitral insufficiency    Mitral valve prolapse 12/26/2015   Anxiety 11/02/2015    Past Medical History:  Diagnosis Date   Anemia    low iron in the past   Anxiety    pt denies   Arthritis    "back?" (11/03/2015)   Chest pain 11/02/2015   Depression    pt denies   Exertional dyspnea 01/05/2016   GERD (gastroesophageal reflux disease)    Heart murmur    History of peptic ulcer "late 1970's"    Migraine    "maybe 3-4/yr now" (11/03/2015)   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   Complex valvuloplasty including triangular resection of posterior leaflet, artificial Gore-tex neochord placement x6 and 34 mm Memo 3D ring annuloplasty via right mini thoracotomy approach with clipping of LA appendage   Thyroid nodule 01/29/2016   2.4 cm enhancing mixed cystic and solid nodule inferior left thyroid gland noted on CT angiogram    Family History  Problem Relation Age of Onset   Hypertension Mother    Cancer Mother 42       MELANOMA   Cancer Father        PROSTATE   Cancer Sister 87       BREAST   Past Surgical History:  Procedure Laterality Date   BACK SURGERY     BREAST BIOPSY Left ~ 2005   BREAST LUMPECTOMY Left ~ 2005   CARDIAC CATHETERIZATION N/A 01/16/2016   Procedure: Right/Left Heart Cath and Coronary Angiography;  Surgeon: Sanda Klein, MD;  Location: [facility];  Service: Cardiovascular;  Laterality: N/A;   CLIPPING OF ATRIAL APPENDAGE Left 03/06/2016   Procedure: CLIPPING OF LEFT ATRIAL APPENDAGE using a 4 PRO2 AtriClip;  Surgeon: Rexene Alberts, MD;  Location: [facility];  Service: Open Heart Surgery;  Laterality: Left;   COLONOSCOPY     EUS N/A 02/20/2016   Procedure: ESOPHAGEAL ENDOSCOPIC ULTRASOUND (EUS) RADIAL;  Surgeon: Arta Silence, MD;  Location: [facility];  Service: Endoscopy;  Laterality: N/A;   EYE MUSCLE SURGERY Bilateral 1970's?   KNEE ARTHROSCOPY Right 04/04/2020   Procedure: RIGHT KNEE ARTHROSCOPY WITH DEBRIDEMENT;  Surgeon: Meredith Pel, MD;  Location: [facility];   Service: Orthopedics;  Laterality: Right;   KNEE ARTHROSCOPY Right 04/10/2021   Procedure: right knee arthroscopy, debridement, placement of antibiotic beads;  Surgeon: Meredith Pel, MD;  Location: [facility];  Service: Orthopedics;  Laterality: Right;   KNEE ARTHROSCOPY Left 12/10/2021   Procedure: LEFT KNEE ARTHROSCOPY, DEBRIDEMENT, PLACEMENT OF STIMULAN BEADS;  Surgeon: Meredith Pel, MD;  Location: [place];  Service: Orthopedics;  Laterality: Left;   [place]   "trimmed bulges off both sides"   MITRAL VALVE REPAIR Right 03/06/2016   Procedure: MINIMALLY INVASIVE MITRAL VALVE REPAIR (MVR) using a 42 Sorin Memo 3D Ring;  Surgeon: Rexene Alberts, MD;  Location: [facility];  Service: Open Heart Surgery;  Laterality: Right;   TEE WITHOUT CARDIOVERSION N/A 01/05/2016   Procedure: TRANSESOPHAGEAL ECHOCARDIOGRAM (TEE);  Surgeon: Sanda Klein, MD;  Location: [facility];  Service: Cardiovascular;  Laterality: N/A;   TEE WITHOUT CARDIOVERSION N/A 03/06/2016   Procedure: TRANSESOPHAGEAL ECHOCARDIOGRAM (TEE);  Surgeon: Rexene Alberts, MD;  Location: [place];  Service: Open Heart Surgery;  Laterality: N/A;   TUBAL LIGATION     Social History   Social History Narrative   Not on file    There is no immunization history on file for this patient.   Objective: Vital Signs: BP (!) 136/93 (BP Location: Left Arm, Patient Position: Sitting, Cuff Size: Normal)   Pulse 87   Resp 15   Ht '5\' 6"'  (1.676 m) Comment: wheelchair  BMI 23.15 kg/m    Physical Exam Cardiovascular:     Rate and Rhythm: Normal rate and regular rhythm.  Pulmonary:     Effort: Pulmonary effort is normal.     Breath sounds: Normal breath sounds.  Musculoskeletal:     Right lower leg: No edema.     Left lower leg: No edema.  Neurological:     Mental Status: She is alert.  Psychiatric:        Mood and Affect: Mood normal.      Musculoskeletal Exam:  Shoulders full ROM no tenderness or swelling Elbows full  ROM no tenderness or swelling Wrists full ROM no tenderness or swelling Fingers full ROM no tenderness or swelling Right knee warmth and effusion present, extension decreased by about 20 degrees, left knee mild tenderness without palpable effusion Ankles full ROM no tenderness or swelling   CDAI Exam: CDAI Score: 7  Patient Global: 10 mm; Provider Global: 30 mm Swollen: 1 ; Tender: 2  Joint Exam 08/14/2022      Right  Left  Knee  Swollen Tender   Tender     Investigation: No additional findings.  Imaging: No results found.  Recent Labs: Lab Results  Component Value Date   WBC 6.4 08/14/2022   HGB 15.4 08/14/2022   PLT  231 08/14/2022   NA 140 08/14/2022   K 4.5 08/14/2022   CL 105 08/14/2022   CO2 26 08/14/2022   GLUCOSE 104 (H) 08/14/2022   BUN 16 08/14/2022   CREATININE 0.63 08/14/2022   BILITOT 0.3 08/14/2022   ALKPHOS 100 03/29/2022   AST 17 08/14/2022   ALT 13 08/14/2022   PROT 6.0 (L) 08/14/2022   ALBUMIN 3.8 03/29/2022   CALCIUM 9.2 08/14/2022   GFRAA >60 04/04/2020   QFTBGOLDPLUS NEGATIVE 02/08/2022    Speciality Comments: No specialty comments available.  Procedures:  No procedures performed Allergies: Vancomycin and Cyanocobalamin [vitamin b12]   Assessment / Plan:     Visit Diagnoses: Seronegative inflammatory arthritis - Plan: Sedimentation rate, C-reactive protein, sulfaSALAzine (AZULFIDINE) 500 MG tablet  Joint inflammation appears almost completely better in upper extremities, knee inflammation still somewhat active versus chronic synovitis changes. Checking ESR and CRP for disease activity monitoring. Plan to continue SSZ 1000 mg TID.  High risk medication use - Sulfasalazine to 1000 mg 3 times daily. - Plan: CBC with Differential/Platelet, COMPLETE METABOLIC PANEL WITH GFR  Checking CBC and CMP for medication monitoring. No intolerance or complications with current sulfasalazine dose reported.  Synovitis of right  knee Hemarthrosis Synovial hypertrophy of right knee  Upcoming surgery with Dr. Marlou Sa I anticipate a large benefit since her systemic signs of inflammation are so much improved. Also to benefit with PT afterwards with lower extremity strength and mobility decreased.    Orders: Orders Placed This Encounter  Procedures   Sedimentation rate   C-reactive protein   CBC with Differential/Platelet   COMPLETE METABOLIC PANEL WITH GFR   Meds ordered this encounter  Medications   sulfaSALAzine (AZULFIDINE) 500 MG tablet    Sig: Take 2 tablets (1,000 mg total) by mouth 3 (three) times daily    Dispense:  180 tablet    Refill:  2     Follow-Up Instructions: Return in about 3 months (around 11/14/2022) for RA on SSZ f/u 34mo.   CCollier Salina MD  Note - This record has been created using [employer]  Chart creation errors have been sought, but may not always  have been located. Such creation errors do not reflect on  the standard of medical care.

## 2022-08-02 NOTE — Telephone Encounter (Signed)
Referral placed.

## 2022-08-03 ENCOUNTER — Encounter: Payer: Self-pay | Admitting: Orthopedic Surgery

## 2022-08-03 NOTE — Progress Notes (Signed)
Office Visit Note   Patient: Becky Murphy           Date of Birth: August 30, 1951           MRN: [id number] Visit Date: 08/01/2022 Requested by: Kaleen Mask, MD [address] [place],  [place] [address] PCP: Kaleen Mask, MD  Subjective: Chief Complaint  Patient presents with   Right Knee - Pain    HPI: Becky Murphy is a 71 year old patient with bilateral knee pain right worse than left.  Had pain in the knee for 2 years.  Has a history of multiple debridement surgeries in the right knee with no cultures organism obtained.  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  Her last infection studies including sed rate C-reactive protein white count as well as knee aspiration were all negative for infection.  They were normal.  She is not having any fevers or chills but she does have significant pain with weightbearing to the point where she is in a wheelchair.  She is also doing B12 shots.  Taking gabapentin for pain.  Essentially she cannot walk at this time.              ROS: All systems reviewed are negative as they relate to the chief complaint within the history of present illness.  Patient denies  fevers or chills.   Assessment & Plan: Visit Diagnoses:  1. Unilateral primary osteoarthritis, right knee     Plan: Impression is right knee pain with history of multiple debridement surgeries with all cultures negative for any type of bacterial infection.  Unclear etiology at this time but currently she is on treatment for inflammatory arthritis and her numbers are completely normal.  Her biggest problem is the large flexion contracture in the right knee.  Plan for surgery is cemented knee replacement with antibiotics in the cement as well as antibiotics in the knee joint itself.  The risk and benefits of surgical intervention are discussed include not limited to infection or vessel damage incomplete pain relief as well as potential for infection  which if it cannot be eradicated could result in amputation.  All this is discussed with the patient.  She understands and really feels like the risk of surgery are outweighed by the potential benefits of becoming ambulatory again.  All questions answered.  Follow-Up Instructions: No follow-ups on file.   Orders:  No orders of the defined types were placed in this encounter.  No orders of the defined types were placed in this encounter.     Procedures: No procedures performed   Clinical Data: No additional findings.  Objective: Vital Signs: Ht 5\' 6"  (1.676 m)   Wt 143 lb 6.4 oz (65 kg)   BMI 23.15 kg/m   Physical Exam:   Constitutional: Patient appears well-developed HEENT:  Head: Normocephalic Eyes:EOM are normal Neck: Normal range of motion Cardiovascular: Normal rate Pulmonary/chest: Effort normal Neurologic: Patient is alert Skin: Skin is warm Psychiatric: Patient has normal mood and affect   Ortho Exam: Ortho exam demonstrates no warmth or effusion in either knee.  She has flexion past 90 in both knees but a 30 degree flexion contracture on the right 15 degrees on the left.  Extensor mechanism intact.  No proximal end adenopathy on the right.  No groin pain on the right with internal/external Tatian of the leg.  No masses lymphadenopathy or skin changes noted in that right knee region.  Specialty Comments:  No specialty comments  available.  Imaging: No results found.   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   High risk medication use 04/17/2022   Vitamin B 12 deficiency 04/03/2022   Iron deficiency anemia 03/29/2022   Bilateral wrist pain 02/27/2022   Hemarthrosis 02/08/2022   Septic arthritis of knee, left (HCC) 12/10/2021   Synovitis of left knee 07/19/2021   Vancomycin adverse reaction 04/19/2021   Therapeutic drug monitoring 04/19/2021   Synovitis of right knee 04/04/2020    Long term (current) use of anticoagulants [Z79.01] 03/14/2016   S/P minimally invasive mitral valve repair 03/06/2016   Pancreatic mass 01/29/2016   Thyroid nodule 01/29/2016   Exertional dyspnea 01/05/2016   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   Mitral insufficiency    Mitral valve prolapse 12/26/2015   Anxiety 11/02/2015   Past Medical History:  Diagnosis Date   Anemia    low iron in the past   Anxiety    pt denies   Arthritis    "back?" (11/03/2015)   Chest pain 11/02/2015   Depression    pt denies   Exertional dyspnea 01/05/2016   GERD (gastroesophageal reflux disease)    Heart murmur    History of peptic ulcer "late 1970's"   Migraine    "maybe 3-4/yr now" (11/03/2015)   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   Complex valvuloplasty including triangular resection of posterior leaflet, artificial Gore-tex neochord placement x6 and 34 mm Memo 3D ring annuloplasty via right mini thoracotomy approach with clipping of LA appendage   Thyroid nodule 01/29/2016   2.4 cm enhancing mixed cystic and solid nodule inferior left thyroid gland noted on CT angiogram    Family History  Problem Relation Age of Onset   Hypertension Mother    Cancer Mother 22       MELANOMA   Cancer Father        PROSTATE   Cancer Sister 60       BREAST    Past Surgical History:  Procedure Laterality Date   BACK SURGERY     BREAST BIOPSY Left ~ 2005   BREAST LUMPECTOMY Left ~ 2005   CARDIAC CATHETERIZATION N/A 01/16/2016   Procedure: Right/Left Heart Cath and Coronary Angiography;  Surgeon: Thurmon Fair, MD;  [facility]: [facility];  Service: Cardiovascular;  Laterality: N/A;   CLIPPING OF ATRIAL APPENDAGE Left 03/06/2016   Procedure: CLIPPING OF LEFT ATRIAL APPENDAGE using a 45 PRO2 AtriClip;  Surgeon: Purcell Nails, MD;  Location: [facility] OR;  Service: Open Heart Surgery;  Laterality: Left;   COLONOSCOPY     EUS N/A 02/20/2016   Procedure: ESOPHAGEAL ENDOSCOPIC ULTRASOUND (EUS) RADIAL;  Surgeon: Willis Modena, MD;  Location: [facility] END[facility];  Service: Endoscopy;  Laterality: N/A;   EYE MUSCLE SURGERY Bilateral 1970's?   KNEE ARTHROSCOPY Right 04/04/2020   Procedure: RIGHT KNEE ARTHROSCOPY WITH DEBRIDEMENT;  Surgeon: Cammy Copa, MD;  Location: [facility] OR;  Service: Orthopedics;  Laterality: Right;   KNEE ARTHROSCOPY Right 04/10/2021   Procedure: right knee arthroscopy, debridement, placement of antibiotic  beads;  Surgeon: Cammy Copa, MD;  Location: [facility] OR;  Service: Orthopedics;  Laterality: Right;   KNEE ARTHROSCOPY Left 12/10/2021   Procedure: LEFT KNEE ARTHROSCOPY, DEBRIDEMENT, PLACEMENT OF STIMULAN BEADS;  Surgeon: Cammy Copa, MD;  Location: [facility] OR;  Service: Orthopedics;  Laterality: Left;   LUMBAR DISC SURGERY  1992   "trimmed bulges off both sides"   MITRAL VALVE REPAIR Right 03/06/2016   Procedure: MINIMALLY INVASIVE MITRAL VALVE REPAIR (MVR) using a 34 Sorin Memo 3D Ring;  Surgeon: Purcell Nails, MD;  Location: [facility] OR;  Service: Open Heart Surgery;  Laterality: Right;   TEE WITHOUT CARDIOVERSION N/A 01/05/2016   Procedure: TRANSESOPHAGEAL ECHOCARDIOGRAM (TEE);  Surgeon: Thurmon Fair, MD;  [facility] ENDOSCOPY;  Service: Cardiovascular;  Laterality: N/A;   TEE WITHOUT CARDIOVERSION N/A 03/06/2016   Procedure: TRANSESOPHAGEAL ECHOCARDIOGRAM (TEE);  Surgeon: Purcell Nails, MD;  Location: [facility] OR;  Service: Open Heart Surgery;  Laterality: N/A;   TUBAL LIGATION     Social History   Occupational History   Not on file  Tobacco Use   Smoking status: Never     Passive exposure: Current   Smokeless tobacco: Never  Vaping Use   Vaping Use: Never used  Substance and Sexual Activity   Alcohol use: Yes    Alcohol/week: 10.0 standard drinks of alcohol    Types: 6 Glasses of wine, 4 Shots of liquor per week    Comment: wine every other day   Drug use: No   Sexual activity: Not Currently    Birth control/protection: Post-menopausal

## 2022-08-14 ENCOUNTER — Ambulatory Visit: Payer: Medicare Other | Attending: Internal Medicine | Admitting: Internal Medicine

## 2022-08-14 ENCOUNTER — Encounter: Payer: Self-pay | Admitting: Internal Medicine

## 2022-08-14 VITALS — BP 136/93 | HR 87 | Resp 15 | Ht 66.0 in

## 2022-08-14 DIAGNOSIS — M67261 Synovial hypertrophy, not elsewhere classified, right lower leg: Secondary | ICD-10-CM | POA: Diagnosis present

## 2022-08-14 DIAGNOSIS — M2500 Hemarthrosis, unspecified joint: Secondary | ICD-10-CM | POA: Diagnosis present

## 2022-08-14 DIAGNOSIS — M1380 Other specified arthritis, unspecified site: Secondary | ICD-10-CM | POA: Insufficient documentation

## 2022-08-14 DIAGNOSIS — Z79899 Other long term (current) drug therapy: Secondary | ICD-10-CM | POA: Diagnosis present

## 2022-08-14 DIAGNOSIS — E538 Deficiency of other specified B group vitamins: Secondary | ICD-10-CM | POA: Diagnosis present

## 2022-08-14 DIAGNOSIS — M659 Synovitis and tenosynovitis, unspecified: Secondary | ICD-10-CM | POA: Diagnosis present

## 2022-08-14 MED ORDER — SULFASALAZINE 500 MG PO TABS: ORAL_TABLET | ORAL | 2 refills | Status: DC

## 2022-08-14 NOTE — Patient Instructions (Signed)
There is no problem to continue sulfasalazine at the same dose before and after surgery.

## 2022-08-15 LAB — COMPLETE METABOLIC PANEL WITH GFR
AG Ratio: 2.3 (calc) (ref 1.0–2.5)
ALT: 13 U/L (ref 6–29)
AST: 17 U/L (ref 10–35)
Albumin: 4.2 g/dL (ref 3.6–5.1)
Alkaline phosphatase (APISO): 93 U/L (ref 37–153)
BUN: 16 mg/dL (ref 7–25)
CO2: 26 mmol/L (ref 20–32)
Calcium: 9.2 mg/dL (ref 8.6–10.4)
Chloride: 105 mmol/L (ref 98–110)
Creat: 0.63 mg/dL (ref 0.60–1.00)
Globulin: 1.8 g/dL (calc) — ABNORMAL LOW (ref 1.9–3.7)
Glucose, Bld: 104 mg/dL — ABNORMAL HIGH (ref 65–99)
Potassium: 4.5 mmol/L (ref 3.5–5.3)
Sodium: 140 mmol/L (ref 135–146)
Total Bilirubin: 0.3 mg/dL (ref 0.2–1.2)
Total Protein: 6 g/dL — ABNORMAL LOW (ref 6.1–8.1)
eGFR: 95 mL/min/{1.73_m2} (ref >=60)

## 2022-08-15 LAB — SEDIMENTATION RATE: Sed Rate: 2 mm/h (ref 0–30)

## 2022-08-15 LAB — CBC WITH DIFFERENTIAL/PLATELET
Absolute Monocytes: 704 cells/uL (ref 200–950)
Basophils Absolute: 102 cells/uL (ref 0–200)
Basophils Relative: 1.6 %
Eosinophils Absolute: 211 cells/uL (ref 15–500)
Eosinophils Relative: 3.3 %
HCT: 45.8 % — ABNORMAL HIGH (ref 35.0–45.0)
Hemoglobin: 15.4 g/dL (ref 11.7–15.5)
Lymphs Abs: 1216 cells/uL (ref 850–3900)
MCH: 31.2 pg (ref 27.0–33.0)
MCHC: 33.6 g/dL (ref 32.0–36.0)
MCV: 92.7 fL (ref 80.0–100.0)
MPV: 10.4 fL (ref 7.5–12.5)
Monocytes Relative: 11 %
Neutro Abs: 4166 cells/uL (ref 1500–7800)
Neutrophils Relative %: 65.1 %
Platelets: 231 10*3/uL (ref 140–400)
RBC: 4.94 10*6/uL (ref 3.80–5.10)
RDW: 15.2 % — ABNORMAL HIGH (ref 11.0–15.0)
Total Lymphocyte: 19 %
WBC: 6.4 10*3/uL (ref 3.8–10.8)

## 2022-08-15 LAB — C-REACTIVE PROTEIN: CRP: 3.1 mg/L (ref <8.0)

## 2022-08-15 NOTE — Progress Notes (Signed)
Lab results look good no problem continuing current sulfasalazine. Her inflammation markers remain normal.

## 2022-08-21 ENCOUNTER — Inpatient Hospital Stay: Payer: Medicare Other

## 2022-08-21 ENCOUNTER — Inpatient Hospital Stay: Payer: Medicare Other | Attending: Hematology | Admitting: Hematology and Oncology

## 2022-08-21 ENCOUNTER — Other Ambulatory Visit: Payer: Self-pay

## 2022-08-21 DIAGNOSIS — R21 Rash and other nonspecific skin eruption: Secondary | ICD-10-CM | POA: Diagnosis not present

## 2022-08-21 DIAGNOSIS — E538 Deficiency of other specified B group vitamins: Secondary | ICD-10-CM | POA: Diagnosis present

## 2022-08-21 MED ORDER — CYANOCOBALAMIN 1000 MCG/ML IJ SOLN
1000.0000 ug | Freq: Once | INTRAMUSCULAR | Status: DC
  Filled 2022-08-21: qty 1

## 2022-08-21 NOTE — Assessment & Plan Note (Signed)
Patient has been on B12 injections since April 2023. Over the past months she has developed worsening rashes after each injection. Therefore we discontinued B12 injection Recommended that she take over-the-counter B12 supplement sublingual 5000 mcg daily.

## 2022-08-21 NOTE — Progress Notes (Signed)
Patient Care Team: Kaleen Mask, MD as PCP - General (Family Medicine)  DIAGNOSIS: Rash from B12 injections  CHIEF COMPLIANT: Follow-up on rash  INTERVAL HISTORY: Becky Murphy is a 71 yr old female is here today because orf reaction from the B-12 injections. She complains of a rash on arms and on neck. She states that it is driving her crazy. The whelps has went down. She ice packs seems to help. She believes it is coming from the B-12 injection. Denies been out in the garden.   ALLERGIES:  is allergic to vancomycin and cyanocobalamin [vitamin b12].  MEDICATIONS:  Current Outpatient Medications  Medication Sig Dispense Refill   aspirin 325 MG EC tablet Take 325 mg by mouth daily as needed for pain.     cholecalciferol (VITAMIN D3) 25 MCG (1000 UNIT) tablet Take 1,000 Units by mouth daily.     diphenhydrAMINE (BENADRYL) 25 MG tablet Take 25 mg by mouth daily as needed for allergies.     gabapentin (NEURONTIN) 300 MG capsule Take 900 mg by mouth at bedtime.     Menthol, Topical Analgesic, ([employer] COLD & HOT MEDICATED EX) Apply 1 application topically at bedtime as needed (pain).     Methylcobalamin (B-12 FAST DISSOLVE) 5000 MCG SUBL Place 5,000 mcg under the tongue daily.     Multiple Vitamins-Minerals (MULTIVITAMIN WITH MINERALS) tablet Take 1 tablet by mouth daily.     sulfaSALAzine (AZULFIDINE) 500 MG tablet Take 2 tablets (1,000 mg total) by mouth 3 (three) times daily 180 tablet 2   No current facility-administered medications for this visit.    PHYSICAL EXAMINATION: ECOG PERFORMANCE STATUS: 1 - Symptomatic but completely ambulatory  Vitals:   08/21/22 1057  BP: 134/68  Pulse: 90  Resp: 18  Temp: 97.7 F (36.5 C)  SpO2: 96%   Filed Weights      LABORATORY DATA:  I have reviewed the data as listed    Latest Ref Rng & Units 08/14/2022    3:18 PM 04/17/2022    1:40 PM 03/29/2022    2:20 PM  CMP  Glucose 65 - 99 mg/dL 115  97  520   BUN 7 - 25 mg/dL 16  8   10    Creatinine 0.60 - 1.00 mg/dL  8.02  2.33   Sodium 135 - 146 mmol/L 140  143  143   Potassium 3.5 - 5.3 mmol/L 4.5  3.9  3.7   Chloride 98 - 110 mmol/L 105  105  106   CO2 20 - 32 mmol/L 26  25  26    Calcium 8.6 - 10.4 mg/dL 9.2  9.0  9.2   Total Protein 6.1 - 8.1 g/dL 6.0  6.4  7.4   Total Bilirubin 0.2 - 1.2 mg/dL 0.3  0.5  0.3   Alkaline Phos 38 - 126 U/L   100   AST 10 - 35 U/L 17  13  11    ALT 6 - 29 U/L 13  6  <5     Lab Results  Component Value Date   WBC 6.4 08/14/2022   HGB 15.4 08/14/2022   HCT 45.8 (H) 08/14/2022   MCV 92.7 08/14/2022   PLT 231 08/14/2022   NEUTROABS 4,166 08/14/2022    ASSESSMENT & PLAN:  Vitamin B 12 deficiency Patient has been on B12 injections since April 2023. Over the past months she has developed worsening rashes after each injection. Therefore we discontinued B12 injection Recommended that she take over-the-counter B12  supplement sublingual 5000 mcg daily. Return to clinic in 6 months with labs and follow-up.   No orders of the defined types were placed in this encounter.  The patient has a good understanding of the overall plan. she agrees with it. she will call with any problems that may develop before the next visit here. Total time spent: 30 mins including face to face time and time spent for planning, charting and co-ordination of care   Tamsen Meek, MD 08/21/22    I Janan Ridge am scribing for Dr. Pamelia Hoit  I have reviewed the above documentation for accuracy and completeness, and I agree with the above.

## 2022-08-26 ENCOUNTER — Encounter: Payer: Self-pay | Admitting: Internal Medicine

## 2022-08-26 ENCOUNTER — Ambulatory Visit: Payer: Medicare Other | Attending: Internal Medicine | Admitting: Internal Medicine

## 2022-08-26 VITALS — BP 122/80 | HR 79 | Ht 66.0 in | Wt 143.5 lb

## 2022-08-26 DIAGNOSIS — I059 Rheumatic mitral valve disease, unspecified: Secondary | ICD-10-CM | POA: Insufficient documentation

## 2022-08-26 DIAGNOSIS — Z9889 Other specified postprocedural states: Secondary | ICD-10-CM | POA: Diagnosis not present

## 2022-08-26 NOTE — Patient Instructions (Signed)
Medication Instructions:  The current medical regimen is effective;  continue present plan and medications.  *If you need a refill on your cardiac medications before your next appointment, please call your pharmacy*   Testing/Procedures: Echocardiogram - Your physician has requested that you have an echocardiogram. Echocardiography is a painless test that uses sound waves to create images of your heart. It provides your doctor with information about the size and shape of your heart and how well your heart's chambers and valves are working. This procedure takes approximately one hour. There are no restrictions for this procedure.     Follow-Up: At [facility], you and your health needs are our priority.  As part of our continuing mission to provide you with exceptional heart care, we have created designated Provider Care Teams.  These Care Teams include your primary Cardiologist (physician) and Advanced Practice Providers (APPs -  Physician Assistants and Nurse Practitioners) who all work together to provide you with the care you need, when you need it.  We recommend signing up for the patient portal called "MyChart".  Sign up information is provided on this After Visit Summary.  MyChart is used to connect with patients for Virtual Visits (Telemedicine).  Patients are able to view lab/test results, encounter notes, upcoming appointments, etc.  Non-urgent messages can be sent to your provider as well.   To learn more about what you can do with MyChart, go to [email or web address].    Your next appointment:   6 month(s)  The format for your next appointment:   In Person  Provider:   Carolan Clines, MD

## 2022-08-26 NOTE — Progress Notes (Signed)
Cardiology Office Note:    Date:  08/26/2022   ID:  Becky Murphy, DOB [birth year], MRN [id number]  PCP:  Kaleen Mask, MD   [facility] Providers Cardiologist:  None     Referring MD: Cammy Copa, MD   No chief complaint on file. PreOp evaluation  History of Present Illness:    Becky Murphy is a 71 y.o. female with a hx of GERD, former patient of Dr. Royann Shivers with hx of MVP s/p mitral valve repair 03/06/2016 with Dr. Cornelius Moras (s/p partial resection of the posterior leaflet, artificial neo-cord placements and a 34 mm annuloplasty ring) and clipping of the LAA referral for pre-op for R total knee arthroplasty.  She presents for pre-op for total knee arthroplasty. She has not seen cardiology since 2017.  She can only stand briefly, but she cannot go up stairs. No smoking. No DM2. No premature CAD. No smoking hx.  She denies activity limiting DOE, CP. No CHF signs.    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   Complex valvuloplasty  including triangular resection of posterior leaflet, artificial Gore-tex neochord placement x6 and 34 mm Memo 3D ring annuloplasty via right mini thoracotomy approach with clipping of LA appendage   Thyroid nodule 01/29/2016   2.4 cm enhancing mixed cystic and solid nodule inferior left thyroid gland noted on CT angiogram    Past Surgical History:  Procedure Laterality Date   BACK SURGERY     BREAST BIOPSY Left ~ 2005   BREAST LUMPECTOMY Left ~ 2005   CARDIAC CATHETERIZATION N/A 01/16/2016   Procedure: Right/Left Heart Cath and Coronary Angiography;  Surgeon: Thurmon Fair, MD;  Location: [facility];  Service: Cardiovascular;  Laterality: N/A;   CLIPPING OF ATRIAL APPENDAGE Left 03/06/2016   Procedure: CLIPPING OF LEFT ATRIAL APPENDAGE using a 45 PRO2 AtriClip;  Surgeon: Purcell Nails, MD;  Location: [facility] OR;  Service: Open Heart Surgery;  Laterality: Left;   COLONOSCOPY     EUS N/A 02/20/2016   Procedure: ESOPHAGEAL ENDOSCOPIC ULTRASOUND (EUS) RADIAL;  Surgeon: Willis Modena, MD;  Location: [facility] ENDOSCOPY;  Service: Endoscopy;  Laterality: N/A;   EYE MUSCLE SURGERY Bilateral 1970's?   KNEE ARTHROSCOPY Right 04/04/2020   Procedure: RIGHT KNEE ARTHROSCOPY WITH DEBRIDEMENT;  Surgeon: Cammy Copa, MD;  Location: [facility] OR;  Service: Orthopedics;  Laterality: Right;   KNEE ARTHROSCOPY Right 04/10/2021   Procedure: right knee arthroscopy, debridement, placement of antibiotic beads;  Surgeon: Cammy Copa, MD;  Location: [facility] OR;  Service: Orthopedics;  Laterality: Right;   KNEE ARTHROSCOPY Left 12/10/2021   Procedure: LEFT KNEE ARTHROSCOPY, DEBRIDEMENT, PLACEMENT OF STIMULAN BEADS;  Surgeon: Cammy Copa, MD;  Location: [facility] OR;  Service: Orthopedics;  Laterality: Left;   LUMBAR DISC SURGERY  1992   "trimmed bulges off both sides"   MITRAL VALVE REPAIR Right 03/06/2016   Procedure: MINIMALLY INVASIVE MITRAL VALVE REPAIR (MVR) using a 34 Sorin Memo 3D Ring;  Surgeon: Purcell Nails, MD;  Location: [facility] OR;  Service: Open Heart Surgery;  Laterality: Right;   TEE WITHOUT CARDIOVERSION N/A 01/05/2016   Procedure: TRANSESOPHAGEAL ECHOCARDIOGRAM (TEE);  Surgeon: Thurmon Fair, MD;  Location: [facility] ENDOSCOPY;  Service: Cardiovascular;  Laterality: N/A;   TEE WITHOUT CARDIOVERSION N/A 03/06/2016   Procedure: TRANSESOPHAGEAL ECHOCARDIOGRAM (TEE);  Surgeon: Purcell Nails, MD;  Location: [facility] OR;  Service: Open Heart Surgery;  Laterality: N/A;   TUBAL LIGATION      Current Medications: Current Meds  Medication Sig   aspirin 325 MG EC tablet Take 325 mg by mouth daily as needed for pain.   cholecalciferol (VITAMIN D3) 25 MCG (1000 UNIT) tablet Take 1,000 Units by mouth daily.   diphenhydrAMINE (BENADRYL) 25 MG tablet Take 25 mg by mouth daily as needed for allergies.   gabapentin (NEURONTIN) 300 MG capsule Take 900 mg by mouth at bedtime.   Methylcobalamin (B-12 FAST DISSOLVE) 5000 MCG SUBL Place 5,000 mcg under the tongue daily.   sulfaSALAzine (AZULFIDINE) 500 MG tablet Take 2 tablets (1,000 mg total) by mouth 3 (three) times daily     Allergies:   Vancomycin and Cyanocobalamin [vitamin b12]   Social History   Socioeconomic History   Marital status: Married    Spouse name: Not on file   Number of children: Not on file   Years of education: Not on file   Highest education level: Not on file  Occupational History   Not on file  Tobacco Use   Smoking status: Never    Passive exposure: Current   Smokeless tobacco: Never  Vaping Use   Vaping Use: Never used  Substance and Sexual Activity   Alcohol use: Yes    Alcohol/week: 10.0 standard drinks of alcohol    Types: 6 Glasses of wine, 4 Shots of liquor per week    Comment: wine every other day   Drug use: No   Sexual activity: Not Currently    Birth control/protection: Post-menopausal  Other Topics Concern   Not on file  Social History Narrative   Not on file   Social Determinants of Health   Financial Resource  Strain: Not on file  Food Insecurity: Not on file  Transportation Needs: Not on file  Physical Activity: Not on file  Stress: Not on file  Social Connections: Not on file     Family History: The patient's family history includes Cancer in her father; Cancer (age of onset: 80) in her sister; Cancer (age of onset: 73) in her mother; Hypertension in her mother.  ROS:   Please see the history of present illness.     All other systems reviewed and are negative.  EKGs/Labs/Other Studies Reviewed:    The following studies were reviewed today:   EKG:  EKG is  ordered today.  The ekg ordered today demonstrates   08/26/2022- NSR, normal ecg.  Recent Labs: 03/29/2022: TSH 3.030 08/14/2022: ALT 13; BUN 16; Creat 0.63; Hemoglobin 15.4; Platelets 231; Potassium  4.5; Sodium 140  Recent Lipid Panel No results found for: "CHOL", "TRIG", "HDL", "CHOLHDL", "VLDL", "LDLCALC", "LDLDIRECT"   Risk Assessment/Calculations:     Physical Exam:    VS:  BP 122/80   Pulse 79   Ht 5\' 6"  (1.676 m)   Wt 143 lb 8 oz (65.1 kg)   SpO2 97%   BMI 23.16 kg/m     Wt Readings from Last 3 Encounters:  08/26/22 143 lb 8 oz (65.1 kg)  08/01/22 143 lb 6.4 oz (65 kg)  04/17/22 143 lb (64.9 kg)     GEN:  Well nourished, well developed in no acute distress HEENT: Normal LYMPHATICS: No lymphadenopathy CARDIAC: RRR, no murmurs, rubs, gallops RESPIRATORY:  Clear to auscultation without rales, wheezing or rhonchi  ABDOMEN: Soft, non-tender, non-distended MUSCULOSKELETAL:  No edema; No deformity  SKIN: Warm and dry NEUROLOGIC:  Alert and oriented x 3 PSYCHIATRIC:  Normal affect   ASSESSMENT:    Pre-Op: RCRi Class 1 Risk. She is low risk and ischemic evaluation for cardiac risk stratification is not a necessity prior to her procedure. She is acceptable cardiac risk for total knee arthroplasty.   S/p MV Repair: will get FU TTE. She can have her procedure prior to the echo PLAN:    In order of problems  listed above:  She is acceptable cardiac risk for low risk R total knee arthroplasty TTE Follow up 6 months      Medication Adjustments/Labs and Tests Ordered: Current medicines are reviewed at length with the patient today.  Concerns regarding medicines are outlined above.  Orders Placed This Encounter  Procedures   EKG 12-Lead   ECHOCARDIOGRAM COMPLETE   No orders of the defined types were placed in this encounter.   Patient Instructions  Medication Instructions:  The current medical regimen is effective;  continue present plan and medications.  *If you need a refill on your cardiac medications before your next appointment, please call your pharmacy*   Testing/Procedures: Echocardiogram - Your physician has requested that you have an echocardiogram. Echocardiography is a painless test that uses sound waves to create images of your heart. It provides your doctor with information about the size and shape of your heart and how well your heart's chambers and valves are working. This procedure takes approximately one hour. There are no restrictions for this procedure.     Follow-Up: At [facility], you and your health needs are our priority.  As part of our continuing mission to provide you with exceptional heart care, we have created designated Provider Care Teams.  These Care Teams include your primary Cardiologist (physician) and Advanced Practice Providers (APPs -  Physician Assistants and Nurse Practitioners) who all work together to provide you with the care you need, when you need it.  We recommend signing up for the patient portal called "MyChart".  Sign up information is provided on this After Visit Summary.  MyChart is used to connect with patients for Virtual Visits (Telemedicine).  Patients are able to view lab/test results, encounter notes, upcoming appointments, etc.  Non-urgent messages can be sent to your provider as well.   To learn more about what you can do  with MyChart, go to [facility].    Your next appointment:   6 month(s)  The format for your next appointment:   In Person  Provider:   [email or web address], MD           Signed, Carolan Clines, MD  08/26/2022 9:06 AM  [place]

## 2022-08-27 NOTE — Pre-Procedure Instructions (Signed)
Surgical Instructions    Your procedure is scheduled on Tuesday 09/03/22.   Report to [facility] - Inpatient Main Entrance "A" at 05:30 A.M., then check in with the Admitting office.  Call this number if you have problems the morning of surgery:  [phone]   If you have any questions prior to your surgery date call [phone]: Open Monday-Friday 8am-4pm    Remember:  Do not eat after midnight the night before your surgery  You may drink clear liquids until 04:30 A.M. the morning of your surgery.   Clear liquids allowed are: Water, Non-Citrus Juices (without pulp), Carbonated Beverages, Clear Tea, Black Coffee ONLY (NO MILK, CREAM OR POWDERED CREAMER of any kind), and Gatorade  Patient Instructions  The night before surgery:  No food after midnight. ONLY clear liquids after midnight  The day of surgery (if you do NOT have diabetes):  Drink ONE (1) Pre-Surgery Clear Ensure by 04:30 A.M. the morning of surgery. Drink in one sitting. Do not sip.  This drink was given to you during your hospital  pre-op appointment visit.  Nothing else to drink after completing the  Pre-Surgery Clear Ensure.         If you have questions, please contact your surgeon's office.     Take these medicines the morning of surgery with A SIP OF WATER:    NONE   As of today, STOP taking any Aspirin (unless otherwise instructed by your surgeon) Aleve, Naproxen, Ibuprofen, Motrin, Advil, Goody's, BC's, all herbal medications, fish oil, and all vitamins.           Do not wear jewelry or makeup. Do not wear lotions, powders, perfumes/cologne or deodorant. Do not shave 48 hours prior to surgery.  Men may shave face and neck. Do not bring valuables to the hospital. Do not wear nail polish, gel polish, artificial nails, or any other type of covering on natural nails (fingers and toes) If you have artificial nails or gel coating that need to be removed by a nail salon, please have this removed prior to surgery.  Artificial nails or gel coating may interfere with anesthesia's ability to adequately monitor your vital signs.  [facility] is not responsible for any belongings or valuables.    Do NOT Smoke (Tobacco/Vaping)  24 hours prior to your procedure  If you use a CPAP at night, you may bring your mask for your overnight stay.   Contacts, glasses, hearing aids, dentures or partials may not be worn into surgery, please bring cases for these belongings   For patients admitted to the hospital, discharge time will be determined by your treatment team.   Patients discharged the day of surgery will not be allowed to drive home, and someone needs to stay with them for 24 hours.   SURGICAL WAITING ROOM VISITATION Patients having surgery or a procedure may have no more than 2 support people in the waiting area - these visitors may rotate.   Children under the age of 18 must have an adult with them who is not the patient. If the patient needs to stay at the hospital during part of their recovery, the visitor guidelines for inpatient rooms apply. Pre-op nurse will coordinate an appropriate time for 1 support person to accompany patient in pre-op.  This support person may not rotate.   Please refer to the [facility] website for the visitor guidelines for Inpatients (after your surgery is over and you are in a regular room).    Special instructions:  Oral Hygiene is also important to reduce your risk of infection.  Remember - BRUSH YOUR TEETH THE MORNING OF SURGERY WITH YOUR REGULAR TOOTHPASTE   [place]- Preparing For Surgery  Before surgery, you can play an important role. Because skin is not sterile, your skin needs to be as free of germs as possible. You can reduce the number of germs on your skin by washing with CHG (chlorahexidine gluconate) Soap before surgery.  CHG is an antiseptic cleaner which kills germs and bonds with the skin to continue killing germs even after washing.     Please do  not use if you have an allergy to CHG or antibacterial soaps. If your skin becomes reddened/irritated stop using the CHG.  Do not shave (including legs and underarms) for at least 48 hours prior to first CHG shower. It is OK to shave your face.  Please follow these instructions carefully.     Shower the NIGHT BEFORE SURGERY and the MORNING OF SURGERY with CHG Soap.   If you chose to wash your hair, wash your hair first as usual with your normal shampoo. After you shampoo, rinse your hair and body thoroughly to remove the shampoo.  Then [employer] and genitals (private parts) with your normal soap and rinse thoroughly to remove soap.  After that Use CHG Soap as you would any other liquid soap. You can apply CHG directly to the skin and wash gently with a scrungie or a clean washcloth.   Apply the CHG Soap to your body ONLY FROM THE NECK DOWN.  Do not use on open wounds or open sores. Avoid contact with your eyes, ears, mouth and genitals (private parts). Wash Face and genitals (private parts)  with your normal soap.   Wash thoroughly, paying special attention to the area where your surgery will be performed.  Thoroughly rinse your body with warm water from the neck down.  DO NOT shower/wash with your normal soap after using and rinsing off the CHG Soap.  Pat yourself dry with a CLEAN TOWEL.  Wear CLEAN PAJAMAS to bed the night before surgery  Place CLEAN SHEETS on your bed the night before your surgery  DO NOT SLEEP WITH PETS.   Day of Surgery:  Take a shower with CHG soap. Wear Clean/Comfortable clothing the morning of surgery Do not apply any deodorants/lotions.   Remember to brush your teeth WITH YOUR REGULAR TOOTHPASTE.    If you received a COVID test during your pre-op visit, it is requested that you wear a mask when out in public, stay away from anyone that may not be feeling well, and notify your surgeon if you develop symptoms. If you have been in contact with anyone that  has tested positive in the last 10 days, please notify your surgeon.    Please read over the following fact sheets that you were given.

## 2022-08-28 ENCOUNTER — Encounter (HOSPITAL_COMMUNITY)
Admission: RE | Admit: 2022-08-28 | Discharge: 2022-08-28 | Disposition: A | Discharge status: Home / Self Care | Payer: Medicare Other | Source: Ambulatory Visit | Attending: Orthopedic Surgery | Admitting: Orthopedic Surgery

## 2022-08-28 ENCOUNTER — Encounter (HOSPITAL_COMMUNITY): Payer: Self-pay

## 2022-08-28 ENCOUNTER — Other Ambulatory Visit: Payer: Self-pay

## 2022-08-28 VITALS — BP 137/95 | HR 79 | Temp 98.9°F | Resp 17 | Ht 66.0 in | Wt 143.0 lb

## 2022-08-28 DIAGNOSIS — B9620 Unspecified Escherichia coli [E. coli] as the cause of diseases classified elsewhere: Secondary | ICD-10-CM | POA: Insufficient documentation

## 2022-08-28 DIAGNOSIS — Z01818 Encounter for other preprocedural examination: Secondary | ICD-10-CM

## 2022-08-28 DIAGNOSIS — M659 Synovitis and tenosynovitis, unspecified: Secondary | ICD-10-CM

## 2022-08-28 DIAGNOSIS — M65961 Unspecified synovitis and tenosynovitis, right lower leg: Secondary | ICD-10-CM

## 2022-08-28 DIAGNOSIS — Z952 Presence of prosthetic heart valve: Secondary | ICD-10-CM | POA: Insufficient documentation

## 2022-08-28 DIAGNOSIS — R8271 Bacteriuria: Secondary | ICD-10-CM | POA: Insufficient documentation

## 2022-08-28 LAB — BASIC METABOLIC PANEL
Anion gap: 10 (ref 5–15)
BUN: 10 mg/dL (ref 8–23)
CO2: 24 mmol/L (ref 22–32)
Calcium: 9.3 mg/dL (ref 8.9–10.3)
Chloride: 108 mmol/L (ref 98–111)
Creatinine, Ser: 0.72 mg/dL (ref 0.44–1.00)
GFR, Estimated: >60 mL/min (ref >60)
Glucose, Bld: 100 mg/dL — ABNORMAL HIGH (ref 70–99)
Potassium: 3.9 mmol/L (ref 3.5–5.1)
Sodium: 142 mmol/L (ref 135–145)

## 2022-08-28 LAB — CBC
HCT: 46.6 % — ABNORMAL HIGH (ref 36.0–46.0)
Hemoglobin: 16.1 g/dL — ABNORMAL HIGH (ref 12.0–15.0)
MCH: 32.1 pg (ref 26.0–34.0)
MCHC: 34.5 g/dL (ref 30.0–36.0)
MCV: 92.8 fL (ref 80.0–100.0)
Platelets: 197 10*3/uL (ref 150–400)
RBC: 5.02 MIL/uL (ref 3.87–5.11)
RDW: 14.5 % (ref 11.5–15.5)
WBC: 6.7 10*3/uL (ref 4.0–10.5)
nRBC: 0 % (ref 0.0–0.2)

## 2022-08-28 LAB — URINALYSIS, COMPLETE (UACMP) WITH MICROSCOPIC
Bilirubin Urine: NEGATIVE
Glucose, UA: NEGATIVE mg/dL
Hgb urine dipstick: NEGATIVE
Ketones, ur: NEGATIVE mg/dL
Leukocytes,Ua: NEGATIVE
Nitrite: NEGATIVE
Protein, ur: NEGATIVE mg/dL
Specific Gravity, Urine: 1.008 (ref 1.005–1.030)
pH: 7 (ref 5.0–8.0)

## 2022-08-28 LAB — SURGICAL PCR SCREEN
MRSA, PCR: NEGATIVE
Staphylococcus aureus: NEGATIVE

## 2022-08-28 NOTE — Progress Notes (Addendum)
PCP - [facility] Cardiologist - Carolan Clines  PPM/ICD - denies  Chest x-ray - n/a EKG - 08/26/22 Stress Test - n/a ECHO - n/a Cardiac Cath - 01/16/16  Sleep Study - denies  No diabetes.  Blood Thinner Instructions: Aspirin Instructions:  ERAS Protcol - yes PRE-SURGERY Ensure or G2- Ensure  COVID TEST- n/a  Anesthesia review: yes; cardiac history  Patient denies shortness of breath, fever, cough and chest pain at PAT appointment  All instructions explained to the patient, with a verbal understanding of the material. Patient agrees to go over the instructions while at home for a better understanding. Patient also instructed to self quarantine after being tested for COVID-19. The opportunity to ask questions was provided.

## 2022-08-29 NOTE — Progress Notes (Signed)
Anesthesia Chart Review:  Follows cardiology for history of MVP s/p mitral valve repair 2017 and clipping of the left atrial appendage.  She reestablished with cardiologist Dr. Carolan Clines on 08/26/2022 for preop evaluation.  Per note, "Pre-Op: RCRi Class 1 Risk. She is low risk and ischemic evaluation for cardiac risk stratification is not a necessity prior to her procedure. She is acceptable cardiac risk for total knee arthroplasty. S/p MV Repair: will get FU TTE. 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  She is maintained on sulfasalazine.  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  Last seen 03/13/2021 with repeat imaging and was recommended to continue surveillance at that time.  Preop labs reviewed, unremarkable.  EKG 08/26/2022: NSR.  Rate 79.  TTE 07/05/2016: - Left ventricle: The cavity size was normal. Systolic function was    normal. The estimated ejection fraction was in the range of 55%    to 60%. Wall motion was normal; there were no regional wall    motion abnormalities. The study is not technically sufficient to    allow evaluation of LV diastolic function.  - Mitral valve: Post MV repair with trivial residual MR.  - Left atrium: The atrium was mildly dilated.  - Atrial septum: No defect or patent foramen ovale  Right/left heart cath 01/16/2016: IMPRESSIONS:  Moderate (possibly severe, eccentric) mitral insufficiency. Normal coronary arteries. Normal hemodynamics.   RECOMMENDATION:  Monitor closely, will eventually require referral for MV repair.   Zannie Cove [facility] Short Stay Center/Anesthesiology Phone [phone] 08/29/2022 2:05 PM

## 2022-08-29 NOTE — Anesthesia Preprocedure Evaluation (Addendum)
Anesthesia Evaluation  Patient identified by MRN, date of birth, ID band Patient awake    Reviewed: Allergy & Precautions, H&P , NPO status , Patient's Chart, lab work & pertinent test results  History of Anesthesia Complications (+) PONV and history of anesthetic complications  Airway Mallampati: II   Neck ROM: full    Dental   Pulmonary neg pulmonary ROS,    breath sounds clear to auscultation       Cardiovascular + Valvular Problems/Murmurs  Rhythm:regular Rate:Normal  S/p mitral valve repair   Neuro/Psych  Headaches, PSYCHIATRIC DISORDERS Anxiety Depression    GI/Hepatic GERD  ,  Endo/Other    Renal/GU      Musculoskeletal  (+) Arthritis ,   Abdominal   Peds  Hematology   Anesthesia Other Findings   Reproductive/Obstetrics                             Anesthesia Physical Anesthesia Plan  ASA: 3  Anesthesia Plan: General   Post-op Pain Management: Regional block*   Induction:   PONV Risk Score and Plan: 4 or greater and Ondansetron, Dexamethasone, Midazolam and Treatment may vary due to age or medical condition  Airway Management Planned: LMA  Additional Equipment:   Intra-op Plan:   Post-operative Plan: Extubation in OR  Informed Consent: I have reviewed the patients History and Physical, chart, labs and discussed the procedure including the risks, benefits and alternatives for the proposed anesthesia with the patient or authorized representative who has indicated his/her understanding and acceptance.     Dental advisory given  Plan Discussed with: CRNA, Anesthesiologist and Surgeon  Anesthesia Plan Comments: (PAT note by Antionette Poles, PA-C:  Follows cardiology for history of MVP s/p mitral valve repair 2017 and clipping of the left atrial appendage.  She reestablished with cardiologist Dr. Carolan Clines on 08/26/2022 for preop evaluation.  Per note, "Pre-Op: RCRi Class 1  Risk.She is low risk and ischemic evaluation for cardiac risk stratification is not a necessity prior to her procedure.She is acceptable cardiac risk for total knee arthroplasty. S/pMV Repair:will get FUTTE. 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  She is maintained on sulfasalazine.  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  Last seen 03/13/2021 with repeat imaging and was recommended to continue surveillance at that time.  Preop labs reviewed, unremarkable.  EKG 08/26/2022: NSR.  Rate 79.  TTE 07/05/2016: - Left ventricle: The cavity size was normal. Systolic function was  normal. The estimated ejection fraction was in the range of 55%  to 60%. Wall motion was normal; there were no regional wall  motion abnormalities. The study is not technically sufficient to  allow evaluation of LV diastolic function.  - Mitral valve: Post MV repair with trivial residual MR.  - Left atrium: The atrium was mildly dilated.  - Atrial septum: No defect or patent foramen ovale  Right/left heart cath 01/16/2016: IMPRESSIONS: Moderate (possibly severe, eccentric) mitral insufficiency. Normal coronary arteries. Normal hemodynamics.  RECOMMENDATION: Monitor closely, will eventually require referral for MV repair.  )       Anesthesia Quick Evaluation

## 2022-08-30 ENCOUNTER — Other Ambulatory Visit: Payer: Self-pay | Admitting: Surgical

## 2022-08-30 LAB — URINE CULTURE: Culture: >=100000 — AB

## 2022-08-30 MED ORDER — NITROFURANTOIN MONOHYD MACRO 100 MG PO CAPS: 100.0000 mg | ORAL_CAPSULE | Freq: Two times a day (BID) | ORAL | 0 refills | Status: DC

## 2022-08-30 NOTE — Progress Notes (Signed)
Sent in RX for abx to take prior to surgery

## 2022-08-30 NOTE — Progress Notes (Signed)
thx

## 2022-09-03 ENCOUNTER — Inpatient Hospital Stay (HOSPITAL_COMMUNITY)
Admission: RE | Admit: 2022-09-03 | Discharge: 2022-09-05 | DRG: 470 | Disposition: A | Discharge status: Home Health | Payer: Medicare Other | Attending: Orthopedic Surgery | Admitting: Orthopedic Surgery

## 2022-09-03 ENCOUNTER — Other Ambulatory Visit: Payer: Self-pay

## 2022-09-03 ENCOUNTER — Encounter (HOSPITAL_COMMUNITY)
Admission: RE | Disposition: A | Discharge status: Home Health | Payer: Self-pay | Source: Home / Self Care | Attending: Orthopedic Surgery

## 2022-09-03 ENCOUNTER — Ambulatory Visit (HOSPITAL_BASED_OUTPATIENT_CLINIC_OR_DEPARTMENT_OTHER): Payer: Medicare Other | Admitting: Certified Registered Nurse Anesthetist

## 2022-09-03 ENCOUNTER — Ambulatory Visit (HOSPITAL_COMMUNITY): Payer: Medicare Other | Admitting: Physician Assistant

## 2022-09-03 ENCOUNTER — Encounter (HOSPITAL_COMMUNITY): Payer: Self-pay | Admitting: Orthopedic Surgery

## 2022-09-03 DIAGNOSIS — K219 Gastro-esophageal reflux disease without esophagitis: Secondary | ICD-10-CM | POA: Diagnosis present

## 2022-09-03 DIAGNOSIS — M1711 Unilateral primary osteoarthritis, right knee: Principal | ICD-10-CM

## 2022-09-03 DIAGNOSIS — F419 Anxiety disorder, unspecified: Secondary | ICD-10-CM | POA: Diagnosis present

## 2022-09-03 DIAGNOSIS — Z8249 Family history of ischemic heart disease and other diseases of the circulatory system: Secondary | ICD-10-CM

## 2022-09-03 DIAGNOSIS — Z96651 Presence of right artificial knee joint: Secondary | ICD-10-CM

## 2022-09-03 DIAGNOSIS — M25761 Osteophyte, right knee: Secondary | ICD-10-CM | POA: Diagnosis present

## 2022-09-03 DIAGNOSIS — F418 Other specified anxiety disorders: Secondary | ICD-10-CM

## 2022-09-03 DIAGNOSIS — M1712 Unilateral primary osteoarthritis, left knee: Secondary | ICD-10-CM

## 2022-09-03 DIAGNOSIS — Z888 Allergy status to other drugs, medicaments and biological substances status: Secondary | ICD-10-CM

## 2022-09-03 DIAGNOSIS — Z01818 Encounter for other preprocedural examination: Principal | ICD-10-CM

## 2022-09-03 DIAGNOSIS — R110 Nausea: Secondary | ICD-10-CM | POA: Diagnosis not present

## 2022-09-03 DIAGNOSIS — Z8711 Personal history of peptic ulcer disease: Secondary | ICD-10-CM

## 2022-09-03 DIAGNOSIS — M1710 Unilateral primary osteoarthritis, unspecified knee: Secondary | ICD-10-CM

## 2022-09-03 DIAGNOSIS — Z881 Allergy status to other antibiotic agents status: Secondary | ICD-10-CM

## 2022-09-03 DIAGNOSIS — Z808 Family history of malignant neoplasm of other organs or systems: Secondary | ICD-10-CM

## 2022-09-03 HISTORY — PX: TOTAL KNEE ARTHROPLASTY: SHX125

## 2022-09-03 SURGERY — ARTHROPLASTY, KNEE, TOTAL
Anesthesia: Regional | Site: Knee | Laterality: Right

## 2022-09-03 MED ORDER — MORPHINE SULFATE 4 MG/ML IJ SOLN
INTRAMUSCULAR | Status: DC | PRN
  Administered 2022-09-03: 13 mL

## 2022-09-03 MED ORDER — 0.9 % SODIUM CHLORIDE (POUR BTL) OPTIME
TOPICAL | Status: DC | PRN
  Administered 2022-09-03 (×4): 1000 mL

## 2022-09-03 MED ORDER — SODIUM CHLORIDE 0.9 % IV SOLN: INTRAVENOUS | Status: AC

## 2022-09-03 MED ORDER — VITAMIN D 25 MCG (1000 UNIT) PO TABS
1000.0000 [IU] | ORAL_TABLET | Freq: Every day | ORAL | Status: DC
  Administered 2022-09-04 – 2022-09-05 (×2): 1000 [IU] via ORAL
  Filled 2022-09-03 (×2): qty 1

## 2022-09-03 MED ORDER — METHOCARBAMOL 1000 MG/10ML IJ SOLN: 500.0000 mg | Freq: Four times a day (QID) | INTRAVENOUS | Status: DC | PRN

## 2022-09-03 MED ORDER — GENTAMICIN SULFATE 40 MG/ML IJ SOLN
INTRAMUSCULAR | Status: DC | PRN
  Administered 2022-09-03: 240 mg

## 2022-09-03 MED ORDER — POVIDONE-IODINE 10 % EX SWAB: 2.0000 | Freq: Once | CUTANEOUS | Status: DC

## 2022-09-03 MED ORDER — ONDANSETRON HCL 4 MG/2ML IJ SOLN
INTRAMUSCULAR | Status: DC | PRN
  Administered 2022-09-03: 4 mg via INTRAVENOUS

## 2022-09-03 MED ORDER — CELECOXIB 100 MG PO CAPS
100.0000 mg | ORAL_CAPSULE | Freq: Two times a day (BID) | ORAL | Status: DC
  Administered 2022-09-03 – 2022-09-05 (×4): 100 mg via ORAL
  Filled 2022-09-03 (×5): qty 1

## 2022-09-03 MED ORDER — METOCLOPRAMIDE HCL 5 MG PO TABS
5.0000 mg | ORAL_TABLET | Freq: Three times a day (TID) | ORAL | Status: DC | PRN
  Administered 2022-09-03: 5 mg via ORAL
  Filled 2022-09-03: qty 1

## 2022-09-03 MED ORDER — DEXAMETHASONE SODIUM PHOSPHATE 10 MG/ML IJ SOLN
INTRAMUSCULAR | Status: DC | PRN
  Administered 2022-09-03: 10 mg via INTRAVENOUS

## 2022-09-03 MED ORDER — TRANEXAMIC ACID 1000 MG/10ML IV SOLN
2000.0000 mg | INTRAVENOUS | Status: DC
  Filled 2022-09-03 (×2): qty 20

## 2022-09-03 MED ORDER — GENTAMICIN SULFATE 40 MG/ML IJ SOLN
INTRAMUSCULAR | Status: AC
  Filled 2022-09-03: qty 6

## 2022-09-03 MED ORDER — GABAPENTIN 300 MG PO CAPS
900.0000 mg | ORAL_CAPSULE | Freq: Every day | ORAL | Status: DC
  Administered 2022-09-03 – 2022-09-04 (×2): 900 mg via ORAL
  Filled 2022-09-03 (×2): qty 3

## 2022-09-03 MED ORDER — ROPIVACAINE HCL 7.5 MG/ML IJ SOLN
INTRAMUSCULAR | Status: DC | PRN
  Administered 2022-09-03: 20 mL via PERINEURAL

## 2022-09-03 MED ORDER — STERILE WATER FOR IRRIGATION IR SOLN
Status: DC | PRN
  Administered 2022-09-03: 1000 mL

## 2022-09-03 MED ORDER — FENTANYL CITRATE (PF) 100 MCG/2ML IJ SOLN
INTRAMUSCULAR | Status: AC
  Filled 2022-09-03: qty 2

## 2022-09-03 MED ORDER — BUPIVACAINE LIPOSOME 1.3 % IJ SUSP
INTRAMUSCULAR | Status: AC
Start: 2022-09-03 — End: ?
  Filled 2022-09-03: qty 20

## 2022-09-03 MED ORDER — METHOCARBAMOL 500 MG PO TABS
500.0000 mg | ORAL_TABLET | Freq: Four times a day (QID) | ORAL | Status: DC | PRN
  Administered 2022-09-03 – 2022-09-05 (×3): 500 mg via ORAL
  Filled 2022-09-03 (×2): qty 1

## 2022-09-03 MED ORDER — ONDANSETRON HCL 4 MG/2ML IJ SOLN: 4.0000 mg | Freq: Four times a day (QID) | INTRAMUSCULAR | Status: DC | PRN

## 2022-09-03 MED ORDER — FENTANYL CITRATE (PF) 100 MCG/2ML IJ SOLN
25.0000 ug | INTRAMUSCULAR | Status: DC | PRN
  Administered 2022-09-03 (×2): 50 ug via INTRAVENOUS

## 2022-09-03 MED ORDER — DEXAMETHASONE SODIUM PHOSPHATE 10 MG/ML IJ SOLN
INTRAMUSCULAR | Status: AC
  Filled 2022-09-03: qty 1

## 2022-09-03 MED ORDER — PROPOFOL 500 MG/50ML IV EMUL
INTRAVENOUS | Status: DC | PRN
  Administered 2022-09-03: 75 ug/kg/min via INTRAVENOUS

## 2022-09-03 MED ORDER — POVIDONE-IODINE 7.5 % EX SOLN
Freq: Once | CUTANEOUS | Status: DC
  Filled 2022-09-03: qty 118

## 2022-09-03 MED ORDER — OXYCODONE HCL 5 MG PO TABS
ORAL_TABLET | ORAL | Status: AC
  Filled 2022-09-03: qty 1

## 2022-09-03 MED ORDER — FENTANYL CITRATE (PF) 250 MCG/5ML IJ SOLN
INTRAMUSCULAR | Status: DC | PRN
  Administered 2022-09-03: 50 ug via INTRAVENOUS

## 2022-09-03 MED ORDER — MENTHOL 3 MG MT LOZG: 1.0000 | LOZENGE | OROMUCOSAL | Status: DC | PRN

## 2022-09-03 MED ORDER — NAFCILLIN SODIUM 1 G IJ SOLR
INTRAMUSCULAR | Status: DC | PRN
  Administered 2022-09-03: 1 g

## 2022-09-03 MED ORDER — CLONIDINE HCL (ANALGESIA) 100 MCG/ML EP SOLN
EPIDURAL | Status: AC
  Filled 2022-09-03: qty 10

## 2022-09-03 MED ORDER — CEFAZOLIN SODIUM-DEXTROSE 1-4 GM/50ML-% IV SOLN
1.0000 g | Freq: Four times a day (QID) | INTRAVENOUS | Status: AC
  Administered 2022-09-03 – 2022-09-04 (×3): 1 g via INTRAVENOUS
  Filled 2022-09-03 (×3): qty 50

## 2022-09-03 MED ORDER — SODIUM CHLORIDE (PF) 0.9 % IJ SOLN
INTRAMUSCULAR | Status: DC | PRN
  Administered 2022-09-03: 60 mL

## 2022-09-03 MED ORDER — OXYCODONE HCL 5 MG/5ML PO SOLN: 5.0000 mg | Freq: Once | ORAL | Status: DC | PRN

## 2022-09-03 MED ORDER — METHOCARBAMOL 500 MG PO TABS
ORAL_TABLET | ORAL | Status: AC
  Filled 2022-09-03: qty 1

## 2022-09-03 MED ORDER — PHENYLEPHRINE HCL-NACL 20-0.9 MG/250ML-% IV SOLN
INTRAVENOUS | Status: DC | PRN
  Administered 2022-09-03: 20 ug/min via INTRAVENOUS

## 2022-09-03 MED ORDER — ONDANSETRON HCL 4 MG/2ML IJ SOLN
4.0000 mg | Freq: Four times a day (QID) | INTRAMUSCULAR | Status: DC | PRN
  Administered 2022-09-03 – 2022-09-04 (×2): 4 mg via INTRAVENOUS
  Filled 2022-09-03 (×2): qty 2

## 2022-09-03 MED ORDER — CEFAZOLIN SODIUM-DEXTROSE 2-4 GM/100ML-% IV SOLN
2.0000 g | INTRAVENOUS | Status: AC
  Administered 2022-09-03: 2 g via INTRAVENOUS
  Filled 2022-09-03: qty 100

## 2022-09-03 MED ORDER — ACETAMINOPHEN 500 MG PO TABS
1000.0000 mg | ORAL_TABLET | Freq: Four times a day (QID) | ORAL | Status: AC
  Administered 2022-09-04 (×3): 1000 mg via ORAL
  Filled 2022-09-03 (×4): qty 2

## 2022-09-03 MED ORDER — ORAL CARE MOUTH RINSE: 15.0000 mL | Freq: Once | OROMUCOSAL | Status: AC

## 2022-09-03 MED ORDER — TRANEXAMIC ACID 1000 MG/10ML IV SOLN
INTRAVENOUS | Status: DC | PRN
  Administered 2022-09-03: 2000 mg via TOPICAL

## 2022-09-03 MED ORDER — ACETAMINOPHEN 325 MG PO TABS
325.0000 mg | ORAL_TABLET | Freq: Four times a day (QID) | ORAL | Status: DC | PRN
  Administered 2022-09-05: 650 mg via ORAL
  Filled 2022-09-03: qty 2

## 2022-09-03 MED ORDER — PHENOL 1.4 % MT LIQD: 1.0000 | OROMUCOSAL | Status: DC | PRN

## 2022-09-03 MED ORDER — METOCLOPRAMIDE HCL 5 MG/ML IJ SOLN
5.0000 mg | Freq: Three times a day (TID) | INTRAMUSCULAR | Status: DC | PRN
  Administered 2022-09-04: 10 mg via INTRAVENOUS
  Filled 2022-09-03: qty 2

## 2022-09-03 MED ORDER — TRANEXAMIC ACID-NACL 1000-0.7 MG/100ML-% IV SOLN
1000.0000 mg | INTRAVENOUS | Status: AC
  Administered 2022-09-03: 1000 mg via INTRAVENOUS
  Filled 2022-09-03: qty 100

## 2022-09-03 MED ORDER — DIPHENHYDRAMINE HCL 25 MG PO CAPS: 25.0000 mg | ORAL_CAPSULE | Freq: Every day | ORAL | Status: DC | PRN

## 2022-09-03 MED ORDER — BUPIVACAINE IN DEXTROSE 0.75-8.25 % IT SOLN
INTRATHECAL | Status: DC | PRN
  Administered 2022-09-03: 1.8 mL via INTRATHECAL

## 2022-09-03 MED ORDER — BUPIVACAINE HCL (PF) 0.25 % IJ SOLN
INTRAMUSCULAR | Status: AC
  Filled 2022-09-03: qty 30

## 2022-09-03 MED ORDER — HYDROMORPHONE HCL 1 MG/ML IJ SOLN
0.5000 mg | INTRAMUSCULAR | Status: DC | PRN
  Administered 2022-09-03 (×2): 0.5 mg via INTRAVENOUS
  Filled 2022-09-03: qty 0.5

## 2022-09-03 MED ORDER — CHLORHEXIDINE GLUCONATE 0.12 % MT SOLN
15.0000 mL | Freq: Once | OROMUCOSAL | Status: AC
  Administered 2022-09-03: 15 mL via OROMUCOSAL
  Filled 2022-09-03: qty 15

## 2022-09-03 MED ORDER — ASPIRIN 81 MG PO CHEW
81.0000 mg | CHEWABLE_TABLET | Freq: Two times a day (BID) | ORAL | Status: DC
Start: 2022-09-03 — End: 2022-09-05
  Administered 2022-09-03 – 2022-09-05 (×4): 81 mg via ORAL
  Filled 2022-09-03 (×4): qty 1

## 2022-09-03 MED ORDER — ONDANSETRON HCL 4 MG/2ML IJ SOLN
INTRAMUSCULAR | Status: AC
  Filled 2022-09-03: qty 2

## 2022-09-03 MED ORDER — MORPHINE SULFATE (PF) 4 MG/ML IV SOLN
INTRAVENOUS | Status: AC
  Filled 2022-09-03: qty 2

## 2022-09-03 MED ORDER — HYDROMORPHONE HCL 1 MG/ML IJ SOLN
INTRAMUSCULAR | Status: AC
  Filled 2022-09-03: qty 1

## 2022-09-03 MED ORDER — OXYCODONE HCL 5 MG PO TABS
5.0000 mg | ORAL_TABLET | ORAL | Status: DC | PRN
  Administered 2022-09-03 – 2022-09-04 (×3): 5 mg via ORAL
  Administered 2022-09-04: 10 mg via ORAL
  Administered 2022-09-04 – 2022-09-05 (×6): 5 mg via ORAL
  Filled 2022-09-03 (×6): qty 1
  Filled 2022-09-03: qty 2
  Filled 2022-09-03 (×2): qty 1

## 2022-09-03 MED ORDER — LACTATED RINGERS IV SOLN: INTRAVENOUS | Status: DC

## 2022-09-03 MED ORDER — POVIDONE-IODINE 10 % EX SWAB
2.0000 | Freq: Once | CUTANEOUS | Status: AC
  Administered 2022-09-03: 2 via TOPICAL

## 2022-09-03 MED ORDER — FENTANYL CITRATE (PF) 250 MCG/5ML IJ SOLN
INTRAMUSCULAR | Status: AC
  Filled 2022-09-03: qty 5

## 2022-09-03 MED ORDER — SODIUM CHLORIDE 0.9 % IR SOLN
Status: DC | PRN
  Administered 2022-09-03: 3000 mL

## 2022-09-03 MED ORDER — PROPOFOL 10 MG/ML IV BOLUS
INTRAVENOUS | Status: DC | PRN
  Administered 2022-09-03: 30 mg via INTRAVENOUS

## 2022-09-03 MED ORDER — OXYCODONE HCL 5 MG PO TABS: 5.0000 mg | ORAL_TABLET | Freq: Once | ORAL | Status: DC | PRN

## 2022-09-03 MED ORDER — ONDANSETRON HCL 4 MG PO TABS: 4.0000 mg | ORAL_TABLET | Freq: Four times a day (QID) | ORAL | Status: DC | PRN

## 2022-09-03 MED ORDER — DOCUSATE SODIUM 100 MG PO CAPS
100.0000 mg | ORAL_CAPSULE | Freq: Two times a day (BID) | ORAL | Status: DC
  Administered 2022-09-03 – 2022-09-05 (×4): 100 mg via ORAL
  Filled 2022-09-03 (×4): qty 1

## 2022-09-03 SURGICAL SUPPLY — 86 items
BAG COUNTER SPONGE SURGICOUNT (BAG) ×1 IMPLANT
BAG DECANTER FOR FLEXI CONT (MISCELLANEOUS) ×1 IMPLANT
BANDAGE ESMARK 6X9 LF (GAUZE/BANDAGES/DRESSINGS) ×1 IMPLANT
BLADE SAG 18X100X1.27 (BLADE) ×1 IMPLANT
BLADE SAGITTAL (BLADE) ×1
BLADE SAW THK.89X75X18XSGTL (BLADE) ×1 IMPLANT
BNDG COHESIVE 6X5 TAN STRL LF (GAUZE/BANDAGES/DRESSINGS) ×1 IMPLANT
BNDG ELASTIC 6X15 VLCR STRL LF (GAUZE/BANDAGES/DRESSINGS) ×1 IMPLANT
BNDG ESMARK 6X9 LF (GAUZE/BANDAGES/DRESSINGS) ×1
BOWL SMART MIX CTS (DISPOSABLE) IMPLANT
CEMENT BONE REFOBACIN R1X40 US (Cement) IMPLANT
CNTNR URN SCR LID CUP LEK RST (MISCELLANEOUS) ×1 IMPLANT
COMP FEM CEMT PERS NARROW 6 RT (Joint) ×1 IMPLANT
COMPONENT FEM CMT PERS NRW 6RT (Joint) IMPLANT
CONT SPEC 4OZ STRL OR WHT (MISCELLANEOUS) ×1
COOLER ICEMAN CLASSIC (MISCELLANEOUS) IMPLANT
COVER SURGICAL LIGHT HANDLE (MISCELLANEOUS) ×1 IMPLANT
CUFF TOURN SGL QUICK 34 (TOURNIQUET CUFF) ×1
CUFF TOURN SGL QUICK 42 (TOURNIQUET CUFF) IMPLANT
CUFF TRNQT CYL 34X4.125X (TOURNIQUET CUFF) ×1 IMPLANT
DRAPE INCISE IOBAN 66X45 STRL (DRAPES) IMPLANT
DRAPE ORTHO SPLIT 77X108 STRL (DRAPES) ×2
DRAPE SURG ORHT 6 SPLT 77X108 (DRAPES) ×3 IMPLANT
DRAPE U-SHAPE 47X51 STRL (DRAPES) ×1 IMPLANT
DRSG AQUACEL AG ADV 3.5X14 (GAUZE/BANDAGES/DRESSINGS) IMPLANT
DURAPREP 26ML APPLICATOR (WOUND CARE) ×2 IMPLANT
ELECT CAUTERY BLADE 6.4 (BLADE) ×1 IMPLANT
ELECT REM PT RETURN 9FT ADLT (ELECTROSURGICAL) ×1
ELECTRODE REM PT RTRN 9FT ADLT (ELECTROSURGICAL) ×1 IMPLANT
GAUZE SPONGE 4X4 12PLY STRL (GAUZE/BANDAGES/DRESSINGS) ×1 IMPLANT
GLOVE BIOGEL PI IND STRL 7.0 (GLOVE) ×1 IMPLANT
GLOVE BIOGEL PI IND STRL 8 (GLOVE) ×1 IMPLANT
GLOVE ECLIPSE 7.0 STRL STRAW (GLOVE) ×1 IMPLANT
GLOVE ECLIPSE 8.0 STRL XLNG CF (GLOVE) ×1 IMPLANT
GLOVE SURG ENC MOIS LTX SZ6.5 (GLOVE) ×3 IMPLANT
GOWN STRL REUS W/ TWL LRG LVL3 (GOWN DISPOSABLE) ×3 IMPLANT
GOWN STRL REUS W/TWL LRG LVL3 (GOWN DISPOSABLE) ×3
HANDPIECE INTERPULSE COAX TIP (DISPOSABLE) ×1
HDLS TROCR DRIL PIN KNEE 75 (PIN) ×4
HOOD PEEL AWAY FLYTE STAYCOOL (MISCELLANEOUS) ×3 IMPLANT
IMMOBILIZER KNEE 20 (SOFTGOODS)
IMMOBILIZER KNEE 20 THIGH 36 (SOFTGOODS) IMPLANT
IMMOBILIZER KNEE 22 UNIV (SOFTGOODS) IMPLANT
IMMOBILIZER KNEE 24 THIGH 36 (MISCELLANEOUS) IMPLANT
IMMOBILIZER KNEE 24 UNIV (MISCELLANEOUS)
KIT BASIN OR (CUSTOM PROCEDURE TRAY) ×1 IMPLANT
KIT STIMULAN RAPID CURE  10CC (Orthopedic Implant) ×1 IMPLANT
KIT STIMULAN RAPID CURE 10CC (Orthopedic Implant) IMPLANT
KIT TURNOVER KIT B (KITS) ×1 IMPLANT
LINER TIB ASF PS CD/6-7 14 RT (Liner) IMPLANT
MANIFOLD NEPTUNE II (INSTRUMENTS) ×1 IMPLANT
NDL SPNL 18GX3.5 QUINCKE PK (NEEDLE) ×1 IMPLANT
NEEDLE 22X1 1/2 (OR ONLY) (NEEDLE) ×2 IMPLANT
NEEDLE SPNL 18GX3.5 QUINCKE PK (NEEDLE) ×1 IMPLANT
NS IRRIG 1000ML POUR BTL (IV SOLUTION) ×2 IMPLANT
PACK TOTAL JOINT (CUSTOM PROCEDURE TRAY) ×1 IMPLANT
PAD ARMBOARD 7.5X6 YLW CONV (MISCELLANEOUS) ×2 IMPLANT
PAD CAST 4YDX4 CTTN HI CHSV (CAST SUPPLIES) ×1 IMPLANT
PAD COLD SHLDR WRAP-ON (PAD) IMPLANT
PADDING CAST COTTON 4X4 STRL (CAST SUPPLIES)
PADDING CAST COTTON 6X4 STRL (CAST SUPPLIES) ×1 IMPLANT
PIN DRILL HDLS TROCAR 75 4PK (PIN) IMPLANT
SCREW FEMALE HEX FIX 25X2.5 (ORTHOPEDIC DISPOSABLE SUPPLIES) IMPLANT
SET HNDPC FAN SPRY TIP SCT (DISPOSABLE) ×1 IMPLANT
SPIKE FLUID TRANSFER (MISCELLANEOUS) ×1 IMPLANT
STEM POLY PAT PLY 32M KNEE (Knees) IMPLANT
STEM TIB ST PERS 14+30 (Stem) IMPLANT
STEM TIBIA 5 DEG SZ D R KNEE (Knees) IMPLANT
STRIP CLOSURE SKIN 1/2X4 (GAUZE/BANDAGES/DRESSINGS) ×2 IMPLANT
SUCTION FRAZIER HANDLE 10FR (MISCELLANEOUS) ×1
SUCTION TUBE FRAZIER 10FR DISP (MISCELLANEOUS) ×1 IMPLANT
SUT MNCRL AB 3-0 PS2 18 (SUTURE) ×1 IMPLANT
SUT VIC AB 0 CT1 27 (SUTURE) ×10
SUT VIC AB 0 CT1 27XBRD ANBCTR (SUTURE) ×3 IMPLANT
SUT VIC AB 1 CT1 36 (SUTURE) ×5 IMPLANT
SUT VIC AB 2-0 CT1 27 (SUTURE) ×4
SUT VIC AB 2-0 CT1 TAPERPNT 27 (SUTURE) ×4 IMPLANT
SYR 30ML LL (SYRINGE) ×3 IMPLANT
SYR TB 1ML LUER SLIP (SYRINGE) ×1 IMPLANT
TIBIA STEM 5 DEG SZ D R KNEE (Knees) ×1 IMPLANT
TOWEL GREEN STERILE (TOWEL DISPOSABLE) ×2 IMPLANT
TOWEL GREEN STERILE FF (TOWEL DISPOSABLE) ×2 IMPLANT
TRAY CATH 16FR W/PLASTIC CATH (SET/KITS/TRAYS/PACK) IMPLANT
TRAY CATH INTERMITTENT SS 16FR (CATHETERS) IMPLANT
WATER STERILE IRR 1000ML POUR (IV SOLUTION) IMPLANT
YANKAUER SUCT BULB TIP NO VENT (SUCTIONS) ×1 IMPLANT

## 2022-09-03 NOTE — Anesthesia Procedure Notes (Signed)
Anesthesia Regional Block: Adductor canal block   Pre-Anesthetic Checklist: , timeout performed,  Correct Patient, Correct Site, Correct Laterality,  Correct Procedure, Correct Position, site marked,  Risks and benefits discussed,  Surgical consent,  Pre-op evaluation,  At surgeon's request and post-op pain management  Laterality: Right  Prep: chloraprep       Needles:  Injection technique: Single-shot  Needle Type: Echogenic Needle     Needle Length: 9cm  Needle Gauge: 21     Additional Needles:   Narrative:  Start time: 09/03/2022 7:10 AM End time: 09/03/2022 7:19 AM Injection made incrementally with aspirations every 5 mL.  Performed by: Personally  Anesthesiologist: Achille Rich, MD  Additional Notes: Pt tolerated the procedure well.

## 2022-09-03 NOTE — Anesthesia Procedure Notes (Signed)
Spinal  Patient location during procedure: OR Start time: 09/03/2022 7:50 AM End time: 09/03/2022 7:52 AM Reason for block: surgical anesthesia Staffing Performed: anesthesiologist  Anesthesiologist: Achille Rich, MD Performed by: Achille Rich, MD Authorized by: Achille Rich, MD   Preanesthetic Checklist Completed: patient identified, IV checked, risks and benefits discussed, surgical consent, monitors and equipment checked, pre-op evaluation and timeout performed Spinal Block Patient position: sitting Prep: DuraPrep Patient monitoring: cardiac monitor, continuous pulse ox and blood pressure Approach: midline Location: L3-4 Injection technique: single-shot Needle Needle type: Pencan  Needle gauge: 24 G Needle length: 9 cm Assessment Sensory level: T10 Events: CSF return Additional Notes Functioning IV was confirmed and monitors were applied. Sterile prep and drape, including hand hygiene and sterile gloves were used. The patient was positioned and the spine was prepped. The skin was anesthetized with lidocaine.  Free flow of clear CSF was obtained prior to injecting local anesthetic into the CSF.  The spinal needle aspirated freely following injection.  The needle was carefully withdrawn.  The patient tolerated the procedure well.

## 2022-09-03 NOTE — Brief Op Note (Signed)
   09/03/2022  11:19 AM  PATIENT:  Becky Murphy  71 y.o. female  PRE-OPERATIVE DIAGNOSIS:  right knee osteoarthritis  POST-OPERATIVE DIAGNOSIS:  right knee osteoarthritis  PROCEDURE:  Procedure(s): RIGHT TOTAL KNEE ARTHROPLASTY-CEMENTED  SURGEON:  Surgeon(s): Cammy Copa, MD  ASSISTANT: magnant pa  ANESTHESIA:   spinal  EBL: 25 ml    Total I/O In: 1300 [I.V.:1100; IV Piggyback:200] Out: 810 [Urine:800; Blood:10]  BLOOD ADMINISTERED: none  DRAINS: none   LOCAL MEDICATIONS USED: Marcaine morphine clonidine Exparel stimulant beads with nafcillin and gentamicin   SPECIMEN:  No Specimen  COUNTS:  YES  TOURNIQUET:   Total Tourniquet Time Documented: Thigh (Right) - 99 minutes Total: Thigh (Right) - 99 minutes   DICTATION: [id number]  PLAN OF CARE: Admit for overnight observation  PATIENT DISPOSITION:  PACU - hemodynamically stable

## 2022-09-03 NOTE — H&P (Signed)
TOTAL KNEE ADMISSION H&P  Patient is being admitted for right total knee arthroplasty.  Subjective:  Chief Complaint:right knee pain.  HPI: Becky Murphy, 71 y.o. female, has a history of pain and functional disability in the right knee due to arthritis and has failed non-surgical conservative treatments for greater than 12 weeks to includeNSAID's and/or analgesics.  Onset of symptoms was gradual, starting 5 years ago with rapidlly worsening course since that time. The patient noted prior procedures on the knee to include  arthroscopy on the right knee(s).  Patient currently rates pain in the right knee(s) at 9 out of 10 with activity. Patient has night pain, worsening of pain with activity and weight bearing, pain that interferes with activities of daily living, pain with passive range of motion, crepitus, and joint swelling.  Patient has evidence of subchondral sclerosis and joint space narrowing by imaging studies. This patient has had  previous washout of knee with no organisms identified.  .  Patient has had previous arthroscopic washout of the knee because of presumed infection.  Extensive infectious disease work-up yielded no organisms.  Patient currently has normal sed rate C-reactive protein and no effusion in the knee.  There is no active infection.  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   High risk medication use 04/17/2022   Vitamin B 12 deficiency 04/03/2022   Iron deficiency anemia 03/29/2022   Bilateral wrist pain 02/27/2022   Hemarthrosis 02/08/2022   Synovitis of left knee 07/19/2021   Vancomycin adverse reaction 04/19/2021   Therapeutic drug monitoring 04/19/2021   Synovitis of right knee 04/04/2020   Long term (current) use of anticoagulants [Z79.01] 03/14/2016   S/P minimally invasive mitral valve repair 03/06/2016   Pancreatic mass 01/29/2016   Thyroid nodule 01/29/2016   Exertional dyspnea 01/05/2016    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   Mitral insufficiency    Mitral valve prolapse 12/26/2015   Anxiety 11/02/2015   Past Medical History:  Diagnosis Date   Anemia    low iron in the past   Anxiety    pt denies   Arthritis    "back?" (11/03/2015) knees/hands (08/28/22)   Chest pain 11/02/2015   Depression    pt denies   Exertional dyspnea 01/05/2016   GERD (gastroesophageal reflux disease)    Heart murmur    History of peptic ulcer "late 1970's"   Migraine    "maybe 3-4/yr now" (11/03/2015)   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   Complex valvuloplasty including triangular resection of posterior leaflet, artificial Gore-tex neochord placement x6 and 34 mm Memo 3D ring annuloplasty via right mini thoracotomy approach with clipping of LA appendage   Thyroid nodule 01/29/2016   2.4 cm enhancing mixed cystic and solid nodule inferior left thyroid gland noted on CT angiogram    Past Surgical History:  Procedure Laterality Date   BACK SURGERY     BREAST BIOPSY Left ~ 2005   BREAST LUMPECTOMY Left ~ 2005   CARDIAC CATHETERIZATION N/A 01/16/2016   Procedure: Right/Left Heart Cath and Coronary Angiography;  Surgeon: Thurmon Fair, MD;  Location: [facility] INVASIVE CV LAB;  Service: Cardiovascular;  Laterality: N/A;   CLIPPING OF ATRIAL APPENDAGE Left 03/06/2016   Procedure: CLIPPING OF LEFT ATRIAL APPENDAGE using a 45 PRO2 AtriClip;  Surgeon: Purcell Nails, MD;  Location: [facility] OR;  Service: Open Heart Surgery;  Laterality: Left;   COLONOSCOPY     EUS N/A 02/20/2016   Procedure: ESOPHAGEAL  ENDOSCOPIC ULTRASOUND (EUS) RADIAL;  Surgeon: Willis Modena, MD;  Location: [facility] ENDOSCOPY;  Service: Endoscopy;  Laterality: N/A;   EYE MUSCLE SURGERY Bilateral 1970's?   KNEE ARTHROSCOPY Right 04/04/2020   Procedure: RIGHT KNEE ARTHROSCOPY WITH DEBRIDEMENT;  Surgeon: Cammy Copa, MD;  Location: [facility] OR;  Service: Orthopedics;  Laterality: Right;   KNEE ARTHROSCOPY Right 04/10/2021   Procedure: right knee arthroscopy, debridement, placement of antibiotic beads;  Surgeon: Cammy Copa, MD;  Location: [facility] OR;  Service: Orthopedics;  Laterality: Right;   KNEE ARTHROSCOPY Left 12/10/2021   Procedure: LEFT KNEE ARTHROSCOPY, DEBRIDEMENT, PLACEMENT OF STIMULAN BEADS;  Surgeon: Cammy Copa, MD;  Location: [facility] OR;  Service: Orthopedics;  Laterality: Left;   LUMBAR DISC SURGERY  1992   "trimmed bulges off both sides"   MITRAL VALVE REPAIR Right 03/06/2016   Procedure: MINIMALLY INVASIVE MITRAL VALVE REPAIR (MVR) using a 34 Sorin Memo 3D Ring;  Surgeon: Purcell Nails, MD;  Location: [facility] OR;  Service: Open Heart Surgery;  Laterality: Right;   TEE WITHOUT CARDIOVERSION N/A 01/05/2016   Procedure: TRANSESOPHAGEAL ECHOCARDIOGRAM (TEE);  Surgeon: Thurmon Fair, MD;  Location: [facility] ENDOSCOPY;  Service: Cardiovascular;  Laterality: N/A;   TEE WITHOUT CARDIOVERSION N/A 03/06/2016   Procedure: TRANSESOPHAGEAL ECHOCARDIOGRAM (TEE);  Surgeon: Purcell Nails, MD;  Location: [facility] OR;  Service: Open Heart Surgery;  Laterality: N/A;   TUBAL LIGATION      Current Facility-Administered Medications  Medication Dose Route Frequency Provider Last Rate Last Admin   ceFAZolin (ANCEF) IVPB 2g/100 mL premix  2 g Intravenous On Call to OR Magnant, Charles L, PA-C       lactated ringers infusion   Intravenous Continuous Hodierne, Adam, MD       povidone-iodine (BETADINE) 7.5 % scrub   Topical Once Magnant, Charles L, PA-C       povidone-iodine 10 % swab 2 Application  2 Application Topical Once Magnant, Charles L, PA-C        tranexamic acid (CYKLOKAPRON) 2,000 mg in sodium chloride 0.9 % 50 mL Topical Application  2,000 mg Topical To OR Magnant, Charles L, PA-C       tranexamic acid (CYKLOKAPRON) IVPB 1,000 mg  1,000 mg Intravenous To OR Magnant, Charles L, PA-C       Allergies  Allergen Reactions   Vancomycin Rash   Cyanocobalamin [Vitamin B12] Rash    Injection*    Social History   Tobacco Use   Smoking status: Never    Passive exposure: Current   Smokeless tobacco: Never  Substance Use Topics   Alcohol use: Yes    Alcohol/week: 10.0 standard drinks of alcohol    Types: 6 Glasses of wine, 4 Shots of liquor per week    Comment: wine every other day    Family History  Problem Relation Age of Onset   Hypertension Mother    Cancer Mother 54       MELANOMA   Cancer Father        PROSTATE   Cancer Sister 2       BREAST  Review of Systems  Musculoskeletal:  Positive for arthralgias.  All other systems reviewed and are negative.   Objective:  Physical Exam Vitals reviewed.  HENT:     Head: Normocephalic.     Nose: Nose normal.  Cardiovascular:     Rate and Rhythm: Normal rate.     Pulses: Normal pulses.  Pulmonary:     Effort: Pulmonary effort is normal.  Abdominal:     General: Abdomen is flat.  Musculoskeletal:     Cervical back: Normal range of motion.  Skin:    General: Skin is warm.     Capillary Refill: Capillary refill takes less than 2 seconds.  Neurological:     General: No focal deficit present.     Mental Status: She is alert.  Psychiatric:        Mood and Affect: Mood normal.   Right knee exam demonstrates no effusion.  Extensor mechanism intact.  Patient has significant flexion contracture of approximately 25 degrees.  Bends to 95 degrees.  Pedal pulses palpable.  No groin pain with internal/external rotation of the right leg.  Vital signs in last 24 hours: Temp:  [98.2 F (36.8 C)] 98.2 F (36.8 C) (09/05 0559) Pulse Rate:  [84] 84 (09/05 0559) Resp:   [18] 18 (09/05 0559) BP: (141)/(84) 141/84 (09/05 0559) SpO2:  [97 %] 97 % (09/05 0559) Weight:  [64.9 kg] 64.9 kg (09/05 0559)  Labs:   Estimated body mass index is 23.08 kg/m as calculated from the following:   Height as of this encounter: 5\' 6"  (1.676 m).   Weight as of this encounter: 64.9 kg.   Imaging Review Plain radiographs demonstrate severe degenerative joint disease of the right knee(s). The overall alignment isneutral. The bone quality appears to be fair for age and reported activity level.      Assessment/Plan:  End stage arthritis, right knee   The patient history, physical examination, clinical judgment of the provider and imaging studies are consistent with end stage degenerative joint disease of the right knee(s) and total knee arthroplasty is deemed medically necessary. The treatment options including medical management, injection therapy arthroscopy and arthroplasty were discussed at length. The risks and benefits of total knee arthroplasty were presented and reviewed. The risks due to aseptic loosening, infection, stiffness, patella tracking problems, thromboembolic complications and other imponderables were discussed. The patient acknowledged the explanation, agreed to proceed with the plan and consent was signed. Patient is being admitted for inpatient treatment for surgery, pain control, PT, OT, prophylactic antibiotics, VTE prophylaxis, progressive ambulation and ADL's and discharge planning. The patient is planning to be discharged home with home health services The patient has had arthroscopic washout of the right knee in the past.  No organisms have grown.  Appears to be a rheumatologic process per rheumatology and infectious disease.  Sed rate C-reactive protein white count normal at this time.  Aspiration recently has yielded no organisms and no effusion in the knee.  Plan based on her history however would be cemented knee replacement with antibiotics in the  cement along with stimulant beads within the knee joint.    Patient's anticipated LOS is less than 2 midnights, meeting these requirements: - Younger than 52 - Lives within 1 hour of care - Has a competent adult at home to recover with post-op recover - NO history of  - Chronic pain requiring opiods  - Diabetes  - Coronary Artery Disease  - Heart failure  - Heart attack  - Stroke  -  DVT/VTE  - Cardiac arrhythmia  - Respiratory Failure/COPD  - Renal failure  - Anemia  - Advanced Liver disease

## 2022-09-03 NOTE — Progress Notes (Signed)
PT Cancellation Note  Patient Details Name: Becky Murphy MRN: [id number] DOB: May 06, 1951   Cancelled Treatment:    Reason Eval/Treat Not Completed: Medical issues which prohibited therapy. Pt unable to mobilize due to nausea.    Angelina Ok [facility] 09/03/2022, 4:57 PM Skip Mayer PT Acute [employer] [phone]

## 2022-09-03 NOTE — Transfer of Care (Signed)
Immediate Anesthesia Transfer of Care Note  Patient: Becky Murphy  Procedure(s) Performed: RIGHT TOTAL KNEE ARTHROPLASTY-CEMENTED (Right: Knee)  Patient Location: PACU  Anesthesia Type:Regional and Spinal  Level of Consciousness: awake, alert , and oriented  Airway & Oxygen Therapy: Patient Spontanous Breathing  Post-op Assessment: Report given to RN, Post -op Vital signs reviewed and stable, Patient moving all extremities, and Patient able to stick tongue midline  Post vital signs: Reviewed  Last Vitals:  Vitals Value Taken Time  BP 109/84 09/03/22 1110  Temp 97.4   Pulse 74 09/03/22 1110  Resp 13 09/03/22 1110  SpO2 91 % 09/03/22 1110    Last Pain:  Vitals:   09/03/22 0559  TempSrc: Oral         Complications: No notable events documented.

## 2022-09-03 NOTE — Progress Notes (Signed)
Orthopedic Tech Progress Note Patient Details:  Becky Murphy 15-Nov-1951 [id number]  CPM Right Knee CPM Right Knee: On Right Knee Flexion (Degrees): 10 Right Knee Extension (Degrees): 40  Post Interventions Patient Tolerated: Well Instructions Provided: Care of device Ortho Devices Type of Ortho Device: Bone foam zero knee Ortho Device/Splint Location: RLE Ortho Device/Splint Interventions: Ordered   Post Interventions Patient Tolerated: Well Instructions Provided: Care of device  Donald Pore 09/03/2022, 12:38 PM

## 2022-09-03 NOTE — Plan of Care (Signed)
Patient arrived to floor and was able to transfer from bed to [facility] with moderate assist. Patient had episode of N/V. Patient receiving zofran IV, started on fluids due to not taking POs well.   Problem: Education: Goal: Knowledge of the prescribed therapeutic regimen will improve Outcome: Progressing   Problem: Activity: Goal: Ability to avoid complications of mobility impairment will improve Outcome: Progressing   Problem: Pain Management: Goal: Pain level will decrease with appropriate interventions Outcome: Progressing   Problem: Skin Integrity: Goal: Will show signs of wound healing Outcome: Progressing

## 2022-09-03 NOTE — Op Note (Signed)
NAMEILEANE, SANDO MEDICAL RECORD NO: [id number] ACCOUNT NO: [id number] DATE OF BIRTH: 1951/11/21 FACILITY: [facility] LOCATION: [facility] PHYSICIAN: Graylin Shiver. August Saucer, MD  Operative Report   DATE OF PROCEDURE: 09/03/2022  PREOPERATIVE DIAGNOSIS:  Right knee arthritis.  POSTOPERATIVE DIAGNOSIS:  Right knee arthritis.  PROCEDURE:  Right total knee replacement using Biomet cemented components Persona cruciate-retaining size 6 narrow femur, size D 5-degree right tibia cemented with +30 stem along with 32 mm cemented patella, 8.5 mm thickness with 14 mm medial congruent  polyethylene.  SURGEON:  Graylin Shiver. August Saucer, MD  ASSISTANT:  Karenann Cai, PA.  INDICATIONS:  This is a 71 year old patient with end-stage right knee arthritis, who presents for operative management after explanation of risks and benefits.  She has had prior arthroscopic washout of the knee with no organism identified.  Currently,  no fevers, chills with normal lab count and no infectious organism on aspiration of the knee.  She presents now for operative management of the knee arthritis, which is debilitating and preventing her from ambulating.  DESCRIPTION OF PROCEDURE:  The patient was brought to the operating room where spinal anesthetic was induced.  Preoperative antibiotics administered.  Timeout was called.  Right leg was pre-scrubbed with alcohol and Betadine, allowed to air dry, prepped  with DuraPrep solution and draped in sterile manner.  Collier Flowers was used to cover the operative field.  The patient had about 25-degree flexion contracture preoperatively.  Leg was elevated and exsanguinated using an Esmarch wrap.  Tourniquet was inflated.   After calling the timeout.  Anterior approach to the knee was made.  IrriSept solution utilized.  Median parapatellar approach was made and marked with #1 Vicryl suture.  Fat pad partially excised.  Minimal medial soft tissue dissection was performed.   Lateral patellofemoral ligament was  released.  Both suprapatellar gutters had to be recreated.  Soft tissue removed from the anterior distal femur.  All in all, the knee looked very arthritic, but there was no effusion and only rheumatoid arthritis type  erosive changes around the joint.  Bone quality was good, but not good enough for any type of press-fit prosthesis.  The patella was everted, knee was flexed.  Osteophytes were removed.  ACL removed.  Anterior horn lateral meniscus released.  The patient  had a fairly symmetric tibial and femoral wear.  The posterior retractor was placed and intramedullary alignment was then used to make a cut perpendicular to the mechanical axis on the tibia.  Initially a 10 mm cut off the lateral side was made.  This  was later revised 2 more millimeters.  Then, using intramedullary alignment set at 4 degrees of valgus, the femur was cut.  Initially 12 mm in order to achieve a resection of that lateral femoral condyle.  This was later revised 4 more millimeters in  order to achieve full extension with a 10 mm spacer.  Once these cuts were made some soft tissue releases were performed around the lateral side.  Next, the femur was sized to a size 6 and the anterior, posterior and chamfer cuts were made.  Next, the  soft tissue was released from the posterior femoral condyle using Cobb elevator.  Then, the tibia was then placed, which was a size D, femoral trial was placed and then reduction was performed with both the 10, 12 and 14 mm spacer.  With the 14 mm spacer  the varus valgus stability was good and patellar tracking was also good.  Smaller inserts gave not  enough stability in flexion.  The PCL was preserved.  Next, the keel punch was performed on the tibia.  Patella was then cut down from 20 to 11 mm.  A  patellar button was placed and with trial components in position the patient achieved full extension with the 14 spacer along with full flexion, no liftoff and very good stability to varus and valgus  stress at 0, 30 and 90 degrees, trial components were  removed.  Thorough irrigation was performed with 3 liters of pulsatile irrigating solution followed by a solution of Marcaine, Exparel and saline injected into the capsule.  This was followed by TXA allowed to sit for 3 minutes along with IrriSept  solution within the incision.  These were removed and then bone plug spacers were placed after placing Stimulan beads in the tibial canal.  Next, the components were cemented into position with the 14 spacer. Excess cement was removed.  The patellar  button was also cemented.  After cement hardening had occurred, the patient was taken through range of motion, found to have same stability.  Trial spacer removed and the true spacer placed.  Tourniquet released at that time and bleeding points  encountered controlled using electrocautery.  Pouring irrigation, then utilized at this time x3-1/2 liters.  The arthrotomy was closed over bolster using #1 Vicryl suture.  Prior to final closure, the knee joint was thoroughly irrigated with IrriSept  solution and Stimulan beads with nafcillin and gentamicin were placed into the suprapatellar pouch.  The arthrotomy was then closed with #1 Vicryl suture, followed by interrupted inverted 0 Vicryl suture, 2-0 Vicryl suture and 3-0 Monocryl with  Steri-Strips and Aquacel dressing applied.  It should be noted that prior to final skin closure, a solution of Marcaine, morphine, clonidine injected into the knee for postoperative pain relief Aquacel dressing placed along with Ace wrap, iceman and knee  immobilizer.  Luke's assistance was required at all times for retraction, opening, closing, mobilization of tissue.  His assistance was a medical necessity during the entire case.   PUS D: 09/03/2022 11:28:53 am T: 09/03/2022 12:12:00 pm  JOB: [id number]/ [id number]

## 2022-09-03 NOTE — Anesthesia Postprocedure Evaluation (Signed)
Anesthesia Post Note  Patient: Becky Murphy  Procedure(s) Performed: RIGHT TOTAL KNEE ARTHROPLASTY-CEMENTED (Right: Knee)     Patient location during evaluation: PACU Anesthesia Type: Regional Level of consciousness: awake, awake and alert and oriented Pain management: pain level controlled Vital Signs Assessment: post-procedure vital signs reviewed and stable Respiratory status: spontaneous breathing, nonlabored ventilation and respiratory function stable Cardiovascular status: blood pressure returned to baseline and stable Postop Assessment: no headache, no backache, spinal receding and no apparent nausea or vomiting Anesthetic complications: no   No notable events documented.  Last Vitals:  Vitals:   09/03/22 1440 09/03/22 1510  BP: 110/83 116/84  Pulse: 73 76  Resp: 12 13  Temp:    SpO2: 93% 94%    Last Pain:  Vitals:   09/03/22 1510  TempSrc:   PainSc: Asleep                 Collene Schlichter

## 2022-09-04 ENCOUNTER — Encounter (HOSPITAL_COMMUNITY): Payer: Self-pay | Admitting: Orthopedic Surgery

## 2022-09-04 DIAGNOSIS — K219 Gastro-esophageal reflux disease without esophagitis: Secondary | ICD-10-CM | POA: Diagnosis present

## 2022-09-04 DIAGNOSIS — F419 Anxiety disorder, unspecified: Secondary | ICD-10-CM | POA: Diagnosis present

## 2022-09-04 DIAGNOSIS — Z881 Allergy status to other antibiotic agents status: Secondary | ICD-10-CM | POA: Diagnosis not present

## 2022-09-04 DIAGNOSIS — Z888 Allergy status to other drugs, medicaments and biological substances status: Secondary | ICD-10-CM | POA: Diagnosis not present

## 2022-09-04 DIAGNOSIS — Z8711 Personal history of peptic ulcer disease: Secondary | ICD-10-CM | POA: Diagnosis not present

## 2022-09-04 DIAGNOSIS — M1711 Unilateral primary osteoarthritis, right knee: Secondary | ICD-10-CM | POA: Diagnosis present

## 2022-09-04 DIAGNOSIS — Z8249 Family history of ischemic heart disease and other diseases of the circulatory system: Secondary | ICD-10-CM | POA: Diagnosis not present

## 2022-09-04 DIAGNOSIS — R110 Nausea: Secondary | ICD-10-CM | POA: Diagnosis not present

## 2022-09-04 DIAGNOSIS — M25761 Osteophyte, right knee: Secondary | ICD-10-CM | POA: Diagnosis present

## 2022-09-04 DIAGNOSIS — Z808 Family history of malignant neoplasm of other organs or systems: Secondary | ICD-10-CM | POA: Diagnosis not present

## 2022-09-04 NOTE — Progress Notes (Signed)
Physical Therapy Treatment Patient Details Name: Becky Murphy MRN: [id number] DOB: Feb 06, 1951 Today's Date: 09/04/2022   History of Present Illness Pt is a 71 y.o. 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 Significant PMH: none.    PT Comments    Pt progressing slowly towards her physical therapy goals; reports mild improvement in nausea and strength. Session focused on gait training in order to progress to ambulating household distances. Pt ambulating 50 ft with a walker, utilizing a step to pattern; needs consistent cues for sequencing and right quad activation. Will continue to progress as tolerated.    Recommendations for follow up therapy are one component of a multi-disciplinary discharge planning process, led by the attending physician.  Recommendations may be updated based on patient status, additional functional criteria and insurance authorization.  Follow Up Recommendations  Follow physician's recommendations for discharge plan and follow up therapies     Assistance Recommended at Discharge PRN  Patient can return home with the following A little help with walking and/or transfers;A little help with bathing/dressing/bathroom;Assistance with cooking/housework;Assist for transportation;Help with stairs or ramp for entrance   Equipment Recommendations  None recommended by PT    Recommendations for Other Services       Precautions / Restrictions Precautions Precautions: Fall Restrictions Weight Bearing Restrictions: Yes RLE Weight Bearing: Weight bearing as tolerated Other Position/Activity Restrictions: Pt refusing R KI use; at least 3/5 quad strength and no knee buckle noted     Mobility  Bed Mobility Overal bed mobility: Needs Assistance Bed Mobility: Supine to Sit, Sit to Supine     Supine to sit: Supervision Sit to supine: Supervision   General bed mobility comments: supervision for safety, no physical assist required    Transfers Overall transfer level: Needs  assistance Equipment used: Rolling walker (2 wheels) Transfers: Sit to/from Stand Sit to Stand: Min guard           General transfer comment: Cues for hand placement    Ambulation/Gait Ambulation/Gait assistance: Min guard Gait Distance (Feet): 50 Feet Assistive device: Rolling walker (2 wheels) Gait Pattern/deviations: Step-to pattern, Decreased stance time - right, Decreased weight shift to right, Antalgic, Trunk flexed Gait velocity: decreased Gait velocity interpretation: <1.8 ft/sec, indicate of risk for recurrent falls   General Gait Details: cues for sequencing/technique, upright posture, walker positioning, quad activation, right heel strike. min guard for safety   Stairs             Wheelchair Mobility    Modified Rankin (Stroke Patients Only)       Balance Overall balance assessment: Mild deficits observed, not formally tested                                          Cognition Arousal/Alertness: Awake/alert Behavior During Therapy: WFL for tasks assessed/performed Overall Cognitive Status: Within Functional Limits for tasks assessed                                          Exercises Total Joint Exercises Quad Sets: Right, 10 reps, Supine Heel Slides: Right, 5 reps, Supine Hip ABduction/ADduction: Right, 5 reps, Supine Straight Leg Raises: Right, 5 reps, Supine, AAROM Long Arc Quad: Right, 5 reps, Seated Knee Flexion: Right, 5 reps, Seated Goniometric ROM: 95 degrees seated knee flexion  General Comments        Pertinent Vitals/Pain Pain Assessment Pain Assessment: Faces Pain Score: 4  Faces Pain Scale: Hurts little more Pain Location: R knee Pain Descriptors / Indicators: Operative site guarding, Grimacing Pain Intervention(s): Monitored during session    Home Living                          Prior Function            PT Goals (current goals can now be found in the care plan  section) Acute Rehab PT Goals Patient Stated Goal: less nausea PT Goal Formulation: With patient/family Time For Goal Achievement: 09/18/22 Potential to Achieve Goals: Good Progress towards PT goals: Progressing toward goals    Frequency    7X/week      PT Plan Current plan remains appropriate    Co-evaluation              AM-PAC PT "6 Clicks" Mobility   Outcome Measure  Help needed turning from your back to your side while in a flat bed without using bedrails?: None Help needed moving from lying on your back to sitting on the side of a flat bed without using bedrails?: A Little Help needed moving to and from a bed to a chair (including a wheelchair)?: A Little Help needed standing up from a chair using your arms (e.g., wheelchair or bedside chair)?: A Little Help needed to walk in hospital room?: A Little Help needed climbing 3-5 steps with a railing? : A Lot 6 Click Score: 18    End of Session Equipment Utilized During Treatment: Gait belt Activity Tolerance: Patient tolerated treatment well Patient left: with call bell/phone within reach;in chair;with family/visitor present Nurse Communication: Mobility status PT Visit Diagnosis: Other abnormalities of gait and mobility (R26.89);Difficulty in walking, not elsewhere classified (R26.2);Pain Pain - Right/Left: Right Pain - part of body: Knee     Time: 1448-1510 PT Time Calculation (min) (ACUTE ONLY): 22 min  Charges:  $Gait Training: 8-22 mins                     Lillia Pauls, PT, DPT Acute Rehabilitation Services Office [phone]    Norval Morton 09/04/2022, 3:45 PM

## 2022-09-04 NOTE — Progress Notes (Signed)
Declines using knee immobilizer- reinforced importance of wearing continues to decline.

## 2022-09-04 NOTE — Progress Notes (Signed)
  Subjective: Patient stable.  Pain controlled.  Having some nausea this morning.     Objective: Vital signs in last 24 hours: Temp:  [97.5 F (36.4 C)-97.8 F (36.6 C)] 97.6 F (36.4 C) (09/06 0730) Pulse Rate:  [63-79] 68 (09/06 0730) Resp:  [10-16] 16 (09/05 1543) BP: (109-127)/(76-95) 114/86 (09/06 0730) SpO2:  [91 %-97 %] 94 % (09/06 0730)  Intake/Output from previous day: 09/05 0701 - 09/06 0700 In: 1380.9 [I.V.:1180.9; IV Piggyback:200] Out: 810 [Urine:800; Blood:10] Intake/Output this shift: No intake/output data recorded.  Exam:  Dorsiflexion/Plantar flexion intact No cellulitis present Compartment soft  Labs: No results for input(s): "HGB" in the last 72 hours. No results for input(s): "WBC", "RBC", "HCT", "PLT" in the last 72 hours. No results for input(s): "NA", "K", "CL", "CO2", "BUN", "CREATININE", "GLUCOSE", "CALCIUM" in the last 72 hours. No results for input(s): "LABPT", "INR" in the last 72 hours.  Assessment/Plan: Mobilize today with physical therapy.  Possible discharge tomorrow.  Work on full extension with physical therapy.    Becky Murphy 09/04/2022, 7:38 AM

## 2022-09-04 NOTE — Evaluation (Signed)
Physical Therapy Evaluation Patient Details Name: Becky Murphy MRN: [id number] DOB: [birth year] Today's Date: 09/04/2022  History of Present Illness  Pt is a 71 y.o. 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 Significant PMH: none.  Clinical Impression  Pt admitted s/p R TKA. Initiated bed level exercises for RLE strengthening/ROM and out of bed mobility. Pt ambulating 25 ft with a walker at a min guard assist level. Further distance limited due to nausea/vomiting. RN aware and present. Pt achieving 95 degrees seated knee flexion. Placed in bone foam at end of session to promote extension. Will progress as tolerated.     Recommendations for follow up therapy are one component of a multi-disciplinary discharge planning process, led by the attending physician.  Recommendations may be updated based on patient status, additional functional criteria and insurance authorization.  Follow Up Recommendations Follow physician's recommendations for discharge plan and follow up therapies      Assistance Recommended at Discharge PRN  Patient can return home with the following  A little help with walking and/or transfers;A little help with bathing/dressing/bathroom;Assistance with cooking/housework;Assist for transportation;Help with stairs or ramp for entrance    Equipment Recommendations None recommended by PT  Recommendations for Other Services       Functional Status Assessment Patient has had a recent decline in their functional status and demonstrates the ability to make significant improvements in function in a reasonable and predictable amount of time.     Precautions / Restrictions Precautions Precautions: Fall Restrictions Weight Bearing Restrictions: Yes RLE Weight Bearing: Weight bearing as tolerated Other Position/Activity Restrictions: Pt refusing R KI use; at least 3/5 quad strength and no knee buckle noted      Mobility  Bed Mobility Overal bed mobility: Needs Assistance Bed  Mobility: Supine to Sit     Supine to sit: Supervision     General bed mobility comments: supervision for safety, no physical assist required    Transfers Overall transfer level: Needs assistance Equipment used: Rolling walker (2 wheels) Transfers: Sit to/from Stand Sit to Stand: Min assist           General transfer comment: Light minA to power up to stand; cues for hand placement    Ambulation/Gait Ambulation/Gait assistance: Min guard Gait Distance (Feet): 25 Feet Assistive device: Rolling walker (2 wheels) Gait Pattern/deviations: Step-to pattern, Decreased stance time - right, Decreased weight shift to right, Antalgic, Trunk flexed Gait velocity: decreased Gait velocity interpretation: <1.8 ft/sec, indicate of risk for recurrent falls   General Gait Details: cues for sequencing/technique, upright posture, walker positioning, segmental turning. min guard for safety  Stairs            Wheelchair Mobility    Modified Rankin (Stroke Patients Only)       Balance Overall balance assessment: Mild deficits observed, not formally tested                                           Pertinent Vitals/Pain Pain Assessment Pain Assessment: 0-10 Pain Score: 4  Pain Location: R knee Pain Descriptors / Indicators: Operative site guarding, Grimacing Pain Intervention(s): Limited activity within patient's tolerance, Monitored during session (RN could not provide po medication due to N/V)    Home Living Family/patient expects to be discharged to:: Private residence Living Arrangements: Spouse/significant other Available Help at Discharge: Family Type of Home: House Home Access: Stairs to enter Entrance  Stairs-Rails: Can reach both;Right;Left Entrance Stairs-Number of Steps: 1+1 short steps   Home Layout: One level Home Equipment: Agricultural consultant (2 wheels);Cane - single point      Prior Function Prior Level of Function : Independent/Modified  Independent             Mobility Comments: indep ADLs Comments: indep     Hand Dominance   Dominant Hand: Right    Extremity/Trunk Assessment   Upper Extremity Assessment Upper Extremity Assessment: Overall WFL for tasks assessed    Lower Extremity Assessment Lower Extremity Assessment: RLE deficits/detail RLE Deficits / Details: s/p TKA. Able to perform LAQ, limited SLR    Cervical / Trunk Assessment Cervical / Trunk Assessment: Normal  Communication   Communication: No difficulties  Cognition Arousal/Alertness: Awake/alert Behavior During Therapy: WFL for tasks assessed/performed Overall Cognitive Status: Within Functional Limits for tasks assessed                                          General Comments      Exercises Total Joint Exercises Quad Sets: Right, 10 reps, Supine Hip ABduction/ADduction: Right, 5 reps, Supine Straight Leg Raises: Right, 5 reps, Supine, AAROM Long Arc Quad: Right, 5 reps, Seated Knee Flexion: Right, 5 reps, Seated Goniometric ROM: 95 degrees seated knee flexion   Assessment/Plan    PT Assessment Patient needs continued PT services  PT Problem List Decreased strength;Decreased range of motion;Decreased activity tolerance;Decreased balance;Decreased mobility;Pain       PT Treatment Interventions DME instruction;Gait training;Stair training;Functional mobility training;Therapeutic activities;Therapeutic exercise;Balance training;Patient/family education    PT Goals (Current goals can be found in the Care Plan section)  Acute Rehab PT Goals Patient Stated Goal: less nausea PT Goal Formulation: With patient/family Time For Goal Achievement: 09/18/22 Potential to Achieve Goals: Good    Frequency 7X/week     Co-evaluation               AM-PAC PT "6 Clicks" Mobility  Outcome Measure Help needed turning from your back to your side while in a flat bed without using bedrails?: None Help needed moving  from lying on your back to sitting on the side of a flat bed without using bedrails?: A Little Help needed moving to and from a bed to a chair (including a wheelchair)?: A Little Help needed standing up from a chair using your arms (e.g., wheelchair or bedside chair)?: A Little Help needed to walk in hospital room?: A Little Help needed climbing 3-5 steps with a railing? : A Lot 6 Click Score: 18    End of Session Equipment Utilized During Treatment: Gait belt Activity Tolerance: Other (comment) (limited by nausea) Patient left: with call bell/phone within reach;in chair;with family/visitor present Nurse Communication: Mobility status PT Visit Diagnosis: Other abnormalities of gait and mobility (R26.89);Difficulty in walking, not elsewhere classified (R26.2);Pain Pain - Right/Left: Right Pain - part of body: Knee    Time: 0802-0836 PT Time Calculation (min) (ACUTE ONLY): 34 min   Charges:   PT Evaluation $PT Eval Low Complexity: 1 Low PT Treatments $Gait Training: 8-22 mins        Lillia Pauls, PT, DPT Acute Rehabilitation Services Office [phone]   Norval Morton 09/04/2022, 11:41 AM

## 2022-09-04 NOTE — Plan of Care (Addendum)
@  0800 Patient tolerated CPM well this am. Patient still having N/V. Zofran and Reglan have been administered. Pain is controlled with PO PRNs. Patient transferred to chair well.  @1500  patient placed in CPM and tolerated well.  Patient tolerating ambulating to bathroom well. Patient now has TED hose in place and continuing SCDs. Pain has been controlled with PO PRN meds.   Problem: Education: Goal: Knowledge of the prescribed therapeutic regimen will improve Outcome: Progressing   Problem: Activity: Goal: Ability to avoid complications of mobility impairment will improve Outcome: Progressing   Problem: Pain Management: Goal: Pain level will decrease with appropriate interventions Outcome: Progressing   Problem: Skin Integrity: Goal: Will show signs of wound healing Outcome: Progressing   Problem: Safety: Goal: Ability to remain free from injury will improve Outcome: Progressing

## 2022-09-05 MED ORDER — ASPIRIN 81 MG PO CHEW: 81.0000 mg | CHEWABLE_TABLET | Freq: Two times a day (BID) | ORAL | 0 refills | Status: DC

## 2022-09-05 MED ORDER — METHOCARBAMOL 500 MG PO TABS: 500.0000 mg | ORAL_TABLET | Freq: Three times a day (TID) | ORAL | 0 refills | Status: DC | PRN

## 2022-09-05 MED ORDER — OXYCODONE HCL 5 MG PO TABS: 5.0000 mg | ORAL_TABLET | ORAL | 0 refills | Status: DC | PRN

## 2022-09-05 MED ORDER — CELECOXIB 100 MG PO CAPS: 100.0000 mg | ORAL_CAPSULE | Freq: Two times a day (BID) | ORAL | 0 refills | Status: DC

## 2022-09-05 NOTE — Progress Notes (Signed)
  Subjective: Becky Murphy is a 71 y.o. female s/p right TKA.  They are POD 2.  Pt's pain is controlled.  Pt denies any complain of chest pain, shortness of breath, abdominal pain, calf pain.  Patient denies any fevers or chills.  She has been ambulatory with physical therapy and walking 65 feet with good progression of her ambulation distance.  She has been ambulatory to the bathroom as well without any lightheadedness or dizziness.  She feels more mobile now than she did prior to surgery.  Pain is progressively improving.  Objective: Vital signs in last 24 hours: Temp:  [97.9 F (36.6 C)-98.6 F (37 C)] 98.6 F (37 C) (09/07 0927) Pulse Rate:  [75-83] 81 (09/07 0927) Resp:  [17] 17 (09/07 0927) BP: (126-144)/(83-92) 144/92 (09/07 0927) SpO2:  [95 %-98 %] 95 % (09/07 0927)  Intake/Output from previous day: 09/06 0701 - 09/07 0700 In: 240 [P.O.:240] Out: -  Intake/Output this shift: No intake/output data recorded.  Exam:  No gross blood or drainage overlying the dressing.  Small amount of redness in the lateral aspect of the midportion of the incision that improves with elevation of the extremity. 2+ DP pulse Sensation intact distally in the operative foot Able to dorsiflex and plantarflex the operative foot No calf tenderness.  Negative Homans' sign. Able to perform straight leg raise   Labs: No results for input(s): "HGB" in the last 72 hours. No results for input(s): "WBC", "RBC", "HCT", "PLT" in the last 72 hours. No results for input(s): "NA", "K", "CL", "CO2", "BUN", "CREATININE", "GLUCOSE", "CALCIUM" in the last 72 hours. No results for input(s): "LABPT", "INR" in the last 72 hours.  Assessment/Plan: Pt is POD 2 s/p TKA.    -Plan to discharge to home today or tomorrow pending patient's pain and PT eval  -WBAT with a walker  -Follow-up with Dr. August Saucer in clinic 2 weeks postoperatively    Julieanne Cotton 09/05/2022, 11:33 AM

## 2022-09-05 NOTE — Progress Notes (Signed)
Physical Therapy Treatment Patient Details Name: Becky Murphy MRN: [id number] DOB: 12-01-1951 Today's Date: 09/05/2022   History of Present Illness Pt is a 71 y.o. 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 Significant PMH: none.    PT Comments    Patient progressing towards physical therapy goals. Patient able to increase ambulation distance but continues to demonstrate R quad weakness with inability to fully extend R knee in standing. Encouraged use of bone foam and CPM to assist in R knee extension ROM. D/c plan remains appropriate.     Recommendations for follow up therapy are one component of a multi-disciplinary discharge planning process, led by the attending physician.  Recommendations may be updated based on patient status, additional functional criteria and insurance authorization.  Follow Up Recommendations  Follow physician's recommendations for discharge plan and follow up therapies     Assistance Recommended at Discharge PRN  Patient can return home with the following A little help with walking and/or transfers;A little help with bathing/dressing/bathroom;Assistance with cooking/housework;Assist for transportation;Help with stairs or ramp for entrance   Equipment Recommendations  None recommended by PT    Recommendations for Other Services       Precautions / Restrictions Precautions Precautions: Fall Restrictions Weight Bearing Restrictions: Yes RLE Weight Bearing: Weight bearing as tolerated Other Position/Activity Restrictions: Pt refusing R KI use; at least 3/5 quad strength and no knee buckle noted     Mobility  Bed Mobility Overal bed mobility: Needs Assistance Bed Mobility: Supine to Sit, Sit to Supine     Supine to sit: Supervision Sit to supine: Supervision   General bed mobility comments: cues for using L LE to assist R LE  off bed by hooking.    Transfers Overall transfer level: Needs assistance Equipment used: Rolling Hallie Ishida (2 wheels) Transfers:  Sit to/from Stand Sit to Stand: Min guard           General transfer comment: good recall of hand placement    Ambulation/Gait Ambulation/Gait assistance: Min guard, Supervision Gait Distance (Feet): 65 Feet Assistive device: Rolling Ramirez Fullbright (2 wheels) Gait Pattern/deviations: Step-to pattern, Decreased stance time - right, Decreased weight shift to right, Antalgic, Trunk flexed Gait velocity: decreased     General Gait Details: patient keeping R knee flexed throughout ambulation due to R quad weakness but continues to decline use of KI for mobility. Cues for upright posture, R quad activation, and heel strike. Min guard progressing to supervision   Stairs             Wheelchair Mobility    Modified Rankin (Stroke Patients Only)       Balance Overall balance assessment: Mild deficits observed, not formally tested                                          Cognition Arousal/Alertness: Awake/alert Behavior During Therapy: WFL for tasks assessed/performed Overall Cognitive Status: Within Functional Limits for tasks assessed                                          Exercises Total Joint Exercises Straight Leg Raises: Right, 5 reps, Supine, AAROM Long Arc Quad: Right, 5 reps, Seated    General Comments        Pertinent Vitals/Pain Pain Assessment Pain Assessment: 0-10 Pain  Score: 7  Pain Location: R knee Pain Descriptors / Indicators: Operative site guarding, Grimacing Pain Intervention(s): Monitored during session    Home Living                          Prior Function            PT Goals (current goals can now be found in the care plan section) Acute Rehab PT Goals PT Goal Formulation: With patient/family Time For Goal Achievement: 09/18/22 Potential to Achieve Goals: Good Progress towards PT goals: Progressing toward goals    Frequency    7X/week      PT Plan Current plan remains appropriate     Co-evaluation              AM-PAC PT "6 Clicks" Mobility   Outcome Measure  Help needed turning from your back to your side while in a flat bed without using bedrails?: None Help needed moving from lying on your back to sitting on the side of a flat bed without using bedrails?: A Little Help needed moving to and from a bed to a chair (including a wheelchair)?: A Little Help needed standing up from a chair using your arms (e.g., wheelchair or bedside chair)?: A Little Help needed to walk in hospital room?: A Little Help needed climbing 3-5 steps with a railing? : A Lot 6 Click Score: 18    End of Session Equipment Utilized During Treatment: Gait belt Activity Tolerance: Patient tolerated treatment well Patient left: in bed;with call bell/phone within reach;with bed alarm set;with family/visitor present Nurse Communication: Mobility status PT Visit Diagnosis: Other abnormalities of gait and mobility (R26.89);Difficulty in walking, not elsewhere classified (R26.2);Pain Pain - Right/Left: Right Pain - part of body: Knee     Time: 5277-8242 PT Time Calculation (min) (ACUTE ONLY): 28 min  Charges:  $Gait Training: 8-22 mins $Therapeutic Exercise: 8-22 mins                     Annet Manukyan A. Dan Humphreys PT, DPT Acute Rehabilitation Services Office [phone]    Viviann Spare 09/05/2022, 10:20 AM

## 2022-09-05 NOTE — Plan of Care (Addendum)
Pt discharged with belongings and discharge packet. Not in acute distress. Problem: Education: Goal: Knowledge of the prescribed therapeutic regimen will improve 09/05/2022 1601 by Elie Confer, RN Outcome: Adequate for Discharge 09/05/2022 0745 by Elie Confer, RN Outcome: Progressing Goal: Individualized Educational Video(s) 09/05/2022 1601 by Elie Confer, RN Outcome: Adequate for Discharge 09/05/2022 0745 by Elie Confer, RN Outcome: Progressing   Problem: Activity: Goal: Ability to avoid complications of mobility impairment will improve 09/05/2022 1601 by Elie Confer, RN Outcome: Adequate for Discharge 09/05/2022 0745 by Elie Confer, RN Outcome: Progressing Goal: Range of joint motion will improve 09/05/2022 1601 by Elie Confer, RN Outcome: Adequate for Discharge 09/05/2022 0745 by Elie Confer, RN Outcome: Progressing   Problem: Clinical Measurements: Goal: Postoperative complications will be avoided or minimized 09/05/2022 1601 by Elie Confer, RN Outcome: Adequate for Discharge 09/05/2022 0745 by Elie Confer, RN Outcome: Progressing   Problem: Pain Management: Goal: Pain level will decrease with appropriate interventions 09/05/2022 1601 by Elie Confer, RN Outcome: Adequate for Discharge 09/05/2022 0745 by Elie Confer, RN Outcome: Progressing   Problem: Skin Integrity: Goal: Will show signs of wound healing 09/05/2022 1601 by Elie Confer, RN Outcome: Adequate for Discharge 09/05/2022 0745 by Elie Confer, RN Outcome: Progressing   Problem: Education: Goal: Knowledge of General Education information will improve Description: Including pain rating scale, medication(s)/side effects and non-pharmacologic comfort measures 09/05/2022 1601 by Elie Confer, RN Outcome: Adequate for Discharge 09/05/2022 0745 by Elie Confer, RN Outcome: Progressing   Problem: Health Behavior/Discharge Planning: Goal: Ability to manage  health-related needs will improve 09/05/2022 1601 by Elie Confer, RN Outcome: Adequate for Discharge 09/05/2022 0745 by Elie Confer, RN Outcome: Progressing   Problem: Clinical Measurements: Goal: Ability to maintain clinical measurements within normal limits will improve 09/05/2022 1601 by Elie Confer, RN Outcome: Adequate for Discharge 09/05/2022 0745 by Elie Confer, RN Outcome: Progressing Goal: Will remain free from infection 09/05/2022 1601 by Elie Confer, RN Outcome: Adequate for Discharge 09/05/2022 0745 by Elie Confer, RN Outcome: Progressing Goal: Diagnostic test results will improve 09/05/2022 1601 by Elie Confer, RN Outcome: Adequate for Discharge 09/05/2022 0745 by Elie Confer, RN Outcome: Progressing Goal: Respiratory complications will improve 09/05/2022 1601 by Elie Confer, RN Outcome: Adequate for Discharge 09/05/2022 0745 by Elie Confer, RN Outcome: Progressing Goal: Cardiovascular complication will be avoided 09/05/2022 1601 by Elie Confer, RN Outcome: Adequate for Discharge 09/05/2022 0745 by Elie Confer, RN Outcome: Progressing   Problem: Activity: Goal: Risk for activity intolerance will decrease 09/05/2022 1601 by Elie Confer, RN Outcome: Adequate for Discharge 09/05/2022 0745 by Elie Confer, RN Outcome: Progressing   Problem: Nutrition: Goal: Adequate nutrition will be maintained 09/05/2022 1601 by Elie Confer, RN Outcome: Adequate for Discharge 09/05/2022 0745 by Elie Confer, RN Outcome: Progressing   Problem: Coping: Goal: Level of anxiety will decrease 09/05/2022 1601 by Elie Confer, RN Outcome: Adequate for Discharge 09/05/2022 0745 by Elie Confer, RN Outcome: Progressing   Problem: Elimination: Goal: Will not experience complications related to bowel motility 09/05/2022 1601 by Elie Confer, RN Outcome: Adequate for Discharge 09/05/2022 0745 by Elie Confer, RN Outcome:  Progressing Goal: Will not experience complications related to urinary retention 09/05/2022 1601 by Elie Confer, RN Outcome: Adequate for Discharge 09/05/2022 0745 by Elie Confer, RN Outcome: Progressing   Problem: Pain  Managment: Goal: General experience of comfort will improve 09/05/2022 1601 by Elie Confer, RN Outcome: Adequate for Discharge 09/05/2022 0745 by Elie Confer, RN Outcome: Progressing   Problem: Safety: Goal: Ability to remain free from injury will improve 09/05/2022 1601 by Elie Confer, RN Outcome: Adequate for Discharge 09/05/2022 0745 by Elie Confer, RN Outcome: Progressing   Problem: Skin Integrity: Goal: Risk for impaired skin integrity will decrease 09/05/2022 1601 by Elie Confer, RN Outcome: Adequate for Discharge 09/05/2022 0745 by Elie Confer, RN Outcome: Progressing

## 2022-09-05 NOTE — Plan of Care (Signed)

## 2022-09-05 NOTE — TOC Transition Note (Addendum)
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 RN, patient, patient's husband, and [facility] notified of DC. Home health was setup by provider's office.Pt without DME needs. States already has RW,BSC and CPM @ home. Son to provide transportation to home. Pt without  Rx MED concerns . Post hospital f/u noted on AVS.  RNCM will sign off for now as intervention is no longer needed. Please consult [place] again if new needs arise.    Final next level of care: Home w Home Health Services Barriers to Discharge: No Barriers Identified   Patient Goals and CMS Choice        Discharge Placement                       Discharge Plan and Services                                     Social Determinants of Health (SDOH) Interventions     Readmission Risk Interventions     No data to display

## 2022-09-05 NOTE — Progress Notes (Signed)
Physical Therapy Treatment Patient Details Name: Becky Murphy MRN: [id number] DOB: 1951-07-14 Today's Date: 09/05/2022   History of Present Illness Pt is a 71 y.o. 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 Significant PMH: none.    PT Comments    Continues to progress steadily. Increasing ambulation distance this session with RW. She continues to decline use of KI for stability due to past use of KI and bad experience. Patient performed exercises in supine focused on R LE strengthening. D/c plan remains appropriate.    Recommendations for follow up therapy are one component of a multi-disciplinary discharge planning process, led by the attending physician.  Recommendations may be updated based on patient status, additional functional criteria and insurance authorization.  Follow Up Recommendations  Follow physician's recommendations for discharge plan and follow up therapies     Assistance Recommended at Discharge PRN  Patient can return home with the following A little help with walking and/or transfers;A little help with bathing/dressing/bathroom;Assistance with cooking/housework;Assist for transportation;Help with stairs or ramp for entrance   Equipment Recommendations  None recommended by PT    Recommendations for Other Services       Precautions / Restrictions Precautions Precautions: Fall Restrictions Weight Bearing Restrictions: Yes RLE Weight Bearing: Weight bearing as tolerated Other Position/Activity Restrictions: Pt refusing R KI use; at least 3/5 quad strength and no knee buckle noted     Mobility  Bed Mobility Overal bed mobility: Needs Assistance Bed Mobility: Supine to Sit, Sit to Supine     Supine to sit: Supervision Sit to supine: Supervision   General bed mobility comments: cues for using L LE to assist R LE  off bed by hooking.    Transfers Overall transfer level: Needs assistance Equipment used: Rolling Zani Kyllonen (2 wheels) Transfers: Sit to/from  Stand Sit to Stand: Min guard           General transfer comment: good recall of hand placement    Ambulation/Gait Ambulation/Gait assistance: Supervision Gait Distance (Feet): 75 Feet Assistive device: Rolling Henley Blyth (2 wheels) Gait Pattern/deviations: Step-to pattern, Decreased stance time - right, Decreased weight shift to right, Antalgic, Trunk flexed Gait velocity: decreased     General Gait Details: patient keeping R knee flexed throughout ambulation due to R quad weakness. Cues for upright posture, R quad activation, and heel strike. supervision for safety   Stairs             Wheelchair Mobility    Modified Rankin (Stroke Patients Only)       Balance Overall balance assessment: Mild deficits observed, not formally tested                                          Cognition Arousal/Alertness: Awake/alert Behavior During Therapy: WFL for tasks assessed/performed Overall Cognitive Status: Within Functional Limits for tasks assessed                                          Exercises Total Joint Exercises Heel Slides: Right, 5 reps, Supine Hip ABduction/ADduction: Right, 5 reps, Supine Straight Leg Raises: Right, 5 reps, Supine Long Arc Quad: Right, 5 reps, Seated    General Comments        Pertinent Vitals/Pain Pain Assessment Pain Assessment: Faces Pain Score: 7  Faces Pain Scale:  Hurts even more Pain Location: R knee Pain Descriptors / Indicators: Operative site guarding, Grimacing Pain Intervention(s): Monitored during session    Home Living                          Prior Function            PT Goals (current goals can now be found in the care plan section) Acute Rehab PT Goals PT Goal Formulation: With patient/family Time For Goal Achievement: 09/18/22 Potential to Achieve Goals: Good Progress towards PT goals: Progressing toward goals    Frequency    7X/week      PT Plan  Current plan remains appropriate    Co-evaluation              AM-PAC PT "6 Clicks" Mobility   Outcome Measure  Help needed turning from your back to your side while in a flat bed without using bedrails?: None Help needed moving from lying on your back to sitting on the side of a flat bed without using bedrails?: A Little Help needed moving to and from a bed to a chair (including a wheelchair)?: A Little Help needed standing up from a chair using your arms (e.g., wheelchair or bedside chair)?: A Little Help needed to walk in hospital room?: A Little Help needed climbing 3-5 steps with a railing? : A Lot 6 Click Score: 18    End of Session Equipment Utilized During Treatment: Gait belt Activity Tolerance: Patient tolerated treatment well Patient left: in bed;with call bell/phone within reach;with bed alarm set;with family/visitor present Nurse Communication: Mobility status PT Visit Diagnosis: Other abnormalities of gait and mobility (R26.89);Difficulty in walking, not elsewhere classified (R26.2);Pain Pain - Right/Left: Right Pain - part of body: Knee     Time: 8527-7824 PT Time Calculation (min) (ACUTE ONLY): 20 min  Charges:  $Therapeutic Activity: 8-22 mins                     Raiden Haydu A. Dan Humphreys PT, DPT Acute Rehabilitation Services Office [phone]    Viviann Spare 09/05/2022, 3:28 PM

## 2022-09-18 ENCOUNTER — Inpatient Hospital Stay: Payer: Medicare Other

## 2022-09-18 ENCOUNTER — Ambulatory Visit (INDEPENDENT_AMBULATORY_CARE_PROVIDER_SITE_OTHER): Payer: Medicare Other

## 2022-09-18 ENCOUNTER — Ambulatory Visit (INDEPENDENT_AMBULATORY_CARE_PROVIDER_SITE_OTHER): Payer: Medicare Other | Admitting: Orthopedic Surgery

## 2022-09-18 DIAGNOSIS — Z96651 Presence of right artificial knee joint: Secondary | ICD-10-CM

## 2022-09-18 MED ORDER — OXYCODONE HCL 5 MG PO TABS: 5.0000 mg | ORAL_TABLET | Freq: Four times a day (QID) | ORAL | 0 refills | Status: DC | PRN

## 2022-09-19 ENCOUNTER — Encounter: Payer: Self-pay | Admitting: Orthopedic Surgery

## 2022-09-19 NOTE — Discharge Summary (Signed)
Physician Discharge Summary      Patient ID: Becky Murphy MRN: [id number] DOB/AGE: November 02, 1951 71 y.o.  Admit date: 09/03/2022 Discharge date: 09/05/2022  Admission Diagnoses:  Principal Problem:   Status post right knee replacement   Discharge Diagnoses:  Same  Surgeries: Procedure(s): RIGHT TOTAL KNEE ARTHROPLASTY-CEMENTED on 09/03/2022   Consultants:   Discharged Condition: Stable  Hospital Course: Becky Murphy is an 71 y.o. female who was admitted 09/03/2022 with a chief complaint of right knee pain, and found to have a diagnosis of right knee osteoarthritis.  They were brought to the operating room on 09/03/2022 and underwent the above named procedures.  Pt awoke from anesthesia without complication and was transferred to the floor. On POD1, patient's pain was controlled and she was able to mobilize with physical therapy, ambulating about 50 feet on POD 1..  On POD 2, patient progressed her gait distance to 75 feet and physical therapist felt she had progressed well enough for discharge home.  Patient was ready for discharge home with her husband.  Discharge on POD 2.  There were no red flag symptoms throughout her stay.  Pt will f/u with Dr. August Saucer in clinic in ~2 weeks.   Antibiotics given:  Anti-infectives (From admission, onward)    Start     Dose/Rate Route Frequency Ordered Stop   09/03/22 1315  ceFAZolin (ANCEF) IVPB 1 g/50 mL premix        1 g 100 mL/hr over 30 Minutes Intravenous Every 6 hours 09/03/22 1312 09/04/22 1314   09/03/22 0847  gentamicin (GARAMYCIN) injection  Status:  Discontinued          As needed 09/03/22 0848 09/03/22 1106   09/03/22 0846  nafcillin injection  Status:  Discontinued          As needed 09/03/22 0847 09/03/22 1106   09/03/22 0600  ceFAZolin (ANCEF) IVPB 2g/100 mL premix        2 g 200 mL/hr over 30 Minutes Intravenous On call to O.R. 09/03/22 4098 09/03/22 0754     .  Recent vital signs:  Vitals:   09/04/22 1920 09/05/22 0927  BP:  (!) 132/91 (!) 144/92  Pulse: 83 81  Resp:  17  Temp: 98.3 F (36.8 C) 98.6 F (37 C)  SpO2: 98% 95%    Recent laboratory studies:  Results for orders placed or performed during the hospital encounter of 08/28/22  Urine Culture   Specimen: Urine, Clean Catch  Result Value Ref Range   Specimen Description URINE, CLEAN CATCH    Special Requests      NONE Performed at [facility], [address], [place], [place] [address]    Culture >=100,000 COLONIES/mL ESCHERICHIA COLI (A)    Report Status 08/30/2022 FINAL    Organism ID, Bacteria ESCHERICHIA COLI (A)       Susceptibility   Escherichia coli - MIC*    AMPICILLIN <=2 SENSITIVE Sensitive     CEFAZOLIN <=4 SENSITIVE Sensitive     CEFEPIME <=0.12 SENSITIVE Sensitive     CEFTRIAXONE <=0.25 SENSITIVE Sensitive     CIPROFLOXACIN <=0.25 SENSITIVE Sensitive     GENTAMICIN <=1 SENSITIVE Sensitive     IMIPENEM <=0.25 SENSITIVE Sensitive     NITROFURANTOIN <=16 SENSITIVE Sensitive     TRIMETH/SULFA <=20 SENSITIVE Sensitive     AMPICILLIN/SULBACTAM <=2 SENSITIVE Sensitive     PIP/TAZO <=4 SENSITIVE Sensitive     * >=100,000 COLONIES/mL ESCHERICHIA COLI  Surgical pcr screen  Specimen: Nasal Mucosa; Nasal Swab  Result Value Ref Range   MRSA, PCR NEGATIVE NEGATIVE   Staphylococcus aureus NEGATIVE NEGATIVE  CBC  Result Value Ref Range   WBC 6.7 4.0 - 10.5 K/uL   RBC 5.02 3.87 - 5.11 MIL/uL   Hemoglobin 16.1 (H) 12.0 - 15.0 g/dL   HCT 46.6 (H) 36.0 - 46.0 %   MCV 92.8 80.0 - 100.0 fL   MCH 32.1 26.0 - 34.0 pg   MCHC 34.5 30.0 - 36.0 g/dL   RDW 14.5 11.5 - 15.5 %   Platelets 197 150 - 400 K/uL   nRBC 0.0 0.0 - 0.2 %  Basic metabolic panel  Result Value Ref Range   Sodium 142 135 - 145 mmol/L   Potassium 3.9 3.5 - 5.1 mmol/L   Chloride 108 98 - 111 mmol/L   CO2 24 22 - 32 mmol/L   Glucose, Bld 100 (H) 70 - 99 mg/dL   BUN 10 8 - 23 mg/dL   Creatinine, Ser 0.72 0.44 - 1.00 mg/dL   Calcium 9.3 8.9 - 10.3 mg/dL   GFR,  Estimated >60 >60 mL/min   Anion gap 10 5 - 15  Urinalysis, Complete w Microscopic  Result Value Ref Range   Color, Urine YELLOW YELLOW   APPearance CLEAR CLEAR   Specific Gravity, Urine 1.008 1.005 - 1.030   pH 7.0 5.0 - 8.0   Glucose, UA NEGATIVE NEGATIVE mg/dL   Hgb urine dipstick NEGATIVE NEGATIVE   Bilirubin Urine NEGATIVE NEGATIVE   Ketones, ur NEGATIVE NEGATIVE mg/dL   Protein, ur NEGATIVE NEGATIVE mg/dL   Nitrite NEGATIVE NEGATIVE   Leukocytes,Ua NEGATIVE NEGATIVE   RBC / HPF 0-5 0 - 5 RBC/hpf   WBC, UA 0-5 0 - 5 WBC/hpf   Bacteria, UA RARE (A) NONE SEEN   Squamous Epithelial / LPF 0-5 0 - 5    Discharge Medications:   Allergies as of 09/05/2022       Reactions   Vancomycin Rash   Cyanocobalamin [vitamin B12] Rash   Injection*        Medication List     STOP taking these medications    aspirin EC 325 MG tablet Replaced by: aspirin 81 MG chewable tablet   sulfaSALAzine 500 MG tablet Commonly known as: AZULFIDINE       TAKE these medications    aspirin 81 MG chewable tablet Chew 1 tablet (81 mg total) by mouth 2 (two) times daily. Replaces: aspirin EC 325 MG tablet   B-12 Fast Dissolve 5000 MCG Subl Generic drug: Methylcobalamin Place 5,000 mcg under the tongue daily.   celecoxib 100 MG capsule Commonly known as: CELEBREX Take 1 capsule (100 mg total) by mouth 2 (two) times daily.   cholecalciferol 25 MCG (1000 UNIT) tablet Commonly known as: VITAMIN D3 Take 1,000 Units by mouth daily.   diphenhydrAMINE 25 MG tablet Commonly known as: BENADRYL Take 25 mg by mouth daily as needed for allergies.   gabapentin 300 MG capsule Commonly known as: NEURONTIN Take 900 mg by mouth at bedtime.   methocarbamol 500 MG tablet Commonly known as: ROBAXIN Take 1 tablet (500 mg total) by mouth every 8 (eight) hours as needed for muscle spasms.        Diagnostic Studies: No results found.  Disposition: Discharge disposition: 01-Home or Self  Care       Discharge Instructions     Call MD / Call 911   Complete by: As directed    If  you experience chest pain or shortness of breath, CALL 911 and be transported to the hospital emergency room.  If you develope a fever above 101 F, pus (white drainage) or increased drainage or redness at the wound, or calf pain, call your surgeon's office.   Constipation Prevention   Complete by: As directed    Drink plenty of fluids.  Prune juice may be helpful.  You may use a stool softener, such as Colace (over the counter) 100 mg twice a day.  Use MiraLax (over the counter) for constipation as needed.   Diet - low sodium heart healthy   Complete by: As directed    Discharge instructions   Complete by: As directed    You may shower, dressing is waterproof.  Do not remove the dressing, we will remove it at your first post-op appointment.  Do not take a bath or soak the knee in a tub or pool.  You may weightbear as you can tolerate on the operative leg with a walker.  Continue using the CPM machine 3 times per day for one hour each time, increasing the degrees of range of motion daily.  Use the blue cradle boot under your heel to work on getting your leg straight.  Do NOT put a pillow under your knee.  You will follow-up with Dr. August Saucer in the clinic in 2 weeks at your given appointment date.  Okay to restart your Sulfasalazine when you are 7 days out from surgery.   INSTRUCTIONS AFTER JOINT REPLACEMENT   Remove items at home which could result in a fall. This includes throw rugs or furniture in walking pathways ICE to the affected joint every three hours while awake for 30 minutes at a time, for at least the first 3-5 days, and then as needed for pain and swelling.  Continue to use ice for pain and swelling. You may notice swelling that will progress down to the foot and ankle.  This is normal after surgery.  Elevate your leg when you are not up walking on it.   Continue to use the breathing machine  you got in the hospital (incentive spirometer) which will help keep your temperature down.  It is common for your temperature to cycle up and down following surgery, especially at night when you are not up moving around and exerting yourself.  The breathing machine keeps your lungs expanded and your temperature down.   DIET:  As you were doing prior to hospitalization, we recommend a well-balanced diet.  DRESSING / WOUND CARE / SHOWERING  Keep the surgical dressing until follow up.  The dressing is water proof, so you can shower without any extra covering.  IF THE DRESSING FALLS OFF or the wound gets wet inside, change the dressing with sterile gauze.  Please use good hand washing techniques before changing the dressing.  Do not use any lotions or creams on the incision until instructed by your surgeon.    ACTIVITY  Increase activity slowly as tolerated, but follow the weight bearing instructions below.   No driving for 6 weeks or until further direction given by your physician.  You cannot drive while taking narcotics.  No lifting or carrying greater than 10 lbs. until further directed by your surgeon. Avoid periods of inactivity such as sitting longer than an hour when not asleep. This helps prevent blood clots.  You may return to work once you are authorized by your doctor.     WEIGHT BEARING   Weight  bearing as tolerated with assist device (walker, cane, etc) as directed, use it as long as suggested by your surgeon or therapist, typically at least 4-6 weeks.   EXERCISES  Results after joint replacement surgery are often greatly improved when you follow the exercise, range of motion and muscle strengthening exercises prescribed by your doctor. Safety measures are also important to protect the joint from further injury. Any time any of these exercises cause you to have increased pain or swelling, decrease what you are doing until you are comfortable again and then slowly increase them. If  you have problems or questions, call your caregiver or physical therapist for advice.   Rehabilitation is important following a joint replacement. After just a few days of immobilization, the muscles of the leg can become weakened and shrink (atrophy).  These exercises are designed to build up the tone and strength of the thigh and leg muscles and to improve motion. Often times heat used for twenty to thirty minutes before working out will loosen up your tissues and help with improving the range of motion but do not use heat for the first two weeks following surgery (sometimes heat can increase post-operative swelling).   These exercises can be done on a training (exercise) mat, on the floor, on a table or on a bed. Use whatever works the best and is most comfortable for you.    Use music or television while you are exercising so that the exercises are a pleasant break in your day. This will make your life better with the exercises acting as a break in your routine that you can look forward to.   Perform all exercises about fifteen times, three times per day or as directed.  You should exercise both the operative leg and the other leg as well.  Exercises include:   Quad Sets - Tighten up the muscle on the front of the thigh (Quad) and hold for 5-10 seconds.   Straight Leg Raises - With your knee straight (if you were given a brace, keep it on), lift the leg to 60 degrees, hold for 3 seconds, and slowly lower the leg.  Perform this exercise against resistance later as your leg gets stronger.  Leg Slides: Lying on your back, slowly slide your foot toward your buttocks, bending your knee up off the floor (only go as far as is comfortable). Then slowly slide your foot back down until your leg is flat on the floor again.  Angel Wings: Lying on your back spread your legs to the side as far apart as you can without causing discomfort.  Hamstring Strength:  Lying on your back, push your heel against the floor  with your leg straight by tightening up the muscles of your buttocks.  Repeat, but this time bend your knee to a comfortable angle, and push your heel against the floor.  You may put a pillow under the heel to make it more comfortable if necessary.   A rehabilitation program following joint replacement surgery can speed recovery and prevent re-injury in the future due to weakened muscles. Contact your doctor or a physical therapist for more information on knee rehabilitation.    CONSTIPATION  Constipation is defined medically as fewer than three stools per week and severe constipation as less than one stool per week.  Even if you have a regular bowel pattern at home, your normal regimen is likely to be disrupted due to multiple reasons following surgery.  Combination of anesthesia, postoperative narcotics,  change in appetite and fluid intake all can affect your bowels.   YOU MUST use at least one of the following options; they are listed in order of increasing strength to get the job done.  They are all available over the counter, and you may need to use some, POSSIBLY even all of these options:    Drink plenty of fluids (prune juice may be helpful) and high fiber foods Colace 100 mg by mouth twice a day  Senokot for constipation as directed and as needed Dulcolax (bisacodyl), take with full glass of water  Miralax (polyethylene glycol) once or twice a day as needed.  If you have tried all these things and are unable to have a bowel movement in the first 3-4 days after surgery call either your surgeon or your primary doctor.    If you experience loose stools or diarrhea, hold the medications until you stool forms back up.  If your symptoms do not get better within 1 week or if they get worse, check with your doctor.  If you experience "the worst abdominal pain ever" or develop nausea or vomiting, please contact the office immediately for further recommendations for treatment.   ITCHING:  If you  experience itching with your medications, try taking only a single pain pill, or even half a pain pill at a time.  You can also use Benadryl over the counter for itching or also to help with sleep.   TED HOSE STOCKINGS:  Use stockings on both legs until for at least 2 weeks or as directed by physician office. They may be removed at night for sleeping.  MEDICATIONS:  See your medication summary on the "After Visit Summary" that nursing will review with you.  You may have some home medications which will be placed on hold until you complete the course of blood thinner medication.  It is important for you to complete the blood thinner medication as prescribed.  PRECAUTIONS:  If you experience chest pain or shortness of breath - call 911 immediately for transfer to the hospital emergency department.   If you develop a fever greater that 101 F, purulent drainage from wound, increased redness or drainage from wound, foul odor from the wound/dressing, or calf pain - CONTACT YOUR SURGEON.                                                   FOLLOW-UP APPOINTMENTS:  If you do not already have a post-op appointment, please call the office for an appointment to be seen by your surgeon.  Guidelines for how soon to be seen are listed in your "After Visit Summary", but are typically between 1-4 weeks after surgery.  OTHER INSTRUCTIONS:   Knee Replacement:  Do not place pillow under knee, focus on keeping the knee straight while resting. CPM instructions: 0-90 degrees, 2 hours in the morning, 2 hours in the afternoon, and 2 hours in the evening. Place foam block, curve side up under heel at all times except when in CPM or when walking.  DO NOT modify, tear, cut, or change the foam block in any way.  POST-OPERATIVE OPIOID TAPER INSTRUCTIONS: It is important to wean off of your opioid medication as soon as possible. If you do not need pain medication after your surgery it is ok to stop day one. Opioids  include:  Codeine, Hydrocodone(Norco, Vicodin), Oxycodone(Percocet, oxycontin) and hydromorphone amongst others.  Long term and even short term use of opiods can cause: Increased pain response Dependence Constipation Depression Respiratory depression And more.  Withdrawal symptoms can include Flu like symptoms Nausea, vomiting And more Techniques to manage these symptoms Hydrate well Eat regular healthy meals Stay active Use relaxation techniques(deep breathing, meditating, yoga) Do Not substitute Alcohol to help with tapering If you have been on opioids for less than two weeks and do not have pain than it is ok to stop all together.  Plan to wean off of opioids This plan should start within one week post op of your joint replacement. Maintain the same interval or time between taking each dose and first decrease the dose.  Cut the total daily intake of opioids by one tablet each day Next start to increase the time between doses. The last dose that should be eliminated is the evening dose.   MAKE SURE YOU:  Understand these instructions.  Get help right away if you are not doing well or get worse.    Thank you for letting us be a part of your medical care team.  It is a privilege we respect greatly.  We hope these instructions will help you stay on track for a fast and full recovery!    Dental Antibiotics:  In most cases prophylactic antibiotics for Dental procdeures after total joint surgery are not necessary.  Exceptions are as follows:  1. History of prior total joint infection  2. Severely immunocompromised (Organ Transplant, cancer chemotherapy, Rheumatoid biologic meds such as Humera)  3. Poorly controlled diabetes (A1C &gt; 8.0, blood glucose over 200)  If you have one of these conditions, contact your surgeon for an antibiotic prescription, prior to your dental procedure.   Increase activity slowly as tolerated   Complete by: As directed    Post-operative  opioid taper instructions:   Complete by: As directed    POST-OPERATIVE OPIOID TAPER INSTRUCTIONS: It is important to wean off of your opioid medication as soon as possible. If you do not need pain medication after your surgery it is ok to stop day one. Opioids include: Codeine, Hydrocodone(Norco, Vicodin), Oxycodone(Percocet, oxycontin) and hydromorphone amongst others.  Long term and even short term use of opiods can cause: Increased pain response Dependence Constipation Depression Respiratory depression And more.  Withdrawal symptoms can include Flu like symptoms Nausea, vomiting And more Techniques to manage these symptoms Hydrate well Eat regular healthy meals Stay active Use relaxation techniques(deep breathing, meditating, yoga) Do Not substitute Alcohol to help with tapering If you have been on opioids for less than two weeks and do not have pain than it is ok to stop all together.  Plan to wean off of opioids This plan should start within one week post op of your joint replacement. Maintain the same interval or time between taking each dose and first decrease the dose.  Cut the total daily intake of opioids by one tablet each day Next start to increase the time between doses. The last dose that should be eliminated is the evening dose.           Follow-up Information     Kaleen Mask, MD Follow up.   Specialty: Family Medicine Contact information: [address] [place] [address] [phone]         [facility] Follow up.   Specialty: Home Health Services Why: home health PT services will be provided by [facility]  Health Contact information: [address] [place] [address] [phone]         [facility] Follow up.   Specialty: Orthopedics Why: Follow-up with Dr. August Saucer in clinic 2 weeks postoperatively Contact information: [address] [place]  [id number] [phone]                 Signed: Karenann Cai 09/19/2022, 11:40 AM

## 2022-09-19 NOTE — Progress Notes (Signed)
Post-Op Visit Note   Patient: Becky Murphy           Date of Birth: Nov 04, 1951           MRN: [id number] Visit Date: 09/18/2022 PCP: Kaleen Mask, MD   Assessment & Plan:  Chief Complaint:  Chief Complaint  Patient presents with   Right Knee - Routine Post Op     09/03/22 (2w 1d) Right Total Knee Arthroplasty-cemented -     Visit Diagnoses:  1. Status post total right knee replacement     Plan: Becky Murphy is a 71 year old patient with right cemented total knee replacement on 523.  Doing well overall.  Having some left knee pain.  On exam she has range of motion of 15-1 20.  The left for her to continue with home health physical therapy as she is homebound.  Recheck in 2 weeks on full extension as well as incision particularly the distal lateral aspect.  No fluctuance or drainage today.  Oxycodone refilled.  Follow-Up Instructions: No follow-ups on file.   Orders:  Orders Placed This Encounter  Procedures   XR Knee 1-2 Views Right   Meds ordered this encounter  Medications   oxyCODONE (OXY IR/ROXICODONE) 5 MG immediate release tablet    Sig: Take 1 tablet (5 mg total) by mouth every 6 (six) hours as needed for moderate pain (pain score 4-6).    Dispense:  30 tablet    Refill:  0    Imaging: No results found.  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   High risk medication use 04/17/2022   Vitamin B 12 deficiency 04/03/2022   Iron deficiency anemia 03/29/2022   Bilateral wrist pain 02/27/2022   Hemarthrosis 02/08/2022   Synovitis of left knee 07/19/2021   Vancomycin adverse reaction 04/19/2021   Therapeutic drug monitoring 04/19/2021   Synovitis of right knee 04/04/2020   Long term (current) use of anticoagulants [Z79.01] 03/14/2016   S/P minimally invasive mitral valve repair 03/06/2016   Pancreatic mass 01/29/2016   Thyroid  nodule 01/29/2016   Exertional dyspnea 01/05/2016   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   Mitral insufficiency    Mitral valve prolapse 12/26/2015   Anxiety 11/02/2015   Past Medical History:  Diagnosis Date   Anemia    low iron in the past   Anxiety    pt denies   Arthritis    "back?" (11/03/2015) knees/hands (08/28/22)   Chest pain 11/02/2015   Depression    pt denies   Exertional dyspnea 01/05/2016   GERD (gastroesophageal reflux disease)    Heart murmur    History of peptic ulcer "late 1970's"   Migraine    "maybe 3-4/yr now" (11/03/2015)   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   Complex valvuloplasty including triangular resection of posterior leaflet, artificial Gore-tex neochord placement x6 and 34 mm Memo 3D ring annuloplasty via right mini thoracotomy approach with clipping of LA appendage   Thyroid nodule 01/29/2016   2.4 cm  enhancing mixed cystic and solid nodule inferior left thyroid gland noted on CT angiogram    Family History  Problem Relation Age of Onset   Hypertension Mother    Cancer Mother 42       MELANOMA   Cancer Father        PROSTATE   Cancer Sister 21       BREAST    Past Surgical History:  Procedure Laterality Date   BACK SURGERY     BREAST BIOPSY Left ~ 2005   BREAST LUMPECTOMY Left ~ 2005   CARDIAC CATHETERIZATION N/A 01/16/2016   Procedure: Right/Left Heart Cath and Coronary Angiography;  Surgeon: Sanda Klein, MD;  Location: [facility];  Service: Cardiovascular;  Laterality: N/A;   CLIPPING OF ATRIAL APPENDAGE Left 03/06/2016    Procedure: CLIPPING OF LEFT ATRIAL APPENDAGE using a 56 PRO2 AtriClip;  Surgeon: Rexene Alberts, MD;  Location: [place];  Service: Open Heart Surgery;  Laterality: Left;   COLONOSCOPY     EUS N/A 02/20/2016   Procedure: ESOPHAGEAL ENDOSCOPIC ULTRASOUND (EUS) RADIAL;  Surgeon: Arta Silence, MD;  Location: [facility];  Service: Endoscopy;  Laterality: N/A;   EYE MUSCLE SURGERY Bilateral 1970's?   KNEE ARTHROSCOPY Right 04/04/2020   Procedure: RIGHT KNEE ARTHROSCOPY WITH DEBRIDEMENT;  Surgeon: Meredith Pel, MD;  Location: [place];  Service: Orthopedics;  Laterality: Right;   KNEE ARTHROSCOPY Right 04/10/2021   Procedure: right knee arthroscopy, debridement, placement of antibiotic beads;  Surgeon: Meredith Pel, MD;  Location: [place];  Service: Orthopedics;  Laterality: Right;   KNEE ARTHROSCOPY Left 12/10/2021   Procedure: LEFT KNEE ARTHROSCOPY, DEBRIDEMENT, PLACEMENT OF STIMULAN BEADS;  Surgeon: Meredith Pel, MD;  Location: [place];  Service: Orthopedics;  Laterality: Left;   [place]   "trimmed bulges off both sides"   MITRAL VALVE REPAIR Right 03/06/2016   Procedure: MINIMALLY INVASIVE MITRAL VALVE REPAIR (MVR) using a 37 Sorin Memo 3D Ring;  Surgeon: Rexene Alberts, MD;  Location: [facility];  Service: Open Heart Surgery;  Laterality: Right;   TEE WITHOUT CARDIOVERSION N/A 01/05/2016   Procedure: TRANSESOPHAGEAL ECHOCARDIOGRAM (TEE);  Surgeon: Sanda Klein, MD;  Location: [facility];  Service: Cardiovascular;  Laterality: N/A;   TEE WITHOUT CARDIOVERSION N/A 03/06/2016   Procedure: TRANSESOPHAGEAL ECHOCARDIOGRAM (TEE);  Surgeon: Rexene Alberts, MD;  Location: [place];  Service: Open Heart Surgery;  Laterality: N/A;   TOTAL KNEE ARTHROPLASTY Right 09/03/2022   Procedure: RIGHT TOTAL KNEE ARTHROPLASTY-CEMENTED;  Surgeon: Meredith Pel, MD;  Location: [place];  Service: Orthopedics;  Laterality: Right;   TUBAL LIGATION     Social History   Occupational History   Not  on file  Tobacco Use   Smoking status: Never    Passive exposure: Current   Smokeless tobacco: Never  Vaping Use   Vaping Use: Never used  Substance and Sexual Activity   Alcohol use: Yes    Alcohol/week: 10.0 standard drinks of alcohol    Types: 6 Glasses of wine, 4 Shots of liquor per week    Comment: wine every other day   Drug use: No   Sexual activity: Not Currently    Birth control/protection: Post-menopausal

## 2022-09-22 DIAGNOSIS — M1710 Unilateral primary osteoarthritis, unspecified knee: Secondary | ICD-10-CM

## 2022-09-22 DIAGNOSIS — M1712 Unilateral primary osteoarthritis, left knee: Secondary | ICD-10-CM

## 2022-10-07 ENCOUNTER — Ambulatory Visit (INDEPENDENT_AMBULATORY_CARE_PROVIDER_SITE_OTHER): Payer: Medicare Other | Admitting: Orthopedic Surgery

## 2022-10-07 DIAGNOSIS — Z96651 Presence of right artificial knee joint: Secondary | ICD-10-CM

## 2022-10-10 ENCOUNTER — Encounter: Payer: Self-pay | Admitting: Orthopedic Surgery

## 2022-10-10 NOTE — Progress Notes (Signed)
Post-Op Visit Note   Patient: Becky Murphy           Date of Birth: 10/13/1951           MRN: [id number] Visit Date: 10/07/2022 PCP: Kaleen Mask, MD   Assessment & Plan:  Chief Complaint:  Chief Complaint  Patient presents with   Right Knee - Routine Post Op     09/03/22 (4w 6d) Right Total Knee Arthroplasty-cemented     Visit Diagnoses:  1. Status post total right knee replacement     Plan: Becky Murphy is a 71 year old patient who underwent right total knee replacement 09/03/2022.  States her right knee is doing very well.  She is making good progress achieving near full extension.  Physical therapy notes from home health therapy reviewed today.  She flexes easily past 90 and has about 5 to 8 degree flexion contracture remaining which is significantly improved.  No effusion in the knee and no calf tenderness negative Homans.  Plan at this time is continue home health PT as the patient is really not able to get around enough to drive and she is not able to get out of the house and remains homebound.  We will have her continue that home health PT for 1 more visit and then begin physical therapy next week starting on Friday 2 times a week for 6 weeks for range of motion especially extension and strengthening.  Likely posterior for left total knee replacement in 6 weeks after she has had a little bit more time to get over this surgery.  Follow-Up Instructions: No follow-ups on file.   Orders:  Orders Placed This Encounter  Procedures   Ambulatory referral to Physical Therapy   No orders of the defined types were placed in this encounter.   Imaging: No results found.  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   High risk medication use 04/17/2022   Vitamin B 12 deficiency 04/03/2022   Iron deficiency anemia 03/29/2022    Bilateral wrist pain 02/27/2022   Hemarthrosis 02/08/2022   Synovitis of left knee 07/19/2021   Vancomycin adverse reaction 04/19/2021   Therapeutic drug monitoring 04/19/2021   Synovitis of right knee 04/04/2020   Long term (current) use of anticoagulants [Z79.01] 03/14/2016   S/P minimally invasive mitral valve repair 03/06/2016   Pancreatic mass 01/29/2016   Thyroid nodule 01/29/2016   Exertional dyspnea 01/05/2016   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   Mitral insufficiency    Mitral valve prolapse 12/26/2015   Anxiety 11/02/2015   Past Medical History:  Diagnosis Date   Anemia    low iron in the past   Anxiety    pt denies   Arthritis    "back?" (11/03/2015) knees/hands (08/28/22)   Chest pain 11/02/2015   Depression    pt denies   Exertional dyspnea 01/05/2016   GERD (gastroesophageal reflux disease)    Heart murmur    History of peptic ulcer "late 1970's"   Migraine    "maybe 3-4/yr now" (11/03/2015)   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   Pancreatic mass 01/29/2016   2.8 cm enhancing mass in the pancreatic neck and 3.1 cm low-attenuation cystic lesion in body of pancreas noted on  CT angiogram   PONV (postoperative nausea and vomiting)    S/P minimally invasive mitral valve repair 03/06/2016   Complex valvuloplasty including triangular resection of posterior leaflet, artificial Gore-tex neochord placement x6 and 34 mm Memo 3D ring annuloplasty via right mini thoracotomy approach with clipping of LA appendage   Thyroid nodule 01/29/2016   2.4 cm enhancing mixed cystic and solid nodule inferior left thyroid gland noted on CT angiogram    Family History  Problem Relation Age of Onset   Hypertension Mother    Cancer Mother 29       MELANOMA   Cancer Father        PROSTATE   Cancer Sister 43       BREAST    Past  Surgical History:  Procedure Laterality Date   BACK SURGERY     BREAST BIOPSY Left ~ 2005   BREAST LUMPECTOMY Left ~ 2005   CARDIAC CATHETERIZATION N/A 01/16/2016   Procedure: Right/Left Heart Cath and Coronary Angiography;  Surgeon: Thurmon Fair, MD;  Location: [facility];  Service: Cardiovascular;  Laterality: N/A;   CLIPPING OF ATRIAL APPENDAGE Left 03/06/2016   Procedure: CLIPPING OF LEFT ATRIAL APPENDAGE using a 45 PRO2 AtriClip;  Surgeon: Purcell Nails, MD;  Location: [facility];  Service: Open Heart Surgery;  Laterality: Left;   COLONOSCOPY     EUS N/A 02/20/2016   Procedure: ESOPHAGEAL ENDOSCOPIC ULTRASOUND (EUS) RADIAL;  Surgeon: Willis Modena, MD;  Location: [facility];  Service: Endoscopy;  Laterality: N/A;   EYE MUSCLE SURGERY Bilateral 1970's?   KNEE ARTHROSCOPY Right 04/04/2020   Procedure: RIGHT KNEE ARTHROSCOPY WITH DEBRIDEMENT;  Surgeon: Cammy Copa, MD;  Location: [facility];  Service: Orthopedics;  Laterality: Right;   KNEE ARTHROSCOPY Right 04/10/2021   Procedure: right knee arthroscopy, debridement, placement of antibiotic beads;  Surgeon: Cammy Copa, MD;  Location: [facility];  Service: Orthopedics;  Laterality: Right;   KNEE ARTHROSCOPY Left 12/10/2021   Procedure: LEFT KNEE ARTHROSCOPY, DEBRIDEMENT, PLACEMENT OF STIMULAN BEADS;  Surgeon: Cammy Copa, MD;  Location: [facility];  Service: Orthopedics;  Laterality: Left;   LUMBAR DISC SURGERY  1992   "trimmed bulges off both sides"   MITRAL VALVE REPAIR Right 03/06/2016   Procedure: MINIMALLY INVASIVE MITRAL VALVE REPAIR (MVR) using a 34 Sorin Memo 3D Ring;  Surgeon: Purcell Nails, MD;  Location: [facility];  Service: Open Heart Surgery;  Laterality: Right;   TEE WITHOUT CARDIOVERSION N/A 01/05/2016   Procedure: TRANSESOPHAGEAL ECHOCARDIOGRAM (TEE);  Surgeon: Thurmon Fair, MD;  Location: [facility];  Service: Cardiovascular;  Laterality: N/A;   TEE WITHOUT CARDIOVERSION N/A 03/06/2016   Procedure:  TRANSESOPHAGEAL ECHOCARDIOGRAM (TEE);  Surgeon: Purcell Nails, MD;  Location: [facility] OR;  Service: Open Heart Surgery;  Laterality: N/A;   TOTAL KNEE ARTHROPLASTY Right 09/03/2022   Procedure: RIGHT TOTAL KNEE ARTHROPLASTY-CEMENTED;  Surgeon: Cammy Copa, MD;  Location: [facility] OR;  Service: Orthopedics;  Laterality: Right;   TUBAL LIGATION     Social History   Occupational History   Not on file  Tobacco Use   Smoking status: Never    Passive exposure: Current   Smokeless tobacco: Never  Vaping Use   Vaping Use: Never used  Substance and Sexual Activity   Alcohol use: Yes    Alcohol/week: 10.0 standard drinks of alcohol    Types: 6 Glasses of wine, 4 Shots of liquor per week    Comment: wine every other day   Drug  use: No   Sexual activity: Not Currently    Birth control/protection: Post-menopausal

## 2022-10-14 ENCOUNTER — Other Ambulatory Visit: Payer: Self-pay

## 2022-10-14 ENCOUNTER — Encounter: Payer: Self-pay | Admitting: Physical Therapy

## 2022-10-14 ENCOUNTER — Ambulatory Visit (INDEPENDENT_AMBULATORY_CARE_PROVIDER_SITE_OTHER): Payer: Medicare Other | Admitting: Physical Therapy

## 2022-10-14 DIAGNOSIS — M6281 Muscle weakness (generalized): Secondary | ICD-10-CM

## 2022-10-14 DIAGNOSIS — R600 Localized edema: Secondary | ICD-10-CM

## 2022-10-14 DIAGNOSIS — M25661 Stiffness of right knee, not elsewhere classified: Secondary | ICD-10-CM | POA: Diagnosis not present

## 2022-10-14 DIAGNOSIS — M25561 Pain in right knee: Secondary | ICD-10-CM | POA: Diagnosis not present

## 2022-10-14 DIAGNOSIS — R2681 Unsteadiness on feet: Secondary | ICD-10-CM

## 2022-10-14 DIAGNOSIS — R2689 Other abnormalities of gait and mobility: Secondary | ICD-10-CM

## 2022-10-14 NOTE — Therapy (Signed)
OUTPATIENT PHYSICAL THERAPY LOWER EXTREMITY EVALUATION   Patient Name: Becky Murphy MRN: [id number] DOB:29-Sep-1951, 71 y.o., female Today's Date: 10/14/2022   PT End of Session - 10/14/22 1028     Visit Number 1    Number of Visits 16    Date for PT Re-Evaluation 12/06/22    Authorization Type Medicare    PT Start Time 0845    PT Stop Time 0930    PT Time Calculation (min) 45 min    Activity Tolerance Patient tolerated treatment well;Patient limited by pain    Behavior During Therapy 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   Complex valvuloplasty including triangular resection of posterior leaflet, artificial Gore-tex neochord placement x6 and 34 mm Memo 3D ring annuloplasty via right mini thoracotomy approach with clipping of LA appendage   Thyroid nodule 01/29/2016   2.4 cm enhancing mixed cystic and solid nodule inferior left thyroid gland noted on CT angiogram   Past Surgical History:  Procedure Laterality Date   BACK SURGERY     BREAST BIOPSY Left ~ 2005   BREAST LUMPECTOMY Left ~ 2005   CARDIAC CATHETERIZATION N/A 01/16/2016   Procedure: Right/Left Heart  Cath and Coronary Angiography;  Surgeon: Sanda Klein, MD;  Location: [place] [facility];  Service: Cardiovascular;  Laterality: N/A;   CLIPPING OF ATRIAL APPENDAGE Left 03/06/2016   Procedure: CLIPPING OF LEFT ATRIAL APPENDAGE using a 75 PRO2 AtriClip;  Surgeon: Rexene Alberts, MD;  Location: [place];  Service: Open Heart Surgery;  Laterality: Left;   COLONOSCOPY     EUS N/A 02/20/2016   Procedure: ESOPHAGEAL ENDOSCOPIC ULTRASOUND (EUS) RADIAL;  Surgeon: Arta Silence, MD;  Location: [facility];  Service: Endoscopy;  Laterality: N/A;   EYE MUSCLE SURGERY Bilateral 1970's?   KNEE ARTHROSCOPY Right 04/04/2020   Procedure: RIGHT KNEE ARTHROSCOPY WITH DEBRIDEMENT;  Surgeon: Meredith Pel, MD;  Location: [place];  Service: Orthopedics;  Laterality: Right;   KNEE ARTHROSCOPY Right 04/10/2021   Procedure: right knee arthroscopy, debridement, placement of antibiotic beads;  Surgeon: Meredith Pel, MD;  Location: [place];  Service: Orthopedics;  Laterality: Right;   KNEE ARTHROSCOPY Left 12/10/2021   Procedure: LEFT KNEE ARTHROSCOPY, DEBRIDEMENT, PLACEMENT OF STIMULAN BEADS;  Surgeon: Meredith Pel, MD;  Location: [place];  Service: Orthopedics;  Laterality: Left;   [place]   "trimmed bulges off both sides"   MITRAL VALVE REPAIR Right 03/06/2016   Procedure: MINIMALLY INVASIVE MITRAL VALVE REPAIR (MVR) using a 52 Sorin Memo 3D  Ring;  Surgeon: Purcell Nails, MD;  Location[facility] OR;  Service: Open Heart Surgery;  Laterality: Right;   TEE WITHOUT CARDIOVERSION N/A 01/05/2016   Procedure: TRANSESOPHAGEAL ECHOCARDIOGRAM (TEE);  Surgeon: Thurmon Fair, MD;  Location: [facility];  Service: Cardiovascular;  Laterality: N/A;   TEE WITHOUT CARDIOVERSION N/A 03/06/2016   Procedure: TRANSESOPHAGEAL ECHOCARDIOGRAM (TEE);  Surgeon: Purcell Nails, MD;  Location: [facility] OR;  Service: Open Heart Surgery;  Laterality: N/A;   TOTAL KNEE ARTHROPLASTY Right 09/03/2022   Procedure: RIGHT TOTAL KNEE  ARTHROPLASTY-CEMENTED;  Surgeon: Cammy Copa, MD;  Location: [facility] OR;  Service: Orthopedics;  Laterality: Right;   TUBAL LIGATION     Patient Active Problem List   Diagnosis Date Noted   Arthritis of knee    Status post right knee replacement 09/03/2022   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   High risk medication use 04/17/2022   Vitamin B 12 deficiency 04/03/2022   Iron deficiency anemia 03/29/2022   Bilateral wrist pain 02/27/2022   Hemarthrosis 02/08/2022   Synovitis of left knee 07/19/2021   Vancomycin adverse reaction 04/19/2021   Therapeutic drug monitoring 04/19/2021   Synovitis of right knee 04/04/2020   Long term (current) use of anticoagulants [Z79.01] 03/14/2016   S/P minimally invasive mitral valve repair 03/06/2016   Pancreatic mass 01/29/2016   Thyroid nodule 01/29/2016   Exertional dyspnea 01/05/2016   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   Mitral insufficiency    Mitral valve prolapse 12/26/2015   Anxiety 11/02/2015    PCP: Kaleen Mask, MD  REFERRING PROVIDER: Cammy Copa, MD  REFERRING DIAG: [phone] (ICD-10-CM) - Status post total right knee replacement  THERAPY DIAG:  Acute pain of right knee  Stiffness of right knee, not elsewhere classified  Muscle weakness (generalized)  Other abnormalities of gait and mobility  Unsteadiness on feet  Localized edema  Rationale for Evaluation and Treatment Rehabilitation  ONSET DATE: 09/03/2022  SUBJECTIVE:   SUBJECTIVE STATEMENT: She had right TKA on 09/03/2022 due to arthritis.  She had HHPT thru 10/08/22.  Ms. Liao continues to do her exercises and walk inside home with RW.  She had CPM but it has been picked up already.  Her left knee is weak giving way and bothers her more than RLE with gait.    PERTINENT HISTORY: Right TKA 09/03/22, Left Knee Arthroscopy 12/10/21, Seronegative Inflammatory Arthritis, MVP with repair, anxiety, pancreatic mass, back sg, TEE  cardioversion  PAIN:  Are you having pain? Yes: NPRS scale: 5/10 today & in last week lowest 4/10 highest 7/10 Pain location: both knees (right sides & back;  left on sides) Pain description: throbbing Aggravating factors: exercising like lifting leg for RLE & walking for LLE Relieving factors: aspirin at night, ice, Icy-Hot cream at night  PRECAUTIONS: Fall  WEIGHT BEARING RESTRICTIONS Yes RLE WBAT  FALLS:  Has patient fallen in last 6 months? No  LIVING ENVIRONMENT: Lives with: lives with their spouse Lives in: House/apartment one-story house Stairs: Yes: External: 1 X 2 steps; none Has following equipment at home: Single point cane, Environmental consultant - 2 wheeled, Wheelchair (manual), and Tour manager  OCCUPATION: retired  PLOF:    she has been w/c bound for last couple years due to knees.  She has been using RW since TKA  PATIENT GOALS   to be able to walk in/out restaurant, around house, work in garden / Geophysical data processor   OBJECTIVE:   DIAGNOSTIC FINDINGS: 09/18/22   AP lateral radiographs right knee  reviewed.  Right cemented total knee replacement in good position and alignment with no complicating features.Marland Kitchen  PATIENT SURVEYS:  10/14/2022:   FOTO 40% with target 54%  COGNITION: 10/14/2022:   Overall cognitive status: Within functional limits for tasks assessed    EDEMA:  10/14/2022:   RLE:  above knee 40.5cm  around knee  39.5cm  below knee  32.5cm  10/14/2022:   LLE:  above knee 37cm  around knee  37cm below knee 29cm  PALPATION: Right Knee Tenderness at patella tendon, hamstring tendons & medial /lateral joint line.  Denies tenderness at quad tendon, quadriceps or hamstring muscle bellies.  LOWER EXTREMITY ROM:  ROM P:passive  A:active Right eval Left eval  Hip flexion    Hip extension    Hip abduction    Hip adduction    Hip internal rotation    Hip external rotation    Knee flexion Seated P: 104* Seated P: 116*  Knee extension Seated P: -17* A: -26* Seated P:  -6* A: -24*  Ankle dorsiflexion    Ankle plantarflexion    Ankle inversion    Ankle eversion     (Blank rows = not tested)  LOWER EXTREMITY MMT:  MMT Right eval Left eval  Hip flexion    Hip extension    Hip abduction    Hip adduction    Hip internal rotation    Hip external rotation    Knee flexion 3-/5 3-/5  Knee extension 3-/5 3-/5  Ankle dorsiflexion    Ankle plantarflexion    Ankle inversion    Ankle eversion     (Blank rows = not tested)   GAIT: 10/14/2022:   Distance walked: 100' Assistive device utilized: Environmental consultant - 2 wheeled Level of assistance: SBA Comments: BLE flexed knees in stance with trunk flexion, decreased step length BLEs, step-to pattern leading with LLE, excessive weight bearing on RW    TODAY'S TREATMENT: Therex:         HEP instruction/performance c cues for techniques, handout provided.  Trial set performed of each for comprehension and symptom assessment.  See below for exercise list.  Additional time spent in encouragement for elevation >/= 2X/day for >/= 15 minutes.  Noted decrease in range for exercise based off symptoms.   PATIENT EDUCATION:  Education details: see HEP below Person educated: Patient Education method: Programmer, multimedia, Demonstration, Verbal cues, and Handouts Education comprehension: verbalized understanding, returned demonstration, verbal cues required, and needs further education   HOME EXERCISE PROGRAM: Access Code: [email or web address] URL: [email or web address] Date: 10/14/2022 Prepared by: Vladimir Faster  Exercises - Ankle Alphabet in Elevation  - 2-4 x daily - 7 x weekly - 1 sets - 1 reps - Quad Setting and Stretching  - 2-4 x daily - 7 x weekly - 5-10 sets - 10 reps - prop 5-10 minutes & quad set5 seconds hold - Supine Leg Press  - 2-4 x daily - 7 x weekly - 5-10 sets - 10 reps - 5 seconds hold - Supine Heel Slide with Strap  - 2-3 x daily - 7 x weekly - 2-3 sets - 10 reps - 5 seconds hold - Supine Straight Leg  Raises  - 2-3 x daily - 7 x weekly - 2-3 sets - 10 reps - 5 seconds hold - Seated Knee Flexion Extension AROM   - 2-4 x daily - 7 x weekly - 2-3 sets - 10 reps - 5 seconds hold - Seated straight leg lifts  - 2-3 x daily - 7  x weekly - 2-3 sets - 10 reps - 5 seconds hold - Seated Long Arc Quad  - 2-3 x daily - 7 x weekly - 2-3 sets - 10 reps - 5 seconds hold - Seated Hamstring Stretch with Strap  - 2-4 x daily - 7 x weekly - 1 sets - 3 reps - 20-30 seconds hold  ASSESSMENT:  CLINICAL IMPRESSION: Patient is a 71 y.o. female who was seen today for physical therapy evaluation and treatment for Right TKA. Patient demonstrates decreased strength, ROM, increased edema and pain with gait abnormalities affecting functional mobility.  OBJECTIVE IMPAIRMENTS Abnormal gait, decreased activity tolerance, decreased balance, decreased endurance, decreased knowledge of condition, decreased knowledge of use of DME, decreased mobility, difficulty walking, decreased ROM, decreased strength, increased edema, impaired flexibility, postural dysfunction, and pain.   ACTIVITY LIMITATIONS carrying, lifting, bending, sitting, standing, squatting, sleeping, stairs, transfers, and locomotion level  PARTICIPATION LIMITATIONS: meal prep, cleaning, laundry, driving, community activity, and yard work  PERSONAL FACTORS Fitness, Past/current experiences, and 1-2 comorbidities: see PMH  are also affecting patient's functional outcome.   REHAB POTENTIAL: Good  CLINICAL DECISION MAKING: Stable/uncomplicated  EVALUATION COMPLEXITY: Low   GOALS: Goals reviewed with patient? Yes  SHORT TERM GOALS: Target date: 11/08/2022  Patient independent and verbalizes compliance with initial HEP Baseline: SEE OBJECTIVE DATA Goal status: INITIAL  2.  Patient reports 50% improvement in right knee pain. Baseline: SEE OBJECTIVE DATA Goal status: INITIAL  3.  PROM right knee extension -3* to flexion 105* Baseline: SEE OBJECTIVE  DATA Goal status: INITIAL  LONG TERM GOALS: Target date: 12/06/2022  Patient will improve FOTO score to 54% Baseline: SEE OBJECTIVE DATA Goal status: INITIAL  2.  Patient reports right knee pain </= 2/10 with standing & gait activities.  Baseline: SEE OBJECTIVE DATA Goal status: INITIAL  3.  Right Knee PROM 0* extension to 115* flexion Baseline: SEE OBJECTIVE DATA Goal status: INITIAL  4.  Right Knee AROM seated -3* extension to 110* flexion Baseline: SEE OBJECTIVE DATA Goal status: INITIAL  5.  Patient ambulates >300' community distances including negotiating ramps, curbs & stairs with LRAD independently. Baseline: SEE OBJECTIVE DATA Goal status: INITIAL   PLAN: PT FREQUENCY: 2x/week  PT DURATION: 8 weeks  PLANNED INTERVENTIONS: Therapeutic exercises, Therapeutic activity, Neuromuscular re-education, Balance training, Gait training, Patient/Family education, Self Care, Joint mobilization, Stair training, DME instructions, Electrical stimulation, Cryotherapy, Moist heat, scar mobilization, Taping, Vasopneumatic device, Manual therapy, and physical performance testing  PLAN FOR NEXT SESSION: review & update HEP, therapeutic exercise & manual therapy for ROM,   Vladimir Faster, PT, DPT 10/14/2022, 12:30 PM

## 2022-10-16 ENCOUNTER — Inpatient Hospital Stay: Payer: Medicare Other | Admitting: Hematology and Oncology

## 2022-10-16 ENCOUNTER — Inpatient Hospital Stay: Payer: Medicare Other

## 2022-10-16 ENCOUNTER — Encounter: Payer: Self-pay | Admitting: Physical Therapy

## 2022-10-16 ENCOUNTER — Ambulatory Visit (INDEPENDENT_AMBULATORY_CARE_PROVIDER_SITE_OTHER): Payer: Medicare Other | Admitting: Physical Therapy

## 2022-10-16 DIAGNOSIS — M25561 Pain in right knee: Secondary | ICD-10-CM | POA: Diagnosis not present

## 2022-10-16 DIAGNOSIS — R2689 Other abnormalities of gait and mobility: Secondary | ICD-10-CM | POA: Diagnosis not present

## 2022-10-16 DIAGNOSIS — M25661 Stiffness of right knee, not elsewhere classified: Secondary | ICD-10-CM

## 2022-10-16 DIAGNOSIS — R600 Localized edema: Secondary | ICD-10-CM

## 2022-10-16 DIAGNOSIS — M6281 Muscle weakness (generalized): Secondary | ICD-10-CM

## 2022-10-16 DIAGNOSIS — R2681 Unsteadiness on feet: Secondary | ICD-10-CM

## 2022-10-16 NOTE — Therapy (Signed)
OUTPATIENT PHYSICAL THERAPY TREATMENT NOTE   Patient Name: Becky Murphy MRN: [id number] DOB:[birth year], 71 y.o., female Today's Date: 10/16/2022  PCP: Leonard Downing, MD REFERRING PROVIDER: Meredith Pel, MD  END OF SESSION:   PT End of Session - 10/16/22 1511     Visit Number 2    Number of Visits 16    Date for PT Re-Evaluation 12/06/22    Authorization Type Medicare    PT Start Time 1507    PT Stop Time 1552    PT Time Calculation (min) 45 min    Activity Tolerance Patient tolerated treatment well;Patient limited by pain    Behavior During Therapy [facility] for tasks assessed/performed             Past Medical History:  Diagnosis Date   Anemia    low iron in the past   Anxiety    pt denies   Arthritis    "back?" (11/03/2015) knees/hands (08/28/22)   Chest pain 11/02/2015   Depression    pt denies   Exertional dyspnea 01/05/2016   GERD (gastroesophageal reflux disease)    Heart murmur    History of peptic ulcer "late 1970's"   Migraine    "maybe 3-4/yr now" (11/03/2015)   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   Complex valvuloplasty including triangular resection of posterior leaflet, artificial Gore-tex neochord placement x6 and 34 mm Memo 3D ring annuloplasty via right mini thoracotomy approach with clipping of LA appendage   Thyroid nodule 01/29/2016   2.4 cm enhancing mixed cystic and solid nodule inferior left thyroid gland noted on CT angiogram   Past Surgical History:  Procedure Laterality Date   BACK SURGERY     BREAST BIOPSY Left ~ 2005   BREAST LUMPECTOMY  Left ~ 2005   CARDIAC CATHETERIZATION N/A 01/16/2016   Procedure: Right/Left Heart Cath and Coronary Angiography;  Surgeon: Sanda Klein, MD;  Location: [place] [facility];  Service: Cardiovascular;  Laterality: N/A;   CLIPPING OF ATRIAL APPENDAGE Left 03/06/2016   Procedure: CLIPPING OF LEFT ATRIAL APPENDAGE using a 39 PRO2 AtriClip;  Surgeon: Rexene Alberts, MD;  Location: [place];  Service: Open Heart Surgery;  Laterality: Left;   COLONOSCOPY     EUS N/A 02/20/2016   Procedure: ESOPHAGEAL ENDOSCOPIC ULTRASOUND (EUS) RADIAL;  Surgeon: Arta Silence, MD;  Location: [facility];  Service: Endoscopy;  Laterality: N/A;   EYE MUSCLE SURGERY Bilateral 1970's?   KNEE ARTHROSCOPY Right 04/04/2020   Procedure: RIGHT KNEE ARTHROSCOPY WITH DEBRIDEMENT;  Surgeon: Meredith Pel, MD;  Location: [place];  Service: Orthopedics;  Laterality: Right;   KNEE ARTHROSCOPY Right 04/10/2021   Procedure: right knee arthroscopy, debridement, placement of antibiotic beads;  Surgeon: Meredith Pel, MD;  Location: [place];  Service: Orthopedics;  Laterality: Right;   KNEE ARTHROSCOPY Left 12/10/2021   Procedure: LEFT KNEE ARTHROSCOPY, DEBRIDEMENT, PLACEMENT OF STIMULAN BEADS;  Surgeon: Meredith Pel, MD;  Location: [place];  Service: Orthopedics;  Laterality: Left;   [place]   "trimmed bulges off both sides"   MITRAL VALVE REPAIR Right 03/06/2016  Procedure: MINIMALLY INVASIVE MITRAL VALVE REPAIR (MVR) using a 34 Sorin Memo 3D Ring;  Surgeon: Rexene Alberts, MD;  Location: [place];  Service: Open Heart Surgery;  Laterality: Right;   TEE WITHOUT CARDIOVERSION N/A 01/05/2016   Procedure: TRANSESOPHAGEAL ECHOCARDIOGRAM (TEE);  Surgeon: Sanda Klein, MD;  Location: [facility];  Service: Cardiovascular;  Laterality: N/A;   TEE WITHOUT CARDIOVERSION N/A 03/06/2016   Procedure: TRANSESOPHAGEAL ECHOCARDIOGRAM (TEE);  Surgeon: Rexene Alberts, MD;  Location: [place];  Service: Open Heart Surgery;   Laterality: N/A;   TOTAL KNEE ARTHROPLASTY Right 09/03/2022   Procedure: RIGHT TOTAL KNEE ARTHROPLASTY-CEMENTED;  Surgeon: Meredith Pel, MD;  Location: [place];  Service: Orthopedics;  Laterality: Right;   TUBAL LIGATION     Patient Active Problem List   Diagnosis Date Noted   Arthritis of knee    Status post right knee replacement 09/03/2022   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   High risk medication use 04/17/2022   Vitamin B 12 deficiency 04/03/2022   Iron deficiency anemia 03/29/2022   Bilateral wrist pain 02/27/2022   Hemarthrosis 02/08/2022   Synovitis of left knee 07/19/2021   Vancomycin adverse reaction 04/19/2021   Therapeutic drug monitoring 04/19/2021   Synovitis of right knee 04/04/2020   Long term (current) use of anticoagulants [Z79.01] 03/14/2016   S/P minimally invasive mitral valve repair 03/06/2016   Pancreatic mass 01/29/2016   Thyroid nodule 01/29/2016   Exertional dyspnea 01/05/2016   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   Mitral insufficiency    Mitral valve prolapse 12/26/2015   Anxiety 11/02/2015    REFERRING DIAG: Z96.651 (ICD-10-CM) - Status post total right knee replacement  ONSET DATE: 09/03/2022  THERAPY DIAG:  Acute pain of right knee  Stiffness of right knee, not elsewhere classified  Muscle weakness (generalized)  Other abnormalities of gait and mobility  Unsteadiness on feet  Localized edema  Rationale for Evaluation and Treatment Rehabilitation  PERTINENT HISTORY: Right TKA 09/03/22, Left Knee Arthroscopy 12/10/21, Seronegative Inflammatory Arthritis, MVP with repair, anxiety, pancreatic mass, back sg, TEE cardioversion  PRECAUTIONS: Fall  SUBJECTIVE:                                                                                                                                                                                      SUBJECTIVE STATEMENT:  The exercises have been going well.  She is  elevating her legs 4x/day with some ankle motions like PT recommended.   PAIN:  Are you having pain? Yes: NPRS scale: 3/10 today & since last PT lowest 3/10 highest 5/10 Pain location: both knees (right sides & back;  left on sides) Pain  description: throbbing Aggravating factors: exercising like lifting leg for RLE & walking for LLE Relieving factors: aspirin at night, ice, Icy-Hot cream at night  OBJECTIVE: (objective measures completed at initial evaluation unless otherwise dated)    DIAGNOSTIC FINDINGS: 09/18/22   AP lateral radiographs right knee reviewed.  Right cemented total knee replacement in good position and alignment with no complicating features.Marland Kitchen   PATIENT SURVEYS:  10/14/2022:   FOTO 40% with target 54%   COGNITION: 10/14/2022:   Overall cognitive status: Within functional limits for tasks assessed                 EDEMA:  10/14/2022:   RLE:  above knee 40.5cm  around knee  39.5cm  below knee  32.5cm  10/14/2022:   LLE:  above knee 37cm  around knee  37cm below knee 29cm   PALPATION: Right Knee Tenderness at patella tendon, hamstring tendons & medial /lateral joint line.  Denies tenderness at quad tendon, quadriceps or hamstring muscle bellies.   LOWER EXTREMITY ROM:   ROM P:passive  A:active Right eval Left eval  Hip flexion      Hip extension      Hip abduction      Hip adduction      Hip internal rotation      Hip external rotation      Knee flexion Seated P: 104* Seated P: 116*  Knee extension Seated P: -17* A: -26* Seated P: -6* A: -24*  Ankle dorsiflexion      Ankle plantarflexion      Ankle inversion      Ankle eversion       (Blank rows = not tested)   LOWER EXTREMITY MMT:   MMT Right eval Left eval  Hip flexion      Hip extension      Hip abduction      Hip adduction      Hip internal rotation      Hip external rotation      Knee flexion 3-/5 3-/5  Knee extension 3-/5 3-/5  Ankle dorsiflexion      Ankle plantarflexion       Ankle inversion      Ankle eversion       (Blank rows = not tested)     GAIT: 10/14/2022:   Distance walked: 100' Assistive device utilized: Environmental consultant - 2 wheeled Level of assistance: SBA Comments: BLE flexed knees in stance with trunk flexion, decreased step length BLEs, step-to pattern leading with LLE, excessive weight bearing on RW       TODAY'S TREATMENT: 10/16/2022: Therapeutic Exercise: NuStep seat 8 with BLEs/BUEs level 5 for 6 min then LEs only for 2 min Seated LAQ & active knee flexion with contralateral LE opposing motion for 5 sec hold 10 reps 2 sets Gastroc stretch on incline board 30 sec hold 2 reps Heel raises on incline board 10 reps with BUE support Hamstring stretch with RLE ext on bed with strap DF 30 sec hold 2 reps Hip flexor stretch hooklying with RLE over edge of bed 30 sec 2 reps Quad stretch hooklying with RLE over edge of bed with strap knee flexion 30 sec hold 2 reps Supine quad set 5 sec hold 10 reps Supine SLR with quad set 10 reps Standing with RW support RLE TKE with red theraband 5 sec hold 10 reps  Self-Care: PT demo & verbal cues on scar mobs. Pt verbalized & return demo understanding.  Manual Therapy PROM with overpressure for  knee flexion seated & knee ext supine   10/14/2022:   Therex:         HEP instruction/performance c cues for techniques, handout provided.  Trial set performed of each for comprehension and symptom assessment.  See below for exercise list.  Additional time spent in encouragement for elevation >/= 2X/day for >/= 15 minutes.  Noted decrease in range for exercise based off symptoms.     PATIENT EDUCATION:  Education details: see HEP below Person educated: Patient Education method: Consulting civil engineer, Demonstration, Verbal cues, and Handouts Education comprehension: verbalized understanding, returned demonstration, verbal cues required, and needs further education     HOME EXERCISE PROGRAM: Access Code: [email or web address] URL:  [email or web address] Date: 10/14/2022 Prepared by: Jamey Reas   Exercises - Ankle Alphabet in Elevation  - 2-4 x daily - 7 x weekly - 1 sets - 1 reps - Quad Setting and Stretching  - 2-4 x daily - 7 x weekly - 5-10 sets - 10 reps - prop 5-10 minutes & quad set5 seconds hold - Supine Leg Press  - 2-4 x daily - 7 x weekly - 5-10 sets - 10 reps - 5 seconds hold - Supine Heel Slide with Strap  - 2-3 x daily - 7 x weekly - 2-3 sets - 10 reps - 5 seconds hold - Supine Straight Leg Raises  - 2-3 x daily - 7 x weekly - 2-3 sets - 10 reps - 5 seconds hold - Seated Knee Flexion Extension AROM   - 2-4 x daily - 7 x weekly - 2-3 sets - 10 reps - 5 seconds hold - Seated straight leg lifts  - 2-3 x daily - 7 x weekly - 2-3 sets - 10 reps - 5 seconds hold - Seated Long Arc Quad  - 2-3 x daily - 7 x weekly - 2-3 sets - 10 reps - 5 seconds hold - Seated Hamstring Stretch with Strap  - 2-4 x daily - 7 x weekly - 1 sets - 3 reps - 20-30 seconds hold   CLINICAL IMPRESSION: Patient is tolerating exercises for both flexion & extension seated & supine. Standing exercises are more difficult due to Left knee weakness and fear of left knee buckling while she is exercising RLE.  PT worked on muscle flexibility during session and she would benefit from addition to HEP next session.  Pt continues to benefit from skilled PT.    OBJECTIVE IMPAIRMENTS Abnormal gait, decreased activity tolerance, decreased balance, decreased endurance, decreased knowledge of condition, decreased knowledge of use of DME, decreased mobility, difficulty walking, decreased ROM, decreased strength, increased edema, impaired flexibility, postural dysfunction, and pain.    ACTIVITY LIMITATIONS carrying, lifting, bending, sitting, standing, squatting, sleeping, stairs, transfers, and locomotion level   PARTICIPATION LIMITATIONS: meal prep, cleaning, laundry, driving, community activity, and yard work   [place],  Past/current experiences, and 1-2 comorbidities: see PMH  are also affecting patient's functional outcome.    REHAB POTENTIAL: Good   CLINICAL DECISION MAKING: Stable/uncomplicated   EVALUATION COMPLEXITY: Low     GOALS: Goals reviewed with patient? Yes   SHORT TERM GOALS: Target date: 11/08/2022   Patient independent and verbalizes compliance with initial HEP Baseline: SEE OBJECTIVE DATA Goal status: INITIAL   2.  Patient reports 50% improvement in right knee pain. Baseline: SEE OBJECTIVE DATA Goal status: INITIAL   3.  PROM right knee extension -3* to flexion 105* Baseline: SEE OBJECTIVE DATA Goal status: INITIAL   LONG TERM GOALS: Target date: 12/06/2022  Patient will improve FOTO score to 54% Baseline: SEE OBJECTIVE DATA Goal status: INITIAL   2.  Patient reports right knee pain </= 2/10 with standing & gait activities.  Baseline: SEE OBJECTIVE DATA Goal status: INITIAL   3.  Right Knee PROM 0* extension to 115* flexion Baseline: SEE OBJECTIVE DATA Goal status: INITIAL   4.  Right Knee AROM seated -3* extension to 110* flexion Baseline: SEE OBJECTIVE DATA Goal status: INITIAL   5.  Patient ambulates >300' community distances including negotiating ramps, curbs & stairs with LRAD independently. Baseline: SEE OBJECTIVE DATA Goal status: INITIAL     PLAN: PT FREQUENCY: 2x/week   PT DURATION: 8 weeks   PLANNED INTERVENTIONS: Therapeutic exercises, Therapeutic activity, Neuromuscular re-education, Balance training, Gait training, Patient/Family education, Self Care, Joint mobilization, Stair training, DME instructions, Electrical stimulation, Cryotherapy, Moist heat, scar mobilization, Taping, Vasopneumatic device, Manual therapy, and physical performance testing   PLAN FOR NEXT SESSION: therapeutic exercise & manual therapy for ROM & strength, add muscle stretches to HEP.    Jamey Reas, PT, DPT 10/16/2022, 3:57 PM

## 2022-10-21 ENCOUNTER — Ambulatory Visit (INDEPENDENT_AMBULATORY_CARE_PROVIDER_SITE_OTHER): Payer: Medicare Other | Admitting: Physical Therapy

## 2022-10-21 DIAGNOSIS — R2689 Other abnormalities of gait and mobility: Secondary | ICD-10-CM

## 2022-10-21 DIAGNOSIS — R600 Localized edema: Secondary | ICD-10-CM

## 2022-10-21 DIAGNOSIS — R2681 Unsteadiness on feet: Secondary | ICD-10-CM

## 2022-10-21 DIAGNOSIS — M25661 Stiffness of right knee, not elsewhere classified: Secondary | ICD-10-CM

## 2022-10-21 DIAGNOSIS — M25561 Pain in right knee: Secondary | ICD-10-CM | POA: Diagnosis not present

## 2022-10-21 DIAGNOSIS — M6281 Muscle weakness (generalized): Secondary | ICD-10-CM

## 2022-10-21 NOTE — Therapy (Signed)
OUTPATIENT PHYSICAL THERAPY TREATMENT NOTE   Patient Name: Becky Murphy MRN: [id number] DOB:05/06/1951, 71 y.o., female Today's Date: 10/21/2022  PCP: Leonard Downing, MD REFERRING PROVIDER: Meredith Pel, MD  END OF SESSION:   PT End of Session - 10/21/22 0930     Visit Number 3    Number of Visits 16    Date for PT Re-Evaluation 12/06/22    Authorization Type Medicare    PT Start Time 0930    PT Stop Time 1012    PT Time Calculation (min) 42 min    Activity Tolerance Patient tolerated treatment well;Patient limited by pain    Behavior During Therapy [facility] for tasks assessed/performed              Past Medical History:  Diagnosis Date   Anemia    low iron in the past   Anxiety    pt denies   Arthritis    "back?" (11/03/2015) knees/hands (08/28/22)   Chest pain 11/02/2015   Depression    pt denies   Exertional dyspnea 01/05/2016   GERD (gastroesophageal reflux disease)    Heart murmur    History of peptic ulcer "late 1970's"   Migraine    "maybe 3-4/yr now" (11/03/2015)   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   Complex valvuloplasty including triangular resection of posterior leaflet, artificial Gore-tex neochord placement x6 and 34 mm Memo 3D ring annuloplasty via right mini thoracotomy approach with clipping of LA appendage   Thyroid nodule 01/29/2016   2.4 cm enhancing mixed cystic and solid nodule inferior left thyroid gland noted on CT angiogram   Past Surgical History:  Procedure Laterality Date   BACK SURGERY     BREAST BIOPSY Left ~ 2005   BREAST LUMPECTOMY  Left ~ 2005   CARDIAC CATHETERIZATION N/A 01/16/2016   Procedure: Right/Left Heart Cath and Coronary Angiography;  Surgeon: Sanda Klein, MD;  Location: [place] [facility];  Service: Cardiovascular;  Laterality: N/A;   CLIPPING OF ATRIAL APPENDAGE Left 03/06/2016   Procedure: CLIPPING OF LEFT ATRIAL APPENDAGE using a 75 PRO2 AtriClip;  Surgeon: Rexene Alberts, MD;  Location: [place];  Service: Open Heart Surgery;  Laterality: Left;   COLONOSCOPY     EUS N/A 02/20/2016   Procedure: ESOPHAGEAL ENDOSCOPIC ULTRASOUND (EUS) RADIAL;  Surgeon: Arta Silence, MD;  Location: [facility];  Service: Endoscopy;  Laterality: N/A;   EYE MUSCLE SURGERY Bilateral 1970's?   KNEE ARTHROSCOPY Right 04/04/2020   Procedure: RIGHT KNEE ARTHROSCOPY WITH DEBRIDEMENT;  Surgeon: Meredith Pel, MD;  Location: [place];  Service: Orthopedics;  Laterality: Right;   KNEE ARTHROSCOPY Right 04/10/2021   Procedure: right knee arthroscopy, debridement, placement of antibiotic beads;  Surgeon: Meredith Pel, MD;  Location: [place];  Service: Orthopedics;  Laterality: Right;   KNEE ARTHROSCOPY Left 12/10/2021   Procedure: LEFT KNEE ARTHROSCOPY, DEBRIDEMENT, PLACEMENT OF STIMULAN BEADS;  Surgeon: Meredith Pel, MD;  Location: [place];  Service: Orthopedics;  Laterality: Left;   [place]   "trimmed bulges off both sides"   MITRAL VALVE REPAIR Right  03/06/2016   Procedure: MINIMALLY INVASIVE MITRAL VALVE REPAIR (MVR) using a 34 Sorin Memo 3D Ring;  Surgeon: Purcell Nails, MD;  Location: [facility];  Service: Open Heart Surgery;  Laterality: Right;   TEE WITHOUT CARDIOVERSION N/A 01/05/2016   Procedure: TRANSESOPHAGEAL ECHOCARDIOGRAM (TEE);  Surgeon: Thurmon Fair, MD;  Location: [facility];  Service: Cardiovascular;  Laterality: N/A;   TEE WITHOUT CARDIOVERSION N/A 03/06/2016   Procedure: TRANSESOPHAGEAL ECHOCARDIOGRAM (TEE);  Surgeon: Purcell Nails, MD;  Location: [facility];  Service: Open Heart Surgery;   Laterality: N/A;   TOTAL KNEE ARTHROPLASTY Right 09/03/2022   Procedure: RIGHT TOTAL KNEE ARTHROPLASTY-CEMENTED;  Surgeon: Cammy Copa, MD;  Location: [facility] OR;  Service: Orthopedics;  Laterality: Right;   TUBAL LIGATION     Patient Active Problem List   Diagnosis Date Noted   Arthritis of knee    Status post right knee replacement 09/03/2022   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   High risk medication use 04/17/2022   Vitamin B 12 deficiency 04/03/2022   Iron deficiency anemia 03/29/2022   Bilateral wrist pain 02/27/2022   Hemarthrosis 02/08/2022   Synovitis of left knee 07/19/2021   Vancomycin adverse reaction 04/19/2021   Therapeutic drug monitoring 04/19/2021   Synovitis of right knee 04/04/2020   Long term (current) use of anticoagulants [Z79.01] 03/14/2016   S/P minimally invasive mitral valve repair 03/06/2016   Pancreatic mass 01/29/2016   Thyroid nodule 01/29/2016   Exertional dyspnea 01/05/2016   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   Mitral insufficiency    Mitral valve prolapse 12/26/2015   Anxiety 11/02/2015    REFERRING DIAG: Z96.651 (ICD-10-CM) - Status post total right knee replacement  ONSET DATE: 09/03/2022  THERAPY DIAG:  Acute pain of right knee  Stiffness of right knee, not elsewhere classified  Muscle weakness (generalized)  Other abnormalities of gait and mobility  Unsteadiness on feet  Localized edema  Rationale for Evaluation and Treatment Rehabilitation  PERTINENT HISTORY: Right TKA 09/03/22, Left Knee Arthroscopy 12/10/21, Seronegative Inflammatory Arthritis, MVP with repair, anxiety, pancreatic mass, back sg, TEE cardioversion  PRECAUTIONS: Fall  SUBJECTIVE:                                                                                                                                                                                      SUBJECTIVE STATEMENT:  She has been doing her exercises and working on  walking as PT advised.  PAIN:  Are you having pain? Yes: NPRS scale: RLE 3/10 LLE 5/10 today & since last PT lowest 1/10 highest 5/10 Pain location: both knees (right sides & back;  left on sides) Pain description: throbbing  Aggravating factors: exercising like lifting leg for RLE & walking for LLE Relieving factors: aspirin at night, ice, Icy-Hot cream at night  OBJECTIVE: (objective measures completed at initial evaluation unless otherwise dated)    DIAGNOSTIC FINDINGS: 09/18/22   AP lateral radiographs right knee reviewed.  Right cemented total knee replacement in good position and alignment with no complicating features.Marland Kitchen   PATIENT SURVEYS:  10/14/2022:   FOTO 40% with target 54%   COGNITION: 10/14/2022:   Overall cognitive status: Within functional limits for tasks assessed                 EDEMA:  10/14/2022:   RLE:  above knee 40.5cm  around knee  39.5cm  below knee  32.5cm  10/14/2022:   LLE:  above knee 37cm  around knee  37cm below knee 29cm   PALPATION: Right Knee Tenderness at patella tendon, hamstring tendons & medial /lateral joint line.  Denies tenderness at quad tendon, quadriceps or hamstring muscle bellies.   LOWER EXTREMITY ROM:   ROM P:passive  A:active Right eval Left eval Right 10/21/22  Hip flexion       Hip extension       Hip abduction       Hip adduction       Hip internal rotation       Hip external rotation       Knee flexion Seated P: 104* Seated P: 116* Supine A: 128*  Knee extension Seated P: -17* A: -26* Seated P: -6* A: -24* Seated  LAQ A: -19* Supine Quad set  A: -4*  Ankle dorsiflexion       Ankle plantarflexion       Ankle inversion       Ankle eversion        (Blank rows = not tested)   LOWER EXTREMITY MMT:   MMT Right eval Left eval  Hip flexion      Hip extension      Hip abduction      Hip adduction      Hip internal rotation      Hip external rotation      Knee flexion 3-/5 3-/5  Knee extension 3-/5 3-/5   Ankle dorsiflexion      Ankle plantarflexion      Ankle inversion      Ankle eversion       (Blank rows = not tested)     GAIT: 10/14/2022:   Distance walked: 100' Assistive device utilized: Environmental consultant - 2 wheeled Level of assistance: SBA Comments: BLE flexed knees in stance with trunk flexion, decreased step length BLEs, step-to pattern leading with LLE, excessive weight bearing on RW       TODAY'S TREATMENT: 10/21/2022 Therapeutic Exercise: SciFit bike seat 8 level 1 with BLEs/BUEs for 6 min then LEs only for 2 min Seated LAQ RLE 3# & active knee flexion with contralateral LE opposing motion for 5 sec hold 10 reps 2 sets Gastroc stretch on step heel depression 30 sec hold 2 reps Heel raises 10 reps with BUE support RW Standing terminal knee ext RLE red theraband 10 reps and 2nd set 10 reps RLE SLS with RW support. PT tactile & verbal cues.  Hamstring stretch with RLE ext on bed with strap DF 30 sec hold 2 reps Supine quad set 5 sec hold 10 reps Supine SLR with quad set 10 reps  Neuromuscular Re-ed Tandem stance on foam beam 30 sec ea LE in front 2 reps intermittent touch  Manual Therapy PROM with overpressure for knee flexion seated & knee ext supine  10/16/2022: Therapeutic Exercise: NuStep seat 8 with BLEs/BUEs level 5 for 6 min then LEs only for 2 min Seated LAQ & active knee flexion with contralateral LE opposing motion for 5 sec hold 10 reps 2 sets Gastroc stretch on incline board 30 sec hold 2 reps Heel raises on incline board 10 reps with BUE support Hamstring stretch with RLE ext on bed with strap DF 30 sec hold 2 reps Hip flexor stretch hooklying with RLE over edge of bed 30 sec 2 reps Quad stretch hooklying with RLE over edge of bed with strap knee flexion 30 sec hold 2 reps Supine quad set 5 sec hold 10 reps Supine SLR with quad set 10 reps Standing with RW support RLE TKE with red theraband 5 sec hold 10 reps  Self-Care: PT demo & verbal cues on scar mobs.  Pt verbalized & return demo understanding.  Manual Therapy PROM with overpressure for knee flexion seated & knee ext supine   10/14/2022:   Therex:         HEP instruction/performance c cues for techniques, handout provided.  Trial set performed of each for comprehension and symptom assessment.  See below for exercise list.  Additional time spent in encouragement for elevation >/= 2X/day for >/= 15 minutes.  Noted decrease in range for exercise based off symptoms.     PATIENT EDUCATION:  Education details: see HEP below Person educated: Patient Education method: Programmer, multimedia, Demonstration, Verbal cues, and Handouts Education comprehension: verbalized understanding, returned demonstration, verbal cues required, and needs further education     HOME EXERCISE PROGRAM: Access Code: [email or web address] URL: [email or web address] Date: 10/14/2022 Prepared by: Vladimir Faster   Exercises - Ankle Alphabet in Elevation  - 2-4 x daily - 7 x weekly - 1 sets - 1 reps - Quad Setting and Stretching  - 2-4 x daily - 7 x weekly - 5-10 sets - 10 reps - prop 5-10 minutes & quad set5 seconds hold - Supine Leg Press  - 2-4 x daily - 7 x weekly - 5-10 sets - 10 reps - 5 seconds hold - Supine Heel Slide with Strap  - 2-3 x daily - 7 x weekly - 2-3 sets - 10 reps - 5 seconds hold - Supine Straight Leg Raises  - 2-3 x daily - 7 x weekly - 2-3 sets - 10 reps - 5 seconds hold - Seated Knee Flexion Extension AROM   - 2-4 x daily - 7 x weekly - 2-3 sets - 10 reps - 5 seconds hold - Seated straight leg lifts  - 2-3 x daily - 7 x weekly - 2-3 sets - 10 reps - 5 seconds hold - Seated Long Arc Quad  - 2-3 x daily - 7 x weekly - 2-3 sets - 10 reps - 5 seconds hold - Seated Hamstring Stretch with Strap  - 2-4 x daily - 7 x weekly - 1 sets - 3 reps - 20-30 seconds hold   CLINICAL IMPRESSION: Pt's active range is improving. PT progressed exercises to include more standing for function.    Pt continues to benefit  from skilled PT.    OBJECTIVE IMPAIRMENTS Abnormal gait, decreased activity tolerance, decreased balance, decreased endurance, decreased knowledge of condition, decreased knowledge of use of DME, decreased mobility, difficulty walking, decreased ROM, decreased strength, increased edema, impaired flexibility, postural dysfunction, and pain.    ACTIVITY LIMITATIONS carrying, lifting, bending, sitting, standing, squatting, sleeping,  stairs, transfers, and locomotion level   PARTICIPATION LIMITATIONS: meal prep, cleaning, laundry, driving, community activity, and yard work   PERSONAL FACTORS Fitness, Past/current experiences, and 1-2 comorbidities: see PMH  are also affecting patient's functional outcome.    REHAB POTENTIAL: Good   CLINICAL DECISION MAKING: Stable/uncomplicated   EVALUATION COMPLEXITY: Low     GOALS: Goals reviewed with patient? Yes   SHORT TERM GOALS: Target date: 11/08/2022   Patient independent and verbalizes compliance with initial HEP Baseline: SEE OBJECTIVE DATA Goal status: INITIAL   2.  Patient reports 50% improvement in right knee pain. Baseline: SEE OBJECTIVE DATA Goal status: INITIAL   3.  PROM right knee extension -3* to flexion 105* Baseline: SEE OBJECTIVE DATA Goal status: INITIAL   LONG TERM GOALS: Target date: 12/06/2022    Patient will improve FOTO score to 54% Baseline: SEE OBJECTIVE DATA Goal status: INITIAL   2.  Patient reports right knee pain </= 2/10 with standing & gait activities.  Baseline: SEE OBJECTIVE DATA Goal status: INITIAL   3.  Right Knee PROM 0* extension to 115* flexion Baseline: SEE OBJECTIVE DATA Goal status: INITIAL   4.  Right Knee AROM seated -3* extension to 110* flexion Baseline: SEE OBJECTIVE DATA Goal status: INITIAL   5.  Patient ambulates >300' community distances including negotiating ramps, curbs & stairs with LRAD independently. Baseline: SEE OBJECTIVE DATA Goal status: INITIAL     PLAN: PT  FREQUENCY: 2x/week   PT DURATION: 8 weeks   PLANNED INTERVENTIONS: Therapeutic exercises, Therapeutic activity, Neuromuscular re-education, Balance training, Gait training, Patient/Family education, Self Care, Joint mobilization, Stair training, DME instructions, Electrical stimulation, Cryotherapy, Moist heat, scar mobilization, Taping, Vasopneumatic device, Manual therapy, and physical performance testing   PLAN FOR NEXT SESSION: continue with therapeutic exercise & manual therapy for ROM & strength, add muscle stretches to HEP.    Vladimir Fasterobin Abdulah Iqbal, PT, DPT 10/21/2022, 10:13 AM

## 2022-10-23 ENCOUNTER — Ambulatory Visit (INDEPENDENT_AMBULATORY_CARE_PROVIDER_SITE_OTHER): Payer: Medicare Other | Admitting: Physical Therapy

## 2022-10-23 ENCOUNTER — Encounter: Payer: Self-pay | Admitting: Physical Therapy

## 2022-10-23 DIAGNOSIS — M25561 Pain in right knee: Secondary | ICD-10-CM | POA: Diagnosis not present

## 2022-10-23 DIAGNOSIS — M25661 Stiffness of right knee, not elsewhere classified: Secondary | ICD-10-CM | POA: Diagnosis not present

## 2022-10-23 DIAGNOSIS — R2689 Other abnormalities of gait and mobility: Secondary | ICD-10-CM | POA: Diagnosis not present

## 2022-10-23 DIAGNOSIS — M6281 Muscle weakness (generalized): Secondary | ICD-10-CM

## 2022-10-23 DIAGNOSIS — R600 Localized edema: Secondary | ICD-10-CM

## 2022-10-23 DIAGNOSIS — R2681 Unsteadiness on feet: Secondary | ICD-10-CM

## 2022-10-23 NOTE — Therapy (Signed)
OUTPATIENT PHYSICAL THERAPY TREATMENT NOTE   Patient Name: Becky Murphy MRN: [id number] DOB:02-23-1951, 71 y.o., female Today's Date: 10/23/2022  PCP: Leonard Downing, MD REFERRING PROVIDER: Meredith Pel, MD  END OF SESSION:   PT End of Session - 10/23/22 0934     Visit Number 4    Number of Visits 16    Date for PT Re-Evaluation 12/06/22    Authorization Type Medicare    PT Start Time 0930    PT Stop Time 1008    PT Time Calculation (min) 38 min    Activity Tolerance Patient tolerated treatment well;Patient limited by pain    Behavior During Therapy [facility] for tasks assessed/performed               Past Medical History:  Diagnosis Date   Anemia    low iron in the past   Anxiety    pt denies   Arthritis    "back?" (11/03/2015) knees/hands (08/28/22)   Chest pain 11/02/2015   Depression    pt denies   Exertional dyspnea 01/05/2016   GERD (gastroesophageal reflux disease)    Heart murmur    History of peptic ulcer "late 1970's"   Migraine    "maybe 3-4/yr now" (11/03/2015)   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   Complex valvuloplasty including triangular resection of posterior leaflet, artificial Gore-tex neochord placement x6 and 34 mm Memo 3D ring annuloplasty via right mini thoracotomy approach with clipping of LA appendage   Thyroid nodule 01/29/2016   2.4 cm enhancing mixed cystic and solid nodule inferior left thyroid gland noted on CT angiogram   Past Surgical History:  Procedure Laterality Date   BACK SURGERY     BREAST BIOPSY Left ~ 2005   BREAST  LUMPECTOMY Left ~ 2005   CARDIAC CATHETERIZATION N/A 01/16/2016   Procedure: Right/Left Heart Cath and Coronary Angiography;  Surgeon: Sanda Klein, MD;  Location: [place] [facility];  Service: Cardiovascular;  Laterality: N/A;   CLIPPING OF ATRIAL APPENDAGE Left 03/06/2016   Procedure: CLIPPING OF LEFT ATRIAL APPENDAGE using a 77 PRO2 AtriClip;  Surgeon: Rexene Alberts, MD;  Location: [place];  Service: Open Heart Surgery;  Laterality: Left;   COLONOSCOPY     EUS N/A 02/20/2016   Procedure: ESOPHAGEAL ENDOSCOPIC ULTRASOUND (EUS) RADIAL;  Surgeon: Arta Silence, MD;  Location: [facility];  Service: Endoscopy;  Laterality: N/A;   EYE MUSCLE SURGERY Bilateral 1970's?   KNEE ARTHROSCOPY Right 04/04/2020   Procedure: RIGHT KNEE ARTHROSCOPY WITH DEBRIDEMENT;  Surgeon: Meredith Pel, MD;  Location: [place];  Service: Orthopedics;  Laterality: Right;   KNEE ARTHROSCOPY Right 04/10/2021   Procedure: right knee arthroscopy, debridement, placement of antibiotic beads;  Surgeon: Meredith Pel, MD;  Location: [place];  Service: Orthopedics;  Laterality: Right;   KNEE ARTHROSCOPY Left 12/10/2021   Procedure: LEFT KNEE ARTHROSCOPY, DEBRIDEMENT, PLACEMENT OF STIMULAN BEADS;  Surgeon: Meredith Pel, MD;  Location: [place];  Service: Orthopedics;  Laterality: Left;   [place]   "trimmed bulges off both sides"   MITRAL VALVE REPAIR  Right 03/06/2016   Procedure: MINIMALLY INVASIVE MITRAL VALVE REPAIR (MVR) using a 34 Sorin Memo 3D Ring;  Surgeon: Purcell Nailslarence H Owen, MD;  Location: [facility];  Service: Open Heart Surgery;  Laterality: Right;   TEE WITHOUT CARDIOVERSION N/A 01/05/2016   Procedure: TRANSESOPHAGEAL ECHOCARDIOGRAM (TEE);  Surgeon: Thurmon FairMihai Croitoru, MD;  [facility];  Service: Cardiovascular;  Laterality: N/A;   TEE WITHOUT CARDIOVERSION N/A 03/06/2016   Procedure: TRANSESOPHAGEAL ECHOCARDIOGRAM (TEE);  Surgeon: Purcell Nailslarence H Owen, MD;  [facility];  Service: Open Heart Surgery;   Laterality: N/A;   TOTAL KNEE ARTHROPLASTY Right 09/03/2022   Procedure: RIGHT TOTAL KNEE ARTHROPLASTY-CEMENTED;  Surgeon: Cammy Copaean, Gregory Scott, MD;  [facility];  Service: Orthopedics;  Laterality: Right;   TUBAL LIGATION     Patient Active Problem List   Diagnosis Date Noted   Arthritis of knee    Status post right knee replacement 09/03/2022   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   High risk medication use 04/17/2022   Vitamin B 12 deficiency 04/03/2022   Iron deficiency anemia 03/29/2022   Bilateral wrist pain 02/27/2022   Hemarthrosis 02/08/2022   Synovitis of left knee 07/19/2021   Vancomycin adverse reaction 04/19/2021   Therapeutic drug monitoring 04/19/2021   Synovitis of right knee 04/04/2020   Long term (current) use of anticoagulants [Z79.01] 03/14/2016   S/P minimally invasive mitral valve repair 03/06/2016   Pancreatic mass 01/29/2016   Thyroid nodule 01/29/2016   Exertional dyspnea 01/05/2016   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   Mitral insufficiency    Mitral valve prolapse 12/26/2015   Anxiety 11/02/2015    REFERRING DIAG: Z96.651 (ICD-10-CM) - Status post total right knee replacement  ONSET DATE: 09/03/2022  THERAPY DIAG:  Acute pain of right knee  Stiffness of right knee, not elsewhere classified  Muscle weakness (generalized)  Other abnormalities of gait and mobility  Unsteadiness on feet  Localized edema  Rationale for Evaluation and Treatment Rehabilitation  PERTINENT HISTORY: Right TKA 09/03/22, Left Knee Arthroscopy 12/10/21, Seronegative Inflammatory Arthritis, MVP with repair, anxiety, pancreatic mass, back sg, TEE cardioversion  PRECAUTIONS: Fall  SUBJECTIVE:                                                                                                                                                                                      SUBJECTIVE STATEMENT:  Lt knee (non-op) is giving her trouble today.    PAIN:  Are you having pain? Yes: NPRS scale: RLE 2-3/10 LLE 5/10 today & since last PT lowest 1/10 highest 5/10 Pain location: both knees (right sides & back;  left on sides) Pain description: throbbing Aggravating factors: exercising  like lifting leg for RLE & walking for LLE Relieving factors: aspirin at night, ice, Icy-Hot cream at night  OBJECTIVE: (objective measures completed at initial evaluation unless otherwise dated)   PATIENT SURVEYS:  10/14/2022:   FOTO 40% with target 54%   COGNITION: 10/14/2022:   Overall cognitive status: Within functional limits for tasks assessed                 EDEMA:  10/14/2022:   RLE:  above knee 40.5cm  around knee  39.5cm  below knee  32.5cm  10/14/2022:   LLE:  above knee 37cm  around knee  37cm below knee 29cm   PALPATION: Right Knee Tenderness at patella tendon, hamstring tendons & medial /lateral joint line.  Denies tenderness at quad tendon, quadriceps or hamstring muscle bellies.   LOWER EXTREMITY ROM:   ROM P:passive  A:active Right eval Left eval Right 10/21/22  Hip flexion       Hip extension       Hip abduction       Hip adduction       Hip internal rotation       Hip external rotation       Knee flexion Seated P: 104* Seated P: 116* Supine A: 128*  Knee extension Seated P: -17* A: -26* Seated P: -6* A: -24* Seated  LAQ A: -19* Supine Quad set  A: -4*  Ankle dorsiflexion       Ankle plantarflexion       Ankle inversion       Ankle eversion        (Blank rows = not tested)   LOWER EXTREMITY MMT:   MMT Right eval Left eval  Hip flexion      Hip extension      Hip abduction      Hip adduction      Hip internal rotation      Hip external rotation      Knee flexion 3-/5 3-/5  Knee extension 3-/5 3-/5  Ankle dorsiflexion      Ankle plantarflexion      Ankle inversion      Ankle eversion       (Blank rows = not tested)     GAIT: 10/14/2022:   Distance walked: 100' Assistive device utilized:  Environmental consultant - 2 wheeled Level of assistance: SBA Comments: BLE flexed knees in stance with trunk flexion, decreased step length BLEs, step-to pattern leading with LLE, excessive weight bearing on RW       TODAY'S TREATMENT: 10/23/22 Therapeutic Exercise: SciFit bike seat 8 level 1 LEs only x 8 min LAQ 2x10 with 4#; performed bil Sit to/from stand x 10 reps without UE support; elevated mat table Seated SLR 2x5; 5 sec hold bil Standing calf raises x 20 reps Squats 2x10 (small range) Seated hamstring stretch with overpressure at distal thigh 3x30 sec bil   10/21/2022 Therapeutic Exercise: SciFit bike seat 8 level 1 with BLEs/BUEs for 6 min then LEs only for 2 min Seated LAQ RLE 3# & active knee flexion with contralateral LE opposing motion for 5 sec hold 10 reps 2 sets Gastroc stretch on step heel depression 30 sec hold 2 reps Heel raises 10 reps with BUE support RW Standing terminal knee ext RLE red theraband 10 reps and 2nd set 10 reps RLE SLS with RW support. PT tactile & verbal cues.  Hamstring stretch with RLE ext on bed with strap DF 30 sec hold 2 reps Supine quad set  5 sec hold 10 reps Supine SLR with quad set 10 reps  Neuromuscular Re-ed Tandem stance on foam beam 30 sec ea LE in front 2 reps intermittent touch  Manual Therapy PROM with overpressure for knee flexion seated & knee ext supine  10/16/2022: Therapeutic Exercise: NuStep seat 8 with BLEs/BUEs level 5 for 6 min then LEs only for 2 min Seated LAQ & active knee flexion with contralateral LE opposing motion for 5 sec hold 10 reps 2 sets Gastroc stretch on incline board 30 sec hold 2 reps Heel raises on incline board 10 reps with BUE support Hamstring stretch with RLE ext on bed with strap DF 30 sec hold 2 reps Hip flexor stretch hooklying with RLE over edge of bed 30 sec 2 reps Quad stretch hooklying with RLE over edge of bed with strap knee flexion 30 sec hold 2 reps Supine quad set 5 sec hold 10 reps Supine SLR  with quad set 10 reps Standing with RW support RLE TKE with red theraband 5 sec hold 10 reps  Self-Care: PT demo & verbal cues on scar mobs. Pt verbalized & return demo understanding.  Manual Therapy PROM with overpressure for knee flexion seated & knee ext supine   10/14/2022:   Therex:         HEP instruction/performance c cues for techniques, handout provided.  Trial set performed of each for comprehension and symptom assessment.  See below for exercise list.  Additional time spent in encouragement for elevation >/= 2X/day for >/= 15 minutes.  Noted decrease in range for exercise based off symptoms.     PATIENT EDUCATION:  Education details: see HEP below Person educated: Patient Education method: Programmer, multimedia, Demonstration, Verbal cues, and Handouts Education comprehension: verbalized understanding, returned demonstration, verbal cues required, and needs further education     HOME EXERCISE PROGRAM: Access Code: [email or web address] URL: [email or web address] Date: 10/14/2022 Prepared by: Vladimir Faster   Exercises - Ankle Alphabet in Elevation  - 2-4 x daily - 7 x weekly - 1 sets - 1 reps - Quad Setting and Stretching  - 2-4 x daily - 7 x weekly - 5-10 sets - 10 reps - prop 5-10 minutes & quad set5 seconds hold - Supine Leg Press  - 2-4 x daily - 7 x weekly - 5-10 sets - 10 reps - 5 seconds hold - Supine Heel Slide with Strap  - 2-3 x daily - 7 x weekly - 2-3 sets - 10 reps - 5 seconds hold - Supine Straight Leg Raises  - 2-3 x daily - 7 x weekly - 2-3 sets - 10 reps - 5 seconds hold - Seated Knee Flexion Extension AROM   - 2-4 x daily - 7 x weekly - 2-3 sets - 10 reps - 5 seconds hold - Seated straight leg lifts  - 2-3 x daily - 7 x weekly - 2-3 sets - 10 reps - 5 seconds hold - Seated Long Arc Quad  - 2-3 x daily - 7 x weekly - 2-3 sets - 10 reps - 5 seconds hold - Seated Hamstring Stretch with Strap  - 2-4 x daily - 7 x weekly - 1 sets - 3 reps - 20-30 seconds  hold   CLINICAL IMPRESSION: Pt tolerated session well today with progression of standing and strengthening exercises.  Will continue to benefit from PT to maximize function.   OBJECTIVE IMPAIRMENTS Abnormal gait, decreased activity tolerance, decreased balance, decreased endurance, decreased knowledge of condition, decreased knowledge of use of DME,  decreased mobility, difficulty walking, decreased ROM, decreased strength, increased edema, impaired flexibility, postural dysfunction, and pain.    ACTIVITY LIMITATIONS carrying, lifting, bending, sitting, standing, squatting, sleeping, stairs, transfers, and locomotion level   PARTICIPATION LIMITATIONS: meal prep, cleaning, laundry, driving, community activity, and yard work   PERSONAL FACTORS Fitness, Past/current experiences, and 1-2 comorbidities: see PMH  are also affecting patient's functional outcome.    REHAB POTENTIAL: Good   CLINICAL DECISION MAKING: Stable/uncomplicated   EVALUATION COMPLEXITY: Low     GOALS: Goals reviewed with patient? Yes   SHORT TERM GOALS: Target date: 11/08/2022   Patient independent and verbalizes compliance with initial HEP Baseline: SEE OBJECTIVE DATA Goal status: INITIAL   2.  Patient reports 50% improvement in right knee pain. Baseline: SEE OBJECTIVE DATA Goal status: INITIAL   3.  PROM right knee extension -3* to flexion 105* Baseline: SEE OBJECTIVE DATA Goal status: INITIAL   LONG TERM GOALS: Target date: 12/06/2022    Patient will improve FOTO score to 54% Baseline: SEE OBJECTIVE DATA Goal status: INITIAL   2.  Patient reports right knee pain </= 2/10 with standing & gait activities.  Baseline: SEE OBJECTIVE DATA Goal status: INITIAL   3.  Right Knee PROM 0* extension to 115* flexion Baseline: SEE OBJECTIVE DATA Goal status: INITIAL   4.  Right Knee AROM seated -3* extension to 110* flexion Baseline: SEE OBJECTIVE DATA Goal status: INITIAL   5.  Patient ambulates >300'  community distances including negotiating ramps, curbs & stairs with LRAD independently. Baseline: SEE OBJECTIVE DATA Goal status: INITIAL     PLAN: PT FREQUENCY: 2x/week   PT DURATION: 8 weeks   PLANNED INTERVENTIONS: Therapeutic exercises, Therapeutic activity, Neuromuscular re-education, Balance training, Gait training, Patient/Family education, Self Care, Joint mobilization, Stair training, DME instructions, Electrical stimulation, Cryotherapy, Moist heat, scar mobilization, Taping, Vasopneumatic device, Manual therapy, and physical performance testing   PLAN FOR NEXT SESSION: leg press, measure, continue with therapeutic exercise & manual therapy for ROM & strength, add muscle stretches to HEP.    Moshe Cipro, PT, DPT 10/23/2022, 10:09 AM

## 2022-10-25 ENCOUNTER — Other Ambulatory Visit: Payer: Self-pay | Admitting: Surgical

## 2022-10-28 ENCOUNTER — Encounter: Payer: Medicare Other | Admitting: Physical Therapy

## 2022-10-29 ENCOUNTER — Encounter: Payer: Self-pay | Admitting: Physical Therapy

## 2022-10-29 ENCOUNTER — Ambulatory Visit (INDEPENDENT_AMBULATORY_CARE_PROVIDER_SITE_OTHER): Payer: Medicare Other | Admitting: Physical Therapy

## 2022-10-29 DIAGNOSIS — M25661 Stiffness of right knee, not elsewhere classified: Secondary | ICD-10-CM | POA: Diagnosis not present

## 2022-10-29 DIAGNOSIS — R600 Localized edema: Secondary | ICD-10-CM

## 2022-10-29 DIAGNOSIS — M25561 Pain in right knee: Secondary | ICD-10-CM

## 2022-10-29 DIAGNOSIS — R2681 Unsteadiness on feet: Secondary | ICD-10-CM

## 2022-10-29 DIAGNOSIS — R2689 Other abnormalities of gait and mobility: Secondary | ICD-10-CM

## 2022-10-29 DIAGNOSIS — M6281 Muscle weakness (generalized): Secondary | ICD-10-CM | POA: Diagnosis not present

## 2022-10-29 NOTE — Therapy (Signed)
OUTPATIENT PHYSICAL THERAPY TREATMENT NOTE   Patient Name: Becky Murphy MRN: [id number] DOB:01/10/1951, 71 y.o., female Today's Date: 10/29/2022  PCP: Kaleen Mask, MD REFERRING PROVIDER: Cammy Copa, MD  END OF SESSION:   PT End of Session - 10/29/22 0934     Visit Number 5    Number of Visits 16    Date for PT Re-Evaluation 12/06/22    Authorization Type Medicare    PT Start Time 0930    PT Stop Time 1015    PT Time Calculation (min) 45 min    Activity Tolerance Patient tolerated treatment well;Patient limited by pain    Behavior During Therapy [facility] for tasks assessed/performed                Past Medical History:  Diagnosis Date   Anemia    low iron in the past   Anxiety    pt denies   Arthritis    "back?" (11/03/2015) knees/hands (08/28/22)   Chest pain 11/02/2015   Depression    pt denies   Exertional dyspnea 01/05/2016   GERD (gastroesophageal reflux disease)    Heart murmur    History of peptic ulcer "late 1970's"   Migraine    "maybe 3-4/yr now" (11/03/2015)   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   Complex valvuloplasty including triangular resection of posterior leaflet, artificial Gore-tex neochord placement x6 and 34 mm Memo 3D ring annuloplasty via right mini thoracotomy approach with clipping of LA appendage   Thyroid nodule 01/29/2016   2.4 cm enhancing mixed cystic and solid nodule inferior left thyroid gland noted on CT angiogram   Past Surgical History:  Procedure Laterality Date   BACK SURGERY     BREAST BIOPSY Left ~ 2005   BREAST  LUMPECTOMY Left ~ 2005   CARDIAC CATHETERIZATION N/A 01/16/2016   Procedure: Right/Left Heart Cath and Coronary Angiography;  Surgeon: Thurmon Fair, MD;  Location: [facility];  Service: Cardiovascular;  Laterality: N/A;   CLIPPING OF ATRIAL APPENDAGE Left 03/06/2016   Procedure: CLIPPING OF LEFT ATRIAL APPENDAGE using a 45 PRO2 AtriClip;  Surgeon: Purcell Nails, MD;  Location: [facility] OR;  Service: Open Heart Surgery;  Laterality: Left;   COLONOSCOPY     EUS N/A 02/20/2016   Procedure: ESOPHAGEAL ENDOSCOPIC ULTRASOUND (EUS) RADIAL;  Surgeon: Willis Modena, MD;  Location: [facility] ENDOSCOPY;  Service: Endoscopy;  Laterality: N/A;   EYE MUSCLE SURGERY Bilateral 1970's?   KNEE ARTHROSCOPY Right 04/04/2020   Procedure: RIGHT KNEE ARTHROSCOPY WITH DEBRIDEMENT;  Surgeon: Cammy Copa, MD;  Location: [facility] OR;  Service: Orthopedics;  Laterality: Right;   KNEE ARTHROSCOPY Right 04/10/2021   Procedure: right knee arthroscopy, debridement, placement of antibiotic beads;  Surgeon: Cammy Copa, MD;  Location: [facility] OR;  Service: Orthopedics;  Laterality: Right;   KNEE ARTHROSCOPY Left 12/10/2021   Procedure: LEFT KNEE ARTHROSCOPY, DEBRIDEMENT, PLACEMENT OF STIMULAN BEADS;  Surgeon: Cammy Copa, MD;  Location: [facility] OR;  Service: Orthopedics;  Laterality: Left;   LUMBAR DISC SURGERY  1992   "trimmed bulges off both sides"   MITRAL VALVE  REPAIR Right 03/06/2016   Procedure: MINIMALLY INVASIVE MITRAL VALVE REPAIR (MVR) using a 34 Sorin Memo 3D Ring;  Surgeon: Purcell Nails, MD;  Location: [facility] OR;  Service: Open Heart Surgery;  Laterality: Right;   TEE WITHOUT CARDIOVERSION N/A 01/05/2016   Procedure: TRANSESOPHAGEAL ECHOCARDIOGRAM (TEE);  Surgeon: Thurmon Fair, MD;  Location: [facility] ENDOSCOPY;  Service: Cardiovascular;  Laterality: N/A;   TEE WITHOUT CARDIOVERSION N/A 03/06/2016   Procedure: TRANSESOPHAGEAL ECHOCARDIOGRAM (TEE);  Surgeon: Purcell Nails, MD;  Location: [facility] OR;  Service: Open Heart Surgery;   Laterality: N/A;   TOTAL KNEE ARTHROPLASTY Right 09/03/2022   Procedure: RIGHT TOTAL KNEE ARTHROPLASTY-CEMENTED;  Surgeon: Cammy Copa, MD;  Location: [facility] OR;  Service: Orthopedics;  Laterality: Right;   TUBAL LIGATION     Patient Active Problem List   Diagnosis Date Noted   Arthritis of knee    Status post right knee replacement 09/03/2022   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   High risk medication use 04/17/2022   Vitamin B 12 deficiency 04/03/2022   Iron deficiency anemia 03/29/2022   Bilateral wrist pain 02/27/2022   Hemarthrosis 02/08/2022   Synovitis of left knee 07/19/2021   Vancomycin adverse reaction 04/19/2021   Therapeutic drug monitoring 04/19/2021   Synovitis of right knee 04/04/2020   Long term (current) use of anticoagulants [Z79.01] 03/14/2016   S/P minimally invasive mitral valve repair 03/06/2016   Pancreatic mass 01/29/2016   Thyroid nodule 01/29/2016   Exertional dyspnea 01/05/2016   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   Mitral insufficiency    Mitral valve prolapse 12/26/2015   Anxiety 11/02/2015    REFERRING DIAG: Z96.651 (ICD-10-CM) - Status post total right knee replacement  ONSET DATE: 09/03/2022  THERAPY DIAG:  Acute pain of right knee  Stiffness of right knee, not elsewhere classified  Muscle weakness (generalized)  Other abnormalities of gait and mobility  Unsteadiness on feet  Localized edema  Rationale for Evaluation and Treatment Rehabilitation  PERTINENT HISTORY: Right TKA 09/03/22, Left Knee Arthroscopy 12/10/21, Seronegative Inflammatory Arthritis, MVP with repair, anxiety, pancreatic mass, back sg, TEE cardioversion  PRECAUTIONS: Fall  SUBJECTIVE:                                                                                                                                                                                      SUBJECTIVE STATEMENT:  doing well, no complaints today   PAIN:  Are you  having pain? Yes: NPRS scale: RLE 2-3/10 LLE 5/10 today & since last PT lowest 1/10 highest 5/10 Pain location: both knees (right sides & back;  left on sides) Pain description: throbbing Aggravating factors: exercising like lifting  leg for RLE & walking for LLE Relieving factors: aspirin at night, ice, Icy-Hot cream at night  OBJECTIVE: (objective measures completed at initial evaluation unless otherwise dated)   PATIENT SURVEYS:  10/14/2022:   FOTO 40% with target 54%   COGNITION: 10/14/2022:   Overall cognitive status: Within functional limits for tasks assessed                 EDEMA:  10/14/2022:   RLE:  above knee 40.5cm  around knee  39.5cm  below knee  32.5cm  10/14/2022:   LLE:  above knee 37cm  around knee  37cm below knee 29cm   PALPATION: Right Knee Tenderness at patella tendon, hamstring tendons & medial /lateral joint line.  Denies tenderness at quad tendon, quadriceps or hamstring muscle bellies.   LOWER EXTREMITY ROM:   ROM P:passive  A:active Right eval Left eval Right 10/21/22 Right 10/29/22  Hip flexion        Hip extension        Hip abduction        Hip adduction        Hip internal rotation        Hip external rotation        Knee flexion Seated P: 104* Seated P: 116* Supine A: 128* A: 134  Knee extension Seated P: -17* A: -26* Seated P: -6* A: -24* Seated  LAQ A: -19* Supine Quad set  A: -4* A: -5 (seated LAQ)  Ankle dorsiflexion        Ankle plantarflexion        Ankle inversion        Ankle eversion         (Blank rows = not tested)   LOWER EXTREMITY MMT:   MMT Right eval Left eval  Hip flexion      Hip extension      Hip abduction      Hip adduction      Hip internal rotation      Hip external rotation      Knee flexion 3-/5 3-/5  Knee extension 3-/5 3-/5  Ankle dorsiflexion      Ankle plantarflexion      Ankle inversion      Ankle eversion       (Blank rows = not tested)     GAIT: 10/14/2022:   Distance walked:  100' Assistive device utilized: Environmental consultant - 2 wheeled Level of assistance: SBA Comments: BLE flexed knees in stance with trunk flexion, decreased step length BLEs, step-to pattern leading with LLE, excessive weight bearing on RW       TODAY'S TREATMENT: 10/29/22 Therapeutic Exercise: SciFit bike seat 8 level 1 LEs only x 8 min Slant board stretch 3x30 sec Leg Press bil 3x10; 50#; single limb 25# 3x10 performed bil AROM measurements (see above) Bridges 2x10; 5 sec hold LAQ 4# 3x10; performed bil   10/23/22 Therapeutic Exercise: SciFit bike seat 8 level 1 LEs only x 8 min LAQ 2x10 with 4#; performed bil Sit to/from stand x 10 reps without UE support; elevated mat table Seated SLR 2x5; 5 sec hold bil Standing calf raises x 20 reps Squats 2x10 (small range) Seated hamstring stretch with overpressure at distal thigh 3x30 sec bil   10/21/2022 Therapeutic Exercise: SciFit bike seat 8 level 1 with BLEs/BUEs for 6 min then LEs only for 2 min Seated LAQ RLE 3# & active knee flexion with contralateral LE opposing motion for 5 sec hold 10 reps 2 sets  Gastroc stretch on step heel depression 30 sec hold 2 reps Heel raises 10 reps with BUE support RW Standing terminal knee ext RLE red theraband 10 reps and 2nd set 10 reps RLE SLS with RW support. PT tactile & verbal cues.  Hamstring stretch with RLE ext on bed with strap DF 30 sec hold 2 reps Supine quad set 5 sec hold 10 reps Supine SLR with quad set 10 reps  Neuromuscular Re-ed Tandem stance on foam beam 30 sec ea LE in front 2 reps intermittent touch  Manual Therapy PROM with overpressure for knee flexion seated & knee ext supine  10/16/2022: Therapeutic Exercise: NuStep seat 8 with BLEs/BUEs level 5 for 6 min then LEs only for 2 min Seated LAQ & active knee flexion with contralateral LE opposing motion for 5 sec hold 10 reps 2 sets Gastroc stretch on incline board 30 sec hold 2 reps Heel raises on incline board 10 reps with BUE  support Hamstring stretch with RLE ext on bed with strap DF 30 sec hold 2 reps Hip flexor stretch hooklying with RLE over edge of bed 30 sec 2 reps Quad stretch hooklying with RLE over edge of bed with strap knee flexion 30 sec hold 2 reps Supine quad set 5 sec hold 10 reps Supine SLR with quad set 10 reps Standing with RW support RLE TKE with red theraband 5 sec hold 10 reps  Self-Care: PT demo & verbal cues on scar mobs. Pt verbalized & return demo understanding.  Manual Therapy PROM with overpressure for knee flexion seated & knee ext supine   10/14/2022:   Therex:         HEP instruction/performance c cues for techniques, handout provided.  Trial set performed of each for comprehension and symptom assessment.  See below for exercise list.  Additional time spent in encouragement for elevation >/= 2X/day for >/= 15 minutes.  Noted decrease in range for exercise based off symptoms.     PATIENT EDUCATION:  Education details: see HEP below Person educated: Patient Education method: Programmer, multimediaxplanation, Demonstration, Verbal cues, and Handouts Education comprehension: verbalized understanding, returned demonstration, verbal cues required, and needs further education     HOME EXERCISE PROGRAM: Access Code: [email or web address] URL: [email or web address] Date: 10/14/2022 Prepared by: Vladimir Fasterobin Waldron   Exercises - Ankle Alphabet in Elevation  - 2-4 x daily - 7 x weekly - 1 sets - 1 reps - Quad Setting and Stretching  - 2-4 x daily - 7 x weekly - 5-10 sets - 10 reps - prop 5-10 minutes & quad set5 seconds hold - Supine Leg Press  - 2-4 x daily - 7 x weekly - 5-10 sets - 10 reps - 5 seconds hold - Supine Heel Slide with Strap  - 2-3 x daily - 7 x weekly - 2-3 sets - 10 reps - 5 seconds hold - Supine Straight Leg Raises  - 2-3 x daily - 7 x weekly - 2-3 sets - 10 reps - 5 seconds hold - Seated Knee Flexion Extension AROM   - 2-4 x daily - 7 x weekly - 2-3 sets - 10 reps - 5 seconds hold -  Seated straight leg lifts  - 2-3 x daily - 7 x weekly - 2-3 sets - 10 reps - 5 seconds hold - Seated Long Arc Quad  - 2-3 x daily - 7 x weekly - 2-3 sets - 10 reps - 5 seconds hold - Seated Hamstring Stretch with Strap  - 2-4 x daily -  7 x weekly - 1 sets - 3 reps - 20-30 seconds hold   CLINICAL IMPRESSION: Pt tolerated session well today with excellent improvement in extension noted today.  Will continue to benefit from PT to maximize function.   OBJECTIVE IMPAIRMENTS Abnormal gait, decreased activity tolerance, decreased balance, decreased endurance, decreased knowledge of condition, decreased knowledge of use of DME, decreased mobility, difficulty walking, decreased ROM, decreased strength, increased edema, impaired flexibility, postural dysfunction, and pain.    ACTIVITY LIMITATIONS carrying, lifting, bending, sitting, standing, squatting, sleeping, stairs, transfers, and locomotion level   PARTICIPATION LIMITATIONS: meal prep, cleaning, laundry, driving, community activity, and yard work   [place], Past/current experiences, and 1-2 comorbidities: see PMH  are also affecting patient's functional outcome.    REHAB POTENTIAL: Good   CLINICAL DECISION MAKING: Stable/uncomplicated   EVALUATION COMPLEXITY: Low     GOALS: Goals reviewed with patient? Yes   SHORT TERM GOALS: Target date: 11/08/2022   Patient independent and verbalizes compliance with initial HEP Baseline: SEE OBJECTIVE DATA Goal status: INITIAL   2.  Patient reports 50% improvement in right knee pain. Baseline: SEE OBJECTIVE DATA Goal status: INITIAL   3.  PROM right knee extension -3* to flexion 105* Baseline: SEE OBJECTIVE DATA Goal status: INITIAL   LONG TERM GOALS: Target date: 12/06/2022    Patient will improve FOTO score to 54% Baseline: SEE OBJECTIVE DATA Goal status: INITIAL   2.  Patient reports right knee pain </= 2/10 with standing & gait activities.  Baseline: SEE OBJECTIVE  DATA Goal status: INITIAL   3.  Right Knee PROM 0* extension to 115* flexion Baseline: SEE OBJECTIVE DATA Goal status: INITIAL   4.  Right Knee AROM seated -3* extension to 110* flexion Baseline: SEE OBJECTIVE DATA Goal status: INITIAL   5.  Patient ambulates >300' community distances including negotiating ramps, curbs & stairs with LRAD independently. Baseline: SEE OBJECTIVE DATA Goal status: INITIAL     PLAN: PT FREQUENCY: 2x/week   PT DURATION: 8 weeks   PLANNED INTERVENTIONS: Therapeutic exercises, Therapeutic activity, Neuromuscular re-education, Balance training, Gait training, Patient/Family education, Self Care, Joint mobilization, Stair training, DME instructions, Electrical stimulation, Cryotherapy, Moist heat, scar mobilization, Taping, Vasopneumatic device, Manual therapy, and physical performance testing   PLAN FOR NEXT SESSION: cont leg press, focus on quad strengthening   Faustino Congress, PT, DPT 10/29/2022, 10:17 AM

## 2022-10-31 ENCOUNTER — Ambulatory Visit (INDEPENDENT_AMBULATORY_CARE_PROVIDER_SITE_OTHER): Payer: Medicare Other | Admitting: Physical Therapy

## 2022-10-31 ENCOUNTER — Encounter: Payer: Medicare Other | Admitting: Physical Therapy

## 2022-10-31 ENCOUNTER — Encounter: Payer: Self-pay | Admitting: Physical Therapy

## 2022-10-31 DIAGNOSIS — M6281 Muscle weakness (generalized): Secondary | ICD-10-CM | POA: Diagnosis not present

## 2022-10-31 DIAGNOSIS — R2689 Other abnormalities of gait and mobility: Secondary | ICD-10-CM

## 2022-10-31 DIAGNOSIS — M25561 Pain in right knee: Secondary | ICD-10-CM

## 2022-10-31 DIAGNOSIS — M25661 Stiffness of right knee, not elsewhere classified: Secondary | ICD-10-CM

## 2022-10-31 DIAGNOSIS — R600 Localized edema: Secondary | ICD-10-CM

## 2022-10-31 DIAGNOSIS — R2681 Unsteadiness on feet: Secondary | ICD-10-CM

## 2022-10-31 NOTE — Therapy (Incomplete)
OUTPATIENT PHYSICAL THERAPY TREATMENT NOTE   Patient Name: Becky Murphy MRN: [id number] DOB:12/30/1951, 71 y.o., female Today's Date: 10/29/2022  PCP: Kaleen Mask, MD REFERRING PROVIDER: Cammy Copa, MD  END OF SESSION:   PT End of Session - 10/29/22 0934     Visit Number 5    Number of Visits 16    Date for PT Re-Evaluation 12/06/22    Authorization Type Medicare    PT Start Time 0930    PT Stop Time 1015    PT Time Calculation (min) 45 min    Activity Tolerance Patient tolerated treatment well;Patient limited by pain    Behavior During Therapy [facility] for tasks assessed/performed                Past Medical History:  Diagnosis Date   Anemia    low iron in the past   Anxiety    pt denies   Arthritis    "back?" (11/03/2015) knees/hands (08/28/22)   Chest pain 11/02/2015   Depression    pt denies   Exertional dyspnea 01/05/2016   GERD (gastroesophageal reflux disease)    Heart murmur    History of peptic ulcer "late 1970's"   Migraine    "maybe 3-4/yr now" (11/03/2015)   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   Complex valvuloplasty including triangular resection of posterior leaflet, artificial Gore-tex neochord placement x6 and 34 mm Memo 3D ring annuloplasty via right mini thoracotomy approach with clipping of LA appendage   Thyroid nodule 01/29/2016   2.4 cm enhancing mixed cystic and solid nodule inferior left thyroid gland noted on CT angiogram   Past Surgical History:  Procedure Laterality Date   BACK SURGERY     BREAST BIOPSY Left ~ 2005   BREAST  LUMPECTOMY Left ~ 2005   CARDIAC CATHETERIZATION N/A 01/16/2016   Procedure: Right/Left Heart Cath and Coronary Angiography;  Surgeon: Thurmon Fair, MD;  Location: [facility] INVASIVE CV LAB;  Service: Cardiovascular;  Laterality: N/A;   CLIPPING OF ATRIAL APPENDAGE Left 03/06/2016   Procedure: CLIPPING OF LEFT ATRIAL APPENDAGE using a 45 PRO2 AtriClip;  Surgeon: Purcell Nails, MD;  Location: [facility] OR;  Service: Open Heart Surgery;  Laterality: Left;   COLONOSCOPY     EUS N/A 02/20/2016   Procedure: ESOPHAGEAL ENDOSCOPIC ULTRASOUND (EUS) RADIAL;  Surgeon: Willis Modena, MD;  Location: [facility] ENDOSCOPY;  Service: Endoscopy;  Laterality: N/A;   EYE MUSCLE SURGERY Bilateral 1970's?   KNEE ARTHROSCOPY Right 04/04/2020   Procedure: RIGHT KNEE ARTHROSCOPY WITH DEBRIDEMENT;  Surgeon: Cammy Copa, MD;  Location: [facility] OR;  Service: Orthopedics;  Laterality: Right;   KNEE ARTHROSCOPY Right 04/10/2021   Procedure: right knee arthroscopy, debridement, placement of antibiotic beads;  Surgeon: Cammy Copa, MD;  Location: [facility] OR;  Service: Orthopedics;  Laterality: Right;   KNEE ARTHROSCOPY Left 12/10/2021   Procedure: LEFT KNEE ARTHROSCOPY, DEBRIDEMENT, PLACEMENT OF STIMULAN BEADS;  Surgeon: Cammy Copa, MD;  Location: [facility] OR;  Service: Orthopedics;  Laterality: Left;   LUMBAR DISC SURGERY  1992   "trimmed bulges off both sides"   MITRAL VALVE  REPAIR Right 03/06/2016   Procedure: MINIMALLY INVASIVE MITRAL VALVE REPAIR (MVR) using a 34 Sorin Memo 3D Ring;  Surgeon: Purcell Nailslarence H Owen, MD;  Location: [facility] OR;  Service: Open Heart Surgery;  Laterality: Right;   TEE WITHOUT CARDIOVERSION N/A 01/05/2016   Procedure: TRANSESOPHAGEAL ECHOCARDIOGRAM (TEE);  Surgeon: Thurmon FairMihai Croitoru, MD;  [facility];  Service: Cardiovascular;  Laterality: N/A;   TEE WITHOUT CARDIOVERSION N/A 03/06/2016   Procedure: TRANSESOPHAGEAL ECHOCARDIOGRAM (TEE);  Surgeon: Purcell Nailslarence H Owen, MD;  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   High risk medication use 04/17/2022   Vitamin B 12 deficiency 04/03/2022   Iron deficiency anemia 03/29/2022   Bilateral wrist pain 02/27/2022   Hemarthrosis 02/08/2022   Synovitis of left knee 07/19/2021   Vancomycin adverse reaction 04/19/2021   Therapeutic drug monitoring 04/19/2021   Synovitis of right knee 04/04/2020   Long term (current) use of anticoagulants [Z79.01] 03/14/2016   S/P minimally invasive mitral valve repair 03/06/2016   Pancreatic mass 01/29/2016   Thyroid nodule 01/29/2016   Exertional dyspnea 01/05/2016   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   Mitral insufficiency    Mitral valve prolapse 12/26/2015   Anxiety 11/02/2015    REFERRING DIAG: Z96.651 (ICD-10-CM) - Status post total right knee replacement  ONSET DATE: 09/03/2022  THERAPY DIAG:  Acute pain of right knee  Stiffness of right knee, not elsewhere classified  Muscle weakness (generalized)  Other abnormalities of gait and mobility  Unsteadiness on feet  Localized edema  Rationale for Evaluation and Treatment Rehabilitation  PERTINENT HISTORY: Right TKA 09/03/22, Left Knee Arthroscopy 12/10/21, Seronegative Inflammatory Arthritis, MVP with repair, anxiety, pancreatic mass, back sg, TEE cardioversion  PRECAUTIONS: Fall  SUBJECTIVE:                                                                                                                                                                                      SUBJECTIVE STATEMENT:  *** doing well, no complaints today   PAIN:  Are  you having pain? Yes: NPRS scale: *** RLE 2-3/10 LLE 5/10 today & since last PT lowest 1/10 highest 5/10 Pain location: both knees (right sides & back;  left on sides) Pain description: throbbing Aggravating factors: exercising  like lifting leg for RLE & walking for LLE Relieving factors: aspirin at night, ice, Icy-Hot cream at night  OBJECTIVE: (objective measures completed at initial evaluation unless otherwise dated)   PATIENT SURVEYS:  10/14/2022:   FOTO 40% with target 54%   COGNITION: 10/14/2022:   Overall cognitive status: Within functional limits for tasks assessed                 EDEMA:  10/14/2022:   RLE:  above knee 40.5cm  around knee  39.5cm  below knee  32.5cm  10/14/2022:   LLE:  above knee 37cm  around knee  37cm below knee 29cm   PALPATION: Right Knee Tenderness at patella tendon, hamstring tendons & medial /lateral joint line.  Denies tenderness at quad tendon, quadriceps or hamstring muscle bellies.   LOWER EXTREMITY ROM:   ROM P:passive  A:active Right eval Left eval Right 10/21/22 Right 10/29/22  Hip flexion        Hip extension        Hip abduction        Hip adduction        Hip internal rotation        Hip external rotation        Knee flexion Seated P: 104* Seated P: 116* Supine A: 128* A: 134  Knee extension Seated P: -17* A: -26* Seated P: -6* A: -24* Seated  LAQ A: -19* Supine Quad set  A: -4* A: -5 (seated LAQ)  Ankle dorsiflexion        Ankle plantarflexion        Ankle inversion        Ankle eversion         (Blank rows = not tested)   LOWER EXTREMITY MMT:   MMT Right eval Left eval  Hip flexion      Hip extension      Hip abduction      Hip adduction      Hip internal rotation      Hip external rotation      Knee flexion 3-/5 3-/5  Knee extension 3-/5 3-/5  Ankle dorsiflexion      Ankle plantarflexion      Ankle inversion      Ankle eversion       (Blank rows = not tested)     GAIT: 10/14/2022:   Distance  walked: 100' Assistive device utilized: Environmental consultant - 2 wheeled Level of assistance: SBA Comments: BLE flexed knees in stance with trunk flexion, decreased step length BLEs, step-to pattern leading with LLE, excessive weight bearing on RW       TODAY'S TREATMENT: 10/31/2022 ***  10/29/22 Therapeutic Exercise: SciFit bike seat 8 level 1 LEs only x 8 min Slant board stretch 3x30 sec Leg Press bil 3x10; 50#; single limb 25# 3x10 performed bil AROM measurements (see above) Bridges 2x10; 5 sec hold LAQ 4# 3x10; performed bil   10/23/22 Therapeutic Exercise: SciFit bike seat 8 level 1 LEs only x 8 min LAQ 2x10 with 4#; performed bil Sit to/from stand x 10 reps without UE support; elevated mat table Seated SLR 2x5; 5 sec hold bil Standing calf raises x 20 reps Squats 2x10 (small range) Seated hamstring stretch with overpressure at distal thigh 3x30 sec bil    PATIENT EDUCATION:  Education details: see HEP below Person educated: Patient Education method: Consulting civil engineer, Demonstration, Verbal cues, and Handouts Education comprehension: verbalized understanding, returned demonstration, verbal cues required, and needs further education  HOME EXERCISE PROGRAM: Access Code: [email or web address] URL: [email or web address] Date: 10/14/2022 Prepared by: Vladimir Faster   Exercises - Ankle Alphabet in Elevation  - 2-4 x daily - 7 x weekly - 1 sets - 1 reps - Quad Setting and Stretching  - 2-4 x daily - 7 x weekly - 5-10 sets - 10 reps - prop 5-10 minutes & quad set5 seconds hold - Supine Leg Press  - 2-4 x daily - 7 x weekly - 5-10 sets - 10 reps - 5 seconds hold - Supine Heel Slide with Strap  - 2-3 x daily - 7 x weekly - 2-3 sets - 10 reps - 5 seconds hold - Supine Straight Leg Raises  - 2-3 x daily - 7 x weekly - 2-3 sets - 10 reps - 5 seconds hold - Seated Knee Flexion Extension AROM   - 2-4 x daily - 7 x weekly - 2-3 sets - 10 reps - 5 seconds hold - Seated straight leg lifts  - 2-3  x daily - 7 x weekly - 2-3 sets - 10 reps - 5 seconds hold - Seated Long Arc Quad  - 2-3 x daily - 7 x weekly - 2-3 sets - 10 reps - 5 seconds hold - Seated Hamstring Stretch with Strap  - 2-4 x daily - 7 x weekly - 1 sets - 3 reps - 20-30 seconds hold   CLINICAL IMPRESSION: ***  Pt tolerated session well today with excellent improvement in extension noted today.  Will continue to benefit from PT to maximize function.   OBJECTIVE IMPAIRMENTS Abnormal gait, decreased activity tolerance, decreased balance, decreased endurance, decreased knowledge of condition, decreased knowledge of use of DME, decreased mobility, difficulty walking, decreased ROM, decreased strength, increased edema, impaired flexibility, postural dysfunction, and pain.    ACTIVITY LIMITATIONS carrying, lifting, bending, sitting, standing, squatting, sleeping, stairs, transfers, and locomotion level   PARTICIPATION LIMITATIONS: meal prep, cleaning, laundry, driving, community activity, and yard work   PERSONAL FACTORS Fitness, Past/current experiences, and 1-2 comorbidities: see PMH  are also affecting patient's functional outcome.    REHAB POTENTIAL: Good   CLINICAL DECISION MAKING: Stable/uncomplicated   EVALUATION COMPLEXITY: Low     GOALS: Goals reviewed with patient? Yes   SHORT TERM GOALS: Target date: 11/08/2022   Patient independent and verbalizes compliance with initial HEP Baseline: SEE OBJECTIVE DATA Goal status: INITIAL   2.  Patient reports 50% improvement in right knee pain. Baseline: SEE OBJECTIVE DATA Goal status: INITIAL   3.  PROM right knee extension -3* to flexion 105* Baseline: SEE OBJECTIVE DATA Goal status: INITIAL   LONG TERM GOALS: Target date: 12/06/2022    Patient will improve FOTO score to 54% Baseline: SEE OBJECTIVE DATA Goal status: INITIAL   2.  Patient reports right knee pain </= 2/10 with standing & gait activities.  Baseline: SEE OBJECTIVE DATA Goal status: INITIAL    3.  Right Knee PROM 0* extension to 115* flexion Baseline: SEE OBJECTIVE DATA Goal status: INITIAL   4.  Right Knee AROM seated -3* extension to 110* flexion Baseline: SEE OBJECTIVE DATA Goal status: INITIAL   5.  Patient ambulates >300' community distances including negotiating ramps, curbs & stairs with LRAD independently. Baseline: SEE OBJECTIVE DATA Goal status: INITIAL     PLAN: PT FREQUENCY: 2x/week   PT DURATION: 8 weeks   PLANNED INTERVENTIONS: Therapeutic exercises, Therapeutic activity, Neuromuscular re-education, Balance training, Gait training, Patient/Family education, Self Care, Joint mobilization, Stair training, DME instructions, Electrical stimulation, Cryotherapy,  Moist heat, scar mobilization, Taping, Vasopneumatic device, Manual therapy, and physical performance testing   PLAN FOR NEXT SESSION: ***  cont leg press, focus on quad strengthening    Vladimir Faster, PT, DPT 10/31/2022, 8:01 AM

## 2022-10-31 NOTE — Therapy (Signed)
OUTPATIENT PHYSICAL THERAPY TREATMENT NOTE   Patient Name: Becky Murphy MRN: [id number] DOB:[birth year], 71 y.o., female Today's Date: 10/31/2022  PCP: Kaleen Mask, MD REFERRING PROVIDER: Cammy Copa, MD  END OF SESSION:   PT End of Session - 10/31/22 1057     Visit Number 6    Number of Visits 16    Date for PT Re-Evaluation 12/06/22    Authorization Type Medicare    PT Start Time 1059    PT Stop Time 1058    PT Time Calculation (min) 1439 min    Activity Tolerance Patient tolerated treatment well;Patient limited by pain    Behavior During Therapy [facility] for tasks assessed/performed                Past Medical History:  Diagnosis Date   Anemia    low iron in the past   Anxiety    pt denies   Arthritis    "back?" (11/03/2015) knees/hands (08/28/22)   Chest pain 11/02/2015   Depression    pt denies   Exertional dyspnea 01/05/2016   GERD (gastroesophageal reflux disease)    Heart murmur    History of peptic ulcer "late 1970's"   Migraine    "maybe 3-4/yr now" (11/03/2015)   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   Complex valvuloplasty including triangular resection of posterior leaflet, artificial Gore-tex neochord placement x6 and 34 mm Memo 3D ring annuloplasty via right mini thoracotomy approach with clipping of LA appendage   Thyroid nodule 01/29/2016   2.4 cm enhancing mixed cystic and solid nodule inferior left thyroid gland noted on CT angiogram   Past Surgical History:  Procedure Laterality Date   BACK SURGERY     BREAST BIOPSY Left ~ 2005   BREAST  LUMPECTOMY Left ~ 2005   CARDIAC CATHETERIZATION N/A 01/16/2016   Procedure: Right/Left Heart Cath and Coronary Angiography;  Surgeon: Thurmon Fair, MD;  Location: [facility] INVASIVE CV LAB;  Service: Cardiovascular;  Laterality: N/A;   CLIPPING OF ATRIAL APPENDAGE Left 03/06/2016   Procedure: CLIPPING OF LEFT ATRIAL APPENDAGE using a 45 PRO2 AtriClip;  Surgeon: Purcell Nails, MD;  Location: [facility] OR;  Service: Open Heart Surgery;  Laterality: Left;   COLONOSCOPY     EUS N/A 02/20/2016   Procedure: ESOPHAGEAL ENDOSCOPIC ULTRASOUND (EUS) RADIAL;  Surgeon: Willis Modena, MD;  Location: [facility] ENDOSCOPY;  Service: Endoscopy;  Laterality: N/A;   EYE MUSCLE SURGERY Bilateral 1970's?   KNEE ARTHROSCOPY Right 04/04/2020   Procedure: RIGHT KNEE ARTHROSCOPY WITH DEBRIDEMENT;  Surgeon: Cammy Copa, MD;  Location: [facility] OR;  Service: Orthopedics;  Laterality: Right;   KNEE ARTHROSCOPY Right 04/10/2021   Procedure: right knee arthroscopy, debridement, placement of antibiotic beads;  Surgeon: Cammy Copa, MD;  Location: [facility] OR;  Service: Orthopedics;  Laterality: Right;   KNEE ARTHROSCOPY Left 12/10/2021   Procedure: LEFT KNEE ARTHROSCOPY, DEBRIDEMENT, PLACEMENT OF STIMULAN BEADS;  Surgeon: Cammy Copa, MD;  Location: [facility] OR;  Service: Orthopedics;  Laterality: Left;   LUMBAR DISC SURGERY  1992   "trimmed bulges off both sides"   MITRAL VALVE  REPAIR Right 03/06/2016   Procedure: MINIMALLY INVASIVE MITRAL VALVE REPAIR (MVR) using a 34 Sorin Memo 3D Ring;  Surgeon: Purcell Nailslarence H Owen, MD;  Location: [facility] OR;  Service: Open Heart Surgery;  Laterality: Right;   TEE WITHOUT CARDIOVERSION N/A 01/05/2016   Procedure: TRANSESOPHAGEAL ECHOCARDIOGRAM (TEE);  Surgeon: Thurmon FairMihai Croitoru, MD;  [facility];  Service: Cardiovascular;  Laterality: N/A;   TEE WITHOUT CARDIOVERSION N/A 03/06/2016   Procedure: TRANSESOPHAGEAL ECHOCARDIOGRAM (TEE);  Surgeon: Purcell Nailslarence H Owen, MD;  [facility];  Service: Open Heart Surgery;   Laterality: N/A;   TOTAL KNEE ARTHROPLASTY Right 09/03/2022   Procedure: RIGHT TOTAL KNEE ARTHROPLASTY-CEMENTED;  Surgeon: Cammy Copaean, Gregory Scott, MD;  [facility];  Service: Orthopedics;  Laterality: Right;   TUBAL LIGATION     Patient Active Problem List   Diagnosis Date Noted   Arthritis of knee    Status post right knee replacement 09/03/2022   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   High risk medication use 04/17/2022   Vitamin B 12 deficiency 04/03/2022   Iron deficiency anemia 03/29/2022   Bilateral wrist pain 02/27/2022   Hemarthrosis 02/08/2022   Synovitis of left knee 07/19/2021   Vancomycin adverse reaction 04/19/2021   Therapeutic drug monitoring 04/19/2021   Synovitis of right knee 04/04/2020   Long term (current) use of anticoagulants [Z79.01] 03/14/2016   S/P minimally invasive mitral valve repair 03/06/2016   Pancreatic mass 01/29/2016   Thyroid nodule 01/29/2016   Exertional dyspnea 01/05/2016   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   Mitral insufficiency    Mitral valve prolapse 12/26/2015   Anxiety 11/02/2015    REFERRING DIAG: Z96.651 (ICD-10-CM) - Status post total right knee replacement  ONSET DATE: 09/03/2022  THERAPY DIAG:  Acute pain of right knee  Stiffness of right knee, not elsewhere classified  Muscle weakness (generalized)  Other abnormalities of gait and mobility  Unsteadiness on feet  Localized edema  Rationale for Evaluation and Treatment Rehabilitation  PERTINENT HISTORY: Right TKA 09/03/22, Left Knee Arthroscopy 12/10/21, Seronegative Inflammatory Arthritis, MVP with repair, anxiety, pancreatic mass, back sg, TEE cardioversion  PRECAUTIONS: Fall  SUBJECTIVE:                                                                                                                                                                                      SUBJECTIVE STATEMENT:   Her legs are doing "fine."  She is able to control  buckling of left knee when using RW.   PAIN:  Are you having pain? Yes: NPRS scale:  RLE 2-3/10 LLE 3/10 today & since last PT lowest 1/10 highest 4/10 Pain location: both knees (right  sides & back;  left on sides) Pain description: throbbing Aggravating factors: exercising like lifting leg for RLE & walking for LLE Relieving factors: aspirin at night, ice, Icy-Hot cream at night  OBJECTIVE: (objective measures completed at initial evaluation unless otherwise dated)   PATIENT SURVEYS:  10/14/2022:   FOTO 40% with target 54%   COGNITION: 10/14/2022:   Overall cognitive status: Within functional limits for tasks assessed                 EDEMA:  10/14/2022:   RLE:  above knee 40.5cm  around knee  39.5cm  below knee  32.5cm  10/14/2022:   LLE:  above knee 37cm  around knee  37cm below knee 29cm   PALPATION: Right Knee Tenderness at patella tendon, hamstring tendons & medial /lateral joint line.  Denies tenderness at quad tendon, quadriceps or hamstring muscle bellies.   LOWER EXTREMITY ROM:   ROM P:passive  A:active Right eval Left eval Right 10/21/22 Right 10/29/22  Hip flexion        Hip extension        Hip abduction        Hip adduction        Hip internal rotation        Hip external rotation        Knee flexion Seated P: 104* Seated P: 116* Supine A: 128* A: 134  Knee extension Seated P: -17* A: -26* Seated P: -6* A: -24* Seated  LAQ A: -19* Supine Quad set  A: -4* A: -5 (seated LAQ)  Ankle dorsiflexion        Ankle plantarflexion        Ankle inversion        Ankle eversion         (Blank rows = not tested)   LOWER EXTREMITY MMT:   MMT Right eval Left eval  Hip flexion      Hip extension      Hip abduction      Hip adduction      Hip internal rotation      Hip external rotation      Knee flexion 3-/5 3-/5  Knee extension 3-/5 3-/5  Ankle dorsiflexion      Ankle plantarflexion      Ankle inversion      Ankle eversion       (Blank rows = not  tested)     GAIT: 10/14/2022:   Distance walked: 100' Assistive device utilized: Environmental consultant - 2 wheeled Level of assistance: SBA Comments: BLE flexed knees in stance with trunk flexion, decreased step length BLEs, step-to pattern leading with LLE, excessive weight bearing on RW       TODAY'S TREATMENT: 10/31/2022 Therapeutic Exercise: Precor bike seat 6 level 1 LEs only x 8 min Gastroc stretch step heel depression 2 reps x 30 sec ea LE Leg Press bil 2x15  62#; single limb with ea LE 31# 2 x10  Heel raises on leg press machine 68# BLEs 10 reps 2 sets Bridges with hip flex so single leg stance stabilization alternating LEs 10 reps ea. LE LAQ 5# 2x15; performed BLEs   10/29/22 Therapeutic Exercise: SciFit bike seat 8 level 1 LEs only x 8 min Slant board stretch 3x30 sec Leg Press bil 3x10; 50#; single limb 25# 3x10 performed bil AROM measurements (see above) Bridges 2x10; 5 sec hold LAQ 4# 3x10; performed bil   10/23/22 Therapeutic Exercise: SciFit bike seat 8 level 1 LEs only x 8  min LAQ 2x10 with 4#; performed bil Sit to/from stand x 10 reps without UE support; elevated mat table Seated SLR 2x5; 5 sec hold bil Standing calf raises x 20 reps Squats 2x10 (small range) Seated hamstring stretch with overpressure at distal thigh 3x30 sec bil    PATIENT EDUCATION:  Education details: see HEP below Person educated: Patient Education method: Consulting civil engineer, Demonstration, Verbal cues, and Handouts Education comprehension: verbalized understanding, returned demonstration, verbal cues required, and needs further education     HOME EXERCISE PROGRAM: Access Code: [email or web address] URL: [email or web address] Date: 10/14/2022 Prepared by: Jamey Reas   Exercises - Ankle Alphabet in Elevation  - 2-4 x daily - 7 x weekly - 1 sets - 1 reps - Quad Setting and Stretching  - 2-4 x daily - 7 x weekly - 5-10 sets - 10 reps - prop 5-10 minutes & quad set5 seconds hold - Supine Leg  Press  - 2-4 x daily - 7 x weekly - 5-10 sets - 10 reps - 5 seconds hold - Supine Heel Slide with Strap  - 2-3 x daily - 7 x weekly - 2-3 sets - 10 reps - 5 seconds hold - Supine Straight Leg Raises  - 2-3 x daily - 7 x weekly - 2-3 sets - 10 reps - 5 seconds hold - Seated Knee Flexion Extension AROM   - 2-4 x daily - 7 x weekly - 2-3 sets - 10 reps - 5 seconds hold - Seated straight leg lifts  - 2-3 x daily - 7 x weekly - 2-3 sets - 10 reps - 5 seconds hold - Seated Long Arc Quad  - 2-3 x daily - 7 x weekly - 2-3 sets - 10 reps - 5 seconds hold - Seated Hamstring Stretch with Strap  - 2-4 x daily - 7 x weekly - 1 sets - 3 reps - 20-30 seconds hold   CLINICAL IMPRESSION: Patient's knee strength is improving including with functional activities.  She hopes that she can have left TKA 90 days after right TKA.  Her left knee continues to buckle intermittently which PT & pt feel she needs to stay on RW until left knee can be strengthened post surgery on that leg.    OBJECTIVE IMPAIRMENTS Abnormal gait, decreased activity tolerance, decreased balance, decreased endurance, decreased knowledge of condition, decreased knowledge of use of DME, decreased mobility, difficulty walking, decreased ROM, decreased strength, increased edema, impaired flexibility, postural dysfunction, and pain.    ACTIVITY LIMITATIONS carrying, lifting, bending, sitting, standing, squatting, sleeping, stairs, transfers, and locomotion level   PARTICIPATION LIMITATIONS: meal prep, cleaning, laundry, driving, community activity, and yard work   [place], Past/current experiences, and 1-2 comorbidities: see PMH  are also affecting patient's functional outcome.    REHAB POTENTIAL: Good   CLINICAL DECISION MAKING: Stable/uncomplicated   EVALUATION COMPLEXITY: Low     GOALS: Goals reviewed with patient? Yes   SHORT TERM GOALS: Target date: 11/08/2022   Patient independent and verbalizes compliance with  initial HEP Baseline: SEE OBJECTIVE DATA Goal status: INITIAL   2.  Patient reports 50% improvement in right knee pain. Baseline: SEE OBJECTIVE DATA Goal status: INITIAL   3.  PROM right knee extension -3* to flexion 105* Baseline: SEE OBJECTIVE DATA Goal status: INITIAL   LONG TERM GOALS: Target date: 12/06/2022    Patient will improve FOTO score to 54% Baseline: SEE OBJECTIVE DATA Goal status: INITIAL   2.  Patient reports right knee pain </= 2/10 with standing &  gait activities.  Baseline: SEE OBJECTIVE DATA Goal status: INITIAL   3.  Right Knee PROM 0* extension to 115* flexion Baseline: SEE OBJECTIVE DATA Goal status: INITIAL   4.  Right Knee AROM seated -3* extension to 110* flexion Baseline: SEE OBJECTIVE DATA Goal status: INITIAL   5.  Patient ambulates >300' community distances including negotiating ramps, curbs & stairs with LRAD independently. Baseline: SEE OBJECTIVE DATA Goal status: INITIAL     PLAN: PT FREQUENCY: 2x/week   PT DURATION: 8 weeks   PLANNED INTERVENTIONS: Therapeutic exercises, Therapeutic activity, Neuromuscular re-education, Balance training, Gait training, Patient/Family education, Self Care, Joint mobilization, Stair training, DME instructions, Electrical stimulation, Cryotherapy, Moist heat, scar mobilization, Taping, Vasopneumatic device, Manual therapy, and physical performance testing   PLAN FOR NEXT SESSION:   continue with strengthending exercises including leg press, focus on quad strengthening    Vladimir Faster, PT, DPT 10/31/2022, 11:51 AM

## 2022-11-05 ENCOUNTER — Encounter: Payer: Self-pay | Admitting: Physical Therapy

## 2022-11-05 ENCOUNTER — Ambulatory Visit (INDEPENDENT_AMBULATORY_CARE_PROVIDER_SITE_OTHER): Payer: Medicare Other | Admitting: Physical Therapy

## 2022-11-05 DIAGNOSIS — M6281 Muscle weakness (generalized): Secondary | ICD-10-CM

## 2022-11-05 DIAGNOSIS — R2689 Other abnormalities of gait and mobility: Secondary | ICD-10-CM | POA: Diagnosis not present

## 2022-11-05 DIAGNOSIS — M25561 Pain in right knee: Secondary | ICD-10-CM

## 2022-11-05 DIAGNOSIS — M25661 Stiffness of right knee, not elsewhere classified: Secondary | ICD-10-CM

## 2022-11-05 DIAGNOSIS — R600 Localized edema: Secondary | ICD-10-CM

## 2022-11-05 DIAGNOSIS — R2681 Unsteadiness on feet: Secondary | ICD-10-CM

## 2022-11-05 NOTE — Therapy (Signed)
OUTPATIENT PHYSICAL THERAPY TREATMENT NOTE   Patient Name: Becky Murphy MRN: [id number] DOB:[birth year], 71 y.o., female Today's Date: 11/05/2022  PCP: Leonard Downing, MD REFERRING PROVIDER: Meredith Pel, MD  END OF SESSION:   PT End of Session - 11/05/22 0927     Visit Number 7    Number of Visits 16    Date for PT Re-Evaluation 12/06/22    Authorization Type Medicare    PT Start Time 0928    PT Stop Time 1009    PT Time Calculation (min) 41 min    Activity Tolerance Patient tolerated treatment well;Patient limited by pain    Behavior During Therapy [facility] for tasks assessed/performed                Past Medical History:  Diagnosis Date   Anemia    low iron in the past   Anxiety    pt denies   Arthritis    "back?" (11/03/2015) knees/hands (08/28/22)   Chest pain 11/02/2015   Depression    pt denies   Exertional dyspnea 01/05/2016   GERD (gastroesophageal reflux disease)    Heart murmur    History of peptic ulcer "late 1970's"   Migraine    "maybe 3-4/yr now" (11/03/2015)   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   Complex valvuloplasty including triangular resection of posterior leaflet, artificial Gore-tex neochord placement x6 and 34 mm Memo 3D ring annuloplasty via right mini thoracotomy approach with clipping of LA appendage   Thyroid nodule 01/29/2016   2.4 cm enhancing mixed cystic and solid nodule inferior left thyroid gland noted on CT angiogram   Past Surgical History:  Procedure Laterality Date   BACK SURGERY     BREAST BIOPSY Left ~ 2005   BREAST  LUMPECTOMY Left ~ 2005   CARDIAC CATHETERIZATION N/A 01/16/2016   Procedure: Right/Left Heart Cath and Coronary Angiography;  Surgeon: Sanda Klein, MD;  Location: [place] [facility];  Service: Cardiovascular;  Laterality: N/A;   CLIPPING OF ATRIAL APPENDAGE Left 03/06/2016   Procedure: CLIPPING OF LEFT ATRIAL APPENDAGE using a 1 PRO2 AtriClip;  Surgeon: Rexene Alberts, MD;  Location: [place];  Service: Open Heart Surgery;  Laterality: Left;   COLONOSCOPY     EUS N/A 02/20/2016   Procedure: ESOPHAGEAL ENDOSCOPIC ULTRASOUND (EUS) RADIAL;  Surgeon: Arta Silence, MD;  Location: [facility];  Service: Endoscopy;  Laterality: N/A;   EYE MUSCLE SURGERY Bilateral 1970's?   KNEE ARTHROSCOPY Right 04/04/2020   Procedure: RIGHT KNEE ARTHROSCOPY WITH DEBRIDEMENT;  Surgeon: Meredith Pel, MD;  Location: [place];  Service: Orthopedics;  Laterality: Right;   KNEE ARTHROSCOPY Right 04/10/2021   Procedure: right knee arthroscopy, debridement, placement of antibiotic beads;  Surgeon: Meredith Pel, MD;  Location: [place];  Service: Orthopedics;  Laterality: Right;   KNEE ARTHROSCOPY Left 12/10/2021   Procedure: LEFT KNEE ARTHROSCOPY, DEBRIDEMENT, PLACEMENT OF STIMULAN BEADS;  Surgeon: Meredith Pel, MD;  Location: [place];  Service: Orthopedics;  Laterality: Left;   [place]   "trimmed bulges off both sides"   MITRAL VALVE  REPAIR Right 03/06/2016   Procedure: MINIMALLY INVASIVE MITRAL VALVE REPAIR (MVR) using a 34 Sorin Memo 3D Ring;  Surgeon: Rexene Alberts, MD;  Location: [place];  Service: Open Heart Surgery;  Laterality: Right;   TEE WITHOUT CARDIOVERSION N/A 01/05/2016   Procedure: TRANSESOPHAGEAL ECHOCARDIOGRAM (TEE);  Surgeon: Sanda Klein, MD;  Location: [facility];  Service: Cardiovascular;  Laterality: N/A;   TEE WITHOUT CARDIOVERSION N/A 03/06/2016   Procedure: TRANSESOPHAGEAL ECHOCARDIOGRAM (TEE);  Surgeon: Rexene Alberts, MD;  Location: [place];  Service: Open Heart Surgery;   Laterality: N/A;   TOTAL KNEE ARTHROPLASTY Right 09/03/2022   Procedure: RIGHT TOTAL KNEE ARTHROPLASTY-CEMENTED;  Surgeon: Meredith Pel, MD;  Location: [place];  Service: Orthopedics;  Laterality: Right;   TUBAL LIGATION     Patient Active Problem List   Diagnosis Date Noted   Arthritis of knee    Status post right knee replacement 09/03/2022   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   High risk medication use 04/17/2022   Vitamin B 12 deficiency 04/03/2022   Iron deficiency anemia 03/29/2022   Bilateral wrist pain 02/27/2022   Hemarthrosis 02/08/2022   Synovitis of left knee 07/19/2021   Vancomycin adverse reaction 04/19/2021   Therapeutic drug monitoring 04/19/2021   Synovitis of right knee 04/04/2020   Long term (current) use of anticoagulants [Z79.01] 03/14/2016   S/P minimally invasive mitral valve repair 03/06/2016   Pancreatic mass 01/29/2016   Thyroid nodule 01/29/2016   Exertional dyspnea 01/05/2016   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   Mitral insufficiency    Mitral valve prolapse 12/26/2015   Anxiety 11/02/2015    REFERRING DIAG: Z96.651 (ICD-10-CM) - Status post total right knee replacement  ONSET DATE: 09/03/2022  THERAPY DIAG:  Acute pain of right knee  Stiffness of right knee, not elsewhere classified  Muscle weakness (generalized)  Other abnormalities of gait and mobility  Unsteadiness on feet  Localized edema  Rationale for Evaluation and Treatment Rehabilitation  PERTINENT HISTORY: Right TKA 09/03/22, Left Knee Arthroscopy 12/10/21, Seronegative Inflammatory Arthritis, MVP with repair, anxiety, pancreatic mass, back sg, TEE cardioversion  PRECAUTIONS: Fall  SUBJECTIVE:                                                                                                                                                                                      SUBJECTIVE STATEMENT:  She is still walking with RW.  Her exercises are  going well.   PAIN:  Are you having pain? Yes: NPRS scale:   RLE 2/10 LLE 2/10 today & since last PT lowest 0/10 highest 4/10 Pain location: both knees (right sides & back;  left on  sides) Pain description: throbbing Aggravating factors: exercising like lifting leg for RLE & walking for LLE Relieving factors: aspirin at night, ice, Icy-Hot cream at night  OBJECTIVE: (objective measures completed at initial evaluation unless otherwise dated)   PATIENT SURVEYS:  10/14/2022:   FOTO 40% with target 54%   COGNITION: 10/14/2022:   Overall cognitive status: Within functional limits for tasks assessed                 EDEMA:  10/14/2022:   RLE:  above knee 40.5cm  around knee  39.5cm  below knee  32.5cm  10/14/2022:   LLE:  above knee 37cm  around knee  37cm below knee 29cm   PALPATION: Right Knee Tenderness at patella tendon, hamstring tendons & medial /lateral joint line.  Denies tenderness at quad tendon, quadriceps or hamstring muscle bellies.   LOWER EXTREMITY ROM:   ROM P:passive  A:active Right eval Left eval Right 10/21/22 Right 10/29/22  Hip flexion        Hip extension        Hip abduction        Hip adduction        Hip internal rotation        Hip external rotation        Knee flexion Seated P: 104* Seated P: 116* Supine A: 128* A: 134  Knee extension Seated P: -17* A: -26* Seated P: -6* A: -24* Seated  LAQ A: -19* Supine Quad set  A: -4* A: -5 (seated LAQ)  Ankle dorsiflexion        Ankle plantarflexion        Ankle inversion        Ankle eversion         (Blank rows = not tested)   LOWER EXTREMITY MMT:   MMT Right eval Left eval  Hip flexion      Hip extension      Hip abduction      Hip adduction      Hip internal rotation      Hip external rotation      Knee flexion 3-/5 3-/5  Knee extension 3-/5 3-/5  Ankle dorsiflexion      Ankle plantarflexion      Ankle inversion      Ankle eversion       (Blank rows = not tested)      GAIT: 10/14/2022:   Distance walked: 100' Assistive device utilized: Environmental consultant - 2 wheeled Level of assistance: SBA Comments: BLE flexed knees in stance with trunk flexion, decreased step length BLEs, step-to pattern leading with LLE, excessive weight bearing on RW       TODAY'S TREATMENT: 111/06/2022 Therapeutic Exercise: Precor bike seat 6 level 1 LEs only x 8 min Gastroc stretch step heel depression 2 reps x 30 sec ea LE Leg Press bil 2x15  75#; single limb with ea LE 31# 2 x10  Heel raises on leg press machine 75# BLEs 15 reps 1 sets LAQ 5# 2x15; performed BLEs Seated hamstring curl green theraband 10 reps 2 sets RLE Standing RW support  terminal knee ext green theraband 10 reps, 1st set TKE RLE, 2nd set RLE TKE SLS lifting LLE 3 sec Standing alternating LEs hip abd 10 reps with focus stance knee ext / control Standing alternating LEs hip ext 10 reps with focus stance knee ext / control  12/31/2021 Therapeutic Exercise: Precor bike seat 6 level 1 LEs only x 8 min Gastroc stretch step heel depression  2 reps x 30 sec ea LE Leg Press bil 2x15  62#; single limb with ea LE 31# 2 x10  Heel raises on leg press machine 68# BLEs 10 reps 2 sets Bridges with hip flex so single leg stance stabilization alternating LEs 10 reps ea. LE LAQ 5# 2x15; performed BLEs   10/29/22 Therapeutic Exercise: SciFit bike seat 8 level 1 LEs only x 8 min Slant board stretch 3x30 sec Leg Press bil 3x10; 50#; single limb 25# 3x10 performed bil AROM measurements (see above) Bridges 2x10; 5 sec hold LAQ 4# 3x10; performed bil     PATIENT EDUCATION:  Education details: see HEP below Person educated: Patient Education method: Consulting civil engineer, Demonstration, Verbal cues, and Handouts Education comprehension: verbalized understanding, returned demonstration, verbal cues required, and needs further education     HOME EXERCISE PROGRAM: Access Code: [email or web address] URL: [email or web address] Date:  10/14/2022 Prepared by: Jamey Reas   Exercises - Ankle Alphabet in Elevation  - 2-4 x daily - 7 x weekly - 1 sets - 1 reps - Quad Setting and Stretching  - 2-4 x daily - 7 x weekly - 5-10 sets - 10 reps - prop 5-10 minutes & quad set5 seconds hold - Supine Leg Press  - 2-4 x daily - 7 x weekly - 5-10 sets - 10 reps - 5 seconds hold - Supine Heel Slide with Strap  - 2-3 x daily - 7 x weekly - 2-3 sets - 10 reps - 5 seconds hold - Supine Straight Leg Raises  - 2-3 x daily - 7 x weekly - 2-3 sets - 10 reps - 5 seconds hold - Seated Knee Flexion Extension AROM   - 2-4 x daily - 7 x weekly - 2-3 sets - 10 reps - 5 seconds hold - Seated straight leg lifts  - 2-3 x daily - 7 x weekly - 2-3 sets - 10 reps - 5 seconds hold - Seated Long Arc Quad  - 2-3 x daily - 7 x weekly - 2-3 sets - 10 reps - 5 seconds hold - Seated Hamstring Stretch with Strap  - 2-4 x daily - 7 x weekly - 1 sets - 3 reps - 20-30 seconds hold   CLINICAL IMPRESSION: Patient's pain is improving and she is able to perform more standing functional activities.  Her left knee continues to buckle intermittently which PT & pt feel she needs to stay on RW until left knee can be strengthened post surgery on that leg.    OBJECTIVE IMPAIRMENTS Abnormal gait, decreased activity tolerance, decreased balance, decreased endurance, decreased knowledge of condition, decreased knowledge of use of DME, decreased mobility, difficulty walking, decreased ROM, decreased strength, increased edema, impaired flexibility, postural dysfunction, and pain.    ACTIVITY LIMITATIONS carrying, lifting, bending, sitting, standing, squatting, sleeping, stairs, transfers, and locomotion level   PARTICIPATION LIMITATIONS: meal prep, cleaning, laundry, driving, community activity, and yard work   [place], Past/current experiences, and 1-2 comorbidities: see PMH  are also affecting patient's functional outcome.    REHAB POTENTIAL: Good   CLINICAL  DECISION MAKING: Stable/uncomplicated   EVALUATION COMPLEXITY: Low     GOALS: Goals reviewed with patient? Yes   SHORT TERM GOALS: Target date: 11/08/2022   Patient independent and verbalizes compliance with initial HEP Baseline: SEE OBJECTIVE DATA Goal status: MET 11/05/2022    2.  Patient reports 50% improvement in right knee pain. Baseline: SEE OBJECTIVE DATA Goal status: MET 11/05/2022   3.  PROM right knee extension -3*  to flexion 105* Baseline: SEE OBJECTIVE DATA Goal status: INITIAL   LONG TERM GOALS: Target date: 12/06/2022    Patient will improve FOTO score to 54% Baseline: SEE OBJECTIVE DATA Goal status: INITIAL   2.  Patient reports right knee pain </= 2/10 with standing & gait activities.  Baseline: SEE OBJECTIVE DATA Goal status: INITIAL   3.  Right Knee PROM 0* extension to 115* flexion Baseline: SEE OBJECTIVE DATA Goal status: INITIAL   4.  Right Knee AROM seated -3* extension to 110* flexion Baseline: SEE OBJECTIVE DATA Goal status: INITIAL   5.  Patient ambulates >300' community distances including negotiating ramps, curbs & stairs with LRAD independently. Baseline: SEE OBJECTIVE DATA Goal status: INITIAL     PLAN: PT FREQUENCY: 2x/week   PT DURATION: 8 weeks   PLANNED INTERVENTIONS: Therapeutic exercises, Therapeutic activity, Neuromuscular re-education, Balance training, Gait training, Patient/Family education, Self Care, Joint mobilization, Stair training, DME instructions, Electrical stimulation, Cryotherapy, Moist heat, scar mobilization, Taping, Vasopneumatic device, Manual therapy, and physical performance testing   PLAN FOR NEXT SESSION:   check PROM STG,  continue with strengthending exercises including leg press, focus on quad strengthening    Jamey Reas, PT, DPT 11/05/2022, 10:26 AM

## 2022-11-07 ENCOUNTER — Encounter: Payer: Self-pay | Admitting: Physical Therapy

## 2022-11-07 ENCOUNTER — Encounter: Payer: Medicare Other | Admitting: Physical Therapy

## 2022-11-07 ENCOUNTER — Ambulatory Visit (INDEPENDENT_AMBULATORY_CARE_PROVIDER_SITE_OTHER): Payer: Medicare Other | Admitting: Physical Therapy

## 2022-11-07 DIAGNOSIS — R2689 Other abnormalities of gait and mobility: Secondary | ICD-10-CM | POA: Diagnosis not present

## 2022-11-07 DIAGNOSIS — M6281 Muscle weakness (generalized): Secondary | ICD-10-CM | POA: Diagnosis not present

## 2022-11-07 DIAGNOSIS — M25661 Stiffness of right knee, not elsewhere classified: Secondary | ICD-10-CM

## 2022-11-07 DIAGNOSIS — R600 Localized edema: Secondary | ICD-10-CM

## 2022-11-07 DIAGNOSIS — M25561 Pain in right knee: Secondary | ICD-10-CM | POA: Diagnosis not present

## 2022-11-07 DIAGNOSIS — R2681 Unsteadiness on feet: Secondary | ICD-10-CM

## 2022-11-07 NOTE — Therapy (Signed)
OUTPATIENT PHYSICAL THERAPY TREATMENT NOTE   Patient Name: Becky Murphy MRN: [id number] DOB:1951-10-26, 71 y.o., female Today's Date: 11/07/2022  PCP: Leonard Downing, MD REFERRING PROVIDER: Meredith Pel, MD  END OF SESSION:   PT End of Session - 11/07/22 0824     Visit Number 8    Number of Visits 16    Date for PT Re-Evaluation 12/06/22    Authorization Type Medicare    PT Start Time 0824    PT Stop Time 0912    PT Time Calculation (min) 48 min    Activity Tolerance Patient tolerated treatment well;Patient limited by pain    Behavior During Therapy [facility] for tasks assessed/performed                 Past Medical History:  Diagnosis Date   Anemia    low iron in the past   Anxiety    pt denies   Arthritis    "back?" (11/03/2015) knees/hands (08/28/22)   Chest pain 11/02/2015   Depression    pt denies   Exertional dyspnea 01/05/2016   GERD (gastroesophageal reflux disease)    Heart murmur    History of peptic ulcer "late 1970's"   Migraine    "maybe 3-4/yr now" (11/03/2015)   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   Complex valvuloplasty including triangular resection of posterior leaflet, artificial Gore-tex neochord placement x6 and 34 mm Memo 3D ring annuloplasty via right mini thoracotomy approach with clipping of LA appendage   Thyroid nodule 01/29/2016   2.4 cm enhancing mixed cystic and solid nodule inferior left thyroid gland noted on CT angiogram   Past Surgical History:  Procedure Laterality Date   BACK SURGERY     BREAST BIOPSY Left ~ 2005   BREAST  LUMPECTOMY Left ~ 2005   CARDIAC CATHETERIZATION N/A 01/16/2016   Procedure: Right/Left Heart Cath and Coronary Angiography;  Surgeon: Sanda Klein, MD;  Location: [place] [facility];  Service: Cardiovascular;  Laterality: N/A;   CLIPPING OF ATRIAL APPENDAGE Left 03/06/2016   Procedure: CLIPPING OF LEFT ATRIAL APPENDAGE using a 2 PRO2 AtriClip;  Surgeon: Rexene Alberts, MD;  Location: [place];  Service: Open Heart Surgery;  Laterality: Left;   COLONOSCOPY     EUS N/A 02/20/2016   Procedure: ESOPHAGEAL ENDOSCOPIC ULTRASOUND (EUS) RADIAL;  Surgeon: Arta Silence, MD;  Location: [facility];  Service: Endoscopy;  Laterality: N/A;   EYE MUSCLE SURGERY Bilateral 1970's?   KNEE ARTHROSCOPY Right 04/04/2020   Procedure: RIGHT KNEE ARTHROSCOPY WITH DEBRIDEMENT;  Surgeon: Meredith Pel, MD;  Location: [place];  Service: Orthopedics;  Laterality: Right;   KNEE ARTHROSCOPY Right 04/10/2021   Procedure: right knee arthroscopy, debridement, placement of antibiotic beads;  Surgeon: Meredith Pel, MD;  Location: [place];  Service: Orthopedics;  Laterality: Right;   KNEE ARTHROSCOPY Left 12/10/2021   Procedure: LEFT KNEE ARTHROSCOPY, DEBRIDEMENT, PLACEMENT OF STIMULAN BEADS;  Surgeon: Meredith Pel, MD;  Location: [place];  Service: Orthopedics;  Laterality: Left;   [place]   "trimmed bulges off both sides"   MITRAL  VALVE REPAIR Right 03/06/2016   Procedure: MINIMALLY INVASIVE MITRAL VALVE REPAIR (MVR) using a 34 Sorin Memo 3D Ring;  Surgeon: Rexene Alberts, MD;  Location: [place];  Service: Open Heart Surgery;  Laterality: Right;   TEE WITHOUT CARDIOVERSION N/A 01/05/2016   Procedure: TRANSESOPHAGEAL ECHOCARDIOGRAM (TEE);  Surgeon: Sanda Klein, MD;  Location: [facility];  Service: Cardiovascular;  Laterality: N/A;   TEE WITHOUT CARDIOVERSION N/A 03/06/2016   Procedure: TRANSESOPHAGEAL ECHOCARDIOGRAM (TEE);  Surgeon: Rexene Alberts, MD;  Location: [place];  Service: Open Heart Surgery;   Laterality: N/A;   TOTAL KNEE ARTHROPLASTY Right 09/03/2022   Procedure: RIGHT TOTAL KNEE ARTHROPLASTY-CEMENTED;  Surgeon: Meredith Pel, MD;  Location: [place];  Service: Orthopedics;  Laterality: Right;   TUBAL LIGATION     Patient Active Problem List   Diagnosis Date Noted   Arthritis of knee    Status post right knee replacement 09/03/2022   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   High risk medication use 04/17/2022   Vitamin B 12 deficiency 04/03/2022   Iron deficiency anemia 03/29/2022   Bilateral wrist pain 02/27/2022   Hemarthrosis 02/08/2022   Synovitis of left knee 07/19/2021   Vancomycin adverse reaction 04/19/2021   Therapeutic drug monitoring 04/19/2021   Synovitis of right knee 04/04/2020   Long term (current) use of anticoagulants [Z79.01] 03/14/2016   S/P minimally invasive mitral valve repair 03/06/2016   Pancreatic mass 01/29/2016   Thyroid nodule 01/29/2016   Exertional dyspnea 01/05/2016   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   Mitral insufficiency    Mitral valve prolapse 12/26/2015   Anxiety 11/02/2015    REFERRING DIAG: Z96.651 (ICD-10-CM) - Status post total right knee replacement  ONSET DATE: 09/03/2022  THERAPY DIAG:  Acute pain of right knee  Stiffness of right knee, not elsewhere classified  Muscle weakness (generalized)  Other abnormalities of gait and mobility  Unsteadiness on feet  Localized edema  Rationale for Evaluation and Treatment Rehabilitation  PERTINENT HISTORY: Right TKA 09/03/22, Left Knee Arthroscopy 12/10/21, Seronegative Inflammatory Arthritis, MVP with repair, anxiety, pancreatic mass, back sg, TEE cardioversion  PRECAUTIONS: Fall  SUBJECTIVE:                                                                                                                                                                                      SUBJECTIVE STATEMENT:   She is having to put together her walk in closet  which is requiring increased standing for 5-7 minutes.  Her exercises are going well.   PAIN:  Are you having pain? Yes: NPRS scale:   RLE 2/10 LLE 3/10 today & since last PT lowest  0/10 highest 4/10 Pain location: both knees (right sides & back;  left on sides) Pain description: throbbing Aggravating factors: exercising like lifting leg for RLE & walking for LLE Relieving factors: aspirin at night, ice, Icy-Hot cream at night  OBJECTIVE: (objective measures completed at initial evaluation unless otherwise dated)   PATIENT SURVEYS:  10/14/2022:   FOTO 40% with target 54%   COGNITION: 10/14/2022:   Overall cognitive status: Within functional limits for tasks assessed                 EDEMA:  10/14/2022:   RLE:  above knee 40.5cm  around knee  39.5cm  below knee  32.5cm  10/14/2022:   LLE:  above knee 37cm  around knee  37cm below knee 29cm   PALPATION: Right Knee Tenderness at patella tendon, hamstring tendons & medial /lateral joint line.  Denies tenderness at quad tendon, quadriceps or hamstring muscle bellies.   LOWER EXTREMITY ROM:   ROM P:passive  A:active Right eval Left eval Right 10/21/22 Right 10/29/22 Right 11/07/22  Hip flexion         Hip extension         Hip abduction         Hip adduction         Hip internal rotation         Hip external rotation         Knee flexion Seated P: 104* Seated P: 116* Supine A: 128* A: 134 Seated P: 135*  Knee extension Seated P: -17* A: -26* Seated P: -6* A: -24* Seated  LAQ A: -19* Supine Quad set  A: -4* A: -5 (seated LAQ) Seated A: LAQ -5* P: 0*  Ankle dorsiflexion         Ankle plantarflexion         Ankle inversion         Ankle eversion          (Blank rows = not tested)   LOWER EXTREMITY MMT:   MMT Right eval Left eval  Hip flexion      Hip extension      Hip abduction      Hip adduction      Hip internal rotation      Hip external rotation      Knee flexion 3-/5 3-/5  Knee extension 3-/5 3-/5   Ankle dorsiflexion      Ankle plantarflexion      Ankle inversion      Ankle eversion       (Blank rows = not tested)     GAIT: 10/14/2022:   Distance walked: 100' Assistive device utilized: Environmental consultant - 2 wheeled Level of assistance: SBA Comments: BLE flexed knees in stance with trunk flexion, decreased step length BLEs, step-to pattern leading with LLE, excessive weight bearing on RW       TODAY'S TREATMENT: 11/07/2022 Therapeutic Exercise: Precor bike seat 6 level 1 LEs only x 8 min Gastroc stretch incline board 2 reps x 30 sec 1st rep BLE, 2nd rep straddle step RLE Leg Press bil 2x15  81#; single limb with ea LE 37# 15 reps   Heel raises on leg press machine 81# BLEs 15 reps 1 sets Hamstring stretch passive during rests on leg press 30 sec hold 2 reps ea LE Sit to/from stand from 24" bar stool without UEs 15 reps LAQ 5# 2x15; performed BLEs Seated hamstring curl green theraband 10 reps 2 sets BLEs Standing RW support  terminal  knee ext green theraband 10 reps, 1st set TKE RLE, 2nd set RLE TKE SLS lifting LLE 3 sec & vice versa for LLE Standing heel raises LUE on counter reaching RUE to top shelf of cabinet 10 reps Standing alternating LEs hip abd 10 reps with focus stance knee ext / control Standing alternating LEs hip ext 10 reps with focus stance knee ext / control  Therapeutic Activities: PT demo & verbal cues on using 24" bar stool for intermittent rest / use with standing activities to decrease prolonged standing that increases her pain. Pt verbalized & return demo understanding including safe sit/stand  11/05/2022 Therapeutic Exercise: Precor bike seat 6 level 2 LEs only x 8 min Gastroc stretch step heel depression 2 reps x 30 sec ea LE Leg Press bil 2x15  75#; single limb with ea LE 31# 2 x10  Heel raises on leg press machine 75# BLEs 15 reps 1 sets LAQ 5# 2x15; performed BLEs Seated hamstring curl green theraband 10 reps 2 sets RLE Standing RW support  terminal knee  ext green theraband 10 reps, 1st set TKE RLE, 2nd set RLE TKE SLS lifting LLE 3 sec Standing alternating LEs hip abd 10 reps with focus stance knee ext / control Standing alternating LEs hip ext 10 reps with focus stance knee ext / control  12/31/2021 Therapeutic Exercise: Precor bike seat 6 level 1 LEs only x 8 min Gastroc stretch step heel depression 2 reps x 30 sec ea LE Leg Press bil 2x15  62#; single limb with ea LE 31# 2 x10  Heel raises on leg press machine 68# BLEs 10 reps 2 sets Bridges with hip flex so single leg stance stabilization alternating LEs 10 reps ea. LE LAQ 5# 2x15; performed BLEs     PATIENT EDUCATION:  Education details: see HEP below Person educated: Patient Education method: Explanation, Demonstration, Verbal cues, and Handouts Education comprehension: verbalized understanding, returned demonstration, verbal cues required, and needs further education     HOME EXERCISE PROGRAM: Access Code: [email or web address] URL: [email or web address] Date: 10/14/2022 Prepared by: Jamey Reas   Exercises - Ankle Alphabet in Elevation  - 2-4 x daily - 7 x weekly - 1 sets - 1 reps - Quad Setting and Stretching  - 2-4 x daily - 7 x weekly - 5-10 sets - 10 reps - prop 5-10 minutes & quad set5 seconds hold - Supine Leg Press  - 2-4 x daily - 7 x weekly - 5-10 sets - 10 reps - 5 seconds hold - Supine Heel Slide with Strap  - 2-3 x daily - 7 x weekly - 2-3 sets - 10 reps - 5 seconds hold - Supine Straight Leg Raises  - 2-3 x daily - 7 x weekly - 2-3 sets - 10 reps - 5 seconds hold - Seated Knee Flexion Extension AROM   - 2-4 x daily - 7 x weekly - 2-3 sets - 10 reps - 5 seconds hold - Seated straight leg lifts  - 2-3 x daily - 7 x weekly - 2-3 sets - 10 reps - 5 seconds hold - Seated Long Arc Quad  - 2-3 x daily - 7 x weekly - 2-3 sets - 10 reps - 5 seconds hold - Seated Hamstring Stretch with Strap  - 2-4 x daily - 7 x weekly - 1 sets - 3 reps - 20-30 seconds  hold   CLINICAL IMPRESSION: Patient is improving with knee strength & functional mobility.  PT anticipates discharge next week. She is  seeing Dr. Marlou Sa on 11/22 and wants to discuss scheduling left TKA at earliest possible time.  She is doing strengthening exercises for BLEs which should improve recovery if she has LLE TKA.    OBJECTIVE IMPAIRMENTS Abnormal gait, decreased activity tolerance, decreased balance, decreased endurance, decreased knowledge of condition, decreased knowledge of use of DME, decreased mobility, difficulty walking, decreased ROM, decreased strength, increased edema, impaired flexibility, postural dysfunction, and pain.    ACTIVITY LIMITATIONS carrying, lifting, bending, sitting, standing, squatting, sleeping, stairs, transfers, and locomotion level   PARTICIPATION LIMITATIONS: meal prep, cleaning, laundry, driving, community activity, and yard work   [place], Past/current experiences, and 1-2 comorbidities: see PMH  are also affecting patient's functional outcome.    REHAB POTENTIAL: Good   CLINICAL DECISION MAKING: Stable/uncomplicated   EVALUATION COMPLEXITY: Low     GOALS: Goals reviewed with patient? Yes   SHORT TERM GOALS: Target date: 11/08/2022   Patient independent and verbalizes compliance with initial HEP Baseline: SEE OBJECTIVE DATA Goal status: MET 11/05/2022    2.  Patient reports 50% improvement in right knee pain. Baseline: SEE OBJECTIVE DATA Goal status: MET 11/05/2022   3.  PROM right knee extension -3* to flexion 105* Baseline: SEE OBJECTIVE DATA Goal status: MET 11/07/2022   LONG TERM GOALS: Target date: 12/06/2022    Patient will improve FOTO score to 54% Baseline: SEE OBJECTIVE DATA Goal status: INITIAL   2.  Patient reports right knee pain </= 2/10 with standing & gait activities.  Baseline: SEE OBJECTIVE DATA Goal status: INITIAL   3.  Right Knee PROM 0* extension to 115* flexion Baseline: SEE OBJECTIVE  DATA Goal status: INITIAL   4.  Right Knee AROM seated -3* extension to 110* flexion Baseline: SEE OBJECTIVE DATA Goal status: INITIAL   5.  Patient ambulates >300' community distances including negotiating ramps, curbs & stairs with LRAD independently. Baseline: SEE OBJECTIVE DATA Goal status: INITIAL     PLAN: PT FREQUENCY: 2x/week   PT DURATION: 8 weeks   PLANNED INTERVENTIONS: Therapeutic exercises, Therapeutic activity, Neuromuscular re-education, Balance training, Gait training, Patient/Family education, Self Care, Joint mobilization, Stair training, DME instructions, Electrical stimulation, Cryotherapy, Moist heat, scar mobilization, Taping, Vasopneumatic device, Manual therapy, and physical performance testing   PLAN FOR NEXT SESSION:   check & update HEP for possible discharge 11/15,  continue with strengthending exercises including leg press, focus on quad strengthening    Jamey Reas, PT, DPT 11/07/2022, 9:19 AM

## 2022-11-11 ENCOUNTER — Encounter: Payer: Self-pay | Admitting: Physical Therapy

## 2022-11-11 ENCOUNTER — Ambulatory Visit (INDEPENDENT_AMBULATORY_CARE_PROVIDER_SITE_OTHER): Payer: Medicare Other | Admitting: Physical Therapy

## 2022-11-11 DIAGNOSIS — M6281 Muscle weakness (generalized): Secondary | ICD-10-CM

## 2022-11-11 DIAGNOSIS — M25561 Pain in right knee: Secondary | ICD-10-CM | POA: Diagnosis not present

## 2022-11-11 DIAGNOSIS — M25661 Stiffness of right knee, not elsewhere classified: Secondary | ICD-10-CM

## 2022-11-11 DIAGNOSIS — R2689 Other abnormalities of gait and mobility: Secondary | ICD-10-CM | POA: Diagnosis not present

## 2022-11-11 DIAGNOSIS — R2681 Unsteadiness on feet: Secondary | ICD-10-CM

## 2022-11-11 DIAGNOSIS — R600 Localized edema: Secondary | ICD-10-CM

## 2022-11-11 NOTE — Therapy (Signed)
OUTPATIENT PHYSICAL THERAPY TREATMENT NOTE DISCHARGE SUMMARY   Patient Name: Becky Murphy MRN: [id number] DOB:Sep 24, 1951, 71 y.o., female Today's Date: 11/11/2022  PCP: Leonard Downing, MD REFERRING PROVIDER: Meredith Pel, MD  END OF SESSION:   PT End of Session - 11/11/22 0925     Visit Number 9    Number of Visits 16    Date for PT Re-Evaluation 12/06/22    Authorization Type Medicare    PT Start Time 0922    PT Stop Time 1003    PT Time Calculation (min) 41 min    Activity Tolerance Patient tolerated treatment well;Patient limited by pain    Behavior During Therapy [facility] for tasks assessed/performed                  Past Medical History:  Diagnosis Date   Anemia    low iron in the past   Anxiety    pt denies   Arthritis    "back?" (11/03/2015) knees/hands (08/28/22)   Chest pain 11/02/2015   Depression    pt denies   Exertional dyspnea 01/05/2016   GERD (gastroesophageal reflux disease)    Heart murmur    History of peptic ulcer "late 1970's"   Migraine    "maybe 3-4/yr now" (11/03/2015)   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   Complex valvuloplasty including triangular resection of posterior leaflet, artificial Gore-tex neochord placement x6 and 34 mm Memo 3D ring annuloplasty via right mini thoracotomy approach with clipping of LA appendage   Thyroid nodule 01/29/2016   2.4 cm enhancing mixed cystic and solid nodule inferior left thyroid gland noted on CT angiogram   Past Surgical History:  Procedure Laterality Date   BACK SURGERY     BREAST BIOPSY Left ~  2005   BREAST LUMPECTOMY Left ~ 2005   CARDIAC CATHETERIZATION N/A 01/16/2016   Procedure: Right/Left Heart Cath and Coronary Angiography;  Surgeon: Sanda Klein, MD;  Location: [place] [facility];  Service: Cardiovascular;  Laterality: N/A;   CLIPPING OF ATRIAL APPENDAGE Left 03/06/2016   Procedure: CLIPPING OF LEFT ATRIAL APPENDAGE using a 89 PRO2 AtriClip;  Surgeon: Rexene Alberts, MD;  Location: [place];  Service: Open Heart Surgery;  Laterality: Left;   COLONOSCOPY     EUS N/A 02/20/2016   Procedure: ESOPHAGEAL ENDOSCOPIC ULTRASOUND (EUS) RADIAL;  Surgeon: Arta Silence, MD;  Location: [facility];  Service: Endoscopy;  Laterality: N/A;   EYE MUSCLE SURGERY Bilateral 1970's?   KNEE ARTHROSCOPY Right 04/04/2020   Procedure: RIGHT KNEE ARTHROSCOPY WITH DEBRIDEMENT;  Surgeon: Meredith Pel, MD;  Location: [place];  Service: Orthopedics;  Laterality: Right;   KNEE ARTHROSCOPY Right 04/10/2021   Procedure: right knee arthroscopy, debridement, placement of antibiotic beads;  Surgeon: Meredith Pel, MD;  Location: [place];  Service: Orthopedics;  Laterality: Right;   KNEE ARTHROSCOPY Left 12/10/2021   Procedure: LEFT KNEE ARTHROSCOPY, DEBRIDEMENT, PLACEMENT OF STIMULAN BEADS;  Surgeon: Meredith Pel, MD;  Location: [place];  Service: Orthopedics;  Laterality: Left;   [place]   "trimmed bulges off both sides"  MITRAL VALVE REPAIR Right 03/06/2016   Procedure: MINIMALLY INVASIVE MITRAL VALVE REPAIR (MVR) using a 34 Sorin Memo 3D Ring;  Surgeon: Rexene Alberts, MD;  Location: [place];  Service: Open Heart Surgery;  Laterality: Right;   TEE WITHOUT CARDIOVERSION N/A 01/05/2016   Procedure: TRANSESOPHAGEAL ECHOCARDIOGRAM (TEE);  Surgeon: Sanda Klein, MD;  Location: [facility];  Service: Cardiovascular;  Laterality: N/A;   TEE WITHOUT CARDIOVERSION N/A 03/06/2016   Procedure: TRANSESOPHAGEAL ECHOCARDIOGRAM (TEE);  Surgeon: Rexene Alberts, MD;  Location: [place];  Service: Open  Heart Surgery;  Laterality: N/A;   TOTAL KNEE ARTHROPLASTY Right 09/03/2022   Procedure: RIGHT TOTAL KNEE ARTHROPLASTY-CEMENTED;  Surgeon: Meredith Pel, MD;  Location: [place];  Service: Orthopedics;  Laterality: Right;   TUBAL LIGATION     Patient Active Problem List   Diagnosis Date Noted   Arthritis of knee    Status post right knee replacement 09/03/2022   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   High risk medication use 04/17/2022   Vitamin B 12 deficiency 04/03/2022   Iron deficiency anemia 03/29/2022   Bilateral wrist pain 02/27/2022   Hemarthrosis 02/08/2022   Synovitis of left knee 07/19/2021   Vancomycin adverse reaction 04/19/2021   Therapeutic drug monitoring 04/19/2021   Synovitis of right knee 04/04/2020   Long term (current) use of anticoagulants [Z79.01] 03/14/2016   S/P minimally invasive mitral valve repair 03/06/2016   Pancreatic mass 01/29/2016   Thyroid nodule 01/29/2016   Exertional dyspnea 01/05/2016   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   Mitral insufficiency    Mitral valve prolapse 12/26/2015   Anxiety 11/02/2015    REFERRING DIAG: Z96.651 (ICD-10-CM) - Status post total right knee replacement  ONSET DATE: 09/03/2022  THERAPY DIAG:  Acute pain of right knee  Stiffness of right knee, not elsewhere classified  Muscle weakness (generalized)  Other abnormalities of gait and mobility  Unsteadiness on feet  Localized edema  Rationale for Evaluation and Treatment Rehabilitation  PERTINENT HISTORY: Right TKA 09/03/22, Left Knee Arthroscopy 12/10/21, Seronegative Inflammatory Arthritis, MVP with repair, anxiety, pancreatic mass, back sg, TEE cardioversion  PRECAUTIONS: Fall  SUBJECTIVE:                                                                                                                                                                                      SUBJECTIVE STATEMENT:   doing well; no complaints.  Wants  to wrap up today if possible.   PAIN:  Are you having pain? Yes: NPRS scale:   RLE 1/10  today & since last PT lowest 0/10 highest 2/10 Pain location: both knees (right sides & back;  left on sides) Pain description: throbbing Aggravating factors: exercising like lifting leg for RLE & walking for LLE Relieving factors: aspirin at night, ice, Icy-Hot cream at night  OBJECTIVE: (objective measures completed at initial evaluation unless otherwise dated)   PATIENT SURVEYS:  10/14/2022:   FOTO 40% with target 54% 11/11/22: FOTO 73   COGNITION: 10/14/2022:   Overall cognitive status: Within functional limits for tasks assessed                 EDEMA:  10/14/2022:   RLE:  above knee 40.5cm  around knee  39.5cm  below knee  32.5cm  10/14/2022:   LLE:  above knee 37cm  around knee  37cm below knee 29cm   PALPATION: Right Knee Tenderness at patella tendon, hamstring tendons & medial /lateral joint line.  Denies tenderness at quad tendon, quadriceps or hamstring muscle bellies.   LOWER EXTREMITY ROM:   ROM P:passive  A:active Right eval Left eval Right 10/21/22 Right 10/29/22 Right 11/07/22 Right 11/11/22  Hip flexion          Hip extension          Hip abduction          Hip adduction          Hip internal rotation          Hip external rotation          Knee flexion Seated P: 104* Seated P: 116* Supine A: 128* A: 134 Seated P: 135* Supine  A;135  Knee extension Seated P: -17* A: -26* Seated P: -6* A: -24* Seated  LAQ A: -19* Supine Quad set  A: -4* A: -5 (seated LAQ) Seated A: LAQ -5* P: 0* Seated A: LAQ -2* P: 0*  Ankle dorsiflexion          Ankle plantarflexion          Ankle inversion          Ankle eversion           (Blank rows = not tested)   LOWER EXTREMITY MMT:   MMT Right eval Left eval  Hip flexion      Hip extension      Hip abduction      Hip adduction      Hip internal rotation      Hip external rotation      Knee flexion 3-/5 3-/5   Knee extension 3-/5 3-/5  Ankle dorsiflexion      Ankle plantarflexion      Ankle inversion      Ankle eversion       (Blank rows = not tested)     GAIT: 10/14/2022:   Distance walked: 100' Assistive device utilized: Environmental consultant - 2 wheeled Level of assistance: SBA Comments: BLE flexed knees in stance with trunk flexion, decreased step length BLEs, step-to pattern leading with LLE, excessive weight bearing on RW       TODAY'S TREATMENT: 11/11/22 Therapeutic Exercise: Precor bike seat 6 level 2 LEs only x 8 min Leg Press bil 2x15  81#; single limb with ea LE 37# 3x10 ROM measurements as noted above LAQ 2x10 bil; 4#  Seated SLR x 10 reps bil   11/07/2022 Therapeutic Exercise: Precor bike seat 6 level 1 LEs only x 8 min Gastroc stretch incline board 2 reps x 30 sec 1st rep BLE, 2nd rep straddle step RLE Leg Press bil 2x15  81#; single limb with ea LE 37# 15 reps   Heel  raises on leg press machine 81# BLEs 15 reps 1 sets Hamstring stretch passive during rests on leg press 30 sec hold 2 reps ea LE Sit to/from stand from 24" bar stool without UEs 15 reps LAQ 5# 2x15; performed BLEs Seated hamstring curl green theraband 10 reps 2 sets BLEs Standing RW support  terminal knee ext green theraband 10 reps, 1st set TKE RLE, 2nd set RLE TKE SLS lifting LLE 3 sec & vice versa for LLE Standing heel raises LUE on counter reaching RUE to top shelf of cabinet 10 reps Standing alternating LEs hip abd 10 reps with focus stance knee ext / control Standing alternating LEs hip ext 10 reps with focus stance knee ext / control  Therapeutic Activities: PT demo & verbal cues on using 24" bar stool for intermittent rest / use with standing activities to decrease prolonged standing that increases her pain. Pt verbalized & return demo understanding including safe sit/stand  11/05/2022 Therapeutic Exercise: Precor bike seat 6 level 2 LEs only x 8 min Gastroc stretch step heel depression 2 reps x 30 sec ea  LE Leg Press bil 2x15  75#; single limb with ea LE 31# 2 x10  Heel raises on leg press machine 75# BLEs 15 reps 1 sets LAQ 5# 2x15; performed BLEs Seated hamstring curl green theraband 10 reps 2 sets RLE Standing RW support  terminal knee ext green theraband 10 reps, 1st set TKE RLE, 2nd set RLE TKE SLS lifting LLE 3 sec Standing alternating LEs hip abd 10 reps with focus stance knee ext / control Standing alternating LEs hip ext 10 reps with focus stance knee ext / control  12/31/2021 Therapeutic Exercise: Precor bike seat 6 level 1 LEs only x 8 min Gastroc stretch step heel depression 2 reps x 30 sec ea LE Leg Press bil 2x15  62#; single limb with ea LE 31# 2 x10  Heel raises on leg press machine 68# BLEs 10 reps 2 sets Bridges with hip flex so single leg stance stabilization alternating LEs 10 reps ea. LE LAQ 5# 2x15; performed BLEs     PATIENT EDUCATION:  Education details: see HEP below Person educated: Patient Education method: Explanation, Demonstration, Verbal cues, and Handouts Education comprehension: verbalized understanding, returned demonstration, verbal cues required, and needs further education     HOME EXERCISE PROGRAM: Access Code: [email or web address] URL: [email or web address] Date: 10/14/2022 Prepared by: Jamey Reas   Exercises - Ankle Alphabet in Elevation  - 2-4 x daily - 7 x weekly - 1 sets - 1 reps - Quad Setting and Stretching  - 2-4 x daily - 7 x weekly - 5-10 sets - 10 reps - prop 5-10 minutes & quad set5 seconds hold - Supine Leg Press  - 2-4 x daily - 7 x weekly - 5-10 sets - 10 reps - 5 seconds hold - Supine Heel Slide with Strap  - 2-3 x daily - 7 x weekly - 2-3 sets - 10 reps - 5 seconds hold - Supine Straight Leg Raises  - 2-3 x daily - 7 x weekly - 2-3 sets - 10 reps - 5 seconds hold - Seated Knee Flexion Extension AROM   - 2-4 x daily - 7 x weekly - 2-3 sets - 10 reps - 5 seconds hold - Seated straight leg lifts  - 2-3 x daily - 7 x weekly  - 2-3 sets - 10 reps - 5 seconds hold - Seated Long Arc Quad  - 2-3 x daily - 7 x  weekly - 2-3 sets - 10 reps - 5 seconds hold - Seated Hamstring Stretch with Strap  - 2-4 x daily - 7 x weekly - 1 sets - 3 reps - 20-30 seconds hold   CLINICAL IMPRESSION: Pt has met all goals for her Rt knee at this time.  Ambulation goal only partially met largely due to Lt knee which she hopes to have TKA soon.  Will d/c PT today.   OBJECTIVE IMPAIRMENTS Abnormal gait, decreased activity tolerance, decreased balance, decreased endurance, decreased knowledge of condition, decreased knowledge of use of DME, decreased mobility, difficulty walking, decreased ROM, decreased strength, increased edema, impaired flexibility, postural dysfunction, and pain.    ACTIVITY LIMITATIONS carrying, lifting, bending, sitting, standing, squatting, sleeping, stairs, transfers, and locomotion level   PARTICIPATION LIMITATIONS: meal prep, cleaning, laundry, driving, community activity, and yard work   [place], Past/current experiences, and 1-2 comorbidities: see PMH  are also affecting patient's functional outcome.    REHAB POTENTIAL: Good   CLINICAL DECISION MAKING: Stable/uncomplicated   EVALUATION COMPLEXITY: Low     GOALS: Goals reviewed with patient? Yes   SHORT TERM GOALS: Target date: 11/08/2022   Patient independent and verbalizes compliance with initial HEP Baseline: SEE OBJECTIVE DATA Goal status: MET 11/05/2022    2.  Patient reports 50% improvement in right knee pain. Baseline: SEE OBJECTIVE DATA Goal status: MET 11/05/2022   3.  PROM right knee extension -3* to flexion 105* Baseline: SEE OBJECTIVE DATA Goal status: MET 11/07/2022   LONG TERM GOALS: Target date: 12/06/2022    Patient will improve FOTO score to 54% Baseline: SEE OBJECTIVE DATA Goal status: MET 11/11/22   2.  Patient reports right knee pain </= 2/10 with standing & gait activities.  Baseline: SEE OBJECTIVE DATA Goal  status: MET 11/11/22   3.  Right Knee PROM 0* extension to 115* flexion Baseline: SEE OBJECTIVE DATA Goal status: MET 11/11/22   4.  Right Knee AROM seated -3* extension to 110* flexion Baseline: SEE OBJECTIVE DATA Goal status: MET 11/11/22   5.  Patient ambulates >300' community distances including negotiating ramps, curbs & stairs with LRAD independently. Baseline: SEE OBJECTIVE DATA Goal status: Partially Met 11/11/22 (due to Lt knee pain)     PLAN: PT FREQUENCY: 2x/week   PT DURATION: 8 weeks   PLANNED INTERVENTIONS: Therapeutic exercises, Therapeutic activity, Neuromuscular re-education, Balance training, Gait training, Patient/Family education, Self Care, Joint mobilization, Stair training, DME instructions, Electrical stimulation, Cryotherapy, Moist heat, scar mobilization, Taping, Vasopneumatic device, Manual therapy, and physical performance testing   PLAN FOR NEXT SESSION:  d/c PT today   Faustino Congress, PT, DPT 11/11/2022, 10:06 AM   PHYSICAL THERAPY DISCHARGE SUMMARY  Visits from Start of Care: 9  Current functional level related to goals / functional outcomes: See above   Remaining deficits: See above   Education / Equipment: HEP   Patient agrees to discharge. Patient goals were met. Patient is being discharged due to meeting the stated rehab goals.   Ulyess Blossom  [facility] [address] [place], [place], [address] Phone: [phone]   Fax:  [phone]

## 2022-11-13 ENCOUNTER — Encounter: Payer: Medicare Other | Admitting: Physical Therapy

## 2022-11-13 ENCOUNTER — Inpatient Hospital Stay: Payer: Medicare Other

## 2022-11-20 ENCOUNTER — Ambulatory Visit (INDEPENDENT_AMBULATORY_CARE_PROVIDER_SITE_OTHER): Payer: Medicare Other | Admitting: Surgical

## 2022-11-20 DIAGNOSIS — Z96651 Presence of right artificial knee joint: Secondary | ICD-10-CM

## 2022-11-22 ENCOUNTER — Encounter: Payer: Self-pay | Admitting: Orthopedic Surgery

## 2022-11-22 NOTE — Progress Notes (Signed)
Post-Op Visit Note   Patient: Becky Murphy           Date of Birth: Jan 14, 1951           MRN: [id number] Visit Date: 11/20/2022 PCP: Kaleen Mask, MD   Assessment & Plan:  Chief Complaint:  Chief Complaint  Patient presents with   Right Knee - Routine Post Op    09/03/22 (11w 1d)Total Knee Arthroplasty-cemented - Right     Visit Diagnoses:  1. Status post total right knee replacement     Plan: Patient is a 71 year old female who presents s/p right total knee arthroplasty on 09/03/2022.  Doing well overall.  Not really having any pain at all in her right knee.  Ambulates with a walker.  Still going to outpatient physical therapy and has 9 sessions remaining.  She denies any fevers, chills, drainage from her incision.  No calf pain, chest pain, shortness of breath.  She states the main thing that is holding her back is her left knee now.  She would like to proceed with left knee replacement as soon as possible.  On exam, patient has incision that is well-healed without evidence of infection or dehiscence.  She has range of motion from 0 degrees extension to 125 degrees of knee flexion.  No calf tenderness.  Negative Homans' sign.  She is able to perform straight leg raise without extensor lag.  Stable to varus valgus stress at 0 and 30 degrees.  No gross mid flexion instability noted.  She has no significant effusion.  No pain with passive motion of the knee.  Left knee today without effusion.  Palpable DP pulse.  Intact extensor mechanism.  No cellulitis or skin changes noted.  Left knee with about 15 degrees flexion contracture and 120 degrees of knee flexion  Impression is right knee that is doing very well following knee replacement.  Knee feels stable and has excellent range of motion.  There is no effusion or any evidence of infection.  She is now 3 months out almost from that procedure and she would like to proceed with left knee replacement by the end of the year.  She  is already much more mobile than she was prior to surgery as she had to use a wheelchair for ambulation leading into surgery.  The risks and benefits of the procedure were again reviewed with the patient and her husband including but not limited to the risk of nerve/blood vessel damage, knee stiffness, knee instability, prosthetic joint infection, need for revision surgery in the future, medical complication such as DVT/PE/MI/stroke/death.  She would still like to proceed with surgery.  Plan for sometime around mid December.  Follow-up after procedure.  Follow-Up Instructions: No follow-ups on file.   Orders:  No orders of the defined types were placed in this encounter.  No orders of the defined types were placed in this encounter.   Imaging: No results found.  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   High risk medication use 04/17/2022   Vitamin B 12 deficiency 04/03/2022   Iron deficiency anemia 03/29/2022   Bilateral wrist pain 02/27/2022   Hemarthrosis 02/08/2022   Synovitis of left knee 07/19/2021   Vancomycin adverse reaction 04/19/2021   Therapeutic drug monitoring 04/19/2021   Synovitis of right knee 04/04/2020   Long term (current) use of  anticoagulants [Z79.01] 03/14/2016   S/P minimally invasive mitral valve repair 03/06/2016   Pancreatic mass 01/29/2016   Thyroid nodule 01/29/2016   Exertional dyspnea 01/05/2016   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   Mitral insufficiency    Mitral valve prolapse 12/26/2015   Anxiety 11/02/2015   Past Medical History:  Diagnosis Date   Anemia    low iron in the past   Anxiety    pt denies   Arthritis    "back?" (11/03/2015) knees/hands (08/28/22)   Chest pain 11/02/2015   Depression    pt denies   Exertional dyspnea 01/05/2016   GERD (gastroesophageal reflux  disease)    Heart murmur    History of peptic ulcer "late 1970's"   Migraine    "maybe 3-4/yr now" (11/03/2015)   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   Complex valvuloplasty including triangular resection of posterior leaflet, artificial Gore-tex neochord placement x6 and 34 mm Memo 3D ring annuloplasty via right mini thoracotomy approach with clipping of LA appendage   Thyroid nodule 01/29/2016   2.4 cm enhancing mixed cystic and solid nodule inferior left thyroid gland noted on CT angiogram    Family History  Problem Relation Age of Onset   Hypertension Mother    Cancer Mother 44       MELANOMA   Cancer Father        PROSTATE   Cancer Sister 61       BREAST    Past Surgical History:  Procedure Laterality Date   BACK SURGERY     BREAST BIOPSY Left ~ 2005   BREAST LUMPECTOMY Left ~ 2005   CARDIAC CATHETERIZATION N/A 01/16/2016   Procedure: Right/Left Heart Cath and Coronary Angiography;  Surgeon: Thurmon Fair, MD;  Location: [facility];  Service: Cardiovascular;  Laterality: N/A;   CLIPPING OF ATRIAL APPENDAGE Left 03/06/2016   Procedure: CLIPPING OF LEFT ATRIAL APPENDAGE using a 45 PRO2 AtriClip;  Surgeon: Purcell Nails, MD;  Location: [facility];  Service: Open Heart Surgery;  Laterality: Left;   COLONOSCOPY     EUS N/A 02/20/2016   Procedure: ESOPHAGEAL ENDOSCOPIC ULTRASOUND (EUS) RADIAL;  Surgeon: Willis Modena, MD;  Location: [facility];  Service: Endoscopy;  Laterality: N/A;   EYE MUSCLE SURGERY Bilateral 1970's?   KNEE ARTHROSCOPY Right 04/04/2020   Procedure: RIGHT KNEE ARTHROSCOPY  WITH DEBRIDEMENT;  Surgeon: Cammy Copa, MD;  Location: [facility] OR;  Service: Orthopedics;  Laterality: Right;   KNEE ARTHROSCOPY Right 04/10/2021   Procedure: right knee arthroscopy, debridement, placement of antibiotic beads;  Surgeon: Cammy Copa, MD;  Location: [facility] OR;  Service: Orthopedics;  Laterality: Right;   KNEE ARTHROSCOPY Left 12/10/2021   Procedure: LEFT KNEE ARTHROSCOPY, DEBRIDEMENT, PLACEMENT OF STIMULAN BEADS;  Surgeon: Cammy Copa, MD;  Location: [facility];  Service: Orthopedics;  Laterality: Left;   LUMBAR DISC SURGERY  1992   "trimmed bulges off both sides"   MITRAL VALVE REPAIR Right 03/06/2016   Procedure: MINIMALLY INVASIVE MITRAL VALVE REPAIR (MVR) using a 34 Sorin Memo 3D Ring;  Surgeon: Purcell Nails, MD;  Location:  [facility];  Service: Open Heart Surgery;  Laterality: Right;   TEE WITHOUT CARDIOVERSION N/A 01/05/2016   Procedure: TRANSESOPHAGEAL ECHOCARDIOGRAM (TEE);  Surgeon: Thurmon Fair, MD;  Location: [facility];  Service: Cardiovascular;  Laterality: N/A;   TEE WITHOUT CARDIOVERSION N/A 03/06/2016   Procedure: TRANSESOPHAGEAL ECHOCARDIOGRAM (TEE);  Surgeon: Purcell Nails, MD;  Location: [facility] OR;  Service: Open Heart Surgery;  Laterality: N/A;   TOTAL KNEE ARTHROPLASTY Right 09/03/2022   Procedure: RIGHT TOTAL KNEE ARTHROPLASTY-CEMENTED;  Surgeon: Cammy Copa, MD;  Location: [facility] OR;  Service: Orthopedics;  Laterality: Right;   TUBAL LIGATION     Social History   Occupational History   Not on file  Tobacco Use   Smoking status: Never    Passive exposure: Current   Smokeless tobacco: Never  Vaping Use   Vaping Use: Never used  Substance and Sexual Activity   Alcohol use: Yes    Alcohol/week: 10.0 standard drinks of alcohol    Types: 6 Glasses of wine, 4 Shots of liquor per week    Comment: wine every other day   Drug use: No   Sexual activity: Not Currently    Birth control/protection: Post-menopausal

## 2022-11-25 ENCOUNTER — Other Ambulatory Visit: Payer: Self-pay

## 2022-11-28 ENCOUNTER — Encounter: Payer: Self-pay | Admitting: Internal Medicine

## 2022-11-28 ENCOUNTER — Ambulatory Visit: Payer: Medicare Other | Attending: Internal Medicine | Admitting: Internal Medicine

## 2022-11-28 VITALS — BP 143/98 | HR 80 | Resp 17 | Ht 66.0 in | Wt 149.0 lb

## 2022-11-28 DIAGNOSIS — Z79899 Other long term (current) drug therapy: Secondary | ICD-10-CM | POA: Diagnosis present

## 2022-11-28 DIAGNOSIS — M1710 Unilateral primary osteoarthritis, unspecified knee: Secondary | ICD-10-CM | POA: Diagnosis present

## 2022-11-28 DIAGNOSIS — M1380 Other specified arthritis, unspecified site: Secondary | ICD-10-CM | POA: Diagnosis present

## 2022-11-28 MED ORDER — SULFASALAZINE 500 MG PO TABS: 1000.0000 mg | ORAL_TABLET | Freq: Three times a day (TID) | ORAL | 2 refills | Status: DC

## 2022-11-28 NOTE — Progress Notes (Signed)
Office Visit Note  Patient: Becky Murphy             Date of Birth: 1951-07-18           MRN: [id number]             PCP: Kaleen MaskElkins, Wilson Oliver, MD Referring: Kaleen MaskElkins, Wilson Oliver, * Visit Date: 11/28/2022   Subjective:  Follow-up (Total Knee Replacement Surgery 12/12/2022)   History of Present Illness: Becky Murphy is a 71 y.o. 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  She has been doing quite well recently she has completed her course of formal physical therapy for right knee replacement surgery with good strength and mobility not having a lot of pain.  There is still a small amount of swelling in the knee.  She is now scheduled for knee replacement surgery on the left side December 14.  This with her hands are doing well.  Previous HPI 08/14/22 Becky Murphy is a 71 y.o. female here for follow up or follow up for seronegative inflammatory arthritis. She is scheduled for right knee arthroplasty 09/03/22 with Dr. August Saucerean. She has continued feeling a good improvement in joint pains on continued SSZ 1000 mg TID. She has not had any major intolerance or infections or drug reactions. Does continue to have knee swelling and limited mobility.   Previous HPI 02/08/22 Becky Murphy is a 71 y.o. female here for evaluation of recurrent bilateral knee effusions. These started about 2 years ago affecting the right knee. She has had repeated episodes of suspected infectious knee effusions with negative cultures. Treatment has included drainage, with subsequent surgical debridement twice in the right knee which improved symptoms a lot but never entirely to normal. Medications started with initial vancomycin treatment in April 2021. She suffered a reaction with rash developing that persists on her outer legs. Repeat antibiotics afterwards and again in April 2022 after additional surgery. She developed left knee effusion initially treated  with serial aspiration and then left knee arthroscopy and debridement in December. MRI imaging of the knee and thigh demonstrated significant synovitis and possible hemarthrosis. Currently her left knee is swollen and hot with pain and decreased range of movement. She is ambulatory with a walker at home and using wheelchair for long distance travel. She has noticed significant loss of upper leg strength throughout this process. In addition to knee symptoms she has developed bilateral wrist nodules and pain she attributes to pressure having to transfer and stand using upper body due to leg pain and weakness. She has no past arthritis history before this started about 2 years ago. She has psoriasis typically affecting her upper torso not severe and not on long term medication for this. 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 She had mitral valve replacement by thoracotomy in 2017 without infection or inflammatory pathology noted.    Synopvial fluid repeat aspirations 02/2021-10/2021 WBCs 8000-45000 Neutrophils 58-95% Crystals negative Microbiology negative   11/30/2021 MRI left femur IMPRESSION: Large heterogeneous left knee joint effusion, partially visualized, with thick synovial enhancement suggestive of synovitis, possibly hemarthrosis. Intramuscular edema within the distal vastus medialis muscle, likely reactive. If clinically indicated, a dedicated left knee MRI may be useful for further evaluation.   Review of Systems  Constitutional:  Negative for fatigue.  HENT:  Negative for mouth sores and mouth dryness.   Eyes:  Negative for  dryness.  Respiratory:  Negative for shortness of breath.   Cardiovascular:  Negative for chest pain and palpitations.  Gastrointestinal:  Negative for blood in stool, constipation and diarrhea.  Endocrine: Negative for increased urination.  Genitourinary:  Negative for involuntary  urination.  Musculoskeletal:  Positive for joint pain, joint pain, joint swelling, muscle weakness and morning stiffness. Negative for gait problem, myalgias, muscle tenderness and myalgias.  Skin:  Negative for color change, rash, hair loss and sensitivity to sunlight.  Allergic/Immunologic: Negative for susceptible to infections.  Neurological:  Negative for dizziness and headaches.  Hematological:  Negative for swollen glands.  Psychiatric/Behavioral:  Negative for depressed mood and sleep disturbance. The patient is not nervous/anxious.     PMFS History:  Patient Active Problem List   Diagnosis Date Noted   Arthritis of knee    Status post right knee replacement 09/03/2022   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   High risk medication use 04/17/2022   Vitamin B 12 deficiency 04/03/2022   Iron deficiency anemia 03/29/2022   Bilateral wrist pain 02/27/2022   Hemarthrosis 02/08/2022   Synovitis of left knee 07/19/2021   Vancomycin adverse reaction 04/19/2021   Therapeutic drug monitoring 04/19/2021   Synovitis of right knee 04/04/2020   Long term (current) use of anticoagulants [Z79.01] 03/14/2016   S/P minimally invasive mitral valve repair 03/06/2016   Pancreatic mass 01/29/2016   Thyroid nodule 01/29/2016   Exertional dyspnea 01/05/2016   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   Mitral insufficiency    Mitral valve prolapse 12/26/2015   Anxiety 11/02/2015    Past Medical History:  Diagnosis Date   Anemia    low iron in the past   Anxiety    pt denies   Arthritis    "back?" (11/03/2015) knees/hands (08/28/22)   Chest pain 11/02/2015   Depression    pt denies   Exertional dyspnea 01/05/2016   GERD (gastroesophageal reflux disease)    Heart murmur    History of peptic ulcer "late 1970's"   Migraine    "maybe 3-4/yr now" (11/03/2015)   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   Complex valvuloplasty including triangular resection of posterior leaflet, artificial Gore-tex neochord placement x6 and 34 mm Memo 3D ring annuloplasty via right mini thoracotomy approach with clipping of LA appendage   Thyroid nodule 01/29/2016   2.4 cm enhancing mixed cystic and solid nodule inferior left thyroid gland noted on CT angiogram    Family History  Problem Relation Age of Onset   Hypertension Mother    Cancer Mother 49       MELANOMA   Cancer Father        PROSTATE   Cancer Sister 73       BREAST   Past Surgical History:  Procedure Laterality Date   BACK SURGERY     BREAST BIOPSY Left ~ 2005   BREAST LUMPECTOMY Left ~ 2005   CARDIAC CATHETERIZATION N/A 01/16/2016   Procedure: Right/Left Heart Cath and Coronary Angiography;  Surgeon: Thurmon Fair, MD;  Location: [facility] INVASIVE [facility];  Service: Cardiovascular;  Laterality: N/A;  CLIPPING OF ATRIAL APPENDAGE Left 03/06/2016   Procedure: CLIPPING OF LEFT ATRIAL APPENDAGE using a 45 PRO2 AtriClip;  Surgeon: Purcell Nails, MD;  Location: [facility] OR;  Service: Open Heart Surgery;  Laterality: Left;   COLONOSCOPY     EUS N/A 02/20/2016   Procedure: ESOPHAGEAL ENDOSCOPIC ULTRASOUND (EUS) RADIAL;  Surgeon: Willis Modena, MD;  Location: [facility] ENDOSCOPY;  Service: Endoscopy;  Laterality: N/A;   EYE MUSCLE SURGERY Bilateral 1970's?   KNEE ARTHROSCOPY Right 04/04/2020   Procedure: RIGHT KNEE ARTHROSCOPY WITH DEBRIDEMENT;  Surgeon: Cammy Copa, MD;  Location: [facility] OR;  Service: Orthopedics;  Laterality: Right;   KNEE ARTHROSCOPY Right 04/10/2021   Procedure: right knee arthroscopy, debridement, placement of antibiotic  beads;  Surgeon: Cammy Copa, MD;  Location: [facility] OR;  Service: Orthopedics;  Laterality: Right;   KNEE ARTHROSCOPY Left 12/10/2021   Procedure: LEFT KNEE ARTHROSCOPY, DEBRIDEMENT, PLACEMENT OF STIMULAN BEADS;  Surgeon: Cammy Copa, MD;  Location: [facility] OR;  Service: Orthopedics;  Laterality: Left;   LUMBAR DISC SURGERY  1992   "trimmed bulges off both sides"   MITRAL VALVE REPAIR Right 03/06/2016   Procedure: MINIMALLY INVASIVE MITRAL VALVE REPAIR (MVR) using a 34 Sorin Memo 3D Ring;  Surgeon: Purcell Nails, MD;  Location: [facility] OR;  Service: Open Heart Surgery;  Laterality: Right;   TEE WITHOUT CARDIOVERSION N/A 01/05/2016   Procedure: TRANSESOPHAGEAL ECHOCARDIOGRAM (TEE);  Surgeon: Thurmon Fair, MD;  Location: [facility] ENDOSCOPY;  Service: Cardiovascular;  Laterality: N/A;   TEE WITHOUT CARDIOVERSION N/A 03/06/2016   Procedure: TRANSESOPHAGEAL ECHOCARDIOGRAM (TEE);  Surgeon: Purcell Nails, MD;  Location: [facility] OR;  Service: Open Heart Surgery;  Laterality: N/A;   TOTAL KNEE ARTHROPLASTY Right 09/03/2022   Procedure: RIGHT TOTAL KNEE ARTHROPLASTY-CEMENTED;  Surgeon: Cammy Copa, MD;  Location: [facility] OR;  Service: Orthopedics;  Laterality: Right;   TUBAL LIGATION     Social History   Social History Narrative   Not on file    There is no immunization history on file for this patient.   Objective: Vital Signs: BP (!) 143/98 (BP Location: Left Arm, Patient Position: Sitting, Cuff Size: Normal)   Pulse 80   Resp 17   Ht 5\' 6"  (1.676 m)   Wt 149 lb (67.6 kg)   BMI 24.05 kg/m    Physical Exam Cardiovascular:     Rate and Rhythm: Normal rate and regular rhythm.  Pulmonary:     Effort: Pulmonary effort is normal.     Breath sounds: Normal breath sounds.  Musculoskeletal:     Right lower leg: No edema.     Left lower leg: No edema.  Skin:    General: Skin is warm and dry.  Neurological:     Mental Status: She is alert.  Psychiatric:        Mood and Affect: Mood normal.       Musculoskeletal Exam:  Shoulders full ROM no tenderness or swelling Elbows full ROM no tenderness or swelling Wrists full ROM no tenderness or swelling Fingers full ROM no tenderness or swelling Right knee well-healed surgical scar small effusion present extension range of motion is full flexion about 120 degrees range of motion, left knee with tenderness to pressure and mild swelling present decreased range of motion   CDAI Exam: CDAI Score: 5  Patient Global: 10 mm; Provider Global: 10 mm Swollen: 2 ; Tender: 1  Joint Exam 11/28/2022      Right  Left  Knee  Swollen  Swollen Tender     Investigation: No additional findings.  Imaging: No results found.  Recent Labs: Lab Results  Component Value Date   WBC 4.8 11/28/2022   HGB 15.3 11/28/2022   PLT 192 11/28/2022   NA 142 08/28/2022   K 3.9 08/28/2022   CL 108 08/28/2022   CO2 24 08/28/2022   GLUCOSE 100 (H) 08/28/2022   BUN 10 08/28/2022   CREATININE 0.72 08/28/2022   BILITOT 0.3 08/14/2022   ALKPHOS 100 03/29/2022   AST 17 08/14/2022   ALT 13 08/14/2022   PROT 6.0 (L) 08/14/2022   ALBUMIN 3.8 03/29/2022   CALCIUM 9.3 08/28/2022   GFRAA >60 04/04/2020   QFTBGOLDPLUS NEGATIVE 02/08/2022    Speciality Comments: No specialty comments available.  Procedures:  No procedures performed Allergies: Vancomycin and Cyanocobalamin [vitamin b12]   Assessment / Plan:     Visit Diagnoses: Seronegative inflammatory arthritis  Arthritis of knee - Plan: C-reactive protein, sulfaSALAzine (AZULFIDINE) 500 MG tablet  Symptoms appear well-controlled I think the continued knee effusions are related to the underlying arthritis and surgery.  Checking CRP for disease activity monitoring.  Will plan to continue sulfasalazine 1000 mg TID.  High risk medication use - Plan: CBC with Differential/Platelet, COMPLETE METABOLIC PANEL WITH GFR  Checking CBC and CMP for medication monitoring continuing sulfasalazine.  No interval  intolerance or complications.  Orders: Orders Placed This Encounter  Procedures   C-reactive protein   CBC with Differential/Platelet   COMPLETE METABOLIC PANEL WITH GFR   Meds ordered this encounter  Medications   sulfaSALAzine (AZULFIDINE) 500 MG tablet    Sig: Take 2 tablets (1,000 mg total) by mouth 3 (three) times daily.    Dispense:  180 tablet    Refill:  2     Follow-Up Instructions: Return in about 3 months (around 02/27/2023) for RA on SSZ f/u 76mos.   Fuller Plan, MD  Note - This record has been created using [employer].  Chart creation errors have been sought, but may not always  have been located. Such creation errors do not reflect on  the standard of medical care.

## 2022-11-29 LAB — CBC WITH DIFFERENTIAL/PLATELET
Absolute Monocytes: 581 cells/uL (ref 200–950)
Basophils Absolute: 48 cells/uL (ref 0–200)
Basophils Relative: 1 %
Eosinophils Absolute: 130 cells/uL (ref 15–500)
Eosinophils Relative: 2.7 %
HCT: 45.3 % — ABNORMAL HIGH (ref 35.0–45.0)
Hemoglobin: 15.3 g/dL (ref 11.7–15.5)
Lymphs Abs: 994 cells/uL (ref 850–3900)
MCH: 30.6 pg (ref 27.0–33.0)
MCHC: 33.8 g/dL (ref 32.0–36.0)
MCV: 90.6 fL (ref 80.0–100.0)
MPV: 10.9 fL (ref 7.5–12.5)
Monocytes Relative: 12.1 %
Neutro Abs: 3048 cells/uL (ref 1500–7800)
Neutrophils Relative %: 63.5 %
Platelets: 192 10*3/uL (ref 140–400)
RBC: 5 10*6/uL (ref 3.80–5.10)
RDW: 14.3 % (ref 11.0–15.0)
Total Lymphocyte: 20.7 %
WBC: 4.8 10*3/uL (ref 3.8–10.8)

## 2022-11-29 LAB — COMPLETE METABOLIC PANEL WITH GFR
AG Ratio: 2 (calc) (ref 1.0–2.5)
ALT: 9 U/L (ref 6–29)
AST: 13 U/L (ref 10–35)
Albumin: 4.3 g/dL (ref 3.6–5.1)
Alkaline phosphatase (APISO): 105 U/L (ref 37–153)
BUN: 9 mg/dL (ref 7–25)
CO2: 25 mmol/L (ref 20–32)
Calcium: 9.2 mg/dL (ref 8.6–10.4)
Chloride: 107 mmol/L (ref 98–110)
Creat: 0.62 mg/dL (ref 0.60–1.00)
Globulin: 2.2 g/dL (calc) (ref 1.9–3.7)
Glucose, Bld: 88 mg/dL (ref 65–99)
Potassium: 3.5 mmol/L (ref 3.5–5.3)
Sodium: 144 mmol/L (ref 135–146)
Total Bilirubin: 0.4 mg/dL (ref 0.2–1.2)
Total Protein: 6.5 g/dL (ref 6.1–8.1)
eGFR: 96 mL/min/{1.73_m2} (ref >=60)

## 2022-11-29 LAB — C-REACTIVE PROTEIN: CRP: 13 mg/L — ABNORMAL HIGH (ref <8.0)

## 2022-12-05 NOTE — Pre-Procedure Instructions (Signed)
Surgical Instructions    Your procedure is scheduled on Thursday, December 14th.  Report to [facility] Main Entrance "A" at 1:15 P.M., then check in with the Admitting office.  Call this number if you have problems the morning of surgery:  [phone]   If you have any questions prior to your surgery date call [phone]: Open Monday-Friday 8am-4pm    Remember:  Do not eat after midnight the night before your surgery  You may drink clear liquids until 12:15 PM the day of your surgery.   Clear liquids allowed are: Water, Non-Citrus Juices (without pulp), Carbonated Beverages, Clear Tea, Black Coffee Only (NO MILK, CREAM OR POWDERED CREAMER of any kind), and Gatorade.   Patient Instructions  The night before surgery:  No food after midnight. ONLY clear liquids after midnight  The day of surgery (if you do NOT have diabetes):  Drink ONE (1) Pre-Surgery Clear Ensure by 12:15 PM the day of surgery. Drink in one sitting. Do not sip.  This drink was given to you during your hospital  pre-op appointment visit.  Nothing else to drink after completing the  Pre-Surgery Clear Ensure.          If you have questions, please contact your surgeon's office.     Take these medicines the morning of surgery with A SIP OF WATER  sulfaSALAzine (AZULFIDINE)    If needed: diphenhydrAMINE (BENADRYL)  methocarbamol (ROBAXIN)   Follow your surgeon's instructions on when to stop Aspirin.  If no instructions were given by your surgeon then you will need to call the office to get those instructions.     As of today, STOP taking any Aleve, Naproxen, Ibuprofen, Motrin, Advil, Goody's, BC's, all herbal medications, fish oil, and all vitamins.                     Do NOT Smoke (Tobacco/Vaping) for 24 hours prior to your procedure.  If you use a CPAP at night, you may bring your mask/headgear for your overnight stay.   Contacts, glasses, piercing's, hearing aid's, dentures or partials may not be  worn into surgery, please bring cases for these belongings.    For patients admitted to the hospital, discharge time will be determined by your treatment team.   Patients discharged the day of surgery will not be allowed to drive home, and someone needs to stay with them for 24 hours.  SURGICAL WAITING ROOM VISITATION Patients having surgery or a procedure may have no more than 2 support people in the waiting area - these visitors may rotate.   Children under the age of 50 must have an adult with them who is not the patient. If the patient needs to stay at the hospital during part of their recovery, the visitor guidelines for inpatient rooms apply. Pre-op nurse will coordinate an appropriate time for 1 support person to accompany patient in pre-op.  This support person may not rotate.   Please refer to the [facility] website for the visitor guidelines for Inpatients (after your surgery is over and you are in a regular room).    Special instructions:   - Preparing For Surgery  Before surgery, you can play an important role. Because skin is not sterile, your skin needs to be as free of germs as possible. You can reduce the number of germs on your skin by washing with CHG (chlorahexidine gluconate) Soap before surgery.  CHG is an antiseptic cleaner which kills germs and bonds with  the skin to continue killing germs even after washing.    Oral Hygiene is also important to reduce your risk of infection.  Remember - BRUSH YOUR TEETH THE MORNING OF SURGERY WITH YOUR REGULAR TOOTHPASTE  Please do not use if you have an allergy to CHG or antibacterial soaps. If your skin becomes reddened/irritated stop using the CHG.  Do not shave (including legs and underarms) for at least 48 hours prior to first CHG shower. It is OK to shave your face.  Please follow these instructions carefully.   Shower the NIGHT BEFORE SURGERY and the MORNING OF SURGERY  If you chose to wash your hair, wash your  hair first as usual with your normal shampoo.  After you shampoo, rinse your hair and body thoroughly to remove the shampoo.  Use CHG Soap as you would any other liquid soap. You can apply CHG directly to the skin and wash gently with a scrungie or a clean washcloth.   Apply the CHG Soap to your body ONLY FROM THE NECK DOWN.  Do not use on open wounds or open sores. Avoid contact with your eyes, ears, mouth and genitals (private parts). Wash Face and genitals (private parts)  with your normal soap.   Wash thoroughly, paying special attention to the area where your surgery will be performed.  Thoroughly rinse your body with warm water from the neck down.  DO NOT shower/wash with your normal soap after using and rinsing off the CHG Soap.  Pat yourself dry with a CLEAN TOWEL.  Wear CLEAN PAJAMAS to bed the night before surgery  Place CLEAN SHEETS on your bed the night before your surgery  DO NOT SLEEP WITH PETS.   Day of Surgery: Take a shower with CHG soap. Do not wear jewelry or makeup Do not wear lotions, powders, perfumes, or deodorant. Do not shave 48 hours prior to surgery.   Do not bring valuables to the hospital. [facility] is not responsible for any belongings or valuables. Do not wear nail polish, gel polish, artificial nails, or any other type of covering on natural nails (fingers and toes) If you have artificial nails or gel coating that need to be removed by a nail salon, please have this removed prior to surgery. Artificial nails or gel coating may interfere with anesthesia's ability to adequately monitor your vital signs. Wear Clean/Comfortable clothing the morning of surgery Remember to brush your teeth WITH YOUR REGULAR TOOTHPASTE.   Please read over the following fact sheets that you were given.    If you received a COVID test during your pre-op visit  it is requested that you wear a mask when out in public, stay away from anyone that may not be feeling well and  notify your surgeon if you develop symptoms. If you have been in contact with anyone that has tested positive in the last 10 days please notify you surgeon.

## 2022-12-06 ENCOUNTER — Encounter (HOSPITAL_COMMUNITY): Payer: Self-pay

## 2022-12-06 ENCOUNTER — Telehealth: Payer: Self-pay | Admitting: Orthopedic Surgery

## 2022-12-06 ENCOUNTER — Encounter (HOSPITAL_COMMUNITY)
Admission: RE | Admit: 2022-12-06 | Discharge: 2022-12-06 | Disposition: A | Discharge status: Home / Self Care | Payer: Medicare Other | Source: Ambulatory Visit | Attending: Orthopedic Surgery | Admitting: Orthopedic Surgery

## 2022-12-06 ENCOUNTER — Other Ambulatory Visit: Payer: Self-pay

## 2022-12-06 VITALS — BP 134/90 | HR 82 | Temp 98.1°F | Resp 17 | Ht 66.0 in | Wt 150.7 lb

## 2022-12-06 DIAGNOSIS — Z01812 Encounter for preprocedural laboratory examination: Secondary | ICD-10-CM | POA: Insufficient documentation

## 2022-12-06 DIAGNOSIS — Z9889 Other specified postprocedural states: Secondary | ICD-10-CM | POA: Diagnosis not present

## 2022-12-06 DIAGNOSIS — Z01818 Encounter for other preprocedural examination: Secondary | ICD-10-CM

## 2022-12-06 LAB — CBC
HCT: 45.7 % (ref 36.0–46.0)
Hemoglobin: 15.3 g/dL — ABNORMAL HIGH (ref 12.0–15.0)
MCH: 30.9 pg (ref 26.0–34.0)
MCHC: 33.5 g/dL (ref 30.0–36.0)
MCV: 92.3 fL (ref 80.0–100.0)
Platelets: DECREASED 10*3/uL (ref 150–400)
RBC: 4.95 MIL/uL (ref 3.87–5.11)
RDW: 14.6 % (ref 11.5–15.5)
WBC: 4.8 10*3/uL (ref 4.0–10.5)
nRBC: 0 % (ref 0.0–0.2)

## 2022-12-06 LAB — BASIC METABOLIC PANEL
Anion gap: 10 (ref 5–15)
BUN: 10 mg/dL (ref 8–23)
CO2: 22 mmol/L (ref 22–32)
Calcium: 8.9 mg/dL (ref 8.9–10.3)
Chloride: 108 mmol/L (ref 98–111)
Creatinine, Ser: 0.69 mg/dL (ref 0.44–1.00)
GFR, Estimated: >60 mL/min (ref >60)
Glucose, Bld: 92 mg/dL (ref 70–99)
Potassium: 4.2 mmol/L (ref 3.5–5.1)
Sodium: 140 mmol/L (ref 135–145)

## 2022-12-06 LAB — URINALYSIS, COMPLETE (UACMP) WITH MICROSCOPIC
Bilirubin Urine: NEGATIVE
Glucose, UA: NEGATIVE mg/dL
Hgb urine dipstick: NEGATIVE
Ketones, ur: NEGATIVE mg/dL
Leukocytes,Ua: NEGATIVE
Nitrite: NEGATIVE
Protein, ur: NEGATIVE mg/dL
Specific Gravity, Urine: 1.006 (ref 1.005–1.030)
pH: 5 (ref 5.0–8.0)

## 2022-12-06 LAB — SURGICAL PCR SCREEN
MRSA, PCR: NEGATIVE
Staphylococcus aureus: NEGATIVE

## 2022-12-06 NOTE — Telephone Encounter (Signed)
Best to stop taking it today

## 2022-12-06 NOTE — Progress Notes (Signed)
PCP - Kaleen Mask Cardiologist - Carolan Clines  PPM/ICD - Denies  Chest x-ray - NI EKG - 08/26/22 Stress Test - "Its been a long, long time" ECHO - 07/05/16 Cardiac Cath - 01/16/16  Sleep Study - Denies  DM - Denies  Blood Thinner Instructions:Denies Aspirin Instructions:Requested patient to call Dr. Diamantina Providence office for instruction  ERAS Protcol -Yes PRE-SURGERY Ensure given   COVID TEST- NI   Anesthesia review: Yes cardiac history  Patient denies shortness of breath, fever, cough and chest pain at PAT appointment   All instructions explained to the patient, with a verbal understanding of the material. Patient agrees to go over the instructions while at home for a better understanding.  The opportunity to ask questions was provided.

## 2022-12-06 NOTE — Telephone Encounter (Signed)
IC s/w patient and advised  

## 2022-12-06 NOTE — Telephone Encounter (Signed)
Patient needs to know when to stop take aprine before her surgery.  Best number to contact [phone]

## 2022-12-06 NOTE — Progress Notes (Signed)
Patient's UA showed rare bacteria. Dr. August Saucer notified of this through staff message. Culture still in process.

## 2022-12-07 LAB — URINE CULTURE: Culture: <10000 — AB

## 2022-12-09 NOTE — Progress Notes (Signed)
Anesthesia Chart Review:  Follows cardiology for history of MVP s/p mitral valve repair 2017 and clipping of the left atrial appendage.  She reestablished with cardiologist Dr. Carolan Clines on 08/26/2022 for preop evaluation.  Per note, "Pre-Op: RCRi Class 1 Risk. She is low risk and ischemic evaluation for cardiac risk stratification is not a necessity prior to her procedure. She is acceptable cardiac risk for total knee arthroplasty. S/p MV Repair: will get FU TTE. She can have her procedure prior to the echo."  Patient underwent right TKA 9/23 without complication.   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  She is maintained on sulfasalazine.   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  Last seen 03/13/2021 with repeat imaging and was recommended to continue surveillance at that time.   Preop labs reviewed, unremarkable.   EKG 08/26/2022: NSR.  Rate 79.   TTE 07/05/2016: - Left ventricle: The cavity size was normal. Systolic function was    normal. The estimated ejection fraction was in the range of 55%    to 60%. Wall motion was normal; there were no regional wall    motion abnormalities. The study is not technically sufficient to    allow evaluation of LV diastolic function.  - Mitral valve: Post MV repair with trivial residual MR.  - Left atrium: The atrium was mildly dilated.  - Atrial septum: No defect or patent foramen ovale   Right/left heart cath 01/16/2016: IMPRESSIONS:  Moderate (possibly severe, eccentric) mitral insufficiency. Normal coronary arteries. Normal hemodynamics.   RECOMMENDATION:  Monitor closely, will eventually require referral for MV repair.    Zannie Cove [facility] Short Stay [facility]/Anesthesiology Phone [phone] 12/09/2022 2:38 PM

## 2022-12-09 NOTE — Anesthesia Preprocedure Evaluation (Signed)
Anesthesia Evaluation  Patient identified by MRN, date of birth, ID band Patient awake    Reviewed: Allergy & Precautions, NPO status , Patient's Chart, lab work & pertinent test results  History of Anesthesia Complications (+) PONV and history of anesthetic complications  Airway Mallampati: II  TM Distance: >3 FB Neck ROM: Full    Dental no notable dental hx. (+) Dental Advisory Given   Pulmonary neg pulmonary ROS   Pulmonary exam normal        Cardiovascular Normal cardiovascular exam+ Valvular Problems/Murmurs MR   TTE 07/05/2016: - Left ventricle: The cavity size was normal. Systolic function was    normal. The estimated ejection fraction was in the range of 55%    to 60%. Wall motion was normal; there were no regional wall    motion abnormalities. The study is not technically sufficient to    allow evaluation of LV diastolic function.  - Mitral valve: Post MV repair with trivial residual MR.  - Left atrium: The atrium was mildly dilated.  - Atrial septum: No defect or patent foramen ovale    Neuro/Psych  PSYCHIATRIC DISORDERS Anxiety Depression    negative neurological ROS     GI/Hepatic Neg liver ROS,GERD  ,,  Endo/Other  negative endocrine ROS    Renal/GU negative Renal ROS     Musculoskeletal negative musculoskeletal ROS (+)    Abdominal   Peds  Hematology negative hematology ROS (+)   Anesthesia Other Findings   Reproductive/Obstetrics                             Anesthesia Physical Anesthesia Plan  ASA: 2  Anesthesia Plan: Spinal and MAC   Post-op Pain Management: Regional block* and Tylenol PO (pre-op)*   Induction:   PONV Risk Score and Plan: 4 or greater and Ondansetron, Dexamethasone, Midazolam and Scopolamine patch - Pre-op  Airway Management Planned: Natural Airway  Additional Equipment:   Intra-op Plan:   Post-operative Plan:   Informed Consent: I have  reviewed the patients History and Physical, chart, labs and discussed the procedure including the risks, benefits and alternatives for the proposed anesthesia with the patient or authorized representative who has indicated his/her understanding and acceptance.     Dental advisory given  Plan Discussed with: Anesthesiologist and CRNA  Anesthesia Plan Comments: (PAT note by Karoline Caldwell, PA-C: [facility] for history of MVP s/p mitral valve repair 2017 and clipping of the left atrial appendage. She reestablished with cardiologist Dr. Phineas Inches on 08/26/2022 for preop evaluation. Per note, "Pre-Op: RCRi Class 1 Risk.She is low risk and ischemic evaluation for cardiac risk stratification is not a necessity prior to her procedure.She is acceptable cardiac risk for total knee arthroplasty. S/pMV Repair:will get FUTTE. She can have her procedure prior to the echo."  Patient underwent right TKA 0/48 without complication.  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 She is maintained on sulfasalazine.  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 Last seen 03/13/2021 with repeat imaging and was recommended to continue surveillance at that time.  Preop labs reviewed, unremarkable.  EKG 08/26/2022: NSR. Rate 79.  TTE 07/05/2016: - Left ventricle: The cavity size was normal. Systolic function was  normal. The estimated ejection fraction was in the range of 55%  to 60%. Wall motion was normal; there were  no regional wall  motion abnormalities. The study is not technically sufficient to  allow evaluation of LV diastolic function.  - Mitral valve: Post MV repair with trivial residual MR.  - Left atrium: The atrium was mildly dilated.  - Atrial septum: No defect or patent foramen  ovale  Right/left heart cath 01/16/2016: IMPRESSIONS: Moderate (possibly severe, eccentric) mitral insufficiency. Normal coronary arteries. Normal hemodynamics.  RECOMMENDATION: Monitor closely, will eventually require referral for MV repair.  )        Anesthesia Quick Evaluation

## 2022-12-10 ENCOUNTER — Telehealth: Payer: Self-pay | Admitting: Orthopedic Surgery

## 2022-12-10 NOTE — Telephone Encounter (Signed)
I did not

## 2022-12-10 NOTE — Telephone Encounter (Signed)
LM for patient we have not tried calling

## 2022-12-10 NOTE — Telephone Encounter (Signed)
No I did not either

## 2022-12-10 NOTE — Telephone Encounter (Signed)
Patient states she had missed a call after 5 pm on 12/09/22. She is scheduled to have surgery with dean on Thursday. Please advise..[phone].

## 2022-12-11 ENCOUNTER — Inpatient Hospital Stay: Payer: Medicare Other

## 2022-12-11 MED ORDER — TRANEXAMIC ACID 1000 MG/10ML IV SOLN
2000.0000 mg | INTRAVENOUS | Status: DC
  Filled 2022-12-11 (×2): qty 20

## 2022-12-12 ENCOUNTER — Encounter (HOSPITAL_COMMUNITY)
Admission: AD | Disposition: A | Discharge status: Home Health | Payer: Self-pay | Source: Home / Self Care | Attending: Orthopedic Surgery

## 2022-12-12 ENCOUNTER — Other Ambulatory Visit: Payer: Self-pay

## 2022-12-12 ENCOUNTER — Inpatient Hospital Stay (HOSPITAL_COMMUNITY)
Admission: AD | Admit: 2022-12-12 | Discharge: 2022-12-15 | DRG: 470 | Disposition: A | Discharge status: Home Health | Payer: Medicare Other | Attending: Orthopedic Surgery | Admitting: Orthopedic Surgery

## 2022-12-12 ENCOUNTER — Encounter (HOSPITAL_COMMUNITY): Payer: Self-pay | Admitting: Orthopedic Surgery

## 2022-12-12 ENCOUNTER — Ambulatory Visit (HOSPITAL_COMMUNITY): Payer: Medicare Other | Admitting: Physician Assistant

## 2022-12-12 DIAGNOSIS — M1712 Unilateral primary osteoarthritis, left knee: Principal | ICD-10-CM | POA: Diagnosis present

## 2022-12-12 DIAGNOSIS — Z96652 Presence of left artificial knee joint: Secondary | ICD-10-CM

## 2022-12-12 DIAGNOSIS — M1710 Unilateral primary osteoarthritis, unspecified knee: Secondary | ICD-10-CM | POA: Diagnosis present

## 2022-12-12 DIAGNOSIS — Z01818 Encounter for other preprocedural examination: Principal | ICD-10-CM

## 2022-12-12 DIAGNOSIS — Z8249 Family history of ischemic heart disease and other diseases of the circulatory system: Secondary | ICD-10-CM

## 2022-12-12 DIAGNOSIS — K219 Gastro-esophageal reflux disease without esophagitis: Secondary | ICD-10-CM | POA: Diagnosis present

## 2022-12-12 DIAGNOSIS — Z808 Family history of malignant neoplasm of other organs or systems: Secondary | ICD-10-CM

## 2022-12-12 DIAGNOSIS — Z881 Allergy status to other antibiotic agents status: Secondary | ICD-10-CM

## 2022-12-12 DIAGNOSIS — Z803 Family history of malignant neoplasm of breast: Secondary | ICD-10-CM

## 2022-12-12 DIAGNOSIS — Z96651 Presence of right artificial knee joint: Secondary | ICD-10-CM | POA: Diagnosis present

## 2022-12-12 DIAGNOSIS — Z888 Allergy status to other drugs, medicaments and biological substances status: Secondary | ICD-10-CM

## 2022-12-12 HISTORY — PX: TOTAL KNEE ARTHROPLASTY: SHX125

## 2022-12-12 SURGERY — ARTHROPLASTY, KNEE, TOTAL
Anesthesia: Monitor Anesthesia Care | Site: Knee | Laterality: Left

## 2022-12-12 MED ORDER — BUPIVACAINE LIPOSOME 1.3 % IJ SUSP
INTRAMUSCULAR | Status: AC
  Filled 2022-12-12: qty 20

## 2022-12-12 MED ORDER — METHOCARBAMOL 500 MG PO TABS
500.0000 mg | ORAL_TABLET | Freq: Four times a day (QID) | ORAL | Status: DC | PRN
  Administered 2022-12-12 – 2022-12-15 (×7): 500 mg via ORAL
  Filled 2022-12-12 (×7): qty 1

## 2022-12-12 MED ORDER — TRANEXAMIC ACID-NACL 1000-0.7 MG/100ML-% IV SOLN: 1000.0000 mg | INTRAVENOUS | Status: DC

## 2022-12-12 MED ORDER — ASPIRIN 81 MG PO CHEW
81.0000 mg | CHEWABLE_TABLET | Freq: Two times a day (BID) | ORAL | Status: DC
  Administered 2022-12-12 – 2022-12-15 (×6): 81 mg via ORAL
  Filled 2022-12-12 (×6): qty 1

## 2022-12-12 MED ORDER — ONDANSETRON HCL 4 MG/2ML IJ SOLN
INTRAMUSCULAR | Status: AC
  Filled 2022-12-12: qty 2

## 2022-12-12 MED ORDER — MENTHOL 3 MG MT LOZG: 1.0000 | LOZENGE | OROMUCOSAL | Status: DC | PRN

## 2022-12-12 MED ORDER — METHOCARBAMOL 1000 MG/10ML IJ SOLN: 500.0000 mg | Freq: Four times a day (QID) | INTRAVENOUS | Status: DC | PRN

## 2022-12-12 MED ORDER — IRRISEPT - 450ML BOTTLE WITH 0.05% CHG IN STERILE WATER, USP 99.95% OPTIME
TOPICAL | Status: DC | PRN
  Administered 2022-12-12: 450 mL

## 2022-12-12 MED ORDER — OXYCODONE HCL 5 MG PO TABS
5.0000 mg | ORAL_TABLET | ORAL | Status: DC | PRN
  Administered 2022-12-13: 5 mg via ORAL
  Administered 2022-12-13 – 2022-12-15 (×9): 10 mg via ORAL
  Filled 2022-12-12 (×4): qty 2
  Filled 2022-12-12: qty 1
  Filled 2022-12-12 (×5): qty 2

## 2022-12-12 MED ORDER — AMISULPRIDE (ANTIEMETIC) 5 MG/2ML IV SOLN: 10.0000 mg | Freq: Once | INTRAVENOUS | Status: DC | PRN

## 2022-12-12 MED ORDER — LACTATED RINGERS IV SOLN: INTRAVENOUS | Status: DC | PRN

## 2022-12-12 MED ORDER — FENTANYL CITRATE (PF) 100 MCG/2ML IJ SOLN: 50.0000 ug | Freq: Once | INTRAMUSCULAR | Status: AC

## 2022-12-12 MED ORDER — ORAL CARE MOUTH RINSE: 15.0000 mL | Freq: Once | OROMUCOSAL | Status: AC

## 2022-12-12 MED ORDER — SODIUM CHLORIDE 0.9 % IV SOLN: INTRAVENOUS | Status: AC

## 2022-12-12 MED ORDER — TRANEXAMIC ACID 1000 MG/10ML IV SOLN
INTRAVENOUS | Status: DC | PRN
  Administered 2022-12-12: 2000 mg via TOPICAL

## 2022-12-12 MED ORDER — ACETAMINOPHEN 325 MG PO TABS
325.0000 mg | ORAL_TABLET | Freq: Four times a day (QID) | ORAL | Status: DC | PRN
  Administered 2022-12-14: 325 mg via ORAL
  Filled 2022-12-12: qty 2

## 2022-12-12 MED ORDER — MIDAZOLAM HCL 2 MG/2ML IJ SOLN: 1.0000 mg | Freq: Once | INTRAMUSCULAR | Status: AC

## 2022-12-12 MED ORDER — DEXAMETHASONE SODIUM PHOSPHATE 10 MG/ML IJ SOLN
INTRAMUSCULAR | Status: DC | PRN
  Administered 2022-12-12: 10 mg via INTRAVENOUS

## 2022-12-12 MED ORDER — DEXAMETHASONE SODIUM PHOSPHATE 10 MG/ML IJ SOLN
INTRAMUSCULAR | Status: DC | PRN
  Administered 2022-12-12: 5 mg

## 2022-12-12 MED ORDER — ONDANSETRON HCL 4 MG/2ML IJ SOLN
INTRAMUSCULAR | Status: DC | PRN
  Administered 2022-12-12: 4 mg via INTRAVENOUS

## 2022-12-12 MED ORDER — CLONIDINE HCL (ANALGESIA) 100 MCG/ML EP SOLN
EPIDURAL | Status: AC
  Filled 2022-12-12: qty 10

## 2022-12-12 MED ORDER — FENTANYL CITRATE (PF) 100 MCG/2ML IJ SOLN
25.0000 ug | INTRAMUSCULAR | Status: DC | PRN
  Administered 2022-12-12 (×2): 50 ug via INTRAVENOUS

## 2022-12-12 MED ORDER — PHENOL 1.4 % MT LIQD: 1.0000 | OROMUCOSAL | Status: DC | PRN

## 2022-12-12 MED ORDER — BUPIVACAINE HCL (PF) 0.25 % IJ SOLN
INTRAMUSCULAR | Status: AC
  Filled 2022-12-12: qty 30

## 2022-12-12 MED ORDER — FENTANYL CITRATE (PF) 100 MCG/2ML IJ SOLN
INTRAMUSCULAR | Status: AC
  Filled 2022-12-12: qty 2

## 2022-12-12 MED ORDER — ONDANSETRON HCL 4 MG PO TABS: 4.0000 mg | ORAL_TABLET | Freq: Four times a day (QID) | ORAL | Status: DC | PRN

## 2022-12-12 MED ORDER — 0.9 % SODIUM CHLORIDE (POUR BTL) OPTIME
TOPICAL | Status: DC | PRN
  Administered 2022-12-12: 1000 mL

## 2022-12-12 MED ORDER — ACETAMINOPHEN 500 MG PO TABS
1000.0000 mg | ORAL_TABLET | Freq: Four times a day (QID) | ORAL | Status: AC
  Administered 2022-12-12 – 2022-12-13 (×4): 1000 mg via ORAL
  Filled 2022-12-12 (×4): qty 2

## 2022-12-12 MED ORDER — PHENYLEPHRINE HCL-NACL 20-0.9 MG/250ML-% IV SOLN
INTRAVENOUS | Status: DC | PRN
  Administered 2022-12-12: 25 ug/min via INTRAVENOUS

## 2022-12-12 MED ORDER — TRANEXAMIC ACID 1000 MG/10ML IV SOLN
2000.0000 mg | Freq: Once | INTRAVENOUS | Status: DC
  Filled 2022-12-12: qty 20

## 2022-12-12 MED ORDER — POVIDONE-IODINE 7.5 % EX SOLN: Freq: Once | CUTANEOUS | Status: DC

## 2022-12-12 MED ORDER — FENTANYL CITRATE (PF) 100 MCG/2ML IJ SOLN
INTRAMUSCULAR | Status: AC
  Administered 2022-12-12: 50 ug via INTRAVENOUS
  Filled 2022-12-12: qty 2

## 2022-12-12 MED ORDER — CHLORHEXIDINE GLUCONATE 0.12 % MT SOLN: 15.0000 mL | Freq: Once | OROMUCOSAL | Status: AC

## 2022-12-12 MED ORDER — ACETAMINOPHEN 500 MG PO TABS
1000.0000 mg | ORAL_TABLET | Freq: Once | ORAL | Status: AC
  Administered 2022-12-12: 1000 mg via ORAL
  Filled 2022-12-12: qty 2

## 2022-12-12 MED ORDER — METOCLOPRAMIDE HCL 5 MG/ML IJ SOLN: 5.0000 mg | Freq: Three times a day (TID) | INTRAMUSCULAR | Status: DC | PRN

## 2022-12-12 MED ORDER — POVIDONE-IODINE 10 % EX SWAB
2.0000 | Freq: Once | CUTANEOUS | Status: AC
  Administered 2022-12-12: 2 via TOPICAL

## 2022-12-12 MED ORDER — ACETAMINOPHEN 500 MG PO TABS: 1000.0000 mg | ORAL_TABLET | Freq: Once | ORAL | Status: DC

## 2022-12-12 MED ORDER — MIDAZOLAM HCL 2 MG/2ML IJ SOLN
INTRAMUSCULAR | Status: AC
  Administered 2022-12-12: 1 mg via INTRAVENOUS
  Filled 2022-12-12: qty 2

## 2022-12-12 MED ORDER — LACTATED RINGERS IV SOLN: INTRAVENOUS | Status: DC

## 2022-12-12 MED ORDER — DOCUSATE SODIUM 100 MG PO CAPS
100.0000 mg | ORAL_CAPSULE | Freq: Two times a day (BID) | ORAL | Status: DC
  Administered 2022-12-12 – 2022-12-15 (×6): 100 mg via ORAL
  Filled 2022-12-12 (×6): qty 1

## 2022-12-12 MED ORDER — HYDROMORPHONE HCL 1 MG/ML IJ SOLN
0.5000 mg | INTRAMUSCULAR | Status: DC | PRN
  Administered 2022-12-14 – 2022-12-15 (×6): 0.5 mg via INTRAVENOUS
  Filled 2022-12-12 (×6): qty 0.5

## 2022-12-12 MED ORDER — SODIUM CHLORIDE (PF) 0.9 % IJ SOLN
INTRAMUSCULAR | Status: DC | PRN
  Administered 2022-12-12: 60 mL

## 2022-12-12 MED ORDER — CEFAZOLIN SODIUM-DEXTROSE 2-4 GM/100ML-% IV SOLN
2.0000 g | INTRAVENOUS | Status: AC
  Administered 2022-12-12: 2 g via INTRAVENOUS
  Filled 2022-12-12: qty 100

## 2022-12-12 MED ORDER — SODIUM CHLORIDE 0.9 % IR SOLN
Status: DC | PRN
  Administered 2022-12-12: 3000 mL

## 2022-12-12 MED ORDER — CHLORHEXIDINE GLUCONATE 0.12 % MT SOLN
OROMUCOSAL | Status: AC
  Administered 2022-12-12: 15 mL via OROMUCOSAL
  Filled 2022-12-12: qty 15

## 2022-12-12 MED ORDER — METOCLOPRAMIDE HCL 5 MG PO TABS
5.0000 mg | ORAL_TABLET | Freq: Three times a day (TID) | ORAL | Status: DC | PRN
  Filled 2022-12-12: qty 2

## 2022-12-12 MED ORDER — CEFAZOLIN SODIUM-DEXTROSE 1-4 GM/50ML-% IV SOLN
1.0000 g | Freq: Three times a day (TID) | INTRAVENOUS | Status: AC
  Administered 2022-12-12 – 2022-12-13 (×2): 1 g via INTRAVENOUS
  Filled 2022-12-12 (×2): qty 50

## 2022-12-12 MED ORDER — VANCOMYCIN HCL 1000 MG IV SOLR
INTRAVENOUS | Status: DC | PRN
  Administered 2022-12-12: 1000 mg

## 2022-12-12 MED ORDER — TRANEXAMIC ACID-NACL 1000-0.7 MG/100ML-% IV SOLN
1000.0000 mg | INTRAVENOUS | Status: AC
  Administered 2022-12-12: 1000 mg via INTRAVENOUS
  Filled 2022-12-12: qty 100

## 2022-12-12 MED ORDER — ONDANSETRON HCL 4 MG/2ML IJ SOLN
4.0000 mg | Freq: Four times a day (QID) | INTRAMUSCULAR | Status: DC | PRN
  Administered 2022-12-14: 4 mg via INTRAVENOUS
  Filled 2022-12-12: qty 2

## 2022-12-12 MED ORDER — PROMETHAZINE HCL 25 MG/ML IJ SOLN: 6.2500 mg | INTRAMUSCULAR | Status: DC | PRN

## 2022-12-12 MED ORDER — PROPOFOL 500 MG/50ML IV EMUL
INTRAVENOUS | Status: DC | PRN
  Administered 2022-12-12: 25 mg via INTRAVENOUS
  Administered 2022-12-12: 100 ug/kg/min via INTRAVENOUS

## 2022-12-12 MED ORDER — VANCOMYCIN HCL 1000 MG IV SOLR
INTRAVENOUS | Status: AC
  Filled 2022-12-12: qty 20

## 2022-12-12 MED ORDER — CELECOXIB 100 MG PO CAPS
100.0000 mg | ORAL_CAPSULE | Freq: Two times a day (BID) | ORAL | Status: DC
  Administered 2022-12-13 – 2022-12-15 (×5): 100 mg via ORAL
  Filled 2022-12-12 (×5): qty 1

## 2022-12-12 MED ORDER — ROPIVACAINE HCL 7.5 MG/ML IJ SOLN
INTRAMUSCULAR | Status: DC | PRN
  Administered 2022-12-12: 20 mL via PERINEURAL

## 2022-12-12 MED ORDER — MORPHINE SULFATE (PF) 4 MG/ML IV SOLN
INTRAVENOUS | Status: DC | PRN
  Administered 2022-12-12: 12 mL

## 2022-12-12 MED ORDER — BUPIVACAINE IN DEXTROSE 0.75-8.25 % IT SOLN
INTRATHECAL | Status: DC | PRN
  Administered 2022-12-12: 1.8 mL via INTRATHECAL

## 2022-12-12 MED ORDER — MORPHINE SULFATE (PF) 4 MG/ML IV SOLN
INTRAVENOUS | Status: AC
  Filled 2022-12-12: qty 2

## 2022-12-12 SURGICAL SUPPLY — 65 items
BAG COUNTER SPONGE SURGICOUNT (BAG) ×1 IMPLANT
BLADE SAG 18X100X1.27 (BLADE) ×1 IMPLANT
BLADE SAGITTAL (BLADE) ×1
BLADE SAW THK.89X75X18XSGTL (BLADE) ×1 IMPLANT
BNDG ELASTIC 6X5.8 VLCR STR LF (GAUZE/BANDAGES/DRESSINGS) IMPLANT
BOWL SMART MIX CTS (DISPOSABLE) IMPLANT
CEMENT BONE REFOBACIN R1X40 US (Cement) IMPLANT
CNTNR URN SCR LID CUP LEK RST (MISCELLANEOUS) ×1 IMPLANT
CONT SPEC 4OZ STRL OR WHT (MISCELLANEOUS) ×1
COVER SURGICAL LIGHT HANDLE (MISCELLANEOUS) ×1 IMPLANT
CUFF TOURN SGL QUICK 34 (TOURNIQUET CUFF) ×1
CUFF TRNQT CYL 34X4.125X (TOURNIQUET CUFF) ×1 IMPLANT
DRAPE ORTHO SPLIT 77X108 STRL (DRAPES) ×2
DRAPE SURG ORHT 6 SPLT 77X108 (DRAPES) ×3 IMPLANT
DRAPE U-SHAPE 47X51 STRL (DRAPES) ×1 IMPLANT
DRSG AQUACEL AG ADV 3.5X10 (GAUZE/BANDAGES/DRESSINGS) IMPLANT
DURAPREP 26ML APPLICATOR (WOUND CARE) ×2 IMPLANT
ELECT CAUTERY BLADE 6.4 (BLADE) ×1 IMPLANT
ELECT REM PT RETURN 9FT ADLT (ELECTROSURGICAL) ×1
ELECTRODE REM PT RTRN 9FT ADLT (ELECTROSURGICAL) ×1 IMPLANT
GLOVE BIOGEL PI IND STRL 7.0 (GLOVE) ×1 IMPLANT
GLOVE BIOGEL PI IND STRL 8 (GLOVE) ×1 IMPLANT
GLOVE ECLIPSE 7.0 STRL STRAW (GLOVE) ×1 IMPLANT
GLOVE ECLIPSE 8.0 STRL XLNG CF (GLOVE) ×1 IMPLANT
GLOVE SURG ENC MOIS LTX SZ6.5 (GLOVE) ×3 IMPLANT
GOWN STRL REUS W/ TWL LRG LVL3 (GOWN DISPOSABLE) ×3 IMPLANT
GOWN STRL REUS W/TWL LRG LVL3 (GOWN DISPOSABLE) ×3
HANDPIECE INTERPULSE COAX TIP (DISPOSABLE) ×1
HDLS TROCR DRIL PIN KNEE 75 (PIN) ×1
HOOD PEEL AWAY FLYTE STAYCOOL (MISCELLANEOUS) ×3 IMPLANT
IMMOBILIZER KNEE 22 UNIV (SOFTGOODS) IMPLANT
KIT BASIN OR (CUSTOM PROCEDURE TRAY) ×1 IMPLANT
KIT TURNOVER KIT B (KITS) ×1 IMPLANT
KNEE SYSTEM FEMUR SZ 6 LT (Knees) IMPLANT
MANIFOLD NEPTUNE II (INSTRUMENTS) ×1 IMPLANT
NDL 22X1.5 STRL (OR ONLY) (MISCELLANEOUS) ×2 IMPLANT
NDL SPNL 18GX3.5 QUINCKE PK (NEEDLE) ×1 IMPLANT
NEEDLE 22X1.5 STRL (OR ONLY) (MISCELLANEOUS) ×2 IMPLANT
NEEDLE SPNL 18GX3.5 QUINCKE PK (NEEDLE) ×1 IMPLANT
NS IRRIG 1000ML POUR BTL (IV SOLUTION) ×2 IMPLANT
PACK TOTAL JOINT (CUSTOM PROCEDURE TRAY) ×1 IMPLANT
PAD ARMBOARD 7.5X6 YLW CONV (MISCELLANEOUS) ×2 IMPLANT
PIN DRILL HDLS TROCAR 75 4PK (PIN) IMPLANT
SCREW FEMALE HEX FIX 25X2.5 (ORTHOPEDIC DISPOSABLE SUPPLIES) IMPLANT
SET HNDPC FAN SPRY TIP SCT (DISPOSABLE) ×1 IMPLANT
STEM POLY PAT PLY 32M KNEE (Knees) IMPLANT
STEM TIB ST PERS 14+30 (Stem) IMPLANT
STEM TIBIA 5 DEG SZ E L KNEE (Knees) IMPLANT
STEM TIBIAL SZ6-7 E-F10 (Stem) IMPLANT
STRIP CLOSURE SKIN 1/2X4 (GAUZE/BANDAGES/DRESSINGS) IMPLANT
SUCTION FRAZIER HANDLE 10FR (MISCELLANEOUS) ×1
SUCTION TUBE FRAZIER 10FR DISP (MISCELLANEOUS) ×1 IMPLANT
SUT MNCRL AB 3-0 PS2 18 (SUTURE) ×1 IMPLANT
SUT VIC AB 0 CT1 27 (SUTURE) ×3
SUT VIC AB 0 CT1 27XBRD ANBCTR (SUTURE) ×3 IMPLANT
SUT VIC AB 1 CT1 36 (SUTURE) ×5 IMPLANT
SUT VIC AB 2-0 CT1 27 (SUTURE) ×6
SUT VIC AB 2-0 CT1 TAPERPNT 27 (SUTURE) ×4 IMPLANT
SYR 30ML LL (SYRINGE) ×3 IMPLANT
SYR TB 1ML LUER SLIP (SYRINGE) ×1 IMPLANT
TIBIA STEM 5 DEG SZ E L KNEE (Knees) ×1 IMPLANT
TOWEL GREEN STERILE (TOWEL DISPOSABLE) ×2 IMPLANT
TOWEL GREEN STERILE FF (TOWEL DISPOSABLE) ×2 IMPLANT
WATER STERILE IRR 1000ML POUR (IV SOLUTION) IMPLANT
YANKAUER SUCT BULB TIP NO VENT (SUCTIONS) ×1 IMPLANT

## 2022-12-12 NOTE — Anesthesia Procedure Notes (Signed)
Spinal  Patient location during procedure: OR Start time: 12/12/2022 4:01 PM End time: 12/12/2022 4:08 PM Reason for block: surgical anesthesia Staffing Performed: anesthesiologist  Anesthesiologist: Heather Roberts, MD Performed by: Heather Roberts, MD Authorized by: Heather Roberts, MD   Preanesthetic Checklist Completed: patient identified, IV checked, risks and benefits discussed, surgical consent, monitors and equipment checked, pre-op evaluation and timeout performed Spinal Block Patient position: sitting Prep: DuraPrep Patient monitoring: cardiac monitor, continuous pulse ox and blood pressure Approach: midline Location: L2-3 Injection technique: single-shot Needle Needle type: Pencan  Needle gauge: 24 G Needle length: 9 cm Assessment Events: CSF return Additional Notes Functioning IV was confirmed and monitors were applied. Sterile prep and drape, including hand hygiene and sterile gloves were used. The patient was positioned and the spine was prepped. The skin was anesthetized with lidocaine.  Free flow of clear CSF was obtained prior to injecting local anesthetic into the CSF.  The spinal needle aspirated freely following injection.  The needle was carefully withdrawn.  The patient tolerated the procedure well.

## 2022-12-12 NOTE — Op Note (Signed)
NAMEATHEENA, SPANO MEDICAL RECORD NO: [id number] ACCOUNT NO: [id number] DATE OF BIRTH: 1951/05/28 FACILITY: [facility] LOCATION: [facility] PHYSICIAN: Graylin Shiver. August Saucer, MD  Operative Report   DATE OF PROCEDURE: 12/12/2022  PREOPERATIVE DIAGNOSIS:  Left knee arthritis.  POSTOPERATIVE DIAGNOSIS:  Left knee arthritis.  PROCEDURE:  Left total knee replacement using antibiotic cemented Persona size 6 left cruciate retaining femur 6 narrow, size E tibial tray cemented stemmed with tapered stem extension and 32 mm cemented patella with 10 mm medial congruent polyethylene.  SURGEON:  Graylin Shiver. August Saucer, MD  ASSISTANT:  Karenann Cai, PA  INDICATIONS:  The patient is a 71 year old patient with left knee pain refractory to nonoperative management, who presents for operative management after explanation of risks and benefits.  DESCRIPTION OF PROCEDURE:  The patient was brought to the operating room where spinal anesthetic was induced.  Preoperative antibiotics administered.  Timeout was called.  Left knee prescrubbed with alcohol and Betadine, allowed to air dry, prepped with  DuraPrep solution and draped in sterile manner.  Ioban used to cover the operative field.  Timeout was called.  Left knee was elevated and exsanguinated with the Esmarch wrap 300 mmHg.  Tourniquet was inflated.  Anterior approach to the knee was made.   Skin and subcutaneous tissue were sharply divided.  IrriSept solution utilized. Median parapatellar approach was made and marked with #1 Vicryl suture.  Patella everted.  Fat pad partially excised.  Minimal medial soft tissue dissection was performed in  order to protect the medial collateral ligament.  Lateral patellofemoral ligament was released.  Soft tissue removed from the anterior distal femur.  Patella everted.  ACL released.  Anterior horn lateral meniscus released.  Severe arthritis present in  all 3 compartments.  No infection present.  Mild effusion present.  At this time, PCL was  partially released.  Retractors were placed.  Intramedullary alignment was used to make a cut of 10 mm off the least affected lateral tibial plateau.   Intramedullary alignment was then used to cut the femur in 4 degrees of valgus.  Initially, a 10 mm cut was made and that was later increased to 12 mm.  This allowed a 10 mm spacer to fit well with full extension.  Femur was sized to a size 6.  Anterior,  posterior chamfer cuts made.  Tibial tray placed in appropriate rotation in line with the medial third of the tibial tubercle.  The trial components placed.  Patella then cut down from 21 to 11 mm.  A trial patellar button placed.  With a 10 mm spacer,  the patient had very good alignment.  Full extension, full flexion, no liftoff, and excellent patellar tracking using no thumb technique.  Patella keel punch.  The trial components were removed.  Thorough irrigation was then performed with 3 liters of  irrigating solution. IrriSept and TXA sponge allowed to sit in there for 3 minutes.  Next, this was removed.  Bone plugs placed. IrriSept solution utilized. Then, we put vancomycin powder into the tibial canal.  The cut bony surfaces were dried and the  components were cemented into position with excess cement removed.  Same stability parameters were maintained.  Very good stability to varus and valgus stress at 0, 30 and 90 degrees. Tourniquet released at this time.  Bleeding points encountered  controlled using electrocautery.  Pouring irrigation utilized x3.5 liters.  It should also be noted that we anesthetized the capsule using a combination of Marcaine, clonidine and Exparel prior to  placement of the components.  Once the components were  placed and seated, the arthrotomy was closed using #1 Vicryl suture.  Solution of Marcaine, morphine, clonidine also injected into the knee for postoperative pain relief. Prior to final arthrotomy closure, we irrigated the knee again with IrriSept  solution, placed in  the vancomycin powder, and then closed fully the arthrotomy.  Next, the skin was closed using interrupted inverted 0 Vicryl suture, 2-0 Vicryl suture, and 3-0 Monocryl with Steri-Strips and Aquacel dressing applied along with Ace  wrap, iceman and knee immobilizer.  Luke's assistance was required at all times for retraction, opening, closing, mobilization of tissue.  His assistance was a medical necessity   VAI D: 12/12/2022 6:44:53 pm T: 12/12/2022 10:31:00 pm  JOB: [id number]

## 2022-12-12 NOTE — Transfer of Care (Signed)
Immediate Anesthesia Transfer of Care Note  Patient: Becky Murphy  Procedure(s) Performed: LEFT TOTAL KNEE ARTHROPLASTY (Left: Knee)  Patient Location: PACU  Anesthesia Type:MAC combined with regional for post-op pain  Level of Consciousness: drowsy, patient cooperative, and responds to stimulation  Airway & Oxygen Therapy: Patient Spontanous Breathing and Patient connected to nasal cannula oxygen  Post-op Assessment: Report given to RN, Post -op Vital signs reviewed and stable, and Patient able to stick tongue midline  Post vital signs: Reviewed  Last Vitals:  Vitals Value Taken Time  BP 105/80 12/12/22 1904  Temp 98.3   Pulse 73 12/12/22 1905  Resp 11 12/12/22 1905  SpO2 90 % 12/12/22 1905  Vitals shown include unvalidated device data.  Last Pain:  Vitals:   12/12/22 1352  TempSrc:   PainSc: 0-No pain         Complications: No notable events documented.

## 2022-12-12 NOTE — H&P (Addendum)
TOTAL KNEE ADMISSION H&P  Patient is being admitted for left total knee arthroplasty.  Subjective:  Chief Complaint:left knee pain.  HPI: Becky Murphy, 71 y.o. female, has a history of pain and functional disability in the left knee due to arthritis and has failed non-surgical conservative treatments for greater than 12 weeks to includeNSAID's and/or analgesics, flexibility and strengthening excercises, use of assistive devices, and activity modification.  Onset of symptoms was gradual, starting 3 years ago with gradually worsening course since that time. The patient noted prior procedures on the knee to include  arthroscopy and menisectomy on the left knee(s).  Patient currently rates pain in the left knee(s) at 9 out of 10 with activity. Patient has night pain, worsening of pain with activity and weight bearing, pain that interferes with activities of daily living, pain with passive range of motion, crepitus, and joint swelling.  Patient has evidence of subchondral sclerosis and joint space narrowing by imaging studies. This patient has had  history of atypical left and right knee swelling.  She underwent right knee replacement with antibiotic cement.  Has done very well with that and desires left knee replacement.  No effusion and infection workup negative for the left knee. . There is no active infection.  Patient Active Problem List   Diagnosis Date Noted   Arthritis of knee    Status post right knee replacement 09/03/2022   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   High risk medication use 04/17/2022   Vitamin B 12 deficiency 04/03/2022   Iron deficiency anemia 03/29/2022   Bilateral wrist pain 02/27/2022   Hemarthrosis 02/08/2022   Synovitis of left knee 07/19/2021   Vancomycin adverse reaction 04/19/2021   Therapeutic drug monitoring 04/19/2021   Synovitis of right knee 04/04/2020   Long term (current) use of anticoagulants [Z79.01] 03/14/2016   S/P minimally invasive mitral  valve repair 03/06/2016   Pancreatic mass 01/29/2016   Thyroid nodule 01/29/2016   Exertional dyspnea 01/05/2016   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   Mitral insufficiency    Mitral valve prolapse 12/26/2015   Anxiety 11/02/2015   Past Medical History:  Diagnosis Date   Anemia    low iron in the past   Anxiety    pt denies   Arthritis    "back?" (11/03/2015) knees/hands (08/28/22)   Chest pain 11/02/2015   Depression    pt denies   Exertional dyspnea 01/05/2016   GERD (gastroesophageal reflux disease)    Heart murmur    History of peptic ulcer "late 1970's"   Migraine    "maybe 3-4/yr now" (11/03/2015)   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   Complex valvuloplasty including triangular resection of posterior leaflet, artificial Gore-tex neochord placement x6 and 34 mm Memo 3D ring annuloplasty via right mini thoracotomy approach with clipping of LA appendage   Thyroid nodule 01/29/2016   2.4 cm enhancing mixed cystic and solid nodule inferior left thyroid gland noted on CT angiogram    Past Surgical History:  Procedure Laterality Date   BACK SURGERY     BREAST BIOPSY Left ~ 2005   BREAST  LUMPECTOMY Left ~ 2005   CARDIAC CATHETERIZATION N/A 01/16/2016   Procedure: Right/Left Heart Cath and Coronary Angiography;  Surgeon: Sanda Klein, MD;  Location: [facility];  Service: Cardiovascular;  Laterality: N/A;   CLIPPING OF ATRIAL APPENDAGE Left 03/06/2016   Procedure: CLIPPING OF LEFT ATRIAL APPENDAGE using a 61 PRO2 AtriClip;  Surgeon: Rexene Alberts, MD;  Location: [facility];  Service: Open Heart Surgery;  Laterality: Left;   COLONOSCOPY     EUS N/A 02/20/2016   Procedure: ESOPHAGEAL ENDOSCOPIC ULTRASOUND (EUS) RADIAL;  Surgeon: Arta Silence, MD;  Location: [facility];  Service: Endoscopy;  Laterality: N/A;   EYE MUSCLE SURGERY Bilateral 1970's?   KNEE ARTHROSCOPY Right 04/04/2020   Procedure: RIGHT KNEE ARTHROSCOPY WITH DEBRIDEMENT;  Surgeon: Meredith Pel, MD;  Location: [facility];  Service: Orthopedics;  Laterality: Right;   KNEE ARTHROSCOPY Right 04/10/2021   Procedure: right knee arthroscopy, debridement, placement of antibiotic beads;  Surgeon: Meredith Pel, MD;  Location: [facility];  Service: Orthopedics;  Laterality: Right;   KNEE ARTHROSCOPY Left 12/10/2021   Procedure: LEFT KNEE ARTHROSCOPY, DEBRIDEMENT, PLACEMENT OF STIMULAN BEADS;  Surgeon: Meredith Pel, MD;  Location: [facility];  Service: Orthopedics;  Laterality: Left;   [place]   "trimmed bulges off both sides"   MITRAL VALVE REPAIR Right 03/06/2016   Procedure: MINIMALLY INVASIVE MITRAL VALVE REPAIR (MVR) using a 93 Sorin Memo 3D Ring;  Surgeon: Rexene Alberts, MD;  Location: [facility];  Service: Open Heart Surgery;  Laterality: Right;   TEE WITHOUT CARDIOVERSION N/A 01/05/2016   Procedure: TRANSESOPHAGEAL ECHOCARDIOGRAM (TEE);  Surgeon: Sanda Klein, MD;  Location: [facility];  Service: Cardiovascular;  Laterality: N/A;   TEE WITHOUT CARDIOVERSION N/A 03/06/2016   Procedure: TRANSESOPHAGEAL ECHOCARDIOGRAM (TEE);  Surgeon: Rexene Alberts, MD;  Location: [place];  Service: Open Heart Surgery;  Laterality: N/A;   TOTAL KNEE ARTHROPLASTY Right 09/03/2022   Procedure: RIGHT TOTAL KNEE ARTHROPLASTY-CEMENTED;  Surgeon: Meredith Pel, MD;  Location: [place];  Service: Orthopedics;  Laterality: Right;   TUBAL LIGATION      Current Facility-Administered Medications  Medication Dose Route Frequency Provider Last Rate Last Admin   [START ON 12/13/2022] ceFAZolin (ANCEF) IVPB 2g/100  mL premix  2 g Intravenous On Call to OR Magnant, Gerrianne Scale, PA-C       lactated ringers infusion   Intravenous Continuous Santa Lighter, MD 10 mL/hr at 12/12/22 1344 New Bag at 12/12/22 1344   povidone-iodine (BETADINE) 7.5 % scrub   Topical Once Magnant, Charles L, PA-C       tranexamic acid (CYKLOKAPRON) 2,000 mg in sodium chloride 0.9 % 50 mL Topical Application  123XX123 mg Topical To OR Meredith Pel, MD       tranexamic acid (CYKLOKAPRON) IVPB 1,000 mg  1,000 mg Intravenous To OR Magnant, Charles L, PA-C       Facility-Administered Medications Ordered in Other Encounters  Medication Dose Route Frequency Provider Last Rate Last Admin   dexamethasone (DECADRON) injection   Infiltration Anesthesia Intra-op Duane Boston, MD   5 mg at 12/12/22 1426   ropivacaine (PF) 7.5 mg/mL (0.75%) (NAROPIN) injection   Peri-NEURAL Anesthesia Intra-op Duane Boston, MD   20 mL at 12/12/22 1426   Allergies  Allergen Reactions   Vancomycin Rash   Cyanocobalamin [Vitamin B12] Rash    Injection*    Social History   Tobacco Use   Smoking status: Never    Passive  exposure: Current   Smokeless tobacco: Never  Substance Use Topics   Alcohol use: Yes    Alcohol/week: 10.0 standard drinks of alcohol    Types: 6 Glasses of wine, 4 Shots of liquor per week    Comment: wine every other day    Family History  Problem Relation Age of Onset   Hypertension Mother    Cancer Mother 58       MELANOMA   Cancer Father        PROSTATE   Cancer Sister 2       BREAST     Review of Systems  Musculoskeletal:  Positive for arthralgias.  All other systems reviewed and are negative.   Objective:  Physical Exam Vitals reviewed.  HENT:     Head: Normocephalic.     Nose: Nose normal.     Mouth/Throat:     Mouth: Mucous membranes are moist.  Cardiovascular:     Rate and Rhythm: Normal rate.     Pulses: Normal pulses.  Pulmonary:     Effort: Pulmonary effort is normal.  Abdominal:     General:  Abdomen is flat.  Musculoskeletal:        General: Tenderness present. No swelling or deformity.     Cervical back: Normal range of motion.  Skin:    General: Skin is warm.     Capillary Refill: Capillary refill takes less than 2 seconds.  Neurological:     General: No focal deficit present.     Mental Status: She is alert.  Psychiatric:        Mood and Affect: Mood normal.  Left knee demonstrates no effusion.  No warmth.  Lacks about 15 degrees of full extension but flexes easily past 90.  Pedal pulses palpable.  No proximal lymphadenopathy.  Extensor mechanism intact. Vital signs in last 24 hours: Temp:  [98.5 F (36.9 C)] 98.5 F (36.9 C) (12/14 1251) Pulse Rate:  [75-88] 79 (12/14 1435) Resp:  [15-25] 15 (12/14 1435) BP: (112-149)/(83-95) 112/85 (12/14 1435) SpO2:  [95 %-97 %] 97 % (12/14 1435) Weight:  [68 kg] 68 kg (12/14 1251)  Labs:   Estimated body mass index is 24.21 kg/m as calculated from the following:   Height as of this encounter: 5\' 6"  (1.676 m).   Weight as of this encounter: 68 kg.   Imaging Review Plain radiographs demonstrate severe degenerative joint disease of the left knee(s). The overall alignment isneutral. The bone quality appears to be fair for age and reported activity level.      Assessment/Plan:  End stage arthritis, left knee   The patient history, physical examination, clinical judgment of the provider and imaging studies are consistent with end stage degenerative joint disease of the left knee(s) and total knee arthroplasty is deemed medically necessary. The treatment options including medical management, injection therapy arthroscopy and arthroplasty were discussed at length. The risks and benefits of total knee arthroplasty were presented and reviewed. The risks due to aseptic loosening, infection, stiffness, patella tracking problems, thromboembolic complications and other imponderables were discussed. The patient acknowledged the  explanation, agreed to proceed with the plan and consent was signed. Patient is being admitted for inpatient treatment for surgery, pain control, PT, OT, prophylactic antibiotics, VTE prophylaxis, progressive ambulation and ADL's and discharge planning. The patient is planning to be discharged home with home health services     Patient's anticipated LOS is less than 2 midnights, meeting these requirements: - Younger than 35 - Lives within  1 hour of care - Has a competent adult at home to recover with post-op recover - NO history of  - Chronic pain requiring opiods  - Diabetes  - Coronary Artery Disease  - Heart failure  - Heart attack  - Stroke  - DVT/VTE  - Cardiac arrhythmia  - Respiratory Failure/COPD  - Renal failure  - Anemia  - Advanced Liver disease

## 2022-12-12 NOTE — Plan of Care (Signed)

## 2022-12-12 NOTE — Brief Op Note (Signed)
   12/12/2022  6:38 PM  PATIENT:  Becky Murphy  71 y.o. female  PRE-OPERATIVE DIAGNOSIS:  left knee osteoarthritis  POST-OPERATIVE DIAGNOSIS:  left knee osteoarthritis  PROCEDURE:  Procedure(s): LEFT TOTAL KNEE ARTHROPLASTY  SURGEON:  Surgeon(s): Cammy Copa, MD  ASSISTANT: magnant pa  ANESTHESIA:   spinal  EBL: 75 ml    Total I/O In: 1100 [I.V.:1000; IV Piggyback:100] Out: 50 [Blood:50]  BLOOD ADMINISTERED: none  DRAINS: none   LOCAL MEDICATIONS USED: Marcaine morphine clonidine Exparel vancomycin [id number]  SPECIMEN:  No Specimen  COUNTS:  YES  TOURNIQUET:   Total Tourniquet Time Documented: Thigh (Left) - 75 minutes Total: Thigh (Left) - 75 minutes   DICTATION: .Other Dictation: Dictation Number done  PLAN OF CARE: Admit for overnight observation  PATIENT DISPOSITION:  PACU - hemodynamically stable

## 2022-12-12 NOTE — Progress Notes (Signed)
Pacu RN Report to floor given  Gave report to [employer]. Room: 5N21   Discussed surgery, meds given in OR and Pacu, VS, IV fluids given, EBL, urine output, pain and other pertinent information. Also discussed if pt had any family or friends here or belongings with them.   Pt DTV 2300-0100, spinal is wearing off, able to bend knee on R, lifts hips off bed. Pt is in a bed. She is on RA w/ O2 sats of 93 or above. Exparel was given in OR.   Pt's husband has gone home. He will be called and updated when she goes up to the room.   Pt exits my care.

## 2022-12-12 NOTE — Anesthesia Postprocedure Evaluation (Signed)
Anesthesia Post Note  Patient: Becky Murphy  Procedure(s) Performed: LEFT TOTAL KNEE ARTHROPLASTY (Left: Knee)     Patient location during evaluation: PACU Anesthesia Type: MAC Level of consciousness: oriented and awake and alert Pain management: pain level controlled Vital Signs Assessment: post-procedure vital signs reviewed and stable Respiratory status: spontaneous breathing, respiratory function stable and patient connected to nasal cannula oxygen Cardiovascular status: blood pressure returned to baseline and stable Postop Assessment: no headache, no backache and no apparent nausea or vomiting Anesthetic complications: no   No notable events documented.  Last Vitals:  Vitals:   12/12/22 2000 12/12/22 2015  BP: 100/82 107/83  Pulse: 70 72  Resp: 11 10  Temp: 36.8 C   SpO2: 100% 95%    Last Pain:  Vitals:   12/12/22 2000  TempSrc:   PainSc: Asleep                 Nolon Nations

## 2022-12-12 NOTE — Anesthesia Procedure Notes (Signed)
Anesthesia Regional Block: Adductor canal block   Pre-Anesthetic Checklist: , timeout performed,  Correct Patient, Correct Site, Correct Laterality,  Correct Procedure, Correct Position, site marked,  Risks and benefits discussed,  Surgical consent,  Pre-op evaluation,  At surgeon's request and post-op pain management  Laterality: Left  Prep: chloraprep       Needles:  Injection technique: Single-shot  Needle Type: Stimulator Needle - 80     Needle Length: 10cm  Needle Gauge: 21     Additional Needles:   Narrative:  Start time: 12/12/2022 2:18 PM End time: 12/12/2022 2:28 PM Injection made incrementally with aspirations every 5 mL.  Performed by: Personally  Anesthesiologist: Heather Roberts, MD

## 2022-12-13 NOTE — Progress Notes (Signed)
Orthopedic Tech Progress Note Patient Details:  Becky Murphy 1951-03-16 [id number]  CPM Left Knee CPM Left Knee: Other (Comment) Additional Comments: delivered to room     Trinna Post 12/13/2022, 6:55 AM

## 2022-12-13 NOTE — Progress Notes (Signed)
Orthopedic Tech Progress Note Patient Details:  Becky Murphy Apr 11, 1951 [id number]  Ortho Devices Type of Ortho Device: Bone foam zero knee Ortho Device/Splint Interventions: Rich Brave 12/13/2022, 6:55 AM

## 2022-12-13 NOTE — Progress Notes (Signed)
Physical Therapy Treatment Patient Details Name: Becky Murphy MRN: [id number] DOB: 1951/10/19 Today's Date: 12/13/2022   History of Present Illness Patient is a 71 y/o female who presents on 12/12/2022 for an elective left TKA. 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    PT Comments    Patient progressing well this session. Session focused on HEP handout and mobility. Instructed pt in there ex of LLE. Left knee AROM 5-110 degrees. Pt continues to report pain in left knee despite having pain medications. Pt asked to be placed in CPM machine as she had been up most the morning. Will plan for stair training tomorrow and likely pt will be safe to d/c home. Will follow acutely.   Recommendations for follow up therapy are one component of a multi-disciplinary discharge planning process, led by the attending physician.  Recommendations may be updated based on patient status, additional functional criteria and insurance authorization.  Follow Up Recommendations  Follow physician's recommendations for discharge plan and follow up therapies     Assistance Recommended at Discharge Set up Supervision/Assistance  Patient can return home with the following A little help with walking and/or transfers;A little help with bathing/dressing/bathroom;Assistance with cooking/housework;Assist for transportation;Help with stairs or ramp for entrance   Equipment Recommendations  None recommended by PT    Recommendations for Other Services       Precautions / Restrictions Precautions Precautions: Knee Precaution Booklet Issued: No Precaution Comments: reviewed precautions Restrictions Weight Bearing Restrictions: Yes LLE Weight Bearing: Weight bearing as tolerated     Mobility  Bed Mobility Overal bed mobility: Modified Independent Bed Mobility: Sit to Supine       Sit to supine: Modified independent (Device/Increase time), HOB elevated   General bed  mobility comments: HOB elevated, no assist needed, use of rail.    Transfers Overall transfer level: Needs assistance Equipment used: Rolling walker (2 wheels) Transfers: Sit to/from Stand Sit to Stand: Supervision           General transfer comment: Supervision for safety. Stood from [employer], from toilet x1.    Ambulation/Gait Ambulation/Gait assistance: Min guard Gait Distance (Feet): 15 Feet (x2 bouts) Assistive device: Rolling walker (2 wheels) Gait Pattern/deviations: Step-to pattern, Decreased stance time - left, Decreased step length - right, Trunk flexed Gait velocity: decreased Gait velocity interpretation: <1.31 ft/sec, indicative of household ambulator   General Gait Details: Slow, step to gait with decreased stance time LLE, heavy reliance on UEs for support.   Stairs             Wheelchair Mobility    Modified Rankin (Stroke Patients Only)       Balance Overall balance assessment: Needs assistance Sitting-balance support: Feet supported, No upper extremity supported Sitting balance-Leahy Scale: Good Sitting balance - Comments: Able to perform pericare without assist.   Standing balance support: During functional activity Standing balance-Leahy Scale: Fair Standing balance comment: Able to stand at sink and wash hands leaning on counter with supervision for safety.                            Cognition Arousal/Alertness: Awake/alert Behavior During Therapy: WFL for tasks assessed/performed Overall Cognitive Status: Within Functional Limits for tasks assessed  Exercises Total Joint Exercises Ankle Circles/Pumps: AROM, Both, 10 reps, Supine Quad Sets: AROM, Both, 5 reps, Seated Towel Squeeze: AROM, Both, 10 reps, Supine Short Arc Quad: AROM, AAROM, Left, 5 reps, Supine Heel Slides: AROM, Left, 5 reps, Supine Hip ABduction/ADduction: AROM, Left, 5 reps, Seated Straight  Leg Raises: AAROM, Left, 5 reps, Supine Goniometric ROM: grossly ~5-110 degrees knee AROM    General Comments General comments (skin integrity, edema, etc.): Bandage-clean, dry and intact, no weeping. Mild swelling noted.      Pertinent Vitals/Pain Pain Assessment Pain Assessment: 0-10 Pain Score: 5  Pain Location: left knee Pain Descriptors / Indicators: Sore, Operative site guarding, Discomfort Pain Intervention(s): Monitored during session, Repositioned, Premedicated before session, Limited activity within patient's tolerance    Home Living Family/patient expects to be discharged to:: Private residence Living Arrangements: Spouse/significant other Available Help at Discharge: Family Type of Home: House Home Access: Stairs to enter Entrance Stairs-Rails: Can reach both;Right;Left Entrance Stairs-Number of Steps: 1+1 short steps   Home Layout: One level Home Equipment: Agricultural consultant (2 wheels);Cane - single point;Wheelchair - manual      Prior Function            PT Goals (current goals can now be found in the care plan section) Acute Rehab PT Goals Patient Stated Goal: to decrease pain and go home PT Goal Formulation: With patient Time For Goal Achievement: 12/27/22 Potential to Achieve Goals: Good Progress towards PT goals: Progressing toward goals    Frequency    7X/week      PT Plan Current plan remains appropriate    Co-evaluation              AM-PAC PT "6 Clicks" Mobility   Outcome Measure  Help needed turning from your back to your side while in a flat bed without using bedrails?: None Help needed moving from lying on your back to sitting on the side of a flat bed without using bedrails?: None Help needed moving to and from a bed to a chair (including a wheelchair)?: A Little Help needed standing up from a chair using your arms (e.g., wheelchair or bedside chair)?: A Little Help needed to walk in hospital room?: A Little Help needed climbing  3-5 steps with a railing? : A Little 6 Click Score: 20    End of Session Equipment Utilized During Treatment: Gait belt Activity Tolerance: Patient tolerated treatment well Patient left: in bed;with call bell/phone within reach Nurse Communication: Mobility status PT Visit Diagnosis: Pain;Difficulty in walking, not elsewhere classified (R26.2) Pain - Right/Left: Left Pain - part of body: Knee     Time: 9381-0175 PT Time Calculation (min) (ACUTE ONLY): 24 min  Charges:  $Therapeutic Exercise: 8-22 mins $Therapeutic Activity: 8-22 mins                     Vale Haven, PT, DPT Acute Rehabilitation Services Secure chat preferred Office [phone]      Blake Divine A Maxine Huynh 12/13/2022, 1:11 PM

## 2022-12-13 NOTE — Evaluation (Signed)
Physical Therapy Evaluation Patient Details Name: Becky Murphy MRN: [id number] DOB: 11-24-1951 Today's Date: 12/13/2022  History of Present Illness  Patient is a 71 y/o female who presents on 12/12/2022 for an elective left TKA. 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  Clinical Impression  Patient presents with pain and post surgical deficits s/p above surgery. Pt lives at home with her spouse and reports being independent for household ambulation and using RW for community ambulation since recent knee surgery in September 2023. Today, pt requires heavy Min A to stand from EOB and Min guard assist for gait training with use of RW for support. Education re: knee precautions, positioning and therex. Will plan for HEP and knee measurement in PM session. Will follow acutely to maximize independence and mobility prior to return home.       Recommendations for follow up therapy are one component of a multi-disciplinary discharge planning process, led by the attending physician.  Recommendations may be updated based on patient status, additional functional criteria and insurance authorization.  Follow Up Recommendations Follow physician's recommendations for discharge plan and follow up therapies      Assistance Recommended at Discharge Intermittent Supervision/Assistance  Patient can return home with the following  A little help with walking and/or transfers;A little help with bathing/dressing/bathroom;Assistance with cooking/housework;Assist for transportation;Help with stairs or ramp for entrance    Equipment Recommendations None recommended by PT  Recommendations for Other Services       Functional Status Assessment Patient has had a recent decline in their functional status and demonstrates the ability to make significant improvements in function in a reasonable and predictable amount of time.     Precautions / Restrictions Precautions Precautions:  Knee Precaution Comments: reviewed precautions Restrictions Weight Bearing Restrictions: Yes LLE Weight Bearing: Weight bearing as tolerated      Mobility  Bed Mobility Overal bed mobility: Modified Independent             General bed mobility comments: HOB elevated, no assist needed, use of rail.    Transfers Overall transfer level: Needs assistance Equipment used: Rolling walker (2 wheels) Transfers: Sit to/from Stand Sit to Stand: Min assist           General transfer comment: Heavy Min A to power to standing with cues for hand placement/technique from EOB x1, transferred to chair post ambulation.    Ambulation/Gait Ambulation/Gait assistance: Min guard Gait Distance (Feet): 120 Feet Assistive device: Rolling walker (2 wheels) Gait Pattern/deviations: Step-to pattern, Decreased stance time - left, Decreased step length - right, Trunk flexed Gait velocity: decreased Gait velocity interpretation: <1.31 ft/sec, indicative of household ambulator   General Gait Details: Slow, step to gait with decreased stance time LLE, heavy reliance on UEs for support. Cues for step through gait pattern and knee extension during stance phase on left.  Stairs            Wheelchair Mobility    Modified Rankin (Stroke Patients Only)       Balance Overall balance assessment: Needs assistance Sitting-balance support: Feet supported, No upper extremity supported Sitting balance-Leahy Scale: Good     Standing balance support: During functional activity, Bilateral upper extremity supported Standing balance-Leahy Scale: Poor Standing balance comment: Requires UE support                             Pertinent Vitals/Pain Pain Assessment Pain Assessment: 0-10 Pain Score: 6  Pain Location: left knee Pain Descriptors / Indicators: Sore, Operative site guarding, Discomfort Pain Intervention(s): Monitored during session, Repositioned, Patient requesting pain  meds-RN notified    Home Living Family/patient expects to be discharged to:: Private residence Living Arrangements: Spouse/significant other Available Help at Discharge: Family Type of Home: House Home Access: Stairs to enter Entrance Stairs-Rails: Can reach both;Right;Left Entrance Stairs-Number of Steps: 1+1 short steps   Home Layout: One level Home Equipment: Agricultural consultant (2 wheels);Cane - single point;Wheelchair - manual      Prior Function Prior Level of Function : Independent/Modified Independent             Mobility Comments: has been using RW for community ambulation, independent in household ambulation for thr last few weeks ADLs Comments: independent     Hand Dominance   Dominant Hand: Right    Extremity/Trunk Assessment   Upper Extremity Assessment Upper Extremity Assessment: Defer to OT evaluation    Lower Extremity Assessment Lower Extremity Assessment: RLE deficits/detail RLE Deficits / Details: post surgical deficits noted in ROM and strength RLE Sensation: WNL    Cervical / Trunk Assessment Cervical / Trunk Assessment: Normal  Communication   Communication: No difficulties  Cognition Arousal/Alertness: Awake/alert Behavior During Therapy: WFL for tasks assessed/performed Overall Cognitive Status: Within Functional Limits for tasks assessed                                          General Comments General comments (skin integrity, edema, etc.): Bandage-clean, dry and intact, no weeping. Mild swelling noted.    Exercises Total Joint Exercises Ankle Circles/Pumps: AROM, Both, 10 reps, Supine Quad Sets: AROM, Both, 5 reps, Seated Hip ABduction/ADduction: AROM, Left, 5 reps, Seated   Assessment/Plan    PT Assessment Patient needs continued PT services  PT Problem List Decreased range of motion;Decreased strength;Decreased mobility;Pain;Decreased skin integrity;Decreased balance       PT Treatment Interventions  Therapeutic exercise;Balance training;Stair training;Therapeutic activities;Patient/family education;Gait training;Modalities    PT Goals (Current goals can be found in the Care Plan section)  Acute Rehab PT Goals Patient Stated Goal: to decrease pain and go home PT Goal Formulation: With patient Time For Goal Achievement: 12/27/22 Potential to Achieve Goals: Good    Frequency 7X/week     Co-evaluation               AM-PAC PT "6 Clicks" Mobility  Outcome Measure Help needed turning from your back to your side while in a flat bed without using bedrails?: None Help needed moving from lying on your back to sitting on the side of a flat bed without using bedrails?: None Help needed moving to and from a bed to a chair (including a wheelchair)?: A Little Help needed standing up from a chair using your arms (e.g., wheelchair or bedside chair)?: A Little Help needed to walk in hospital room?: A Little Help needed climbing 3-5 steps with a railing? : A Lot 6 Click Score: 19    End of Session Equipment Utilized During Treatment: Gait belt Activity Tolerance: Patient tolerated treatment well Patient left: in chair;with call bell/phone within reach Nurse Communication: Mobility status;Patient requests pain meds PT Visit Diagnosis: Pain;Difficulty in walking, not elsewhere classified (R26.2) Pain - Right/Left: Left Pain - part of body: Knee    Time: 0350-0938 PT Time Calculation (min) (ACUTE ONLY): 25 min   Charges:   PT Evaluation $PT  Eval Low Complexity: 1 Low PT Treatments $Gait Training: 8-22 mins        Vale Haven, PT, DPT Acute Rehabilitation Services Secure chat preferred Office [phone]     Becky Murphy 12/13/2022, 10:39 AM

## 2022-12-13 NOTE — Progress Notes (Signed)
  Subjective: Pt stable pain ok   Objective: Vital signs in last 24 hours: Temp:  [97.5 F (36.4 C)-98.5 F (36.9 C)] 97.5 F (36.4 C) (12/15 0505) Pulse Rate:  [62-88] 62 (12/15 0505) Resp:  [8-25] 18 (12/15 0505) BP: (97-149)/(72-95) 105/72 (12/15 0505) SpO2:  [95 %-100 %] 98 % (12/15 0505) FiO2 (%):  [21 %] 21 % (12/14 2137) Weight:  [68 kg] 68 kg (12/14 1251)  Intake/Output from previous day: 12/14 0701 - 12/15 0700 In: 1400 [I.V.:1300; IV Piggyback:100] Out: 2350 [Urine:2300; Blood:50] Intake/Output this shift: No intake/output data recorded.  Exam:  Sensation intact distally Intact pulses distally Dorsiflexion/Plantar flexion intact Can do slr left Labs: No results for input(s): "HGB" in the last 72 hours. No results for input(s): "WBC", "RBC", "HCT", "PLT" in the last 72 hours. No results for input(s): "NA", "K", "CL", "CO2", "BUN", "CREATININE", "GLUCOSE", "CALCIUM" in the last 72 hours. No results for input(s): "LABPT", "INR" in the last 72 hours.  Assessment/Plan: Mobilize today with PT Continue iv abx Dc am Asa for dvt prophylaxis   G Dorene Grebe 12/13/2022, 7:11 AM

## 2022-12-14 DIAGNOSIS — Z96651 Presence of right artificial knee joint: Secondary | ICD-10-CM

## 2022-12-14 DIAGNOSIS — Z803 Family history of malignant neoplasm of breast: Secondary | ICD-10-CM | POA: Diagnosis not present

## 2022-12-14 DIAGNOSIS — Z808 Family history of malignant neoplasm of other organs or systems: Secondary | ICD-10-CM | POA: Diagnosis not present

## 2022-12-14 DIAGNOSIS — M1710 Unilateral primary osteoarthritis, unspecified knee: Secondary | ICD-10-CM | POA: Diagnosis present

## 2022-12-14 DIAGNOSIS — K219 Gastro-esophageal reflux disease without esophagitis: Secondary | ICD-10-CM | POA: Diagnosis present

## 2022-12-14 DIAGNOSIS — Z8249 Family history of ischemic heart disease and other diseases of the circulatory system: Secondary | ICD-10-CM | POA: Diagnosis not present

## 2022-12-14 DIAGNOSIS — Z881 Allergy status to other antibiotic agents status: Secondary | ICD-10-CM | POA: Diagnosis not present

## 2022-12-14 DIAGNOSIS — Z888 Allergy status to other drugs, medicaments and biological substances status: Secondary | ICD-10-CM | POA: Diagnosis not present

## 2022-12-14 DIAGNOSIS — M1712 Unilateral primary osteoarthritis, left knee: Secondary | ICD-10-CM | POA: Diagnosis present

## 2022-12-14 NOTE — Progress Notes (Signed)
Physical Therapy Treatment Patient Details Name: Becky Murphy MRN: [id number] DOB: 02-Sep-1951 Today's Date: 12/14/2022   History of Present Illness Patient is a 71 y/o female who presents on 12/12/2022 for an elective left TKA. 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    PT Comments    PM session: Pt with slow but steady progress towards goals with focus on gait for increased activity tolerance, pt declining stair training. Pt demonstrating increased tolerance for gait this session needing min guard for safety with  RW. Pt continues to be limited by pain, poor balance/postural reactions and LLE weakness. Reiterated education on HEP, importance of continued mobility and activity recommendations with pt verbalizing understanding. Pt continues to benefit from skilled PT services to progress toward functional mobility goals.    Recommendations for follow up therapy are one component of a multi-disciplinary discharge planning process, led by the attending physician.  Recommendations may be updated based on patient status, additional functional criteria and insurance authorization.  Follow Up Recommendations  Follow physician's recommendations for discharge plan and follow up therapies     Assistance Recommended at Discharge Set up Supervision/Assistance  Patient can return home with the following A little help with walking and/or transfers;A little help with bathing/dressing/bathroom;Assistance with cooking/housework;Assist for transportation;Help with stairs or ramp for entrance   Equipment Recommendations  None recommended by PT    Recommendations for Other Services       Precautions / Restrictions Precautions Precautions: Knee Precaution Booklet Issued: No Precaution Comments: reviewed precautions Restrictions Weight Bearing Restrictions: Yes LLE Weight Bearing: Weight bearing as tolerated     Mobility  Bed Mobility Overal bed mobility:  Needs Assistance Bed Mobility: Sit to Supine, Supine to Sit     Supine to sit: Min assist Sit to supine: Supervision   General bed mobility comments: min assist to being LLE to and off EOB, pt able to return to supine without physical assist    Transfers Overall transfer level: Needs assistance Equipment used: Rolling walker (2 wheels) Transfers: Sit to/from Stand Sit to Stand: Min guard           General transfer comment: min guard for safety    Ambulation/Gait Ambulation/Gait assistance: Min guard Gait Distance (Feet): 40 Feet Assistive device: Rolling walker (2 wheels) Gait Pattern/deviations: Step-to pattern, Decreased stance time - left, Decreased step length - right, Trunk flexed Gait velocity: decreased     General Gait Details: Slow antalgic, step to gait with decreased stance time LLE, heavy reliance on UEs for support, improved posture   Stairs Stairs: Yes Stairs assistance: Max assist Stair Management: No rails, With walker Number of Stairs: 0 General stair comments: pt declining attempt this PM   Wheelchair Mobility    Modified Rankin (Stroke Patients Only)       Balance Overall balance assessment: Needs assistance Sitting-balance support: Feet supported, No upper extremity supported Sitting balance-Leahy Scale: Good Sitting balance - Comments: Able to perform pericare without assist.   Standing balance support: During functional activity Standing balance-Leahy Scale: Poor Standing balance comment: heavy reliance on RW                            Cognition Arousal/Alertness: Awake/alert Behavior During Therapy: WFL for tasks assessed/performed Overall Cognitive Status: Within Functional Limits for tasks assessed  Exercises Total Joint Exercises Quad Sets: AROM, Seated, Left, 10 reps Heel Slides: AROM, Left, 5 reps, Supine Hip ABduction/ADduction: AROM, Left, 5 reps,  Supine Knee Flexion: AROM, Left, 10 reps, Seated Other Exercises Other Exercises: seated marchig x10    General Comments        Pertinent Vitals/Pain Pain Assessment Pain Assessment: Faces Pain Score: 8  Faces Pain Scale: Hurts even more Pain Location: left knee Pain Descriptors / Indicators: Sore, Operative site guarding, Discomfort Pain Intervention(s): Monitored during session, Limited activity within patient's tolerance, Premedicated before session    Home Living                          Prior Function            PT Goals (current goals can now be found in the care plan section) Acute Rehab PT Goals Patient Stated Goal: to decrease pain PT Goal Formulation: With patient Time For Goal Achievement: 12/27/22 Progress towards PT goals: Progressing toward goals    Frequency    7X/week      PT Plan Current plan remains appropriate    Co-evaluation              AM-PAC PT "6 Clicks" Mobility   Outcome Measure  Help needed turning from your back to your side while in a flat bed without using bedrails?: None Help needed moving from lying on your back to sitting on the side of a flat bed without using bedrails?: None Help needed moving to and from a bed to a chair (including a wheelchair)?: A Little Help needed standing up from a chair using your arms (e.g., wheelchair or bedside chair)?: A Little Help needed to walk in hospital room?: A Lot Help needed climbing 3-5 steps with a railing? : A Lot 6 Click Score: 18    End of Session Equipment Utilized During Treatment: Gait belt Activity Tolerance: Patient limited by pain Patient left: in bed;with call bell/phone within reach Nurse Communication: Mobility status;Other (comment) PT Visit Diagnosis: Pain;Difficulty in walking, not elsewhere classified (R26.2) Pain - Right/Left: Left Pain - part of body: Knee     Time: 1535-1550 PT Time Calculation (min) (ACUTE ONLY): 15 min  Charges:  $Gait  Training: 8-22 mins                     Kypton Eltringham R. PTA Acute Rehabilitation Services Office: [phone]    Catalina Antigua 12/14/2022, 4:25 PM

## 2022-12-14 NOTE — Care Management Obs Status (Signed)
MEDICARE OBSERVATION STATUS NOTIFICATION   Patient Details  Name: Becky Murphy MRN: [id number] Date of Birth: 1951-02-12   Medicare Observation Status Notification Given:  Yes    Bess Kinds, RN 12/14/2022, 2:02 PM

## 2022-12-14 NOTE — Progress Notes (Signed)
Physical Therapy Treatment Patient Details Name: Becky Murphy MRN: [id number] DOB: [birth year] Today's Date: 12/14/2022   History of Present Illness Patient is a 71 y/o female who presents on 12/12/2022 for an elective left TKA. 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    PT Comments    Pt greeted supine in bed and agreeable to session, however pt with limited progress towards goals secondary to increased pain, despite pre-medication. Pt able to come to sit EOB with min assist. Pt needing mod assist this date to power up to standing with RW  and up to mod assist for short ambulation in room to steady and for general support as pt able to bear limited weight through LLE. Pt needing increased cues this date for upright posture and to push up through Ues to un weight painful LE as pt flexing trunk anterior over front rail of RW secondary to pain. Pt attempting curb step in room however pt unable to step RLE up on step with max assist, with pt crying from pain and stating need for back to bed and declining further attempts despite encouragement and reassurance. Educated pt re; importance of continued mobility, ice, and positioning to encourage extension with pt verbalizing understanding. Plan for PM session as time allows to progress functional mobility for ultimate safe d/c to home. Pt continues to benefit from skilled PT services to progress toward functional mobility goals.    Recommendations for follow up therapy are one component of a multi-disciplinary discharge planning process, led by the attending physician.  Recommendations may be updated based on patient status, additional functional criteria and insurance authorization.  Follow Up Recommendations  Follow physician's recommendations for discharge plan and follow up therapies     Assistance Recommended at Discharge Set up Supervision/Assistance  Patient can return home with the following A little help  with walking and/or transfers;A little help with bathing/dressing/bathroom;Assistance with cooking/housework;Assist for transportation;Help with stairs or ramp for entrance   Equipment Recommendations  None recommended by PT    Recommendations for Other Services       Precautions / Restrictions Precautions Precautions: Knee Precaution Booklet Issued: No Precaution Comments: reviewed precautions Restrictions Weight Bearing Restrictions: Yes LLE Weight Bearing: Weight bearing as tolerated     Mobility  Bed Mobility Overal bed mobility: Needs Assistance Bed Mobility: Sit to Supine, Supine to Sit     Supine to sit: Min assist Sit to supine: Supervision   General bed mobility comments: min assist to being LLE to and off EOB, pt able to return to supine without physical assist    Transfers Overall transfer level: Needs assistance Equipment used: Rolling walker (2 wheels) Transfers: Sit to/from Stand Sit to Stand: Mod assist           General transfer comment: mod a to power up and steady on rise 2/2 increased pain, cues for upright posture as pt with anterior flexion over front rail of RW    Ambulation/Gait Ambulation/Gait assistance: Min assist, Mod assist Gait Distance (Feet): 6 Feet Assistive device: Rolling walker (2 wheels) Gait Pattern/deviations: Step-to pattern, Decreased stance time - left, Decreased step length - right, Trunk flexed Gait velocity: decreased     General Gait Details: Slow antalgic, step to gait with decreased stance time LLE, heavy reliance on UEs for support increasing to mod a after step attempt 2/2 increased pain and fatigue   Stairs Stairs: Yes Stairs assistance: Max assist Stair Management: No rails, With walker Number of  Stairs: 0 General stair comments: pt able to ambualte up to curb step in room and bring RW onto curb pt unable to step up with RLE onto step, pt crying in pain with trunk flexed anterior over rail of RW, cues and  assist to push up through UE to unweight painful LE and for upright posture.   Wheelchair Mobility    Modified Rankin (Stroke Patients Only)       Balance Overall balance assessment: Needs assistance Sitting-balance support: Feet supported, No upper extremity supported Sitting balance-Leahy Scale: Good Sitting balance - Comments: Able to perform pericare without assist.   Standing balance support: During functional activity Standing balance-Leahy Scale: Poor Standing balance comment: heavy reliance on RW                            Cognition Arousal/Alertness: Awake/alert Behavior During Therapy: WFL for tasks assessed/performed Overall Cognitive Status: Within Functional Limits for tasks assessed                                          Exercises Total Joint Exercises Quad Sets: AROM, Both, 5 reps, Seated Heel Slides: AROM, Left, 5 reps, Supine    General Comments        Pertinent Vitals/Pain Pain Assessment Pain Assessment: 0-10 Pain Score: 8  Pain Location: left knee Pain Descriptors / Indicators: Sore, Operative site guarding, Discomfort Pain Intervention(s): Premedicated before session, Monitored during session, Limited activity within patient's tolerance, Repositioned, Ice applied    Home Living                          Prior Function            PT Goals (current goals can now be found in the care plan section) Acute Rehab PT Goals Patient Stated Goal: to decrease pain PT Goal Formulation: With patient Time For Goal Achievement: 12/27/22 Progress towards PT goals: Not progressing toward goals - comment (limited by pain)    Frequency    7X/week      PT Plan Current plan remains appropriate    Co-evaluation              AM-PAC PT "6 Clicks" Mobility   Outcome Measure  Help needed turning from your back to your side while in a flat bed without using bedrails?: None Help needed moving from lying on  your back to sitting on the side of a flat bed without using bedrails?: None Help needed moving to and from a bed to a chair (including a wheelchair)?: A Little Help needed standing up from a chair using your arms (e.g., wheelchair or bedside chair)?: A Little Help needed to walk in hospital room?: A Lot Help needed climbing 3-5 steps with a railing? : A Lot 6 Click Score: 18    End of Session Equipment Utilized During Treatment: Gait belt Activity Tolerance: Patient limited by pain Patient left: in bed;with call bell/phone within reach Nurse Communication: Mobility status;Other (comment) (increased pain and limited progress) PT Visit Diagnosis: Pain;Difficulty in walking, not elsewhere classified (R26.2) Pain - Right/Left: Left Pain - part of body: Knee     Time: 1610-9604 PT Time Calculation (min) (ACUTE ONLY): 23 min  Charges:  $Gait Training: 8-22 mins $Therapeutic Exercise: 8-22 mins  Lenora Boys. PTA Acute Rehabilitation Services Office: [phone]    Catalina Antigua 12/14/2022, 10:25 AM

## 2022-12-14 NOTE — Care Management CC44 (Signed)
Condition Code 44 Documentation Completed  Patient Details  Name: Becky Murphy MRN: [id number] Date of Birth: [birth year]   Condition Code 44 given:  Yes Patient signature on Condition Code 44 notice:  Yes Documentation of 2 MD's agreement:  Yes Code 44 added to claim:  Yes    Bess Kinds, RN 12/14/2022, 2:02 PM

## 2022-12-14 NOTE — Progress Notes (Signed)
  Subjective: Becky Murphy is a 71 y.o. female s/p left TKA.  They are POD2.  Pt's pain is moderate but controlled in bed.  Pt denies any complain of chest pain, shortness of breath, abdominal pain, calf pain.  Patient denies any fevers or chills. She was able to ambulate with PT a small amount and tried stairs without success.  No lightheadedness/dizziness with ambulation. Pain is increased since about 2 PM yesterday  Objective: Vital signs in last 24 hours: Temp:  [97.7 F (36.5 C)-98.4 F (36.9 C)] 98.4 F (36.9 C) (12/16 0900) Pulse Rate:  [73-87] 87 (12/16 0900) Resp:  [16-18] 16 (12/16 0900) BP: (93-136)/(72-87) 136/87 (12/16 0900) SpO2:  [95 %-97 %] 97 % (12/16 0900)  Intake/Output from previous day: 12/15 0701 - 12/16 0700 In: 120 [P.O.:120] Out: 525 [Urine:525] Intake/Output this shift: No intake/output data recorded.  Exam:  No gross blood or drainage overlying the dressing 2+ DP pulse Sensation intact distally in the operative foot Able to dorsiflex and plantarflex the operative foot No calf tenderness.  Negative Homans' sign. Unable to perform straight leg raise   Labs: No results for input(s): "HGB" in the last 72 hours. No results for input(s): "WBC", "RBC", "HCT", "PLT" in the last 72 hours. No results for input(s): "NA", "K", "CL", "CO2", "BUN", "CREATININE", "GLUCOSE", "CALCIUM" in the last 72 hours. No results for input(s): "LABPT", "INR" in the last 72 hours.  Assessment/Plan: Pt is POD2 s/p TKA.    -Plan to discharge to home likely tomorrow pending patient's pain and PT eval  -Suspect patient's pain increased from intraoperative Exparel injection wearing off mid-day yesterday  -WBAT with a walker  -Follow-up with Dr. August Saucer in clinic 2 weeks postoperatively    [facility] 12/14/2022, 11:21 AM

## 2022-12-15 MED ORDER — ACETAMINOPHEN 325 MG PO TABS: 325.0000 mg | ORAL_TABLET | Freq: Four times a day (QID) | ORAL | 0 refills | Status: AC | PRN

## 2022-12-15 MED ORDER — ASPIRIN 81 MG PO CHEW: 81.0000 mg | CHEWABLE_TABLET | Freq: Two times a day (BID) | ORAL | 0 refills | Status: AC

## 2022-12-15 MED ORDER — OXYCODONE HCL 5 MG PO TABS: 5.0000 mg | ORAL_TABLET | ORAL | 0 refills | Status: DC | PRN

## 2022-12-15 MED ORDER — DOCUSATE SODIUM 100 MG PO CAPS: 100.0000 mg | ORAL_CAPSULE | Freq: Two times a day (BID) | ORAL | 0 refills | Status: AC

## 2022-12-15 MED ORDER — METHOCARBAMOL 500 MG PO TABS: 500.0000 mg | ORAL_TABLET | Freq: Four times a day (QID) | ORAL | 0 refills | Status: AC | PRN

## 2022-12-15 NOTE — Progress Notes (Signed)
  Subjective: Patient stable.  Pain improved.  Would like to be discharged home today.   Objective: Vital signs in last 24 hours: Temp:  [98 F (36.7 C)-99 F (37.2 C)] 99 F (37.2 C) (12/17 0612) Pulse Rate:  [87-99] 89 (12/17 0612) Resp:  [14-16] 16 (12/17 0612) BP: (136-156)/(83-91) 142/86 (12/17 0612) SpO2:  [95 %-97 %] 96 % (12/17 0612)  Intake/Output from previous day: No intake/output data recorded. Intake/Output this shift: No intake/output data recorded.  Exam:  Sensation intact distally Intact pulses distally Dorsiflexion/Plantar flexion intact  Labs: No results for input(s): "HGB" in the last 72 hours. No results for input(s): "WBC", "RBC", "HCT", "PLT" in the last 72 hours. No results for input(s): "NA", "K", "CL", "CO2", "BUN", "CREATININE", "GLUCOSE", "CALCIUM" in the last 72 hours. No results for input(s): "LABPT", "INR" in the last 72 hours.  Assessment/Plan: Plan at this time is discharge to home after physical therapy this morning.  Patient does have family at home.  Continue with CPM machine use.  Follow-up in 2 weeks.   Marrianne Mood Amanii Snethen 12/15/2022, 8:58 AM

## 2022-12-15 NOTE — TOC Transition Note (Signed)
Transition of Care [facility]) - CM/SW Discharge Note   Patient Details  Name: ARTESHA WEMHOFF MRN: [id number] Date of Birth: Apr 05, 1951  Transition [facility] Care [facility]) CM/SW Contact:  Bess Kinds, RN Phone Number: [phone] 12/15/2022, 10:05 AM  Patient to transition home today. HH PT arranged preoperatively with [facility]. - liaison for [facility] aware of transition home today. No further TOC needs identified at this time.   Clinical Narrative:       Final next level of care: Home w Home Health Services Barriers to Discharge: No Barriers Identified   Patient Goals and CMS Choice        Discharge Placement                       Discharge Plan and Services                          HH Arranged: PT HH Agency: [facility] Date [facility] Agency Contacted: 12/15/22 Time HH Agency Contacted: 1004 Representative spoke with at [facility] Agency: Laurelyn Sickle  Social Determinants of Health (SDOH) Interventions     Readmission Risk Interventions     No data to display

## 2022-12-15 NOTE — Progress Notes (Signed)
Physical Therapy Treatment Patient Details Name: Becky Murphy MRN: [id number] DOB: [birth year] Today's Date: 12/15/2022   History of Present Illness Patient is a 71 y/o female who presents on 12/12/2022 for an elective left TKA. 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    PT Comments    Pt admitted with above diagnosis. All education complete for pt and pt feels confident in going home with family today.  No further education needs to be completed and pt can continue therapy at home. Should d/c today.  Pt currently with functional limitations due to balance and endurance deficits. Pt will benefit from skilled PT to increase their independence and safety with mobility to allow discharge to the venue listed below.      Recommendations for follow up therapy are one component of a multi-disciplinary discharge planning process, led by the attending physician.  Recommendations may be updated based on patient status, additional functional criteria and insurance authorization.  Follow Up Recommendations  Follow physician's recommendations for discharge plan and follow up therapies     Assistance Recommended at Discharge Set up Supervision/Assistance  Patient can return home with the following A little help with walking and/or transfers;A little help with bathing/dressing/bathroom;Assistance with cooking/housework;Assist for transportation;Help with stairs or ramp for entrance   Equipment Recommendations  None recommended by PT    Recommendations for Other Services       Precautions / Restrictions Precautions Precautions: Knee Precaution Booklet Issued: No Precaution Comments: reviewed precautions Restrictions Weight Bearing Restrictions: Yes LLE Weight Bearing: Weight bearing as tolerated     Mobility  Bed Mobility Overal bed mobility: Needs Assistance Bed Mobility: Sit to Supine, Supine to Sit     Supine to sit: Supervision     General bed  mobility comments: No assist as pt used right LE to move left LE off bed, cues for technique only    Transfers Overall transfer level: Needs assistance Equipment used: Rolling walker (2 wheels) Transfers: Sit to/from Stand Sit to Stand: Min guard           General transfer comment: min guard for safety    Ambulation/Gait Ambulation/Gait assistance: Min guard Gait Distance (Feet): 175 Feet Assistive device: Rolling walker (2 wheels) Gait Pattern/deviations: Step-to pattern, Decreased stance time - left, Decreased step length - right, Trunk flexed Gait velocity: decreased Gait velocity interpretation: <1.31 ft/sec, indicative of household ambulator   General Gait Details: improving gait, still slightly slow and antalgic, step to gait with decreased stance time LLE, less reliance on UEs for support, improved posture   Stairs Stairs: Yes Stairs assistance: Min guard Stair Management: No rails, Backwards, With walker, Step to pattern   General stair comments: Pt taught going up backwards with RW and did well with technique and was appreciative.   Wheelchair Mobility    Modified Rankin (Stroke Patients Only)       Balance Overall balance assessment: Needs assistance Sitting-balance support: Feet supported, No upper extremity supported Sitting balance-Leahy Scale: Good Sitting balance - Comments: Able to perform pericare without assist.   Standing balance support: During functional activity Standing balance-Leahy Scale: Poor Standing balance comment: relies on UE support but overall good balance with RW and doesn have to have external support                            Cognition Arousal/Alertness: Awake/alert Behavior During Therapy: WFL for tasks assessed/performed Overall Cognitive Status: Within Functional  Limits for tasks assessed                                          Exercises Total Joint Exercises Ankle Circles/Pumps: AROM,  Both, 10 reps, Supine Quad Sets: AROM, Seated, Left, 10 reps Heel Slides: AROM, Left, 5 reps, Supine Hip ABduction/ADduction: AROM, Left, 5 reps, Supine Straight Leg Raises: AAROM, Left, 5 reps, Supine Knee Flexion: AROM, Left, 10 reps, Seated Goniometric ROM: 8-80 degrees    General Comments        Pertinent Vitals/Pain Pain Assessment Pain Assessment: Faces Faces Pain Scale: Hurts even more Pain Location: left knee Pain Descriptors / Indicators: Sore, Operative site guarding, Discomfort Pain Intervention(s): Limited activity within patient's tolerance, Monitored during session, Repositioned    Home Living                          Prior Function            PT Goals (current goals can now be found in the care plan section) Acute Rehab PT Goals Patient Stated Goal: to decrease pain Progress towards PT goals: Progressing toward goals    Frequency    7X/week      PT Plan Current plan remains appropriate    Co-evaluation              AM-PAC PT "6 Clicks" Mobility   Outcome Measure  Help needed turning from your back to your side while in a flat bed without using bedrails?: None Help needed moving from lying on your back to sitting on the side of a flat bed without using bedrails?: None Help needed moving to and from a bed to a chair (including a wheelchair)?: A Little Help needed standing up from a chair using your arms (e.g., wheelchair or bedside chair)?: A Little Help needed to walk in hospital room?: A Little Help needed climbing 3-5 steps with a railing? : A Lot 6 Click Score: 19    End of Session Equipment Utilized During Treatment: Gait belt Activity Tolerance: Patient limited by pain Patient left: in bed;with call bell/phone within reach Nurse Communication: Mobility status;Other (comment) PT Visit Diagnosis: Pain;Difficulty in walking, not elsewhere classified (R26.2) Pain - Right/Left: Left Pain - part of body: Knee     Time:  7591-6384 PT Time Calculation (min) (ACUTE ONLY): 26 min  Charges:  $Gait Training: 8-22 mins $Therapeutic Exercise: 8-22 mins                     [facility] M,PT Acute Rehab Services [phone]    Bevelyn Buckles 12/15/2022, 9:55 AM

## 2022-12-16 ENCOUNTER — Encounter (HOSPITAL_COMMUNITY): Payer: Self-pay | Admitting: Orthopedic Surgery

## 2022-12-19 ENCOUNTER — Telehealth: Payer: Self-pay

## 2022-12-19 NOTE — Telephone Encounter (Signed)
Dorene Sorrow with HHPT called to let us know that patient did not get started with HHPT until yesterday -stated they started her late because she would not return their phone calls. Also wanted to let us know patient refused any HHPT after visit yesterday until at least the day after Christmas. Told him that she had too much to do to worry about them coming out to home. I advised Dorene Sorrow would make you aware.

## 2022-12-26 ENCOUNTER — Ambulatory Visit (INDEPENDENT_AMBULATORY_CARE_PROVIDER_SITE_OTHER): Payer: Medicare Other

## 2022-12-26 ENCOUNTER — Ambulatory Visit (INDEPENDENT_AMBULATORY_CARE_PROVIDER_SITE_OTHER): Payer: Medicare Other | Admitting: Surgical

## 2022-12-26 ENCOUNTER — Telehealth: Payer: Self-pay

## 2022-12-26 ENCOUNTER — Ambulatory Visit
Admission: RE | Admit: 2022-12-26 | Discharge: 2022-12-26 | Disposition: A | Discharge status: Home / Self Care | Payer: Medicare Other | Source: Ambulatory Visit | Attending: Surgical | Admitting: Surgical

## 2022-12-26 ENCOUNTER — Ambulatory Visit (HOSPITAL_COMMUNITY)
Admission: RE | Admit: 2022-12-26 | Discharge: 2022-12-26 | Disposition: A | Discharge status: Home / Self Care | Payer: Medicare Other | Source: Ambulatory Visit | Attending: Surgical | Admitting: Surgical

## 2022-12-26 DIAGNOSIS — Z96652 Presence of left artificial knee joint: Secondary | ICD-10-CM

## 2022-12-26 NOTE — Progress Notes (Signed)
VASCULAR LAB    Left lower extremity venous duplex has been performed.  See CV proc for preliminary results.  Called report to Elvera Lennox, Vester Titsworth, RVT 12/26/2022, 4:13 PM

## 2022-12-26 NOTE — Telephone Encounter (Signed)
Franky Macho tried calling patient to advise doppler negative for DVT, no answer.

## 2022-12-27 ENCOUNTER — Telehealth: Payer: Self-pay | Admitting: Surgical

## 2022-12-27 ENCOUNTER — Other Ambulatory Visit: Payer: Self-pay | Admitting: Surgical

## 2022-12-27 MED ORDER — GABAPENTIN 300 MG PO CAPS: 300.0000 mg | ORAL_CAPSULE | Freq: Three times a day (TID) | ORAL | 0 refills | Status: DC

## 2022-12-27 MED ORDER — OXYCODONE HCL 5 MG PO TABS: 5.0000 mg | ORAL_TABLET | ORAL | 0 refills | Status: DC | PRN

## 2022-12-27 NOTE — Telephone Encounter (Signed)
Sent in. I will call her

## 2022-12-27 NOTE — Telephone Encounter (Signed)
Patient called in stating Franky Macho said he would prescribe her Oxycodone and gabapentin to help the pain in her knee please advise

## 2022-12-30 ENCOUNTER — Encounter: Payer: Self-pay | Admitting: Surgical

## 2022-12-30 NOTE — Progress Notes (Signed)
Post-Op Visit Note   Patient: Becky Murphy           Date of Birth: May 17, 1951           MRN: [id number] Visit Date: 12/26/2022 PCP: Leonard Downing, MD   Assessment & Plan:  Chief Complaint:  Chief Complaint  Patient presents with   Left Knee - Routine Post Op    L TKA- (surgery date 12-12-22)   Visit Diagnoses:  1. Status post total left knee replacement     Plan: Becky Murphy is a 72 y.o. female who presents s/p left total knee arthroplasty on 12/12/2022.  Doing well overall.  They deny any shortness of breath, chest pain, abdominal pain.  Pain is overall controlled but still more severe than it was after her last knee replacement.  taking oxycodone and gabapentin for pain control.  She has resumed her sulfasalazine taking aspirin for DVT prophylaxis.  Ambulating with walker.  She does note increase in low back pain.  Does not typically have a lot of back pain.  She states she has had worsening back pain ever since she woke up from surgery.  She is not having any radicular pain down either leg.  No loss of bowel or bladder control.  On exam, patient has range of motion 5 degrees extension to 105 degrees of knee flexion..  Incision is healing well without evidence of infection or dehiscence.  2+ DP pulse of the operative extremity.  Positive calf tenderness, positive Homans' sign.  Able to perform straight leg raise without any extensor lag.  Intact ankle dorsiflexion.  Quad strength is much improved compared with during her hospital stay.  Plan is continue with physical therapy with referral to outpatient physical therapy.  She has been working with home health physical therapy who notes she has progressed to a passive range of motion of between 8 degrees extension and 110 degrees of knee flexion.  Range of motion looks excellent and there is no sign of prosthetic joint infection.  She does have increased calf pain and tenderness on exam today and there is concern for blood  clot.  Ordered ultrasound to rule out DVT which ended up being negative at the time of this dictation.  Also ordered MRI of the lumbar spine for further evaluation of her significantly increased low back pain.  This was found to not have any emergent findings.  Refilled oxycodone and gabapentin.  Follow-up with Dr. Marlou Sa in 4 weeks.  Plan to reach out by phone to see how she is doing sometime next week.   Follow-Up Instructions: No follow-ups on file.   Orders:  Orders Placed This Encounter  Procedures   XR Knee 1-2 Views Left   MR Lumbar Spine w/o contrast   Ambulatory referral to Physical Therapy   VAS [place] LOWER EXTREMITY VENOUS (DVT)   No orders of the defined types were placed in this encounter.   Imaging: No results found.  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   High risk medication use 04/17/2022   Vitamin B 12 deficiency 04/03/2022   Iron deficiency anemia 03/29/2022   Bilateral wrist pain 02/27/2022   Hemarthrosis 02/08/2022   Synovitis of left knee 07/19/2021  Vancomycin adverse reaction 04/19/2021   Therapeutic drug monitoring 04/19/2021   Synovitis of right knee 04/04/2020   Long term (current) use of anticoagulants [Z79.01] 03/14/2016   S/P minimally invasive mitral valve repair 03/06/2016   Pancreatic mass 01/29/2016   Thyroid nodule 01/29/2016   Exertional dyspnea 01/05/2016   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   Mitral insufficiency    Mitral valve prolapse 12/26/2015   Anxiety 11/02/2015   Past Medical History:  Diagnosis Date   Anemia    low iron in the past   Anxiety    pt denies   Arthritis    "back?" (11/03/2015) knees/hands (08/28/22)   Chest pain  11/02/2015   Depression    pt denies   Exertional dyspnea 01/05/2016   GERD (gastroesophageal reflux disease)    Heart murmur    History of peptic ulcer "late 1970's"   Migraine    "maybe 3-4/yr now" (11/03/2015)   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   Complex valvuloplasty including triangular resection of posterior leaflet, artificial Gore-tex neochord placement x6 and 34 mm Memo 3D ring annuloplasty via right mini thoracotomy approach with clipping of LA appendage   Thyroid nodule 01/29/2016   2.4 cm enhancing mixed cystic and solid nodule inferior left thyroid gland noted on CT angiogram    Family History  Problem Relation Age of Onset   Hypertension Mother    Cancer Mother 43       MELANOMA   Cancer Father        PROSTATE   Cancer Sister 53       BREAST    Past Surgical History:  Procedure Laterality Date   BACK SURGERY     BREAST BIOPSY Left ~ 2005   BREAST LUMPECTOMY Left ~ 2005   CARDIAC CATHETERIZATION N/A 01/16/2016   Procedure: Right/Left Heart Cath and Coronary Angiography;  Surgeon: Sanda Klein, MD;  Location: [facility];  Service: Cardiovascular;  Laterality: N/A;   CLIPPING OF ATRIAL APPENDAGE Left 03/06/2016   Procedure: CLIPPING OF LEFT ATRIAL APPENDAGE using a 59 PRO2 AtriClip;  Surgeon: Rexene Alberts, MD;  Location: [facility];  Service: Open Heart Surgery;  Laterality: Left;   COLONOSCOPY     EUS N/A 02/20/2016   Procedure: ESOPHAGEAL ENDOSCOPIC ULTRASOUND (EUS) RADIAL;  Surgeon: Arta Silence, MD;  Location: [facility];  Service: Endoscopy;  Laterality: N/A;   EYE  MUSCLE SURGERY Bilateral 1970's?   KNEE ARTHROSCOPY Right 04/04/2020   Procedure: RIGHT KNEE ARTHROSCOPY WITH DEBRIDEMENT;  Surgeon: Meredith Pel, MD;  Location: [facility];  Service: Orthopedics;  Laterality: Right;   KNEE ARTHROSCOPY Right 04/10/2021   Procedure: right knee arthroscopy, debridement, placement of antibiotic beads;  Surgeon: Meredith Pel, MD;  Location: [facility];  Service: Orthopedics;  Laterality: Right;   KNEE ARTHROSCOPY Left 12/10/2021   Procedure: LEFT KNEE ARTHROSCOPY, DEBRIDEMENT, PLACEMENT OF STIMULAN BEADS;  Surgeon: Meredith Pel, MD;  Location: [facility];  Service: Orthopedics;  Laterality: Left;   [place]   "trimmed bulges off both sides"   MITRAL VALVE REPAIR Right 03/06/2016  Procedure: MINIMALLY INVASIVE MITRAL VALVE REPAIR (MVR) using a 34 Sorin Memo 3D Ring;  Surgeon: Purcell Nails, MD;  Location: [facility];  Service: Open Heart Surgery;  Laterality: Right;   TEE WITHOUT CARDIOVERSION N/A 01/05/2016   Procedure: TRANSESOPHAGEAL ECHOCARDIOGRAM (TEE);  Surgeon: Thurmon Fair, MD;  Location: [facility];  Service: Cardiovascular;  Laterality: N/A;   TEE WITHOUT CARDIOVERSION N/A 03/06/2016   Procedure: TRANSESOPHAGEAL ECHOCARDIOGRAM (TEE);  Surgeon: Purcell Nails, MD;  Location: [facility];  Service: Open Heart Surgery;  Laterality: N/A;   TOTAL KNEE ARTHROPLASTY Right 09/03/2022   Procedure: RIGHT TOTAL KNEE ARTHROPLASTY-CEMENTED;  Surgeon: Cammy Copa, MD;  Location: [facility] OR;  Service: Orthopedics;  Laterality: Right;   TOTAL KNEE ARTHROPLASTY Left 12/12/2022   Procedure: LEFT TOTAL KNEE ARTHROPLASTY;  Surgeon: Cammy Copa, MD;  Location: [facility] OR;  Service: Orthopedics;  Laterality: Left;   TUBAL LIGATION     Social History   Occupational History   Not on file  Tobacco Use   Smoking status: Never    Passive exposure: Current   Smokeless tobacco: Never  Vaping Use   Vaping Use: Never used  Substance and Sexual Activity   Alcohol  use: Yes    Alcohol/week: 10.0 standard drinks of alcohol    Types: 6 Glasses of wine, 4 Shots of liquor per week    Comment: wine every other day   Drug use: No   Sexual activity: Not Currently    Birth control/protection: Post-menopausal

## 2022-12-31 ENCOUNTER — Telehealth: Payer: Self-pay | Admitting: Orthopedic Surgery

## 2022-12-31 DIAGNOSIS — Z96652 Presence of left artificial knee joint: Secondary | ICD-10-CM

## 2022-12-31 NOTE — Telephone Encounter (Signed)
Home health calling to get out patient P/T at our office set up. Please call Belenda Cruise) home health nurse [phone], also requesting an appt for P/T--patients number is [phone]

## 2022-12-31 NOTE — Telephone Encounter (Signed)
Yes she is okay for outpatient physical therapy.  Just focus on gait training, range of motion, quadricep strengthening s/p total knee arthroplasty.  Let me know if they need anything else

## 2023-01-01 NOTE — Telephone Encounter (Signed)
Outpt therapy ordered

## 2023-01-01 NOTE — Addendum Note (Signed)
Addended byLaurann Montana on: 01/01/2023 02:33 PM   Modules accepted: Orders

## 2023-01-02 ENCOUNTER — Other Ambulatory Visit: Payer: Self-pay

## 2023-01-02 DIAGNOSIS — M5410 Radiculopathy, site unspecified: Secondary | ICD-10-CM

## 2023-01-03 NOTE — Discharge Summary (Signed)
Physician Discharge Summary      Patient ID: Becky Murphy MRN: [id number] DOB/AGE: Feb 07, 1951 72 y.o.  Admit date: 12/12/2022 Discharge date: 12/15/2022  Admission Diagnoses:  Principal Problem:   Knee arthropathy Active Problems:   Arthritis of left knee   S/P total knee arthroplasty, left   S/P total knee arthroplasty, right   Discharge Diagnoses:  Same  Surgeries: Procedure(s): LEFT TOTAL KNEE ARTHROPLASTY on 12/12/2022   Consultants:   Discharged Condition: Stable  Hospital Course: Becky Murphy is an 72 y.o. female who was admitted 12/12/2022 with a chief complaint of left knee pain, and found to have a diagnosis of left knee osteoarthritis.  They were brought to the operating room on 12/12/2022 and underwent the above named procedures.  Pt awoke from anesthesia without complication and was transferred to the floor. On POD1, patient's pain was overall controlled.  She struggled with therapy for the first day or 2 but was able to eventually get to the point where she was ambulating with minimal assistance and accomplish stairs.  No red flag symptoms while she was staying in the hospital.  Discharged home on POD 3..  Pt will f/u with Dr. August Saucer in clinic in ~2 weeks.   Antibiotics given:  Anti-infectives (From admission, onward)    Start     Dose/Rate Route Frequency Ordered Stop   12/13/22 0600  ceFAZolin (ANCEF) IVPB 2g/100 mL premix        2 g 200 mL/hr over 30 Minutes Intravenous On call to O.R. 12/12/22 1318 12/12/22 1620   12/12/22 2215  ceFAZolin (ANCEF) IVPB 1 g/50 mL premix        1 g 100 mL/hr over 30 Minutes Intravenous Every 8 hours 12/12/22 2124 12/13/22 0537   12/12/22 1737  vancomycin (VANCOCIN) powder  Status:  Discontinued          As needed 12/12/22 1738 12/12/22 1900     .  Recent vital signs:  Vitals:   12/15/22 0612 12/15/22 1037  BP: (!) 142/86 114/85  Pulse: 89 (!) 58  Resp: 16 18  Temp: 99 F (37.2 C) 98.6 F (37 C)  SpO2: 96% 93%     Recent laboratory studies:  Results for orders placed or performed during the hospital encounter of 12/06/22  Urine Culture   Specimen: Urine, Clean Catch  Result Value Ref Range   Specimen Description URINE, CLEAN CATCH    Special Requests NONE    Culture (A)     <10,000 COLONIES/mL INSIGNIFICANT GROWTH Performed at [facility], [address], [place], [place] [address]    Report Status 12/07/2022 FINAL   Surgical pcr screen   Specimen: Nasal Mucosa; Nasal Swab  Result Value Ref Range   MRSA, PCR NEGATIVE NEGATIVE   Staphylococcus aureus NEGATIVE NEGATIVE  CBC  Result Value Ref Range   WBC 4.8 4.0 - 10.5 K/uL   RBC 4.95 3.87 - 5.11 MIL/uL   Hemoglobin 15.3 (H) 12.0 - 15.0 g/dL   HCT 93.2 67.1 - 24.5 %   MCV 92.3 80.0 - 100.0 fL   MCH 30.9 26.0 - 34.0 pg   MCHC 33.5 30.0 - 36.0 g/dL   RDW 80.9 98.3 - 38.2 %   Platelets  150 - 400 K/uL    PLATELET CLUMPS NOTED ON SMEAR, COUNT APPEARS DECREASED   nRBC 0.0 0.0 - 0.2 %  Basic metabolic panel  Result Value Ref Range   Sodium 140 135 - 145 mmol/L   Potassium  4.2 3.5 - 5.1 mmol/L   Chloride 108 98 - 111 mmol/L   CO2 22 22 - 32 mmol/L   Glucose, Bld 92 70 - 99 mg/dL   BUN 10 8 - 23 mg/dL   Creatinine, Ser 0.69 0.44 - 1.00 mg/dL   Calcium 8.9 8.9 - 10.3 mg/dL   GFR, Estimated >60 >60 mL/min   Anion gap 10 5 - 15  Urinalysis, Complete w Microscopic  Result Value Ref Range   Color, Urine YELLOW YELLOW   APPearance CLEAR CLEAR   Specific Gravity, Urine 1.006 1.005 - 1.030   pH 5.0 5.0 - 8.0   Glucose, UA NEGATIVE NEGATIVE mg/dL   Hgb urine dipstick NEGATIVE NEGATIVE   Bilirubin Urine NEGATIVE NEGATIVE   Ketones, ur NEGATIVE NEGATIVE mg/dL   Protein, ur NEGATIVE NEGATIVE mg/dL   Nitrite NEGATIVE NEGATIVE   Leukocytes,Ua NEGATIVE NEGATIVE   RBC / HPF 0-5 0 - 5 RBC/hpf   WBC, UA 0-5 0 - 5 WBC/hpf   Bacteria, UA RARE (A) NONE SEEN   Squamous Epithelial / HPF 0-5 0 - 5    Discharge Medications:   Allergies  as of 12/15/2022       Reactions   Vancomycin Rash   Cyanocobalamin [vitamin B12] Rash   Injection*        Medication List     STOP taking these medications    amoxicillin 500 MG capsule Commonly known as: AMOXIL   celecoxib 100 MG capsule Commonly known as: CELEBREX   oxyCODONE 5 MG immediate release tablet Commonly known as: Oxy IR/ROXICODONE       TAKE these medications    acetaminophen 325 MG tablet Commonly known as: TYLENOL Take 1-2 tablets (325-650 mg total) by mouth every 6 (six) hours as needed for mild pain (pain score 1-3 or temp > 100.5).   aspirin 81 MG chewable tablet Chew 1 tablet (81 mg total) by mouth 2 (two) times daily. What changed: when to take this   B-12 Fast Dissolve 5000 MCG Subl Generic drug: Methylcobalamin Place 5,000 mcg under the tongue daily.   cholecalciferol 25 MCG (1000 UNIT) tablet Commonly known as: VITAMIN D3 Take 1,000 Units by mouth daily.   diphenhydrAMINE 25 MG tablet Commonly known as: BENADRYL Take 25 mg by mouth daily as needed for allergies.   docusate sodium 100 MG capsule Commonly known as: COLACE Take 1 capsule (100 mg total) by mouth 2 (two) times daily.   methocarbamol 500 MG tablet Commonly known as: ROBAXIN Take 1 tablet (500 mg total) by mouth every 6 (six) hours as needed for muscle spasms. What changed: when to take this   sulfaSALAzine 500 MG tablet Commonly known as: AZULFIDINE Take 2 tablets (1,000 mg total) by mouth 3 (three) times daily.        Diagnostic Studies: MR Lumbar Spine w/o contrast  Result Date: 12/26/2022 CLINICAL DATA:  Low back pain and left leg pain and weakness. Recent knee arthroplasty. Evaluate for hematoma related to recent spinal anesthesia. EXAM: MRI LUMBAR SPINE WITHOUT CONTRAST TECHNIQUE: Multiplanar, multisequence MR imaging of the lumbar spine was performed. No intravenous contrast was administered. COMPARISON:  CT abdomen 03/04/2016 FINDINGS: Segmentation:   Standard. Alignment: Mild lumbar dextroscoliosis. Mild straightening of the normal lumbar lordosis. No significant listhesis in the sagittal plane. Vertebrae: No fracture, suspicious marrow lesion, or significant marrow edema. Mild multilevel chronic degenerative endplate changes. Conus medullaris and cauda equina: Conus extends to the T12-L1 level. Conus and cauda equina appear normal. No  evidence of an epidural hematoma. 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 Disc levels: Disc desiccation throughout the lumbar spine. Mild disc space narrowing from L1-2 through L3-4 and moderate to severe narrowing at L4-5 and L5-S1. T12-L1: Negative. L1-2: Mild disc bulging and mild facet hypertrophy without stenosis. L2-3: Left eccentric disc bulging and mild facet and ligamentum flavum hypertrophy result in borderline to mild left greater than right lateral recess stenosis and mild left neural foraminal stenosis without spinal stenosis. L3-4: Disc bulging and mild-to-moderate facet and ligamentum flavum hypertrophy result in mild bilateral lateral recess stenosis and borderline bilateral neural foraminal stenosis without significant spinal stenosis. L4-5: Disc bulging and mild facet and ligamentum flavum hypertrophy result in mild-to-moderate left greater than right lateral recess stenosis and mild right and borderline left neural foraminal stenosis without spinal stenosis. L5-S1: Prior laminectomies. Disc bulging, endplate spurring, and mild facet hypertrophy without stenosis. IMPRESSION: 1. No evidence of an epidural hematoma. 2. Multilevel lumbar disc and facet degeneration with multilevel lateral recess stenosis as above, greatest at L4-5. Electronically Signed   By: Logan Bores M.D.   On: 12/26/2022 18:59   VAS [place] LOWER EXTREMITY VENOUS (DVT)  Result Date: 12/26/2022  Lower Venous DVT Study Patient Name:  JERLISA DILIBERTO [facility]  Date of  Exam:   12/26/2022 Medical Rec #: [id number]       Accession #:    [id number] Date of Birth: 12-14-1951       Patient Gender: F Patient Age:   35 years Exam Location:  [facility] Procedure:      VAS [place] LOWER EXTREMITY VENOUS (DVT) Referring Phys: Gloriann Loan --------------------------------------------------------------------------------  Indications: Lateral hip and thigh pain radiating to posterior calf status post nerve block secondary to knee replacement.  Comparison Study: Prior negative left LEV done 06/15/21 Performing Technologist: Sharion Dove RVS  Examination Guidelines: A complete evaluation includes B-mode imaging, spectral Doppler, color Doppler, and power Doppler as needed of all accessible portions of each vessel. Bilateral testing is considered an integral part of a complete examination. Limited examinations for reoccurring indications may be performed as noted. The reflux portion of the exam is performed with the patient in reverse Trendelenburg.  +-----+---------------+---------+-----------+----------+--------------+ RIGHTCompressibilityPhasicitySpontaneityPropertiesThrombus Aging +-----+---------------+---------+-----------+----------+--------------+ CFV  Full           Yes      Yes                                 +-----+---------------+---------+-----------+----------+--------------+   +---------+---------------+---------+-----------+----------+--------------+ LEFT     CompressibilityPhasicitySpontaneityPropertiesThrombus Aging +---------+---------------+---------+-----------+----------+--------------+ CFV      Full           Yes      Yes                                 +---------+---------------+---------+-----------+----------+--------------+ SFJ      Full                                                        +---------+---------------+---------+-----------+----------+--------------+ FV Prox  Full                                                         +---------+---------------+---------+-----------+----------+--------------+  FV Mid   Full                                                        +---------+---------------+---------+-----------+----------+--------------+ FV DistalFull                                                        +---------+---------------+---------+-----------+----------+--------------+ PFV      Full                                                        +---------+---------------+---------+-----------+----------+--------------+ POP      Full           Yes      Yes                                 +---------+---------------+---------+-----------+----------+--------------+ PTV      Full                                                        +---------+---------------+---------+-----------+----------+--------------+ PERO     Full                                                        +---------+---------------+---------+-----------+----------+--------------+     Summary: RIGHT: - No evidence of common femoral vein obstruction. - Ultrasound characteristics of enlarged lymph nodes are noted in the groin.  LEFT: - There is no evidence of deep vein thrombosis in the lower extremity.  - No cystic structure found in the popliteal fossa. - Ultrasound characteristics of enlarged lymph nodes noted in the groin.  *See table(s) above for measurements and observations. Electronically signed by Coral Else MD on 12/26/2022 at 5:24:17 PM.    Final     Disposition: Discharge disposition: 01-Home or Self Care       Discharge Instructions     Call MD / Call 911   Complete by: As directed    If you experience chest pain or shortness of breath, CALL 911 and be transported to the hospital emergency room.  If you develope a fever above 101 F, pus (white drainage) or increased drainage or redness at the wound, or calf pain, call your surgeon's office.   Constipation Prevention   Complete by: As  directed    Drink plenty of fluids.  Prune juice may be helpful.  You may use a stool softener, such as Colace (over the counter) 100 mg twice a day.  Use MiraLax (over the counter) for constipation as needed.   Diet - low sodium heart healthy   Complete by: As directed  Discharge instructions   Complete by: As directed    Use CPM machine 1 hour 3 times a day increasing the degrees daily Okay to shower dressing is waterproof Weightbearing as tolerated left lower extremity. Follow-up in 2 weeks for clinical recheck   Increase activity slowly as tolerated   Complete by: As directed    Post-operative opioid taper instructions:   Complete by: As directed    POST-OPERATIVE OPIOID TAPER INSTRUCTIONS: It is important to wean off of your opioid medication as soon as possible. If you do not need pain medication after your surgery it is ok to stop day one. Opioids include: Codeine, Hydrocodone(Norco, Vicodin), Oxycodone(Percocet, oxycontin) and hydromorphone amongst others.  Long term and even short term use of opiods can cause: Increased pain response Dependence Constipation Depression Respiratory depression And more.  Withdrawal symptoms can include Flu like symptoms Nausea, vomiting And more Techniques to manage these symptoms Hydrate well Eat regular healthy meals Stay active Use relaxation techniques(deep breathing, meditating, yoga) Do Not substitute Alcohol to help with tapering If you have been on opioids for less than two weeks and do not have pain than it is ok to stop all together.  Plan to wean off of opioids This plan should start within one week post op of your joint replacement. Maintain the same interval or time between taking each dose and first decrease the dose.  Cut the total daily intake of opioids by one tablet each day Next start to increase the time between doses. The last dose that should be eliminated is the evening dose.           Follow-up  Information     [facility] Follow up.   Specialty: Home Health Services Contact information: [address] [place] [address] [phone]         Cammy Copa, MD. Schedule an appointment as soon as possible for a visit in 2 week(s).   Specialty: Orthopedic Surgery Contact information: [address] [place] [address] [phone]                  Signed: Karenann Cai 01/03/2023, 7:59 AM

## 2023-01-06 ENCOUNTER — Encounter: Payer: Self-pay | Admitting: Rehabilitative and Restorative Service Providers"

## 2023-01-06 ENCOUNTER — Encounter: Payer: Self-pay | Admitting: Physical Medicine and Rehabilitation

## 2023-01-06 ENCOUNTER — Ambulatory Visit (INDEPENDENT_AMBULATORY_CARE_PROVIDER_SITE_OTHER): Payer: Medicare Other | Admitting: Rehabilitative and Restorative Service Providers"

## 2023-01-06 ENCOUNTER — Ambulatory Visit (INDEPENDENT_AMBULATORY_CARE_PROVIDER_SITE_OTHER): Payer: Medicare Other | Admitting: Physical Medicine and Rehabilitation

## 2023-01-06 ENCOUNTER — Other Ambulatory Visit: Payer: Self-pay

## 2023-01-06 DIAGNOSIS — G8929 Other chronic pain: Secondary | ICD-10-CM

## 2023-01-06 DIAGNOSIS — Z96652 Presence of left artificial knee joint: Secondary | ICD-10-CM | POA: Diagnosis not present

## 2023-01-06 DIAGNOSIS — M4726 Other spondylosis with radiculopathy, lumbar region: Secondary | ICD-10-CM

## 2023-01-06 DIAGNOSIS — M48061 Spinal stenosis, lumbar region without neurogenic claudication: Secondary | ICD-10-CM | POA: Diagnosis not present

## 2023-01-06 DIAGNOSIS — M6281 Muscle weakness (generalized): Secondary | ICD-10-CM

## 2023-01-06 DIAGNOSIS — M25662 Stiffness of left knee, not elsewhere classified: Secondary | ICD-10-CM

## 2023-01-06 DIAGNOSIS — R262 Difficulty in walking, not elsewhere classified: Secondary | ICD-10-CM | POA: Diagnosis not present

## 2023-01-06 DIAGNOSIS — M5416 Radiculopathy, lumbar region: Secondary | ICD-10-CM | POA: Diagnosis not present

## 2023-01-06 DIAGNOSIS — M25562 Pain in left knee: Secondary | ICD-10-CM

## 2023-01-06 DIAGNOSIS — R600 Localized edema: Secondary | ICD-10-CM

## 2023-01-06 MED ORDER — GABAPENTIN 300 MG PO CAPS: 300.0000 mg | ORAL_CAPSULE | Freq: Three times a day (TID) | ORAL | 0 refills | Status: DC

## 2023-01-06 NOTE — Progress Notes (Unsigned)
Functional Pain Scale - descriptive words and definitions  Intense (8)    Cannot complete any ADLs without much assistance/cannot concentrate/conversation is difficult/unable to sleep and unable to use distraction. Severe range order  Average Pain 8  Lower back pain in the center that radiates to the left side to the hip and down the left leg to the calf

## 2023-01-06 NOTE — Progress Notes (Unsigned)
Becky Murphy - 72 y.o. female MRN [id number]  Date of birth: 08-23-1951  Office Visit Note: Visit Date: 01/06/2023 PCP: Kaleen Mask, MD Referred by: Kaleen Mask, *  Subjective: Chief Complaint  Patient presents with   Lower Back - Pain   HPI: Becky Murphy is a 72 y.o. female who comes in today per the request of Harriette Bouillon, [place] for evaluation of chronic, worsening and severe left sided lower back pain radiating down entire left leg. Patient is somewhat of poor historian, after discussion her pain does seem to be most severe down posterolateral aspect of leg. Pain ongoing for several weeks following left total knee arthroplasty on 12/12/2022 by Dr. Dorene Grebe. Per review of anesthesia notes she did receive both spinal and adductor canal block. States her pain started after surgery and is constant in nature, she describes pain as sore and aching, currently rates as 8 out of 10. No relief of pain with rest and medications. She is currently taking Oxycodone, Gabapentin and Robaxin. Started post op physical therapy today with our in house team. Recent lumbar MRI imaging exhibits multi level lateral recess stenosis, most prominent on the left at L4-L5. No evidence of epidural hematoma. Remote history of laminectomy at the level of L5-S1 by Dr. Vira Browns 30 plus years ago. Recent left lower extremity doppler study negative for DVT. Patient currently using walker to assist with ambulation. Patient denies focal weakness, numbness and tingling. Patient denies recent trauma or falls.    Review of Systems  Musculoskeletal:  Positive for back pain.  Neurological:  Negative for tingling, sensory change, focal weakness and weakness.  All other systems reviewed and are negative.  Otherwise per HPI.  Assessment & Plan: Visit Diagnoses:    ICD-10-CM   1. Lumbar radiculopathy  M54.16 Ambulatory referral to Physical Medicine Rehab    2. Other spondylosis with radiculopathy,  lumbar region  M47.26 Ambulatory referral to Physical Medicine Rehab    3. Stenosis of lateral recess of lumbar spine  M48.061 Ambulatory referral to Physical Medicine Rehab    4. Status post total left knee replacement  Z96.652 Ambulatory referral to Physical Medicine Rehab       Plan: Findings:  Chronic, worsening and severe left sided lower back pain radiating down entire left leg, most severe pain to posterolateral leg. Patient continues to have severe pain despite good conservative therapies such as rest and use of medications.  Patient's clinical presentation and exam are complex, her pain distribution is consistent with lumbar radiculopathy, her pain distribution seems to be more of an L5 pattern. There is mild-to-moderate left lateral recess stenosis at L4-L5 on recent lumbar MRI imaging. Differentials could also include pain from femoral nerve block. Next step is to perform diagnostic and hopefully therapeutic left L4-L5 interlaminar epidural steroid injection under fluoroscopic guidance. Patient is not currently taking anticoagulant medication. If good relief of pain with injection we can repeat this procedure infrequently as needed. If her pain persists we did discuss possible nerve conduction study of left lower extremity to rule out femoral nerve damage. I did discuss medication management with her today, would like her to try Gabapentin 1 tablet (300 mg) in morning and 2 tablets (600 mg) at bedtime until we get her back in for injection. I did refill Gabapentin for her today and will ensure Dr. Jonathon Jordan is aware of this change. No red flag symptoms noted upon exam today.   I spent 45 minutes or more of  face to face time with patient today. Patient seems to be apprehensive about injection and I was able to east her concerns by explaining lumbar epidural steroid injection procedure including risks. I also provided patient with educational material regarding injection procedure today.      Meds & Orders:  Meds ordered this encounter  Medications   gabapentin (NEURONTIN) 300 MG capsule    Sig: Take 1 capsule (300 mg total) by mouth 3 (three) times daily.    Dispense:  90 capsule    Refill:  0    Orders Placed This Encounter  Procedures   Ambulatory referral to Physical Medicine Rehab    Follow-up: Return for Left L4-L5 interlaminar epidural steroid injection.   Procedures: No procedures performed      Clinical History: MRI LUMBAR SPINE WITHOUT CONTRAST   TECHNIQUE: Multiplanar, multisequence MR imaging of the lumbar spine was performed. No intravenous contrast was administered.   COMPARISON:  CT abdomen 03/04/2016   FINDINGS: Segmentation:  Standard.   Alignment: Mild lumbar dextroscoliosis. Mild straightening of the normal lumbar lordosis. No significant listhesis in the sagittal plane.   Vertebrae: No fracture, suspicious marrow lesion, or significant marrow edema. Mild multilevel chronic degenerative endplate changes.   Conus medullaris and cauda equina: Conus extends to the T12-L1 level. Conus and cauda equina appear normal. No evidence of an epidural hematoma.   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   Disc levels:   Disc desiccation throughout the lumbar spine. Mild disc space narrowing from L1-2 through L3-4 and moderate to severe narrowing at L4-5 and L5-S1.   T12-L1: Negative.   L1-2: Mild disc bulging and mild facet hypertrophy without stenosis.   L2-3: Left eccentric disc bulging and mild facet and ligamentum flavum hypertrophy result in borderline to mild left greater than right lateral recess stenosis and mild left neural foraminal stenosis without spinal stenosis.   L3-4: Disc bulging and mild-to-moderate facet and ligamentum flavum hypertrophy result in mild bilateral lateral recess stenosis and borderline bilateral neural foraminal  stenosis without significant spinal stenosis.   L4-5: Disc bulging and mild facet and ligamentum flavum hypertrophy result in mild-to-moderate left greater than right lateral recess stenosis and mild right and borderline left neural foraminal stenosis without spinal stenosis.   L5-S1: Prior laminectomies. Disc bulging, endplate spurring, and mild facet hypertrophy without stenosis.   IMPRESSION: 1. No evidence of an epidural hematoma. 2. Multilevel lumbar disc and facet degeneration with multilevel lateral recess stenosis as above, greatest at L4-5.     Electronically Signed   By: Sebastian Ache M.D.   On: 12/26/2022 18:59   She reports that she has never smoked. She has been exposed to tobacco smoke. She has never used smokeless tobacco. No results for input(s): "HGBA1C", "LABURIC" in the last 8760 hours.  Objective:  VS:  HT:    WT:   BMI:     BP:   HR: bpm  TEMP: ( )  RESP:  Physical Exam Vitals and nursing note reviewed.  HENT:     Head: Normocephalic and atraumatic.     Right Ear: External ear normal.     Left Ear: External ear normal.     Nose: Nose normal.     Mouth/Throat:     Mouth: Mucous membranes are moist.  Eyes:     Extraocular Movements: Extraocular movements intact.  Cardiovascular:     Rate and Rhythm: Normal rate.  Pulses: Normal pulses.  Pulmonary:     Effort: Pulmonary effort is normal.  Abdominal:     General: Abdomen is flat. There is no distension.  Musculoskeletal:        General: Tenderness present.     Cervical back: Normal range of motion.     Comments: Pt is slow to rise from seated position to standing. Good lumbar range of motion. Strong distal strength without clonus, no pain upon palpation of greater trochanters. Sensation intact bilaterally. Dysesthesias noted to left L5 nerve pattern. Ambulates with walker, gait unsteady.  Skin:    General: Skin is warm and dry.     Capillary Refill: Capillary refill takes less than 2 seconds.   Neurological:     General: No focal deficit present.     Mental Status: She is alert and oriented to person, place, and time.  Psychiatric:        Mood and Affect: Mood normal.        Behavior: Behavior normal.     Ortho Exam  Imaging: No results found.  Past Medical/Family/Surgical/Social History: Medications & Allergies reviewed per EMR, new medications updated. 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   High risk medication use 04/17/2022   Vitamin B 12 deficiency 04/03/2022   Iron deficiency anemia 03/29/2022   Bilateral wrist pain 02/27/2022   Hemarthrosis 02/08/2022   Synovitis of left knee 07/19/2021   Vancomycin adverse reaction 04/19/2021   Therapeutic drug monitoring 04/19/2021   Synovitis of right knee 04/04/2020   Long term (current) use of anticoagulants [Z79.01] 03/14/2016   S/P minimally invasive mitral valve repair 03/06/2016   Pancreatic mass 01/29/2016   Thyroid nodule 01/29/2016   Exertional dyspnea 01/05/2016   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   Mitral insufficiency    Mitral valve prolapse 12/26/2015   Anxiety 11/02/2015   Past Medical History:  Diagnosis Date   Anemia    low iron in the past   Anxiety    pt denies   Arthritis    "back?" (11/03/2015) knees/hands (08/28/22)   Chest pain 11/02/2015   Depression    pt denies   Exertional dyspnea 01/05/2016   GERD (gastroesophageal reflux disease)    Heart murmur    History of peptic ulcer "late 1970's"   Migraine    "maybe 3-4/yr now" (11/03/2015)   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   Complex valvuloplasty including triangular resection of posterior leaflet, artificial Gore-tex neochord placement x6 and 34 mm Memo 3D ring annuloplasty via right mini thoracotomy approach with clipping of LA appendage   Thyroid nodule 01/29/2016   2.4 cm enhancing mixed cystic and solid nodule inferior left thyroid gland noted on CT angiogram   Family History  Problem Relation Age of Onset   Hypertension Mother    Cancer Mother 74  MELANOMA   Cancer Father        PROSTATE   Cancer Sister 67       BREAST   Past Surgical History:  Procedure Laterality Date   BACK SURGERY     BREAST BIOPSY Left ~ 2005   BREAST LUMPECTOMY Left ~ 2005   CARDIAC CATHETERIZATION N/A 01/16/2016   Procedure: Right/Left Heart Cath and Coronary Angiography;  Surgeon: Sanda Klein, MD;  Location: [facility];  Service: Cardiovascular;  Laterality: N/A;   CLIPPING OF ATRIAL APPENDAGE Left 03/06/2016   Procedure: CLIPPING OF LEFT ATRIAL APPENDAGE using a 65 PRO2 AtriClip;  Surgeon: Rexene Alberts, MD;  Location: [place];  Service: Open Heart Surgery;  Laterality: Left;   COLONOSCOPY     EUS N/A 02/20/2016   Procedure: ESOPHAGEAL ENDOSCOPIC ULTRASOUND (EUS) RADIAL;  Surgeon: Arta Silence, MD;  Location: [facility];  Service: Endoscopy;  Laterality: N/A;   EYE MUSCLE SURGERY Bilateral 1970's?   KNEE ARTHROSCOPY Right 04/04/2020   Procedure: RIGHT KNEE ARTHROSCOPY WITH DEBRIDEMENT;  Surgeon: Meredith Pel, MD;  Location: [place];  Service: Orthopedics;  Laterality: Right;   KNEE ARTHROSCOPY Right 04/10/2021   Procedure: right knee arthroscopy, debridement, placement of antibiotic beads;  Surgeon: Meredith Pel, MD;  Location: [place];   Service: Orthopedics;  Laterality: Right;   KNEE ARTHROSCOPY Left 12/10/2021   Procedure: LEFT KNEE ARTHROSCOPY, DEBRIDEMENT, PLACEMENT OF STIMULAN BEADS;  Surgeon: Meredith Pel, MD;  Location: [place];  Service: Orthopedics;  Laterality: Left;   [place]   "trimmed bulges off both sides"   MITRAL VALVE REPAIR Right 03/06/2016   Procedure: MINIMALLY INVASIVE MITRAL VALVE REPAIR (MVR) using a 48 Sorin Memo 3D Ring;  Surgeon: Rexene Alberts, MD;  Location: [place];  Service: Open Heart Surgery;  Laterality: Right;   TEE WITHOUT CARDIOVERSION N/A 01/05/2016   Procedure: TRANSESOPHAGEAL ECHOCARDIOGRAM (TEE);  Surgeon: Sanda Klein, MD;  Location: [facility];  Service: Cardiovascular;  Laterality: N/A;   TEE WITHOUT CARDIOVERSION N/A 03/06/2016   Procedure: TRANSESOPHAGEAL ECHOCARDIOGRAM (TEE);  Surgeon: Rexene Alberts, MD;  Location: [place];  Service: Open Heart Surgery;  Laterality: N/A;   TOTAL KNEE ARTHROPLASTY Right 09/03/2022   Procedure: RIGHT TOTAL KNEE ARTHROPLASTY-CEMENTED;  Surgeon: Meredith Pel, MD;  Location: [place];  Service: Orthopedics;  Laterality: Right;   TOTAL KNEE ARTHROPLASTY Left 12/12/2022   Procedure: LEFT TOTAL KNEE ARTHROPLASTY;  Surgeon: Meredith Pel, MD;  Location: [place];  Service: Orthopedics;  Laterality: Left;   TUBAL LIGATION     Social History   Occupational History   Not on file  Tobacco Use   Smoking status: Never    Passive exposure: Current   Smokeless tobacco: Never  Vaping Use   Vaping Use: Never used  Substance and Sexual Activity   Alcohol use: Yes    Alcohol/week: 10.0 standard drinks of alcohol    Types: 6 Glasses of wine, 4 Shots of liquor per week    Comment: wine every other day   Drug use: No   Sexual activity: Not Currently    Birth control/protection: Post-menopausal

## 2023-01-06 NOTE — Therapy (Signed)
OUTPATIENT PHYSICAL THERAPY EVALUATION   Patient Name: Becky Murphy MRN: [id number] DOB:[birth year], 72 y.o., female Today's Date: 01/06/2023  END OF SESSION:  PT End of Session - 01/06/23 1307     Visit Number 1    Number of Visits 20    Date for PT Re-Evaluation 03/17/23    Authorization Type Medicare    Progress Note Due on Visit 10    PT Start Time 1308    PT Stop Time 1341    PT Time Calculation (min) 33 min    Activity Tolerance Patient tolerated treatment well    Behavior During Therapy [facility] for tasks assessed/performed             Past Medical History:  Diagnosis Date   Anemia    low iron in the past   Anxiety    pt denies   Arthritis    "back?" (11/03/2015) knees/hands (08/28/22)   Chest pain 11/02/2015   Depression    pt denies   Exertional dyspnea 01/05/2016   GERD (gastroesophageal reflux disease)    Heart murmur    History of peptic ulcer "late 1970's"   Migraine    "maybe 3-4/yr now" (11/03/2015)   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   Complex valvuloplasty including triangular resection of posterior leaflet, artificial Gore-tex neochord placement x6 and 34 mm Memo 3D ring annuloplasty via right mini thoracotomy approach with clipping of LA appendage   Thyroid nodule 01/29/2016   2.4 cm enhancing mixed cystic and solid nodule inferior left thyroid gland noted on CT angiogram   Past Surgical History:  Procedure Laterality Date   BACK SURGERY     BREAST BIOPSY Left ~ 2005   BREAST LUMPECTOMY Left ~ 2005   CARDIAC CATHETERIZATION N/A 01/16/2016   Procedure:  Right/Left Heart Cath and Coronary Angiography;  Surgeon: Sanda Klein, MD;  Location: [place] [facility];  Service: Cardiovascular;  Laterality: N/A;   CLIPPING OF ATRIAL APPENDAGE Left 03/06/2016   Procedure: CLIPPING OF LEFT ATRIAL APPENDAGE using a 3 PRO2 AtriClip;  Surgeon: Rexene Alberts, MD;  Location: [facility];  Service: Open Heart Surgery;  Laterality: Left;   COLONOSCOPY     EUS N/A 02/20/2016   Procedure: ESOPHAGEAL ENDOSCOPIC ULTRASOUND (EUS) RADIAL;  Surgeon: Arta Silence, MD;  Location: [facility];  Service: Endoscopy;  Laterality: N/A;   EYE MUSCLE SURGERY Bilateral 1970's?   KNEE ARTHROSCOPY Right 04/04/2020   Procedure: RIGHT KNEE ARTHROSCOPY WITH DEBRIDEMENT;  Surgeon: Meredith Pel, MD;  Location: [place];  Service: Orthopedics;  Laterality: Right;   KNEE ARTHROSCOPY Right 04/10/2021   Procedure: right knee arthroscopy, debridement, placement of antibiotic beads;  Surgeon: Meredith Pel, MD;  Location: [place];  Service: Orthopedics;  Laterality: Right;   KNEE ARTHROSCOPY Left 12/10/2021   Procedure: LEFT KNEE ARTHROSCOPY, DEBRIDEMENT, PLACEMENT OF STIMULAN BEADS;  Surgeon: Meredith Pel, MD;  Location: [place];  Service: Orthopedics;  Laterality: Left;   [place]   "trimmed bulges off both sides"   MITRAL VALVE REPAIR Right 03/06/2016   Procedure: MINIMALLY INVASIVE MITRAL VALVE REPAIR (  MVR) using a 34 Sorin Memo 3D Ring;  Surgeon: Purcell Nails, MD;  Location: [facility];  Service: Open Heart Surgery;  Laterality: Right;   TEE WITHOUT CARDIOVERSION N/A 01/05/2016   Procedure: TRANSESOPHAGEAL ECHOCARDIOGRAM (TEE);  Surgeon: Thurmon Fair, MD;  Location: [facility];  Service: Cardiovascular;  Laterality: N/A;   TEE WITHOUT CARDIOVERSION N/A 03/06/2016   Procedure: TRANSESOPHAGEAL ECHOCARDIOGRAM (TEE);  Surgeon: Purcell Nails, MD;  Location: [facility] OR;  Service: Open Heart Surgery;  Laterality: N/A;   TOTAL KNEE ARTHROPLASTY Right 09/03/2022   Procedure:  RIGHT TOTAL KNEE ARTHROPLASTY-CEMENTED;  Surgeon: Cammy Copa, MD;  Location: [facility] OR;  Service: Orthopedics;  Laterality: Right;   TOTAL KNEE ARTHROPLASTY Left 12/12/2022   Procedure: LEFT TOTAL KNEE ARTHROPLASTY;  Surgeon: Cammy Copa, MD;  Location: [facility] OR;  Service: Orthopedics;  Laterality: Left;   TUBAL LIGATION     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   High risk medication use 04/17/2022   Vitamin B 12 deficiency 04/03/2022   Iron deficiency anemia 03/29/2022   Bilateral wrist pain 02/27/2022   Hemarthrosis 02/08/2022   Synovitis of left knee 07/19/2021   Vancomycin adverse reaction 04/19/2021   Therapeutic drug monitoring 04/19/2021   Synovitis of right knee 04/04/2020   Long term (current) use of anticoagulants [Z79.01] 03/14/2016   S/P minimally invasive mitral valve repair 03/06/2016   Pancreatic mass 01/29/2016   Thyroid nodule 01/29/2016   Exertional dyspnea 01/05/2016   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  Pt indicated Lt knee has been more troublesome post surgery compared to history of Rt  TKA.  Pt indicated having severe pain in back and into Lt since surgery was performed.  Negative doppler for DVT.  MRI was also performed on back as well.    Pt indicated Lt knee pain itself is very similar to what was expected for her.    PERTINENT HISTORY: MVR, arthritis, back surgery.  Negative doppler for DVT 12/26/2022 PAIN:  NPRS scale: at current 9/10 Pain location: Lt knee from surgery (also has back symptoms) Pain description: ache,constant Aggravating factors: standing, walking, lifting and moving leg Relieving factors: no specific actions  PRECAUTIONS: None  WEIGHT BEARING RESTRICTIONS: No  FALLS:  Has patient fallen in last 6 months? No  LIVING ENVIRONMENT: Lives with: lives with their spouse Lives in: House/apartment Stairs: 1 step to enter no handrail Has following equipment at home: FWW, SPC  OCCUPATION: Retired  PLOF: Independent, work in Biomedical scientist (goal)  PATIENT GOALS: Reduce pain, start walking independently, do yard/flower work.    OBJECTIVE:   PATIENT SURVEYS:  01/06/2023 FOTO intake: 56    predicted:  62  COGNITION: 01/06/2023 Overall cognitive status: WFL    SENSATION: 01/06/2023 No testing today  EDEMA:  01/06/2023 Visual edema around Lt knee  MUSCLE LENGTH: 01/06/2023 No specific testing today  POSTURE: 01/06/2023 Forward trunk lean, forward head  PALPATION: 01/06/2023 General tenderness around Lt knee   LOWER EXTREMITY ROM:   ROM Right 01/06/2023 Left 01/06/2023  Hip flexion    Hip extension    Hip abduction    Hip adduction    Hip internal rotation    Hip external rotation    Knee flexion  115 in supine AROM heel slide c pain  Knee extension  -26 AROM in seated LAQ  PROM in heel prop -9  Ankle dorsiflexion    Ankle plantarflexion    Ankle inversion    Ankle eversion     (Blank rows = not tested)  LOWER EXTREMITY MMT:  MMT Right 01/06/2023 Left 01/06/2023  Hip flexion 5/5 4/5  Hip extension    Hip abduction    Hip adduction     Hip internal rotation    Hip external rotation    Knee flexion 5/5 4/5  Knee extension 5/5 2/5  Ankle dorsiflexion 5/5 4/5  Ankle plantarflexion    Ankle inversion    Ankle eversion     (Blank rows = not tested)  LOWER EXTREMITY SPECIAL TESTS:  01/06/2023 None performed in clinic   FUNCTIONAL TESTS:  01/06/2023 18 inch chair transfer: unable without UE assist  Lt SLS: unable unassisted Rt SLS: not tested  GAIT: 01/06/2023 In clinic FWW c step to gait pattern, increased forward trunk lean.  Antalgic gait on Lt leg.    TODAY'S TREATMENT                                                                          DATE:  01/06/2023 Therex: HEP instruction/performance c cues for techniques, handout provided.  Trial set performed of each for comprehension and symptom assessment.  See below for exercise list  Vaso: 34 degrees Lt knee medium compression 10 mins in elevation    PATIENT EDUCATION:  01/06/2023 Education details: HEP, POC Person educated: Patient Education method: Consulting civil engineer, Demonstration, Verbal cues, and Handouts Education comprehension: verbalized understanding, returned demonstration, and verbal cues required  HOME EXERCISE PROGRAM: Access Code: [email or web address] URL: [email or web address] Date: 01/06/2023 Prepared by: Scot Jun  Exercises - Supine Heel Slide (Mirrored)  - 3-5 x daily - 7 x weekly - 1 sets - 10 reps - 5 hold - Seated Long Arc Quad (Mirrored)  - 3-5 x daily - 7 x weekly - 1 sets - 5-10 reps - 2 hold - Supine Knee Extension Mobilization with Weight (Mirrored)  - 4-5 x daily - 7 x weekly - 1 sets - 1 reps - to tolerance up to 15 mins hold - Seated Quad Set (Mirrored)  - 3-5 x daily - 7 x weekly - 1 sets - 10 reps - 5 hold  ASSESSMENT:  CLINICAL IMPRESSION: Patient is a 72 y.o. who comes to clinic with complaints of Lt knee pain with mobility, strength and movement coordination deficits that impair their ability to perform usual daily and  recreational functional activities without increase difficulty/symptoms at this time.  Patient to benefit from  skilled PT services to address impairments and limitations to improve to previous level of function without restriction secondary to condition.   OBJECTIVE IMPAIRMENTS: Abnormal gait, decreased activity tolerance, decreased balance, decreased coordination, decreased endurance, decreased mobility, difficulty walking, decreased ROM, decreased strength, hypomobility, increased edema, increased fascial restrictions, impaired perceived functional ability, impaired flexibility, improper body mechanics, and postural dysfunction  ACTIVITY LIMITATIONS: lifting, bending, sitting, standing, squatting, sleeping, stairs, transfers, bed mobility, dressing, and locomotion level  PARTICIPATION LIMITATIONS: interpersonal relationship, shopping, community activity, and yard work  PERSONAL FACTORS:  Concurrent back/radicular symptoms in same leg  are also affecting patient's functional outcome.   REHAB POTENTIAL: Good  CLINICAL DECISION MAKING: Stable/uncomplicated  EVALUATION COMPLEXITY: Low   GOALS: Goals reviewed with patient? Yes  SHORT TERM GOALS: (target date for Short term goals are 3 weeks 01/27/2023)   1.  Patient will demonstrate independent use of home exercise program to maintain progress from in clinic treatments.  Goal status: New  LONG TERM GOALS: (target dates for all long term goals are 10 weeks  03/17/2023 )   1. Patient will demonstrate/report pain at worst less than or equal to 2/10 to facilitate minimal limitation in daily activity secondary to pain symptoms.  Goal status: New   2. Patient will demonstrate independent use of home exercise program to facilitate ability to maintain/progress functional gains from skilled physical therapy services.  Goal status: New   3. Patient will demonstrate FOTO outcome > or = 62 % to indicate reduced disability due to condition.  Goal  status: New   4.  Patient will demonstrate Lt LE MMT 5/5 throughout to faciltiate usual transfers, stairs, squatting at [facility] for daily life.   Goal status: New   5.  Patient will demonstrate Lt knee AROM 0-110 degrees to facilitate transfers, ambulation, standing/walking at PLOF.  Goal status: New   6.  Patient will demonstrate independent ambulation > 300 ft to facilitate community integration s antalgic gait.  Goal status: New   7.  Patient will demonstrate ability to ascend/descend several stairs s UE assist c reciprocal gait pattern for household and community integration.  Goal Status: New   PLAN:  PT FREQUENCY: 1-2x/week  PT DURATION: 10 weeks  PLANNED INTERVENTIONS: Therapeutic exercises, Therapeutic activity, Neuro Muscular re-education, Balance training, Gait training, Patient/Family education, Joint mobilization, Stair training, DME instructions, Dry Needling, Electrical stimulation, Traction, Cryotherapy, vasopneumatic deviceMoist heat, Taping, Ultrasound, Ionotophoresis 4mg /ml Dexamethasone, and Manual therapy.  All included unless contraindicated  PLAN FOR NEXT SESSION: Review HEP knowledge/results.   Extension focus c strengthening.    Scot Jun, PT, DPT, OCS, ATC 01/06/23  1:38 PM

## 2023-01-08 ENCOUNTER — Inpatient Hospital Stay: Payer: Medicare Other

## 2023-01-08 ENCOUNTER — Ambulatory Visit (INDEPENDENT_AMBULATORY_CARE_PROVIDER_SITE_OTHER): Payer: Medicare Other | Admitting: Physical Therapy

## 2023-01-08 ENCOUNTER — Encounter: Payer: Self-pay | Admitting: Physical Therapy

## 2023-01-08 DIAGNOSIS — R262 Difficulty in walking, not elsewhere classified: Secondary | ICD-10-CM

## 2023-01-08 DIAGNOSIS — M6281 Muscle weakness (generalized): Secondary | ICD-10-CM | POA: Diagnosis not present

## 2023-01-08 DIAGNOSIS — R600 Localized edema: Secondary | ICD-10-CM

## 2023-01-08 DIAGNOSIS — M25562 Pain in left knee: Secondary | ICD-10-CM | POA: Diagnosis not present

## 2023-01-08 DIAGNOSIS — G8929 Other chronic pain: Secondary | ICD-10-CM

## 2023-01-08 DIAGNOSIS — M25662 Stiffness of left knee, not elsewhere classified: Secondary | ICD-10-CM | POA: Diagnosis not present

## 2023-01-08 NOTE — Therapy (Signed)
OUTPATIENT PHYSICAL THERAPY TREATMENT   Patient Name: Becky Murphy MRN: [id number] DOB:28-Jun-1951, 72 y.o., female Today's Date: 01/08/2023  END OF SESSION:  PT End of Session - 01/08/23 0946     Visit Number 2    Number of Visits 20    Date for PT Re-Evaluation 03/17/23    Authorization Type Medicare    Progress Note Due on Visit 10    PT Start Time [phone]   pt late this morning   PT Stop Time 1012    PT Time Calculation (min) 31 min    Activity Tolerance Patient tolerated treatment well    Behavior During Therapy [facility] for tasks assessed/performed              Past Medical History:  Diagnosis Date   Anemia    low iron in the past   Anxiety    pt denies   Arthritis    "back?" (11/03/2015) knees/hands (08/28/22)   Chest pain 11/02/2015   Depression    pt denies   Exertional dyspnea 01/05/2016   GERD (gastroesophageal reflux disease)    Heart murmur    History of peptic ulcer "late 1970's"   Migraine    "maybe 3-4/yr now" (11/03/2015)   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   Complex valvuloplasty including triangular resection of posterior leaflet, artificial Gore-tex neochord placement x6 and 34 mm Memo 3D ring annuloplasty via right mini thoracotomy approach with clipping of LA appendage   Thyroid nodule 01/29/2016   2.4 cm enhancing mixed cystic and solid nodule inferior left thyroid gland noted on CT angiogram   Past Surgical History:  Procedure Laterality Date   BACK SURGERY     BREAST BIOPSY Left ~ 2005   BREAST LUMPECTOMY Left ~ 2005   CARDIAC CATHETERIZATION N/A 01/16/2016    Procedure: Right/Left Heart Cath and Coronary Angiography;  Surgeon: Thurmon Fair, MD;  Location: [facility] INVASIVE CV LAB;  Service: Cardiovascular;  Laterality: N/A;   CLIPPING OF ATRIAL APPENDAGE Left 03/06/2016   Procedure: CLIPPING OF LEFT ATRIAL APPENDAGE using a 45 PRO2 AtriClip;  Surgeon: Purcell Nails, MD;  Location: [facility] OR;  Service: Open Heart Surgery;  Laterality: Left;   COLONOSCOPY     EUS N/A 02/20/2016   Procedure: ESOPHAGEAL ENDOSCOPIC ULTRASOUND (EUS) RADIAL;  Surgeon: Willis Modena, MD;  Location: [facility] ENDOSCOPY;  Service: Endoscopy;  Laterality: N/A;   EYE MUSCLE SURGERY Bilateral 1970's?   KNEE ARTHROSCOPY Right 04/04/2020   Procedure: RIGHT KNEE ARTHROSCOPY WITH DEBRIDEMENT;  Surgeon: Cammy Copa, MD;  Location: [facility] OR;  Service: Orthopedics;  Laterality: Right;   KNEE ARTHROSCOPY Right 04/10/2021   Procedure: right knee arthroscopy, debridement, placement of antibiotic beads;  Surgeon: Cammy Copa, MD;  Location: [facility] OR;  Service: Orthopedics;  Laterality: Right;   KNEE ARTHROSCOPY Left 12/10/2021   Procedure: LEFT KNEE ARTHROSCOPY, DEBRIDEMENT, PLACEMENT OF STIMULAN BEADS;  Surgeon: Cammy Copa, MD;  Location: [facility] OR;  Service: Orthopedics;  Laterality: Left;   LUMBAR DISC SURGERY  1992   "trimmed bulges off both sides"   MITRAL VALVE REPAIR Right 03/06/2016  Procedure: MINIMALLY INVASIVE MITRAL VALVE REPAIR (MVR) using a 34 Sorin Memo 3D Ring;  Surgeon: Rexene Alberts, MD;  Location: [place];  Service: Open Heart Surgery;  Laterality: Right;   TEE WITHOUT CARDIOVERSION N/A 01/05/2016   Procedure: TRANSESOPHAGEAL ECHOCARDIOGRAM (TEE);  Surgeon: Sanda Klein, MD;  Location: [facility] ENDOSCOPY;  Service: Cardiovascular;  Laterality: N/A;   TEE WITHOUT CARDIOVERSION N/A 03/06/2016   Procedure: TRANSESOPHAGEAL ECHOCARDIOGRAM (TEE);  Surgeon: Rexene Alberts, MD;  Location: [place];  Service: Open Heart Surgery;  Laterality: N/A;   TOTAL KNEE ARTHROPLASTY Right 09/03/2022    Procedure: RIGHT TOTAL KNEE ARTHROPLASTY-CEMENTED;  Surgeon: Meredith Pel, MD;  Location: [place];  Service: Orthopedics;  Laterality: Right;   TOTAL KNEE ARTHROPLASTY Left 12/12/2022   Procedure: LEFT TOTAL KNEE ARTHROPLASTY;  Surgeon: Meredith Pel, MD;  Location: [place];  Service: Orthopedics;  Laterality: Left;   TUBAL LIGATION     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   High risk medication use 04/17/2022   Vitamin B 12 deficiency 04/03/2022   Iron deficiency anemia 03/29/2022   Bilateral wrist pain 02/27/2022   Hemarthrosis 02/08/2022   Synovitis of left knee 07/19/2021   Vancomycin adverse reaction 04/19/2021   Therapeutic drug monitoring 04/19/2021   Synovitis of right knee 04/04/2020   Long term (current) use of anticoagulants [Z79.01] 03/14/2016   S/P minimally invasive mitral valve repair 03/06/2016   Pancreatic mass 01/29/2016   Thyroid nodule 01/29/2016   Exertional dyspnea 01/05/2016   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 Knee is still very painful. I went to the MD after therapy on Monday to talk about a shot for the  pain coming down my leg, its so severe. Doctor thinks there might be a little something like pressure on the nerves around my spine, working with insurance to try to get this taken care of. Right knee was like a dream when I had it replaced, left has not been so great for me due to pain.   PERTINENT HISTORY: MVR, arthritis, back surgery.  Negative doppler for DVT 12/26/2022 PAIN:  NPRS scale: at current 8/10 Pain location: Lt knee from surgery (also has back symptoms) Pain description: ache,constant, "it just wears you down"  Aggravating factors: standing, walking, lifting and moving leg Relieving factors: doubling oxy, mm relaxant,  and gabapentin at night to be able to sleep   PRECAUTIONS: None  WEIGHT BEARING RESTRICTIONS: No  FALLS:  Has patient fallen in last 6 months? No  LIVING ENVIRONMENT: Lives with: lives with their spouse Lives in: House/apartment Stairs: 1 step to enter no handrail Has following equipment at home: FWW, [place]: Retired  PLOF: Independent, work  in flowers (goal)  PATIENT GOALS: Reduce pain, start walking independently, do yard/flower work.    OBJECTIVE:   PATIENT SURVEYS:  01/06/2023 FOTO intake: 56    predicted:  62  COGNITION: 01/06/2023 Overall cognitive status: WFL    SENSATION: 01/06/2023 No testing today  EDEMA:  01/06/2023 Visual edema around Lt knee  MUSCLE LENGTH: 01/06/2023 No specific testing today  POSTURE: 01/06/2023 Forward trunk lean, forward head  PALPATION: 01/06/2023 General tenderness around Lt knee   LOWER EXTREMITY ROM:   ROM Right 01/06/2023 Left 01/06/2023 L 01/08/23  Hip flexion     Hip extension     Hip abduction     Hip adduction     Hip internal rotation     Hip external rotation     Knee flexion  115 in supine AROM heel slide c pain   Knee extension  -26 AROM in seated LAQ  PROM in heel prop -9 AROM -17* supine with heel prop  Ankle dorsiflexion     Ankle plantarflexion     Ankle inversion      Ankle eversion      (Blank rows = not tested)  LOWER EXTREMITY MMT:  MMT Right 01/06/2023 Left 01/06/2023  Hip flexion 5/5 4/5  Hip extension    Hip abduction    Hip adduction    Hip internal rotation    Hip external rotation    Knee flexion 5/5 4/5  Knee extension 5/5 2/5  Ankle dorsiflexion 5/5 4/5  Ankle plantarflexion    Ankle inversion    Ankle eversion     (Blank rows = not tested)  LOWER EXTREMITY SPECIAL TESTS:  01/06/2023 None performed in clinic   FUNCTIONAL TESTS:  01/06/2023 18 inch chair transfer: unable without UE assist  Lt SLS: unable unassisted Rt SLS: not tested  GAIT: 01/06/2023 In clinic FWW c step to gait pattern, increased forward trunk lean.  Antalgic gait on Lt leg.    TODAY'S TREATMENT                                                                          DATE:    01/08/23  TherEx  Supine knee extension stretch 3# with heel prop x3 minutes Seated LAQs 3# 2x5 with 3 second hold/eccentric lower    Manual    Tennis ball STM to L quad with LE in elevation  Knee extension OP x10 supine with heel prop  01/06/2023 Therex: HEP instruction/performance c cues for techniques, handout provided.  Trial set performed of each for comprehension and symptom assessment.  See below for exercise list  Vaso: 34 degrees Lt knee medium compression 10 mins in elevation    PATIENT EDUCATION:  01/06/2023 Education details: tennis ball STM to quad at home  Person educated: Patient Education method: Explanation, Demonstration, Verbal cues, and Handouts Education comprehension: verbalized understanding, returned demonstration, and verbal cues required  HOME EXERCISE PROGRAM: Access Code: [email or web address] URL: [email or web address] Date: 01/06/2023 Prepared by: Chyrel Masson  Exercises - Supine Heel Slide (Mirrored)  - 3-5 x daily - 7 x weekly - 1 sets - 10 reps - 5 hold - Seated Long Arc Quad (Mirrored)  - 3-5 x daily - 7 x weekly - 1 sets -  5-10  reps - 2 hold - Supine Knee Extension Mobilization with Weight (Mirrored)  - 4-5 x daily - 7 x weekly - 1 sets - 1 reps - to tolerance up to 15 mins hold - Seated Quad Set (Mirrored)  - 3-5 x daily - 7 x weekly - 1 sets - 10 reps - 5 hold  ASSESSMENT:  CLINICAL IMPRESSION:  Kahdijah arrives  a bit late this morning, still having quite a bit of pain in her L LE. Tried some manual interventions to reduce pain and improve extension ROM this morning, also worked on strength as time allowed. Patella and scar are moving well. Will continue efforts, hopefully she is able to get injection for her back and it assists in pain control. Session limited by time this morning.   OBJECTIVE IMPAIRMENTS: Abnormal gait, decreased activity tolerance, decreased balance, decreased coordination, decreased endurance, decreased mobility, difficulty walking, decreased ROM, decreased strength, hypomobility, increased edema, increased fascial restrictions, impaired perceived functional ability, impaired flexibility, improper body mechanics, and postural dysfunction  ACTIVITY LIMITATIONS: lifting, bending, sitting, standing, squatting, sleeping, stairs, transfers, bed mobility, dressing, and locomotion level  PARTICIPATION LIMITATIONS: interpersonal relationship, shopping, community activity, and yard work  PERSONAL FACTORS:  Concurrent back/radicular symptoms in same leg  are also affecting patient's functional outcome.   REHAB POTENTIAL: Good  CLINICAL DECISION MAKING: Stable/uncomplicated  EVALUATION COMPLEXITY: Low   GOALS: Goals reviewed with patient? Yes  SHORT TERM GOALS: (target date for Short term goals are 3 weeks 01/27/2023)   1.  Patient will demonstrate independent use of home exercise program to maintain progress from in clinic treatments.  Goal status: New  LONG TERM GOALS: (target dates for all long term goals are 10 weeks  03/17/2023 )   1. Patient will demonstrate/report pain at worst less than  or equal to 2/10 to facilitate minimal limitation in daily activity secondary to pain symptoms.  Goal status: New   2. Patient will demonstrate independent use of home exercise program to facilitate ability to maintain/progress functional gains from skilled physical therapy services.  Goal status: New   3. Patient will demonstrate FOTO outcome > or = 62 % to indicate reduced disability due to condition.  Goal status: New   4.  Patient will demonstrate Lt LE MMT 5/5 throughout to faciltiate usual transfers, stairs, squatting at [facility] for daily life.   Goal status: New   5.  Patient will demonstrate Lt knee AROM 0-110 degrees to facilitate transfers, ambulation, standing/walking at PLOF.  Goal status: New   6.  Patient will demonstrate independent ambulation > 300 ft to facilitate community integration s antalgic gait.  Goal status: New   7.  Patient will demonstrate ability to ascend/descend several stairs s UE assist c reciprocal gait pattern for household and community integration.  Goal Status: New   PLAN:  PT FREQUENCY: 1-2x/week  PT DURATION: 10 weeks  PLANNED INTERVENTIONS: Therapeutic exercises, Therapeutic activity, Neuro Muscular re-education, Balance training, Gait training, Patient/Family education, Joint mobilization, Stair training, DME instructions, Dry Needling, Electrical stimulation, Traction, Cryotherapy, vasopneumatic deviceMoist heat, Taping, Ultrasound, Ionotophoresis 4mg /ml Dexamethasone, and Manual therapy.  All included unless contraindicated  PLAN FOR NEXT SESSION: Review HEP knowledge/results.   Extension focus c strengthening.     Deniece Ree PT DPT PN2  01/08/2023, 10:14 AM

## 2023-01-13 ENCOUNTER — Ambulatory Visit: Payer: Medicare Other | Admitting: Physical Therapy

## 2023-01-13 ENCOUNTER — Ambulatory Visit (INDEPENDENT_AMBULATORY_CARE_PROVIDER_SITE_OTHER): Payer: Medicare Other | Admitting: Rehabilitative and Restorative Service Providers"

## 2023-01-13 ENCOUNTER — Encounter: Payer: Self-pay | Admitting: Rehabilitative and Restorative Service Providers"

## 2023-01-13 DIAGNOSIS — M25562 Pain in left knee: Secondary | ICD-10-CM

## 2023-01-13 DIAGNOSIS — R262 Difficulty in walking, not elsewhere classified: Secondary | ICD-10-CM

## 2023-01-13 DIAGNOSIS — M25662 Stiffness of left knee, not elsewhere classified: Secondary | ICD-10-CM | POA: Diagnosis not present

## 2023-01-13 DIAGNOSIS — M6281 Muscle weakness (generalized): Secondary | ICD-10-CM

## 2023-01-13 DIAGNOSIS — R600 Localized edema: Secondary | ICD-10-CM

## 2023-01-13 DIAGNOSIS — G8929 Other chronic pain: Secondary | ICD-10-CM

## 2023-01-13 NOTE — Therapy (Signed)
OUTPATIENT PHYSICAL THERAPY TREATMENT   Patient Name: Becky Murphy MRN: [id number] DOB:09-12-1951, 72 y.o., female Today's Date: 01/13/2023  END OF SESSION:  PT End of Session - 01/13/23 1022     Visit Number 3    Number of Visits 20    Date for PT Re-Evaluation 03/17/23    Authorization Type Medicare    Progress Note Due on Visit 10    PT Start Time 1014    PT Stop Time 1103    PT Time Calculation (min) 49 min    Activity Tolerance Patient tolerated treatment well    Behavior During Therapy WFL for tasks assessed/performed               Past Medical History:  Diagnosis Date   Anemia    low iron in the past   Anxiety    pt denies   Arthritis    "back?" (11/03/2015) knees/hands (08/28/22)   Chest pain 11/02/2015   Depression    pt denies   Exertional dyspnea 01/05/2016   GERD (gastroesophageal reflux disease)    Heart murmur    History of peptic ulcer "late 1970's"   Migraine    "maybe 3-4/yr now" (11/03/2015)   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   Complex valvuloplasty including triangular resection of posterior leaflet, artificial Gore-tex neochord placement x6 and 34 mm Memo 3D ring annuloplasty via right mini thoracotomy approach with clipping of LA appendage   Thyroid nodule 01/29/2016   2.4 cm enhancing mixed cystic and solid nodule inferior left thyroid gland noted on CT angiogram   Past Surgical History:  Procedure Laterality Date   BACK SURGERY     BREAST BIOPSY Left ~ 2005   BREAST LUMPECTOMY Left ~ 2005   CARDIAC CATHETERIZATION N/A 01/16/2016   Procedure:  Right/Left Heart Cath and Coronary Angiography;  Surgeon: Sanda Klein, MD;  Location: [facility];  Service: Cardiovascular;  Laterality: N/A;   CLIPPING OF ATRIAL APPENDAGE Left 03/06/2016   Procedure: CLIPPING OF LEFT ATRIAL APPENDAGE using a 10 PRO2 AtriClip;  Surgeon: Rexene Alberts, MD;  Location: [place];  Service: Open Heart Surgery;  Laterality: Left;   COLONOSCOPY     EUS N/A 02/20/2016   Procedure: ESOPHAGEAL ENDOSCOPIC ULTRASOUND (EUS) RADIAL;  Surgeon: Arta Silence, MD;  Location: [facility];  Service: Endoscopy;  Laterality: N/A;   EYE MUSCLE SURGERY Bilateral 1970's?   KNEE ARTHROSCOPY Right 04/04/2020   Procedure: RIGHT KNEE ARTHROSCOPY WITH DEBRIDEMENT;  Surgeon: Meredith Pel, MD;  Location: [facility];  Service: Orthopedics;  Laterality: Right;   KNEE ARTHROSCOPY Right 04/10/2021   Procedure: right knee arthroscopy, debridement, placement of antibiotic beads;  Surgeon: Meredith Pel, MD;  Location: [place];  Service: Orthopedics;  Laterality: Right;   KNEE ARTHROSCOPY Left 12/10/2021   Procedure: LEFT KNEE ARTHROSCOPY, DEBRIDEMENT, PLACEMENT OF STIMULAN BEADS;  Surgeon: Meredith Pel, MD;  Location: [place];  Service: Orthopedics;  Laterality: Left;   [place]   "trimmed bulges off both sides"   MITRAL VALVE REPAIR Right 03/06/2016   Procedure: MINIMALLY INVASIVE MITRAL  VALVE REPAIR (MVR) using a 34 Sorin Memo 3D Ring;  Surgeon: Purcell Nails, MD;  Location: [facility];  Service: Open Heart Surgery;  Laterality: Right;   TEE WITHOUT CARDIOVERSION N/A 01/05/2016   Procedure: TRANSESOPHAGEAL ECHOCARDIOGRAM (TEE);  Surgeon: Thurmon Fair, MD;  Location[facility];  Service: Cardiovascular;  Laterality: N/A;   TEE WITHOUT CARDIOVERSION N/A 03/06/2016   Procedure: TRANSESOPHAGEAL ECHOCARDIOGRAM (TEE);  Surgeon: Purcell Nails, MD;  Location: [facility];  Service: Open Heart Surgery;  Laterality: N/A;   TOTAL KNEE ARTHROPLASTY Right 09/03/2022   Procedure:  RIGHT TOTAL KNEE ARTHROPLASTY-CEMENTED;  Surgeon: Cammy Copa, MD;  Location: 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   High risk medication use 04/17/2022   Vitamin B 12 deficiency 04/03/2022   Iron deficiency anemia 03/29/2022   Bilateral wrist pain 02/27/2022   Hemarthrosis 02/08/2022   Synovitis of left knee 07/19/2021   Vancomycin adverse reaction 04/19/2021   Therapeutic drug monitoring 04/19/2021   Synovitis of right knee 04/04/2020   Long term (current) use of anticoagulants [Z79.01] 03/14/2016   S/P minimally invasive mitral valve repair 03/06/2016   Pancreatic mass 01/29/2016   Thyroid nodule 01/29/2016   Exertional dyspnea 01/05/2016   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  Pt indicated it was some better than previous.    PERTINENT  HISTORY: MVR, arthritis, back surgery.  Negative doppler for DVT 12/26/2022 PAIN:  NPRS scale: 3/10 for knee, 7/10 Pain location: Lt knee from surgery (also has back symptoms) Pain description: ache,constant, "it just wears you down"  Aggravating factors: standing, walking, lifting and moving leg Relieving factors: doubling oxy, mm relaxant,  and gabapentin at night to be able to sleep   PRECAUTIONS: None  WEIGHT BEARING RESTRICTIONS: No  FALLS:  Has patient fallen in last 6 months? No  LIVING ENVIRONMENT: Lives with: lives with their spouse Lives in: House/apartment Stairs: 1 step to enter no handrail Has following equipment at home: FWW, SPC  OCCUPATION: Retired  PLOF: Independent, work in Biomedical scientist (goal)  PATIENT GOALS: Reduce pain, start walking independently, do yard/flower work.    OBJECTIVE:   PATIENT SURVEYS:  01/06/2023 FOTO intake: 56    predicted:  62  COGNITION: 01/06/2023 Overall cognitive status: WFL    SENSATION: 01/06/2023 No testing today  EDEMA:  01/06/2023 Visual edema around Lt knee  MUSCLE LENGTH: 01/06/2023 No specific testing today  POSTURE: 01/06/2023 Forward trunk lean, forward head  PALPATION: 01/06/2023 General tenderness around Lt knee   LOWER EXTREMITY ROM:   ROM Right 01/06/2023 Left 01/06/2023 Left 01/08/23 Left 01/13/2023  Hip flexion      Hip extension      Hip abduction      Hip adduction      Hip internal rotation      Hip external rotation      Knee flexion  115 in supine AROM heel slide c pain    Knee extension  -26 AROM in seated LAQ  PROM in heel prop -9 AROM -17* supine with heel prop -14 AROM in seated LAQ  Ankle dorsiflexion      Ankle plantarflexion      Ankle inversion      Ankle eversion       (Blank rows = not tested)  LOWER EXTREMITY MMT:  MMT Right 01/06/2023 Left 01/06/2023  Hip flexion 5/5 4/5  Hip extension    Hip abduction    Hip adduction    Hip internal rotation    Hip external rotation    Knee  flexion 5/5 4/5  Knee extension 5/5 2/5  Ankle dorsiflexion 5/5 4/5  Ankle plantarflexion    Ankle inversion    Ankle eversion     (Blank rows = not tested)  LOWER EXTREMITY SPECIAL TESTS:  01/06/2023 None performed in clinic   FUNCTIONAL TESTS:  01/06/2023 18 inch chair transfer: unable without UE assist  Lt SLS: unable unassisted Rt SLS: not tested  GAIT: 01/06/2023 In clinic FWW c step to gait pattern, increased forward trunk lean.  Antalgic gait on Lt leg.    TODAY'S TREATMENT                                                                          DATE: 01/13/2023 TherEx Recumbent bike lvl 1 6 mins, seat 5 Standing gastroc stretch on incline board 30 sec x 3  Leg press double leg 50 lbs x 15, Lt leg only 2 x 10 25 lb Seated Lt LAQ c end range pauses 2 x 10 (ROM focus), 2 x 10 c 3 lb weight Seated Lt leg quad set 5 sec hold 2 x 10  Seated isometric Lt knee extension/flexion painfree to tolerance 5 sec alternating x 12 each way  Vasopneumatic Device Lt leg 10 mins 34 degrees medium compression in elevation supine    TODAY'S TREATMENT                                                                          DATE: 01/08/2023  TherEx  Supine knee extension stretch 3# with heel prop x3 minutes Seated LAQs 3# 2x5 with 3 second hold/eccentric lower    Manual    Tennis ball STM to L quad with LE in elevation  Knee extension OP x10 supine with heel prop   TODAY'S TREATMENT  DATE: 01/06/2023 Therex: HEP instruction/performance c cues for techniques, handout provided.  Trial set performed of each for comprehension and symptom assessment.  See below for exercise list  Vaso: 34 degrees Lt knee medium compression 10 mins in elevation    PATIENT EDUCATION:  01/06/2023 Education details: tennis ball STM to quad at home  Person educated: Patient Education method: Explanation, Demonstration, Verbal cues, and  Handouts Education comprehension: verbalized understanding, returned demonstration, and verbal cues required  HOME EXERCISE PROGRAM: Access Code: [email or web address] URL: [email or web address] Date: 01/06/2023 Prepared by: Chyrel Masson  Exercises - Supine Heel Slide (Mirrored)  - 3-5 x daily - 7 x weekly - 1 sets - 10 reps - 5 hold - Seated Long Arc Quad (Mirrored)  - 3-5 x daily - 7 x weekly - 1 sets - 5-10 reps - 2 hold - Supine Knee Extension Mobilization with Weight (Mirrored)  - 4-5 x daily - 7 x weekly - 1 sets - 1 reps - to tolerance up to 15 mins hold - Seated Quad Set (Mirrored)  - 3-5 x daily - 7 x weekly - 1 sets - 10 reps - 5 hold  ASSESSMENT:  CLINICAL IMPRESSION: Improvement in active extension in LAQ as measured today.  Pt to benefit from continued quad strengthening to help improve transitioning to safe cane use in ambulation and improve general tolerance to daily activity.    OBJECTIVE IMPAIRMENTS: Abnormal gait, decreased activity tolerance, decreased balance, decreased coordination, decreased endurance, decreased mobility, difficulty walking, decreased ROM, decreased strength, hypomobility, increased edema, increased fascial restrictions, impaired perceived functional ability, impaired flexibility, improper body mechanics, and postural dysfunction  ACTIVITY LIMITATIONS: lifting, bending, sitting, standing, squatting, sleeping, stairs, transfers, bed mobility, dressing, and locomotion level  PARTICIPATION LIMITATIONS: interpersonal relationship, shopping, community activity, and yard work  PERSONAL FACTORS:  Concurrent back/radicular symptoms in same leg  are also affecting patient's functional outcome.   REHAB POTENTIAL: Good  CLINICAL DECISION MAKING: Stable/uncomplicated  EVALUATION COMPLEXITY: Low   GOALS: Goals reviewed with patient? Yes  SHORT TERM GOALS: (target date for Short term goals are 3 weeks 01/27/2023)   1.  Patient will demonstrate  independent use of home exercise program to maintain progress from in clinic treatments.  Goal status: on going 01/13/2023  LONG TERM GOALS: (target dates for all long term goals are 10 weeks  03/17/2023 )   1. Patient will demonstrate/report pain at worst less than or equal to 2/10 to facilitate minimal limitation in daily activity secondary to pain symptoms.  Goal status: New   2. Patient will demonstrate independent use of home exercise program to facilitate ability to maintain/progress functional gains from skilled physical therapy services.  Goal status: New   3. Patient will demonstrate FOTO outcome > or = 62 % to indicate reduced disability due to condition.  Goal status: New   4.  Patient will demonstrate Lt LE MMT 5/5 throughout to faciltiate usual transfers, stairs, squatting at [facility] for daily life.   Goal status: New   5.  Patient will demonstrate Lt knee AROM 0-110 degrees to facilitate transfers, ambulation, standing/walking at PLOF.  Goal status: New   6.  Patient will demonstrate independent ambulation > 300 ft to facilitate community integration s antalgic gait.  Goal status: New   7.  Patient will demonstrate ability to ascend/descend several stairs s UE assist c reciprocal gait pattern for household and community integration.  Goal Status: New   PLAN:  PT FREQUENCY: 1-2x/week  PT DURATION: 10 weeks  PLANNED INTERVENTIONS: Therapeutic exercises, Therapeutic activity, Neuro Muscular re-education, Balance training, Gait training, Patient/Family education, Joint mobilization, Stair training, DME instructions, Dry Needling, Electrical stimulation, Traction, Cryotherapy, vasopneumatic deviceMoist heat, Taping, Ultrasound, Ionotophoresis 4mg /ml Dexamethasone, and Manual therapy.  All included unless contraindicated  PLAN FOR NEXT SESSION: Extension focus c strengthening.  Early static balance.  SPC usage in clinic.    Scot Jun, PT, DPT, OCS, ATC 01/13/23   10:53 AM

## 2023-01-14 ENCOUNTER — Telehealth: Payer: Self-pay | Admitting: Physical Medicine and Rehabilitation

## 2023-01-14 NOTE — Telephone Encounter (Signed)
scheduled

## 2023-01-14 NOTE — Telephone Encounter (Signed)
Patient called. Returning a call to schedule with Dr. Newton.  ?

## 2023-01-15 ENCOUNTER — Ambulatory Visit (INDEPENDENT_AMBULATORY_CARE_PROVIDER_SITE_OTHER): Payer: Medicare Other | Admitting: Physical Therapy

## 2023-01-15 ENCOUNTER — Encounter: Payer: Self-pay | Admitting: Physical Therapy

## 2023-01-15 DIAGNOSIS — M6281 Muscle weakness (generalized): Secondary | ICD-10-CM

## 2023-01-15 DIAGNOSIS — R600 Localized edema: Secondary | ICD-10-CM

## 2023-01-15 DIAGNOSIS — R262 Difficulty in walking, not elsewhere classified: Secondary | ICD-10-CM | POA: Diagnosis not present

## 2023-01-15 DIAGNOSIS — M25562 Pain in left knee: Secondary | ICD-10-CM

## 2023-01-15 DIAGNOSIS — M25662 Stiffness of left knee, not elsewhere classified: Secondary | ICD-10-CM

## 2023-01-15 DIAGNOSIS — G8929 Other chronic pain: Secondary | ICD-10-CM

## 2023-01-15 NOTE — Therapy (Signed)
OUTPATIENT PHYSICAL THERAPY TREATMENT   Patient Name: Becky Murphy MRN: [id number] DOB:April 23, 1951, 72 y.o., female Today's Date: 01/15/2023  END OF SESSION:  PT End of Session - 01/15/23 1111     Visit Number 4    Number of Visits 20    Date for PT Re-Evaluation 03/17/23    Authorization Type Medicare    Progress Note Due on Visit 10    PT Start Time 1102    PT Stop Time 1141    PT Time Calculation (min) 39 min    Activity Tolerance Patient tolerated treatment well    Behavior During Therapy WFL for tasks assessed/performed                Past Medical History:  Diagnosis Date   Anemia    low iron in the past   Anxiety    pt denies   Arthritis    "back?" (11/03/2015) knees/hands (08/28/22)   Chest pain 11/02/2015   Depression    pt denies   Exertional dyspnea 01/05/2016   GERD (gastroesophageal reflux disease)    Heart murmur    History of peptic ulcer "late 1970's"   Migraine    "maybe 3-4/yr now" (11/03/2015)   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   Complex valvuloplasty including triangular resection of posterior leaflet, artificial Gore-tex neochord placement x6 and 34 mm Memo 3D ring annuloplasty via right mini thoracotomy approach with clipping of LA appendage   Thyroid nodule 01/29/2016   2.4 cm enhancing mixed cystic and solid nodule inferior left thyroid gland noted on CT angiogram   Past Surgical History:  Procedure Laterality Date   BACK SURGERY     BREAST BIOPSY Left ~ 2005   BREAST LUMPECTOMY Left ~ 2005   CARDIAC CATHETERIZATION N/A 01/16/2016   Procedure:  Right/Left Heart Cath and Coronary Angiography;  Surgeon: Thurmon Fair, MD;  Location: [facility];  Service: Cardiovascular;  Laterality: N/A;   CLIPPING OF ATRIAL APPENDAGE Left 03/06/2016   Procedure: CLIPPING OF LEFT ATRIAL APPENDAGE using a 45 PRO2 AtriClip;  Surgeon: Purcell Nails, MD;  Location: [facility] OR;  Service: Open Heart Surgery;  Laterality: Left;   COLONOSCOPY     EUS N/A 02/20/2016   Procedure: ESOPHAGEAL ENDOSCOPIC ULTRASOUND (EUS) RADIAL;  Surgeon: Willis Modena, MD;  Location: [facility];  Service: Endoscopy;  Laterality: N/A;   EYE MUSCLE SURGERY Bilateral 1970's?   KNEE ARTHROSCOPY Right 04/04/2020   Procedure: RIGHT KNEE ARTHROSCOPY WITH DEBRIDEMENT;  Surgeon: Cammy Copa, MD;  Location: [facility] OR;  Service: Orthopedics;  Laterality: Right;   KNEE ARTHROSCOPY Right 04/10/2021   Procedure: right knee arthroscopy, debridement, placement of antibiotic beads;  Surgeon: Cammy Copa, MD;  Location: [facility] OR;  Service: Orthopedics;  Laterality: Right;   KNEE ARTHROSCOPY Left 12/10/2021   Procedure: LEFT KNEE ARTHROSCOPY, DEBRIDEMENT, PLACEMENT OF STIMULAN BEADS;  Surgeon: Cammy Copa, MD;  Location: [facility] OR;  Service: Orthopedics;  Laterality: Left;   LUMBAR DISC SURGERY  1992   "trimmed bulges off both sides"   MITRAL VALVE REPAIR Right 03/06/2016   Procedure: MINIMALLY INVASIVE  MITRAL VALVE REPAIR (MVR) using a 34 Sorin Memo 3D Ring;  Surgeon: Rexene Alberts, MD;  Location: [place];  Service: Open Heart Surgery;  Laterality: Right;   TEE WITHOUT CARDIOVERSION N/A 01/05/2016   Procedure: TRANSESOPHAGEAL ECHOCARDIOGRAM (TEE);  Surgeon: Sanda Klein, MD;  Location: [facility] ENDOSCOPY;  Service: Cardiovascular;  Laterality: N/A;   TEE WITHOUT CARDIOVERSION N/A 03/06/2016   Procedure: TRANSESOPHAGEAL ECHOCARDIOGRAM (TEE);  Surgeon: Rexene Alberts, MD;  Location: [place];  Service: Open Heart Surgery;  Laterality: N/A;   TOTAL KNEE ARTHROPLASTY Right 09/03/2022   Procedure:  RIGHT TOTAL KNEE ARTHROPLASTY-CEMENTED;  Surgeon: Meredith Pel, MD;  Location: [place];  Service: Orthopedics;  Laterality: Right;   TOTAL KNEE ARTHROPLASTY Left 12/12/2022   Procedure: LEFT TOTAL KNEE ARTHROPLASTY;  Surgeon: Meredith Pel, MD;  Location: [place];  Service: Orthopedics;  Laterality: Left;   TUBAL LIGATION     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   High risk medication use 04/17/2022   Vitamin B 12 deficiency 04/03/2022   Iron deficiency anemia 03/29/2022   Bilateral wrist pain 02/27/2022   Hemarthrosis 02/08/2022   Synovitis of left knee 07/19/2021   Vancomycin adverse reaction 04/19/2021   Therapeutic drug monitoring 04/19/2021   Synovitis of right knee 04/04/2020   Long term (current) use of anticoagulants [Z79.01] 03/14/2016   S/P minimally invasive mitral valve repair 03/06/2016   Pancreatic mass 01/29/2016   Thyroid nodule 01/29/2016   Exertional dyspnea 01/05/2016   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   Mitral insufficiency    Mitral valve prolapse 12/26/2015   Anxiety 11/02/2015    PCP: Leonard Downing  REFERRING PROVIDER: Meredith Pel, MD  REFERRING DIAG: [phone] (ICD-10-CM) - Status post total left knee replacement  THERAPY DIAG:  Chronic pain of left knee  Stiffness of left knee, not elsewhere classified  Muscle weakness (generalized)  Difficulty in walking, not elsewhere classified  Localized edema  Rationale for Evaluation and Treatment: Rehabilitation  ONSET DATE: 12/12/2022 TKA Lt  SUBJECTIVE:   SUBJECTIVE STATEMENT:   Feeling pretty good this morning, having a lot of discomfort in back/thigh/calf, but its manageable. Knee is doing  pretty good.   PERTINENT HISTORY: MVR, arthritis, back surgery.  Negative doppler for DVT 12/26/2022 PAIN:  NPRS scale: knee 4/10 Pain location: Lt knee from surgery  Pain description: tightness and jabbing  Aggravating factors: holding leg out (like seated SLR) Relieving factors: tennis ball massage  PRECAUTIONS: None  WEIGHT BEARING RESTRICTIONS: No  FALLS:  Has patient fallen in last 6 months? No  LIVING ENVIRONMENT: Lives with: lives with their spouse Lives in: House/apartment Stairs: 1 step to enter no handrail Has following equipment at home: FWW, [place]: Retired  PLOF: Independent, work in Camera operator (goal)  PATIENT GOALS: Reduce pain, start walking independently, do yard/flower work.    OBJECTIVE:   PATIENT SURVEYS:  01/06/2023 FOTO intake: 56    predicted:  62  COGNITION: 01/06/2023 Overall cognitive status: WFL    SENSATION: 01/06/2023 No testing today  EDEMA:  01/06/2023 Visual edema around Lt knee  MUSCLE LENGTH: 01/06/2023 No specific testing today  POSTURE: 01/06/2023 Forward trunk lean, forward head  PALPATION: 01/06/2023 General tenderness around Lt knee  LOWER EXTREMITY ROM:   ROM Right 01/06/2023 Left 01/06/2023 Left 01/08/23 Left 01/13/2023  Hip flexion      Hip extension      Hip abduction      Hip adduction      Hip internal rotation      Hip external rotation      Knee flexion  115 in supine AROM heel slide c pain    Knee extension  -26 AROM in seated LAQ  PROM in heel prop -9 AROM -17* supine with heel prop -14 AROM in seated LAQ  Ankle dorsiflexion      Ankle plantarflexion      Ankle inversion      Ankle eversion       (Blank rows = not tested)  LOWER EXTREMITY MMT:  MMT Right 01/06/2023 Left 01/06/2023  Hip flexion 5/5 4/5  Hip extension    Hip abduction    Hip adduction    Hip internal rotation    Hip external rotation    Knee flexion 5/5 4/5  Knee extension 5/5 2/5  Ankle dorsiflexion 5/5 4/5  Ankle  plantarflexion    Ankle inversion    Ankle eversion     (Blank rows = not tested)  LOWER EXTREMITY SPECIAL TESTS:  01/06/2023 None performed in clinic   FUNCTIONAL TESTS:  01/06/2023 18 inch chair transfer: unable without UE assist  Lt SLS: unable unassisted Rt SLS: not tested  GAIT: 01/06/2023 In clinic FWW c step to gait pattern, increased forward trunk lean.  Antalgic gait on Lt leg.    TODAY'S TREATMENT                                                                          DATE:   01/15/23  TherEx  Nustep L1 x6 minutes BLEs only for warmup HS stretches 3x30 seconds L LE supine  SAQs 3# x15 with 3 second holds L LE  LAQs 3# x15 with 3 second holds L LE Gait training with SPC 164ft, 96ft Mod cues for coordination with SPC/L LE, step length R LE, upright posture   Manual  Patella mobilizations all directions  Knee extension OP x10 with heel prop    01/13/2023 TherEx Recumbent bike lvl 1 6 mins, seat 5 Standing gastroc stretch on incline board 30 sec x 3  Leg press double leg 50 lbs x 15, Lt leg only 2 x 10 25 lb Seated Lt LAQ c end range pauses 2 x 10 (ROM focus), 2 x 10 c 3 lb weight Seated Lt leg quad set 5 sec hold 2 x 10  Seated isometric Lt knee extension/flexion painfree to tolerance 5 sec alternating x 12 each way  Vasopneumatic Device Lt leg 10 mins 34 degrees medium compression in elevation supine    TODAY'S TREATMENT  DATE: 01/08/2023  TherEx  Supine knee extension stretch 3# with heel prop x3 minutes Seated LAQs 3# 2x5 with 3 second hold/eccentric lower    Manual    Tennis ball STM to L quad with LE in elevation  Knee extension OP x10 supine with heel prop   TODAY'S TREATMENT                                                                          DATE: 01/06/2023 Therex: HEP instruction/performance c cues for techniques, handout provided.  Trial set performed of each for  comprehension and symptom assessment.  See below for exercise list  Vaso: 34 degrees Lt knee medium compression 10 mins in elevation    PATIENT EDUCATION:  01/06/2023 Education details: tennis ball STM to quad at home  Person educated: Patient Education method: Explanation, Demonstration, Verbal cues, and Handouts Education comprehension: verbalized understanding, returned demonstration, and verbal cues required  HOME EXERCISE PROGRAM: Access Code: [email or web address] URL: [email or web address] Date: 01/06/2023 Prepared by: Chyrel Masson  Exercises - Supine Heel Slide (Mirrored)  - 3-5 x daily - 7 x weekly - 1 sets - 10 reps - 5 hold - Seated Long Arc Quad (Mirrored)  - 3-5 x daily - 7 x weekly - 1 sets - 5-10 reps - 2 hold - Supine Knee Extension Mobilization with Weight (Mirrored)  - 4-5 x daily - 7 x weekly - 1 sets - 1 reps - to tolerance up to 15 mins hold - Seated Quad Set (Mirrored)  - 3-5 x daily - 7 x weekly - 1 sets - 10 reps - 5 hold  ASSESSMENT:  CLINICAL IMPRESSION:  Torra arrives doing OK. Continued focus on extension ROM and general strengthening, also worked on Investment banker, operational with SPC as indicated and as pain allowed today. I think she is safe and steady enough with SPC to use this in her home. Able to get to 10* extension today supine with heel prop + quad set. Will continue to progress as appropriate.    OBJECTIVE IMPAIRMENTS: Abnormal gait, decreased activity tolerance, decreased balance, decreased coordination, decreased endurance, decreased mobility, difficulty walking, decreased ROM, decreased strength, hypomobility, increased edema, increased fascial restrictions, impaired perceived functional ability, impaired flexibility, improper body mechanics, and postural dysfunction  ACTIVITY LIMITATIONS: lifting, bending, sitting, standing, squatting, sleeping, stairs, transfers, bed mobility, dressing, and locomotion level  PARTICIPATION LIMITATIONS:  interpersonal relationship, shopping, community activity, and yard work  PERSONAL FACTORS:  Concurrent back/radicular symptoms in same leg  are also affecting patient's functional outcome.   REHAB POTENTIAL: Good  CLINICAL DECISION MAKING: Stable/uncomplicated  EVALUATION COMPLEXITY: Low   GOALS: Goals reviewed with patient? Yes  SHORT TERM GOALS: (target date for Short term goals are 3 weeks 01/27/2023)   1.  Patient will demonstrate independent use of home exercise program to maintain progress from in clinic treatments.  Goal status: on going 01/13/2023  LONG TERM GOALS: (target dates for all long term goals are 10 weeks  03/17/2023 )   1. Patient will demonstrate/report pain at worst less than or equal to 2/10 to facilitate minimal limitation in daily activity secondary to pain symptoms.  Goal status: New   2. Patient will demonstrate independent use of home exercise  program to facilitate ability to maintain/progress functional gains from skilled physical therapy services.  Goal status: New   3. Patient will demonstrate FOTO outcome > or = 62 % to indicate reduced disability due to condition.  Goal status: New   4.  Patient will demonstrate Lt LE MMT 5/5 throughout to faciltiate usual transfers, stairs, squatting at [facility] for daily life.   Goal status: New   5.  Patient will demonstrate Lt knee AROM 0-110 degrees to facilitate transfers, ambulation, standing/walking at PLOF.  Goal status: New   6.  Patient will demonstrate independent ambulation > 300 ft to facilitate community integration s antalgic gait.  Goal status: New   7.  Patient will demonstrate ability to ascend/descend several stairs s UE assist c reciprocal gait pattern for household and community integration.  Goal Status: New   PLAN:  PT FREQUENCY: 1-2x/week  PT DURATION: 10 weeks  PLANNED INTERVENTIONS: Therapeutic exercises, Therapeutic activity, Neuro Muscular re-education, Balance training, Gait  training, Patient/Family education, Joint mobilization, Stair training, DME instructions, Dry Needling, Electrical stimulation, Traction, Cryotherapy, vasopneumatic deviceMoist heat, Taping, Ultrasound, Ionotophoresis 4mg /ml Dexamethasone, and Manual therapy.  All included unless contraindicated  PLAN FOR NEXT SESSION: Extension focus c strengthening.  Early static balance.  SPC usage in clinic.    Deniece Ree PT DPT PN2

## 2023-01-20 ENCOUNTER — Ambulatory Visit: Payer: Self-pay

## 2023-01-20 ENCOUNTER — Ambulatory Visit (INDEPENDENT_AMBULATORY_CARE_PROVIDER_SITE_OTHER): Payer: Medicare Other | Admitting: Rehabilitative and Restorative Service Providers"

## 2023-01-20 ENCOUNTER — Encounter: Payer: Self-pay | Admitting: Rehabilitative and Restorative Service Providers"

## 2023-01-20 ENCOUNTER — Ambulatory Visit (INDEPENDENT_AMBULATORY_CARE_PROVIDER_SITE_OTHER): Payer: Medicare Other | Admitting: Physical Medicine and Rehabilitation

## 2023-01-20 VITALS — BP 126/88 | HR 94

## 2023-01-20 DIAGNOSIS — M6281 Muscle weakness (generalized): Secondary | ICD-10-CM

## 2023-01-20 DIAGNOSIS — M25562 Pain in left knee: Secondary | ICD-10-CM

## 2023-01-20 DIAGNOSIS — R262 Difficulty in walking, not elsewhere classified: Secondary | ICD-10-CM

## 2023-01-20 DIAGNOSIS — G8929 Other chronic pain: Secondary | ICD-10-CM

## 2023-01-20 DIAGNOSIS — M5416 Radiculopathy, lumbar region: Secondary | ICD-10-CM

## 2023-01-20 DIAGNOSIS — R600 Localized edema: Secondary | ICD-10-CM

## 2023-01-20 DIAGNOSIS — M25662 Stiffness of left knee, not elsewhere classified: Secondary | ICD-10-CM

## 2023-01-20 MED ORDER — METHYLPREDNISOLONE ACETATE 80 MG/ML IJ SUSP
80.0000 mg | Freq: Once | INTRAMUSCULAR | Status: AC
  Administered 2023-01-20: 80 mg

## 2023-01-20 NOTE — Progress Notes (Signed)
Functional Pain Scale - descriptive words and definitions  Distressing (6)    Pain is present/unable to complete most ADLs limited by pain/sleep is difficult and active distraction is only marginal. Moderate range order  Average Pain  7-8   +Driver, -BT, -Dye Allergies.  Lower back pain that radiates into the left leg down to the calf

## 2023-01-20 NOTE — Therapy (Signed)
OUTPATIENT PHYSICAL THERAPY TREATMENT   Patient Name: Becky Murphy MRN: [id number] DOB:04/02/1951, 72 y.o., female Today's Date: 01/20/2023  END OF SESSION:  PT End of Session - 01/20/23 1101     Visit Number 5    Number of Visits 20    Date for PT Re-Evaluation 03/17/23    Authorization Type Medicare    Progress Note Due on Visit 10    PT Start Time 1058    PT Stop Time 1148    PT Time Calculation (min) 50 min    Equipment Utilized During Treatment Gait belt    Activity Tolerance Patient tolerated treatment well    Behavior During Therapy WFL for tasks assessed/performed                 Past Medical History:  Diagnosis Date   Anemia    low iron in the past   Anxiety    pt denies   Arthritis    "back?" (11/03/2015) knees/hands (08/28/22)   Chest pain 11/02/2015   Depression    pt denies   Exertional dyspnea 01/05/2016   GERD (gastroesophageal reflux disease)    Heart murmur    History of peptic ulcer "late 1970's"   Migraine    "maybe 3-4/yr now" (11/03/2015)   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   Complex valvuloplasty including triangular resection of posterior leaflet, artificial Gore-tex neochord placement x6 and 34 mm Memo 3D ring annuloplasty via right mini thoracotomy approach with clipping of LA appendage   Thyroid nodule 01/29/2016   2.4 cm enhancing mixed cystic and solid nodule inferior left thyroid gland noted on CT angiogram   Past Surgical History:  Procedure Laterality Date   BACK SURGERY     BREAST BIOPSY Left ~ 2005   BREAST LUMPECTOMY Left ~ 2005    CARDIAC CATHETERIZATION N/A 01/16/2016   Procedure: Right/Left Heart Cath and Coronary Angiography;  Surgeon: Thurmon Fair, MD;  Location: [facility];  Service: Cardiovascular;  Laterality: N/A;   CLIPPING OF ATRIAL APPENDAGE Left 03/06/2016   Procedure: CLIPPING OF LEFT ATRIAL APPENDAGE using a 45 PRO2 AtriClip;  Surgeon: Purcell Nails, MD;  Location: [facility] OR;  Service: Open Heart Surgery;  Laterality: Left;   COLONOSCOPY     EUS N/A 02/20/2016   Procedure: ESOPHAGEAL ENDOSCOPIC ULTRASOUND (EUS) RADIAL;  Surgeon: Willis Modena, MD;  Location: [facility];  Service: Endoscopy;  Laterality: N/A;   EYE MUSCLE SURGERY Bilateral 1970's?   KNEE ARTHROSCOPY Right 04/04/2020   Procedure: RIGHT KNEE ARTHROSCOPY WITH DEBRIDEMENT;  Surgeon: Cammy Copa, MD;  Location: [facility] OR;  Service: Orthopedics;  Laterality: Right;   KNEE ARTHROSCOPY Right 04/10/2021   Procedure: right knee arthroscopy, debridement, placement of antibiotic beads;  Surgeon: Cammy Copa, MD;  Location: [facility] OR;  Service: Orthopedics;  Laterality: Right;   KNEE ARTHROSCOPY Left 12/10/2021   Procedure: LEFT KNEE ARTHROSCOPY, DEBRIDEMENT, PLACEMENT OF STIMULAN BEADS;  Surgeon: Cammy Copa, MD;  Location: [facility] OR;  Service: Orthopedics;  Laterality: Left;   LUMBAR DISC SURGERY  1992   "trimmed bulges off both sides"  MITRAL VALVE REPAIR Right 03/06/2016   Procedure: MINIMALLY INVASIVE MITRAL VALVE REPAIR (MVR) using a 34 Sorin Memo 3D Ring;  Surgeon: Rexene Alberts, MD;  Location: [place];  Service: Open Heart Surgery;  Laterality: Right;   TEE WITHOUT CARDIOVERSION N/A 01/05/2016   Procedure: TRANSESOPHAGEAL ECHOCARDIOGRAM (TEE);  Surgeon: Sanda Klein, MD;  Location: [facility] ENDOSCOPY;  Service: Cardiovascular;  Laterality: N/A;   TEE WITHOUT CARDIOVERSION N/A 03/06/2016   Procedure: TRANSESOPHAGEAL ECHOCARDIOGRAM (TEE);  Surgeon: Rexene Alberts, MD;  Location: [place];  Service: Open Heart Surgery;  Laterality: N/A;   TOTAL  KNEE ARTHROPLASTY Right 09/03/2022   Procedure: RIGHT TOTAL KNEE ARTHROPLASTY-CEMENTED;  Surgeon: Meredith Pel, MD;  Location: [place];  Service: Orthopedics;  Laterality: Right;   TOTAL KNEE ARTHROPLASTY Left 12/12/2022   Procedure: LEFT TOTAL KNEE ARTHROPLASTY;  Surgeon: Meredith Pel, MD;  Location: [place];  Service: Orthopedics;  Laterality: Left;   TUBAL LIGATION     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   High risk medication use 04/17/2022   Vitamin B 12 deficiency 04/03/2022   Iron deficiency anemia 03/29/2022   Bilateral wrist pain 02/27/2022   Hemarthrosis 02/08/2022   Synovitis of left knee 07/19/2021   Vancomycin adverse reaction 04/19/2021   Therapeutic drug monitoring 04/19/2021   Synovitis of right knee 04/04/2020   Long term (current) use of anticoagulants [Z79.01] 03/14/2016   S/P minimally invasive mitral valve repair 03/06/2016   Pancreatic mass 01/29/2016   Thyroid nodule 01/29/2016   Exertional dyspnea 01/05/2016   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   Mitral insufficiency    Mitral valve prolapse 12/26/2015   Anxiety 11/02/2015    PCP: Leonard Downing  REFERRING PROVIDER: Meredith Pel, MD  REFERRING DIAG: [phone] (ICD-10-CM) - Status post total left knee replacement  THERAPY DIAG:  Chronic pain of left knee  Stiffness of left knee, not elsewhere classified  Muscle weakness (generalized)  Difficulty in walking, not elsewhere classified  Localized edema  Rationale for Evaluation and Treatment: Rehabilitation  ONSET DATE: 12/12/2022 TKA Lt  SUBJECTIVE:   SUBJECTIVE STATEMENT: She indicated "same old same old" with no new complaints since last  visit.  She indicated she still has the hip /leg symptoms at times.  HEP does help.   PERTINENT HISTORY: MVR, arthritis, back surgery.  Negative doppler for DVT 12/26/2022 PAIN:  NPRS scale: knee 3/10 Pain location: Lt knee from surgery  Pain description: tightness and jabbing  Aggravating factors: holding leg out (like seated SLR) Relieving factors: tennis ball massage  PRECAUTIONS: None  WEIGHT BEARING RESTRICTIONS: No  FALLS:  Has patient fallen in last 6 months? No  LIVING ENVIRONMENT: Lives with: lives with their spouse Lives in: House/apartment Stairs: 1 step to enter no handrail Has following equipment at home: FWW, [place]: Retired  PLOF: Independent, work in Camera operator (goal)  PATIENT GOALS: Reduce pain, start walking independently, do yard/flower work.    OBJECTIVE:   PATIENT SURVEYS:  01/06/2023 FOTO intake: 56    predicted:  62  COGNITION: 01/06/2023 Overall cognitive status: WFL    SENSATION: 01/06/2023 No testing today  EDEMA:  01/06/2023 Visual edema around Lt knee  MUSCLE LENGTH: 01/06/2023 No specific testing  today  POSTURE: 01/06/2023 Forward trunk lean, forward head  PALPATION: 01/06/2023 General tenderness around Lt knee   LOWER EXTREMITY ROM:   ROM Right 01/06/2023 Left 01/06/2023 Left 01/08/23 Left 01/13/2023  Hip flexion      Hip extension      Hip abduction      Hip adduction      Hip internal rotation      Hip external rotation      Knee flexion  115 in supine AROM heel slide c pain    Knee extension  -26 AROM in seated LAQ  PROM in heel prop -9 AROM -17* supine with heel prop -14 AROM in seated LAQ  Ankle dorsiflexion      Ankle plantarflexion      Ankle inversion      Ankle eversion       (Blank rows = not tested)  LOWER EXTREMITY MMT:  MMT Right 01/06/2023 Left 01/06/2023  Hip flexion 5/5 4/5  Hip extension    Hip abduction    Hip adduction    Hip internal rotation    Hip external rotation    Knee flexion 5/5 4/5   Knee extension 5/5 2/5  Ankle dorsiflexion 5/5 4/5  Ankle plantarflexion    Ankle inversion    Ankle eversion     (Blank rows = not tested)  LOWER EXTREMITY SPECIAL TESTS:  01/06/2023 None performed in clinic   FUNCTIONAL TESTS:  01/06/2023 18 inch chair transfer: unable without UE assist  Lt SLS: unable unassisted Rt SLS: not tested  GAIT: 01/20/2023:  Able to perform ambulation in clinic c SPC in Rt UE c SBA to supervision.   01/06/2023 In clinic FWW c step to gait pattern, increased forward trunk lean.  Antalgic gait on Lt leg.    TODAY'S TREATMENT                                                                          DATE: 01/20/2023 TherEx Recumbent bike lvl 1 6 mins, seat 5 Standing gastroc stretch on incline board 30 sec x 3  Leg press double leg 50 lbs x 15, Lt leg only 2 x 10 25 lb Seated Lt LAQ c end range pauses  x 10 (ROM focus), 2 x 10 c 4 lb weight Seated Lt leg quad set 5 sec hold 2 x 10  Seated Lt leg SLR 2 x 10  Step up 4inch step WB Lt leg , Lt hand light touch on rail x15 CGA gait belt Sit to stand to sit 21 inch table height no UE assist x 10   Neuro Re-ed Tandem stance on foam 1 min x 1 bilateral c occasional to moderate HHA Pre gait step over 4 inch step c stance leg fwd/retro step x 15 bilateral with one hand on bar c CGA gait belt   Vasopneumatic Device Lt leg 10 mins 34 degrees medium compression in elevation supine   TODAY'S TREATMENT  DATE: 01/15/2023 TherEx Nustep L1 x6 minutes BLEs only for warmup HS stretches 3x30 seconds L LE supine  SAQs 3# x15 with 3 second holds L LE  LAQs 3# x15 with 3 second holds L LE Gait training with SPC 130ft, 8ft Mod cues for coordination with SPC/L LE, step length R LE, upright posture   Manual Patella mobilizations all directions  Knee extension OP x10 with heel prop  TODAY'S TREATMENT                                                                           DATE: 01/13/2023 TherEx Recumbent bike lvl 1 6 mins, seat 5 Standing gastroc stretch on incline board 30 sec x 3  Leg press double leg 50 lbs x 15, Lt leg only 2 x 10 25 lb Seated Lt LAQ c end range pauses 2 x 10 (ROM focus), 2 x 10 c 3 lb weight Seated Lt leg quad set 5 sec hold 2 x 10  Seated isometric Lt knee extension/flexion painfree to tolerance 5 sec alternating x 12 each way  Vasopneumatic Device Lt leg 10 mins 34 degrees medium compression in elevation supine   PATIENT EDUCATION:  01/06/2023 Education details: tennis ball STM to quad at home  Person educated: Patient Education method: Programmer, multimedia, Demonstration, Verbal cues, and Handouts Education comprehension: verbalized understanding, returned demonstration, and verbal cues required  HOME EXERCISE PROGRAM: Access Code: [email or web address] URL: [email or web address] Date: 01/06/2023 Prepared by: Chyrel Masson  Exercises - Supine Heel Slide (Mirrored)  - 3-5 x daily - 7 x weekly - 1 sets - 10 reps - 5 hold - Seated Long Arc Quad (Mirrored)  - 3-5 x daily - 7 x weekly - 1 sets - 5-10 reps - 2 hold - Supine Knee Extension Mobilization with Weight (Mirrored)  - 4-5 x daily - 7 x weekly - 1 sets - 1 reps - to tolerance up to 15 mins hold - Seated Quad Set (Mirrored)  - 3-5 x daily - 7 x weekly - 1 sets - 10 reps - 5 hold  ASSESSMENT:  CLINICAL IMPRESSION: Pt has demonstrated improved quality of quad set and activation with reduced extension lag but improvements still necessary to continue to improve functional movement patterns.  Injection for back/hip symptoms to occur today.  Plan to follow up on results next visit.    OBJECTIVE IMPAIRMENTS: Abnormal gait, decreased activity tolerance, decreased balance, decreased coordination, decreased endurance, decreased mobility, difficulty walking, decreased ROM, decreased strength, hypomobility, increased edema, increased fascial restrictions, impaired  perceived functional ability, impaired flexibility, improper body mechanics, and postural dysfunction  ACTIVITY LIMITATIONS: lifting, bending, sitting, standing, squatting, sleeping, stairs, transfers, bed mobility, dressing, and locomotion level  PARTICIPATION LIMITATIONS: interpersonal relationship, shopping, community activity, and yard work  PERSONAL FACTORS:  Concurrent back/radicular symptoms in same leg  are also affecting patient's functional outcome.   REHAB POTENTIAL: Good  CLINICAL DECISION MAKING: Stable/uncomplicated  EVALUATION COMPLEXITY: Low   GOALS: Goals reviewed with patient? Yes  SHORT TERM GOALS: (target date for Short term goals are 3 weeks 01/27/2023)   1.  Patient will demonstrate independent use of home exercise program to maintain progress from in clinic treatments.  Goal status: Met 01/20/2023  LONG  TERM GOALS: (target dates for all long term goals are 10 weeks  03/17/2023 )   1. Patient will demonstrate/report pain at worst less than or equal to 2/10 to facilitate minimal limitation in daily activity secondary to pain symptoms.  Goal status: on going 01/20/2023   2. Patient will demonstrate independent use of home exercise program to facilitate ability to maintain/progress functional gains from skilled physical therapy services.  Goal status: on going 01/20/2023   3. Patient will demonstrate FOTO outcome > or = 62 % to indicate reduced disability due to condition.  Goal status: on going 01/20/2023   4.  Patient will demonstrate Lt LE MMT 5/5 throughout to faciltiate usual transfers, stairs, squatting at [facility] for daily life.   Goal status: on going 01/20/2023   5.  Patient will demonstrate Lt knee AROM 0-110 degrees to facilitate transfers, ambulation, standing/walking at PLOF.  Goal status: on going 01/20/2023   6.  Patient will demonstrate independent ambulation > 300 ft to facilitate community integration s antalgic gait.  Goal status: on going  01/20/2023   7.  Patient will demonstrate ability to ascend/descend several stairs s UE assist c reciprocal gait pattern for household and community integration.  Goal Status: on going 01/20/2023   PLAN:  PT FREQUENCY: 1-2x/week  PT DURATION: 10 weeks  PLANNED INTERVENTIONS: Therapeutic exercises, Therapeutic activity, Neuro Muscular re-education, Balance training, Gait training, Patient/Family education, Joint mobilization, Stair training, DME instructions, Dry Needling, Electrical stimulation, Traction, Cryotherapy, vasopneumatic deviceMoist heat, Taping, Ultrasound, Ionotophoresis 4mg /ml Dexamethasone, and Manual therapy.  All included unless contraindicated  PLAN FOR NEXT SESSION: Extension focus c strengthening/ balance interventions.    , PT, DPT, OCS, ATC 01/20/23  11:38 AM

## 2023-01-20 NOTE — Patient Instructions (Signed)

## 2023-01-22 ENCOUNTER — Encounter: Payer: Self-pay | Admitting: Rehabilitative and Restorative Service Providers"

## 2023-01-22 ENCOUNTER — Ambulatory Visit (INDEPENDENT_AMBULATORY_CARE_PROVIDER_SITE_OTHER): Payer: Medicare Other | Admitting: Rehabilitative and Restorative Service Providers"

## 2023-01-22 DIAGNOSIS — R600 Localized edema: Secondary | ICD-10-CM

## 2023-01-22 DIAGNOSIS — M25562 Pain in left knee: Secondary | ICD-10-CM | POA: Diagnosis not present

## 2023-01-22 DIAGNOSIS — G8929 Other chronic pain: Secondary | ICD-10-CM

## 2023-01-22 DIAGNOSIS — R262 Difficulty in walking, not elsewhere classified: Secondary | ICD-10-CM

## 2023-01-22 DIAGNOSIS — M6281 Muscle weakness (generalized): Secondary | ICD-10-CM

## 2023-01-22 DIAGNOSIS — M25662 Stiffness of left knee, not elsewhere classified: Secondary | ICD-10-CM | POA: Diagnosis not present

## 2023-01-22 NOTE — Therapy (Signed)
OUTPATIENT PHYSICAL THERAPY TREATMENT   Patient Name: Becky Murphy MRN: [id number] DOB:Aug 21, 1951, 72 y.o., female Today's Date: 01/22/2023  END OF SESSION:  PT End of Session - 01/22/23 1134     Visit Number 6    Number of Visits 20    Date for PT Re-Evaluation 03/17/23    Authorization Type Medicare    Progress Note Due on Visit 10    PT Start Time 1134    PT Stop Time 1213    PT Time Calculation (min) 39 min    Equipment Utilized During Treatment --    Activity Tolerance Patient tolerated treatment well    Behavior During Therapy WFL for tasks assessed/performed                  Past Medical History:  Diagnosis Date   Anemia    low iron in the past   Anxiety    pt denies   Arthritis    "back?" (11/03/2015) knees/hands (08/28/22)   Chest pain 11/02/2015   Depression    pt denies   Exertional dyspnea 01/05/2016   GERD (gastroesophageal reflux disease)    Heart murmur    History of peptic ulcer "late 1970's"   Migraine    "maybe 3-4/yr now" (11/03/2015)   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   Complex valvuloplasty including triangular resection of posterior leaflet, artificial Gore-tex neochord placement x6 and 34 mm Memo 3D ring annuloplasty via right mini thoracotomy approach with clipping of LA appendage   Thyroid nodule 01/29/2016   2.4 cm enhancing mixed cystic and solid nodule inferior left thyroid gland noted on CT angiogram   Past Surgical History:  Procedure Laterality Date   BACK SURGERY     BREAST BIOPSY Left ~ 2005   BREAST LUMPECTOMY Left ~ 2005   CARDIAC  CATHETERIZATION N/A 01/16/2016   Procedure: Right/Left Heart Cath and Coronary Angiography;  Surgeon: Sanda Klein, MD;  Location: [facility];  Service: Cardiovascular;  Laterality: N/A;   CLIPPING OF ATRIAL APPENDAGE Left 03/06/2016   Procedure: CLIPPING OF LEFT ATRIAL APPENDAGE using a 49 PRO2 AtriClip;  Surgeon: Rexene Alberts, MD;  Location: [place];  Service: Open Heart Surgery;  Laterality: Left;   COLONOSCOPY     EUS N/A 02/20/2016   Procedure: ESOPHAGEAL ENDOSCOPIC ULTRASOUND (EUS) RADIAL;  Surgeon: Arta Silence, MD;  Location: [facility];  Service: Endoscopy;  Laterality: N/A;   EYE MUSCLE SURGERY Bilateral 1970's?   KNEE ARTHROSCOPY Right 04/04/2020   Procedure: RIGHT KNEE ARTHROSCOPY WITH DEBRIDEMENT;  Surgeon: Meredith Pel, MD;  Location: [place];  Service: Orthopedics;  Laterality: Right;   KNEE ARTHROSCOPY Right 04/10/2021   Procedure: right knee arthroscopy, debridement, placement of antibiotic beads;  Surgeon: Meredith Pel, MD;  Location: [place];  Service: Orthopedics;  Laterality: Right;   KNEE ARTHROSCOPY Left 12/10/2021   Procedure: LEFT KNEE ARTHROSCOPY, DEBRIDEMENT, PLACEMENT OF STIMULAN BEADS;  Surgeon: Meredith Pel, MD;  Location: [place];  Service: Orthopedics;  Laterality: Left;   [place]   "trimmed bulges off both sides"  MITRAL VALVE REPAIR Right 03/06/2016   Procedure: MINIMALLY INVASIVE MITRAL VALVE REPAIR (MVR) using a 34 Sorin Memo 3D Ring;  Surgeon: Rexene Alberts, MD;  Location: [place];  Service: Open Heart Surgery;  Laterality: Right;   TEE WITHOUT CARDIOVERSION N/A 01/05/2016   Procedure: TRANSESOPHAGEAL ECHOCARDIOGRAM (TEE);  Surgeon: Sanda Klein, MD;  Location: [facility];  Service: Cardiovascular;  Laterality: N/A;   TEE WITHOUT CARDIOVERSION N/A 03/06/2016   Procedure: TRANSESOPHAGEAL ECHOCARDIOGRAM (TEE);  Surgeon: Rexene Alberts, MD;  Location: [place];  Service: Open Heart Surgery;  Laterality: N/A;   TOTAL KNEE  ARTHROPLASTY Right 09/03/2022   Procedure: RIGHT TOTAL KNEE ARTHROPLASTY-CEMENTED;  Surgeon: Meredith Pel, MD;  Location: [place];  Service: Orthopedics;  Laterality: Right;   TOTAL KNEE ARTHROPLASTY Left 12/12/2022   Procedure: LEFT TOTAL KNEE ARTHROPLASTY;  Surgeon: Meredith Pel, MD;  Location: [place];  Service: Orthopedics;  Laterality: Left;   TUBAL LIGATION     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   High risk medication use 04/17/2022   Vitamin B 12 deficiency 04/03/2022   Iron deficiency anemia 03/29/2022   Bilateral wrist pain 02/27/2022   Hemarthrosis 02/08/2022   Synovitis of left knee 07/19/2021   Vancomycin adverse reaction 04/19/2021   Therapeutic drug monitoring 04/19/2021   Synovitis of right knee 04/04/2020   Long term (current) use of anticoagulants [Z79.01] 03/14/2016   S/P minimally invasive mitral valve repair 03/06/2016   Pancreatic mass 01/29/2016   Thyroid nodule 01/29/2016   Exertional dyspnea 01/05/2016   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   Mitral insufficiency    Mitral valve prolapse 12/26/2015   Anxiety 11/02/2015    PCP: Leonard Downing  REFERRING PROVIDER: Meredith Pel, MD  REFERRING DIAG: [phone] (ICD-10-CM) - Status post total left knee replacement  THERAPY DIAG:  Chronic pain of left knee  Stiffness of left knee, not elsewhere classified  Muscle weakness (generalized)  Difficulty in walking, not elsewhere classified  Localized edema  Rationale for Evaluation and Treatment: Rehabilitation  ONSET DATE: 12/12/2022 TKA Lt  SUBJECTIVE:   SUBJECTIVE STATEMENT: She reported a little pain in back of knee today.  Reported that her hip was  doing better.   PERTINENT HISTORY: MVR, arthritis, back surgery.  Negative doppler for DVT 12/26/2022 PAIN:  NPRS scale: knee 3/10 Pain location: Lt knee from surgery  Pain description: tightness and jabbing  Aggravating factors: holding leg out (like seated SLR) Relieving factors: tennis ball massage  PRECAUTIONS: None  WEIGHT BEARING RESTRICTIONS: No  FALLS:  Has patient fallen in last 6 months? No  LIVING ENVIRONMENT: Lives with: lives with their spouse Lives in: House/apartment Stairs: 1 step to enter no handrail Has following equipment at home: FWW, [place]: Retired  PLOF: Independent, work in Camera operator (goal)  PATIENT GOALS: Reduce pain, start walking independently, do yard/flower work.    OBJECTIVE:   PATIENT SURVEYS:  01/06/2023 FOTO intake: 56    predicted:  62  COGNITION: 01/06/2023 Overall cognitive status: WFL    SENSATION: 01/06/2023 No testing today  EDEMA:  01/06/2023 Visual edema around Lt knee  MUSCLE LENGTH: 01/06/2023 No specific testing today  POSTURE: 01/06/2023 Forward trunk lean, forward head  PALPATION:  01/06/2023 General tenderness around Lt knee   LOWER EXTREMITY ROM:   ROM Right 01/06/2023 Left 01/06/2023 Left 01/08/23 Left 01/13/2023 Left 01/22/2023  Hip flexion       Hip extension       Hip abduction       Hip adduction       Hip internal rotation       Hip external rotation       Knee flexion  115 in supine AROM heel slide c pain     Knee extension  -26 AROM in seated LAQ  PROM in heel prop -9 AROM -17* supine with heel prop -14 AROM in seated LAQ -5 AROM in seated LAQ  Ankle dorsiflexion       Ankle plantarflexion       Ankle inversion       Ankle eversion        (Blank rows = not tested)  LOWER EXTREMITY MMT:  MMT Right 01/06/2023 Left 01/06/2023 Left 01/22/2023  Hip flexion 5/5 4/5   Hip extension     Hip abduction     Hip adduction     Hip internal rotation     Hip external rotation     Knee flexion 5/5 4/5    Knee extension 5/5 2/5 3+/5  Ankle dorsiflexion 5/5 4/5   Ankle plantarflexion     Ankle inversion     Ankle eversion      (Blank rows = not tested)  LOWER EXTREMITY SPECIAL TESTS:  01/06/2023 None performed in clinic   FUNCTIONAL TESTS:  01/06/2023 18 inch chair transfer: unable without UE assist  Lt SLS: unable unassisted Rt SLS: not tested  GAIT: 01/20/2023:  Able to perform ambulation in clinic c SPC in Rt UE c SBA to supervision.   01/06/2023 In clinic FWW c step to gait pattern, increased forward trunk lean.  Antalgic gait on Lt leg.    TODAY'S TREATMENT                                                                          DATE: 01/22/2023 TherEx Recumbent bike lvl 1 6 mins, seat 5 Standing gastroc stretch on incline board 30 sec x 3  Leg press double leg 62 lbs x 15, Lt leg only 2 x 10 31 lb Seated Lt LAQ c end range pauses 2 x 15c 4 lb weight Seated Lt leg quad set 5 sec hold  x 10  Seated Lt leg SLR 2 x 10  Seated hamstring curl blue band 2 x 10  Step on over and down WB on Lt leg with single rail assist x 15  Vasopneumatic Device Deferred today per Pt. Agreement - will use ice machine at home  TODAY'S TREATMENT                                                                          DATE: 01/20/2023 TherEx Recumbent bike lvl 1 6 mins,  seat 5 Standing gastroc stretch on incline board 30 sec x 3  Leg press double leg 50 lbs x 15, Lt leg only 2 x 10 25 lb Seated Lt LAQ c end range pauses  x 10 (ROM focus), 2 x 10 c 4 lb weight Seated Lt leg quad set 5 sec hold 2 x 10  Seated Lt leg SLR 2 x 10  Step up 4inch step WB Lt leg , Lt hand light touch on rail x15 CGA gait belt Sit to stand to sit 21 inch table height no UE assist x 10   Neuro Re-ed Tandem stance on foam 1 min x 1 bilateral c occasional to moderate HHA Pre gait step over 4 inch step c stance leg fwd/retro step x 15 bilateral with one hand on bar c CGA gait belt   Vasopneumatic Device Lt leg 10 mins 34  degrees medium compression in elevation supine   TODAY'S TREATMENT                                                                          DATE: 01/15/2023 TherEx Nustep L1 x6 minutes BLEs only for warmup HS stretches 3x30 seconds L LE supine  SAQs 3# x15 with 3 second holds L LE  LAQs 3# x15 with 3 second holds L LE Gait training with SPC 149ft, 75ft Mod cues for coordination with SPC/L LE, step length R LE, upright posture   Manual Patella mobilizations all directions  Knee extension OP x10 with heel prop  TODAY'S TREATMENT                                                                          DATE: 01/13/2023 TherEx Recumbent bike lvl 1 6 mins, seat 5 Standing gastroc stretch on incline board 30 sec x 3  Leg press double leg 50 lbs x 15, Lt leg only 2 x 10 25 lb Seated Lt LAQ c end range pauses 2 x 10 (ROM focus), 2 x 10 c 3 lb weight Seated Lt leg quad set 5 sec hold 2 x 10  Seated isometric Lt knee extension/flexion painfree to tolerance 5 sec alternating x 12 each way  Vasopneumatic Device Lt leg 10 mins 34 degrees medium compression in elevation supine   PATIENT EDUCATION:  01/06/2023 Education details: tennis ball STM to quad at home  Person educated: Patient Education method: Programmer, multimedia, Demonstration, Verbal cues, and Handouts Education comprehension: verbalized understanding, returned demonstration, and verbal cues required  HOME EXERCISE PROGRAM: Access Code: [email or web address] URL: [email or web address] Date: 01/06/2023 Prepared by: Chyrel Masson  Exercises - Supine Heel Slide (Mirrored)  - 3-5 x daily - 7 x weekly - 1 sets - 10 reps - 5 hold - Seated Long Arc Quad (Mirrored)  - 3-5 x daily - 7 x weekly - 1 sets - 5-10 reps - 2 hold - Supine Knee Extension Mobilization with Weight (Mirrored)  - 4-5 x daily - 7 x weekly -  1 sets - 1 reps - to tolerance up to 15 mins hold - Seated Quad Set (Mirrored)  - 3-5 x daily - 7 x weekly - 1 sets - 10 reps - 5  hold  ASSESSMENT:  CLINICAL IMPRESSION: She has continued to show improved quad activation and improvement in reducing extension deficits actively.  SPC use to and in clinic today c modified independent.     OBJECTIVE IMPAIRMENTS: Abnormal gait, decreased activity tolerance, decreased balance, decreased coordination, decreased endurance, decreased mobility, difficulty walking, decreased ROM, decreased strength, hypomobility, increased edema, increased fascial restrictions, impaired perceived functional ability, impaired flexibility, improper body mechanics, and postural dysfunction  ACTIVITY LIMITATIONS: lifting, bending, sitting, standing, squatting, sleeping, stairs, transfers, bed mobility, dressing, and locomotion level  PARTICIPATION LIMITATIONS: interpersonal relationship, shopping, community activity, and yard work  PERSONAL FACTORS:  Concurrent back/radicular symptoms in same leg  are also affecting patient's functional outcome.   REHAB POTENTIAL: Good  CLINICAL DECISION MAKING: Stable/uncomplicated  EVALUATION COMPLEXITY: Low   GOALS: Goals reviewed with patient? Yes  SHORT TERM GOALS: (target date for Short term goals are 3 weeks 01/27/2023)   1.  Patient will demonstrate independent use of home exercise program to maintain progress from in clinic treatments.  Goal status: Met 01/20/2023  LONG TERM GOALS: (target dates for all long term goals are 10 weeks  03/17/2023 )   1. Patient will demonstrate/report pain at worst less than or equal to 2/10 to facilitate minimal limitation in daily activity secondary to pain symptoms.  Goal status: on going 01/20/2023   2. Patient will demonstrate independent use of home exercise program to facilitate ability to maintain/progress functional gains from skilled physical therapy services.  Goal status: on going 01/20/2023   3. Patient will demonstrate FOTO outcome > or = 62 % to indicate reduced disability due to condition.  Goal  status: on going 01/20/2023   4.  Patient will demonstrate Lt LE MMT 5/5 throughout to faciltiate usual transfers, stairs, squatting at [facility] for daily life.   Goal status: on going 01/20/2023   5.  Patient will demonstrate Lt knee AROM 0-110 degrees to facilitate transfers, ambulation, standing/walking at PLOF.  Goal status: on going 01/20/2023   6.  Patient will demonstrate independent ambulation > 300 ft to facilitate community integration s antalgic gait.  Goal status: on going 01/20/2023   7.  Patient will demonstrate ability to ascend/descend several stairs s UE assist c reciprocal gait pattern for household and community integration.  Goal Status: on going 01/20/2023   PLAN:  PT FREQUENCY: 1-2x/week  PT DURATION: 10 weeks  PLANNED INTERVENTIONS: Therapeutic exercises, Therapeutic activity, Neuro Muscular re-education, Balance training, Gait training, Patient/Family education, Joint mobilization, Stair training, DME instructions, Dry Needling, Electrical stimulation, Traction, Cryotherapy, vasopneumatic deviceMoist heat, Taping, Ultrasound, Ionotophoresis 4mg /ml Dexamethasone, and Manual therapy.  All included unless contraindicated  PLAN FOR NEXT SESSION: Static and dynamic balance progression, strengthening continued.    , PT, DPT, OCS, ATC 01/22/23  12:07 PM

## 2023-01-27 ENCOUNTER — Other Ambulatory Visit: Payer: Self-pay | Admitting: Physical Medicine and Rehabilitation

## 2023-01-27 ENCOUNTER — Ambulatory Visit (INDEPENDENT_AMBULATORY_CARE_PROVIDER_SITE_OTHER): Payer: Medicare Other | Admitting: Rehabilitative and Restorative Service Providers"

## 2023-01-27 ENCOUNTER — Encounter: Payer: Self-pay | Admitting: Rehabilitative and Restorative Service Providers"

## 2023-01-27 DIAGNOSIS — G8929 Other chronic pain: Secondary | ICD-10-CM

## 2023-01-27 DIAGNOSIS — M25662 Stiffness of left knee, not elsewhere classified: Secondary | ICD-10-CM | POA: Diagnosis not present

## 2023-01-27 DIAGNOSIS — R262 Difficulty in walking, not elsewhere classified: Secondary | ICD-10-CM

## 2023-01-27 DIAGNOSIS — M6281 Muscle weakness (generalized): Secondary | ICD-10-CM | POA: Diagnosis not present

## 2023-01-27 DIAGNOSIS — M25562 Pain in left knee: Secondary | ICD-10-CM

## 2023-01-27 DIAGNOSIS — R600 Localized edema: Secondary | ICD-10-CM

## 2023-01-27 NOTE — Therapy (Addendum)
OUTPATIENT PHYSICAL THERAPY TREATMENT /DISCHARGE   Patient Name: Becky Murphy MRN: [id number] DOB:07/25/1951, 72 y.o., female Today's Date: 01/27/2023  END OF SESSION:  PT End of Session - 01/27/23 1102     Visit Number 7    Number of Visits 20    Date for PT Re-Evaluation 03/17/23    Authorization Type Medicare    Progress Note Due on Visit 10    PT Start Time 1057    PT Stop Time 1137    PT Time Calculation (min) 40 min    Activity Tolerance Patient tolerated treatment well    Behavior During Therapy [facility] for tasks assessed/performed                   Past Medical History:  Diagnosis Date   Anemia    low iron in the past   Anxiety    pt denies   Arthritis    "back?" (11/03/2015) knees/hands (08/28/22)   Chest pain 11/02/2015   Depression    pt denies   Exertional dyspnea 01/05/2016   GERD (gastroesophageal reflux disease)    Heart murmur    History of peptic ulcer "late 1970's"   Migraine    "maybe 3-4/yr now" (11/03/2015)   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   Complex valvuloplasty including triangular resection of posterior leaflet, artificial Gore-tex neochord placement x6 and 34 mm Memo 3D ring annuloplasty via right mini thoracotomy approach with clipping of LA appendage   Thyroid nodule 01/29/2016   2.4 cm enhancing mixed cystic and solid nodule inferior left thyroid gland noted on CT angiogram   Past Surgical History:  Procedure Laterality Date   BACK SURGERY     BREAST BIOPSY Left ~ 2005   BREAST LUMPECTOMY Left ~ 2005   CARDIAC CATHETERIZATION N/A 01/16/2016    Procedure: Right/Left Heart Cath and Coronary Angiography;  Surgeon: Sanda Klein, MD;  Location: [facility];  Service: Cardiovascular;  Laterality: N/A;   CLIPPING OF ATRIAL APPENDAGE Left 03/06/2016   Procedure: CLIPPING OF LEFT ATRIAL APPENDAGE using a 70 PRO2 AtriClip;  Surgeon: Rexene Alberts, MD;  Location: [place];  Service: Open Heart Surgery;  Laterality: Left;   COLONOSCOPY     EUS N/A 02/20/2016   Procedure: ESOPHAGEAL ENDOSCOPIC ULTRASOUND (EUS) RADIAL;  Surgeon: Arta Silence, MD;  Location: [facility];  Service: Endoscopy;  Laterality: N/A;   EYE MUSCLE SURGERY Bilateral 1970's?   KNEE ARTHROSCOPY Right 04/04/2020   Procedure: RIGHT KNEE ARTHROSCOPY WITH DEBRIDEMENT;  Surgeon: Meredith Pel, MD;  Location: [place];  Service: Orthopedics;  Laterality: Right;   KNEE ARTHROSCOPY Right 04/10/2021   Procedure: right knee arthroscopy, debridement, placement of antibiotic beads;  Surgeon: Meredith Pel, MD;  Location: [place];  Service: Orthopedics;  Laterality: Right;   KNEE ARTHROSCOPY Left 12/10/2021   Procedure: LEFT KNEE ARTHROSCOPY, DEBRIDEMENT, PLACEMENT OF STIMULAN BEADS;  Surgeon: Meredith Pel, MD;  Location: [place];  Service: Orthopedics;  Laterality: Left;   [place]   "trimmed bulges off both sides"   MITRAL VALVE REPAIR Right 03/06/2016  Procedure: MINIMALLY INVASIVE MITRAL VALVE REPAIR (MVR) using a 34 Sorin Memo 3D Ring;  Surgeon: Rexene Alberts, MD;  Location: [place];  Service: Open Heart Surgery;  Laterality: Right;   TEE WITHOUT CARDIOVERSION N/A 01/05/2016   Procedure: TRANSESOPHAGEAL ECHOCARDIOGRAM (TEE);  Surgeon: Sanda Klein, MD;  Location: [facility];  Service: Cardiovascular;  Laterality: N/A;   TEE WITHOUT CARDIOVERSION N/A 03/06/2016   Procedure: TRANSESOPHAGEAL ECHOCARDIOGRAM (TEE);  Surgeon: Rexene Alberts, MD;  Location: [place];  Service: Open Heart Surgery;  Laterality: N/A;   TOTAL KNEE ARTHROPLASTY Right 09/03/2022    Procedure: RIGHT TOTAL KNEE ARTHROPLASTY-CEMENTED;  Surgeon: Meredith Pel, MD;  Location: [place];  Service: Orthopedics;  Laterality: Right;   TOTAL KNEE ARTHROPLASTY Left 12/12/2022   Procedure: LEFT TOTAL KNEE ARTHROPLASTY;  Surgeon: Meredith Pel, MD;  Location: [place];  Service: Orthopedics;  Laterality: Left;   TUBAL LIGATION     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   High risk medication use 04/17/2022   Vitamin B 12 deficiency 04/03/2022   Iron deficiency anemia 03/29/2022   Bilateral wrist pain 02/27/2022   Hemarthrosis 02/08/2022   Synovitis of left knee 07/19/2021   Vancomycin adverse reaction 04/19/2021   Therapeutic drug monitoring 04/19/2021   Synovitis of right knee 04/04/2020   Long term (current) use of anticoagulants [Z79.01] 03/14/2016   S/P minimally invasive mitral valve repair 03/06/2016   Pancreatic mass 01/29/2016   Thyroid nodule 01/29/2016   Exertional dyspnea 01/05/2016   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   Mitral insufficiency    Mitral valve prolapse 12/26/2015   Anxiety 11/02/2015    PCP: Leonard Downing  REFERRING PROVIDER: Meredith Pel, MD  REFERRING DIAG: [phone] (ICD-10-CM) - Status post total left knee replacement  THERAPY DIAG:  Chronic pain of left knee  Stiffness of left knee, not elsewhere classified  Muscle weakness (generalized)  Difficulty in walking, not elsewhere classified  Localized edema  Rationale for Evaluation and Treatment: Rehabilitation  ONSET DATE: 12/12/2022 TKA Lt  SUBJECTIVE:   SUBJECTIVE STATEMENT: She indicated a little pain in knee.  She indicated shot in back seemed to help back/leg complaints.  With  that doing better, she mentioned feeling the knee a little more.   PERTINENT HISTORY: MVR, arthritis, back surgery.  Negative doppler for DVT 12/26/2022 PAIN:  NPRS scale: knee 3/10 Pain location: Lt knee from surgery  Pain description: tightness and jabbing  Aggravating factors: holding leg out (like seated SLR) Relieving factors: tennis ball massage  PRECAUTIONS: None  WEIGHT BEARING RESTRICTIONS: No  FALLS:  Has patient fallen in last 6 months? No  LIVING ENVIRONMENT: Lives with: lives with their spouse Lives in: House/apartment Stairs: 1 step to enter no handrail Has following equipment at home: FWW, Independence: Retired  PLOF: Independent, work in Camera operator (goal)  PATIENT GOALS: Reduce pain, start walking independently, do yard/flower work.    OBJECTIVE:   PATIENT SURVEYS:  01/06/2023 FOTO intake: 56    predicted:  62  COGNITION: 01/06/2023 Overall cognitive status: WFL    SENSATION: 01/06/2023 No testing today  EDEMA:  01/06/2023 Visual edema around Lt knee  MUSCLE LENGTH: 01/06/2023 No specific testing today  POSTURE: 01/06/2023 Forward  trunk lean, forward head  PALPATION: 01/06/2023 General tenderness around Lt knee   LOWER EXTREMITY ROM:   ROM Right 01/06/2023 Left 01/06/2023 Left 01/08/23 Left 01/13/2023 Left 01/22/2023  Hip flexion       Hip extension       Hip abduction       Hip adduction       Hip internal rotation       Hip external rotation       Knee flexion  115 in supine AROM heel slide c pain     Knee extension  -26 AROM in seated LAQ  PROM in heel prop -9 AROM -17* supine with heel prop -14 AROM in seated LAQ -5 AROM in seated LAQ  Ankle dorsiflexion       Ankle plantarflexion       Ankle inversion       Ankle eversion        (Blank rows = not tested)  LOWER EXTREMITY MMT:  MMT Right 01/06/2023 Left 01/06/2023 Left 01/22/2023  Hip flexion 5/5 4/5   Hip extension     Hip abduction     Hip adduction     Hip internal rotation      Hip external rotation     Knee flexion 5/5 4/5   Knee extension 5/5 2/5 3+/5  Ankle dorsiflexion 5/5 4/5   Ankle plantarflexion     Ankle inversion     Ankle eversion      (Blank rows = not tested)  LOWER EXTREMITY SPECIAL TESTS:  01/06/2023 None performed in clinic   FUNCTIONAL TESTS:  01/06/2023 18 inch chair transfer: unable without UE assist  Lt SLS: unable unassisted Rt SLS: not tested  GAIT: 01/20/2023:  Able to perform ambulation in clinic c SPC in Rt UE c SBA to supervision.   01/06/2023 In clinic FWW c step to gait pattern, increased forward trunk lean.  Antalgic gait on Lt leg.    TODAY'S TREATMENT                                                                          DATE: 01/27/2023 TherEx Recumbent bike lvl 1 6 mins, seat 4 Standing gastroc stretch on incline board 30 sec x 3  Leg press double leg 62 lbs x 20, Lt leg only 2 x 10 37 lb Seated Lt LAQ c end range pauses 2 x 15 c 4 lb weight Step on over and down 4 inch step WB on Lt leg with single rail assist x 20 Heel/toe alternating lifts with light hand assist on bar x20 each way Sit to stand to sit no UE assist 18 inch chair c aireex pad  x 10 c slow sit down focus   Neuro Re-ed Tandem stance 1 min x 1 bilateral on foam  SLS c cone touching anterior/anterior-lateral/anterior-medial with one hand HHA on bar x8 each cone bilateral  TODAY'S TREATMENT  DATE: 01/22/2023 TherEx Recumbent bike lvl 1 6 mins, seat 5 Standing gastroc stretch on incline board 30 sec x 3  Leg press double leg 62 lbs x 15, Lt leg only 2 x 10 31 lb Seated Lt LAQ c end range pauses 2 x 15c 4 lb weight Seated Lt leg quad set 5 sec hold  x 10  Seated Lt leg SLR 2 x 10  Seated hamstring curl blue band 2 x 10  Step on over and down WB on Lt leg with single rail assist x 15  Vasopneumatic Device Deferred today per Pt. Agreement - will use ice machine at home  TODAY'S  TREATMENT                                                                          DATE: 01/20/2023 TherEx Recumbent bike lvl 1 6 mins, seat 5 Standing gastroc stretch on incline board 30 sec x 3  Leg press double leg 50 lbs x 15, Lt leg only 2 x 10 25 lb Seated Lt LAQ c end range pauses  x 10 (ROM focus), 2 x 10 c 4 lb weight Seated Lt leg quad set 5 sec hold 2 x 10  Seated Lt leg SLR 2 x 10  Step up 4inch step WB Lt leg , Lt hand light touch on rail x15 CGA gait belt Sit to stand to sit 21 inch table height no UE assist x 10   Neuro Re-ed Tandem stance on foam 1 min x 1 bilateral c occasional to moderate HHA Pre gait step over 4 inch step c stance leg fwd/retro step x 15 bilateral with one hand on bar c CGA gait belt   Vasopneumatic Device Lt leg 10 mins 34 degrees medium compression in elevation supine   TODAY'S TREATMENT                                                                          DATE: 01/15/2023 TherEx Nustep L1 x6 minutes BLEs only for warmup HS stretches 3x30 seconds L LE supine  SAQs 3# x15 with 3 second holds L LE  LAQs 3# x15 with 3 second holds L LE Gait training with SPC 172f, 975fMod cues for coordination with SPC/L LE, step length R LE, upright posture   Manual Patella mobilizations all directions  Knee extension OP x10 with heel prop  PATIENT EDUCATION:  01/06/2023 Education details: tennis ball STM to quad at home  Person educated: Patient Education method: ExConsulting civil engineerDemonstration, Verbal cues, and Handouts Education comprehension: verbalized understanding, returned demonstration, and verbal cues required  HOME EXERCISE PROGRAM: Access Code: [email or web address] Date: 01/06/2023 Prepared by: MiScot JunExercises - Supine Heel Slide (Mirrored)  - 3-5 x daily - 7 x weekly - 1 sets - 10 reps - 5 hold - Seated Long Arc Quad (Mirrored)  - 3-5 x daily - 7 x weekly - 1 sets - 5-10 reps -  2 hold - Supine Knee  Extension Mobilization with Weight (Mirrored)  - 4-5 x daily - 7 x weekly - 1 sets - 1 reps - to tolerance up to 15 mins hold - Seated Quad Set (Mirrored)  - 3-5 x daily - 7 x weekly - 1 sets - 10 reps - 5 hold  ASSESSMENT:  CLINICAL IMPRESSION: Pt to continue to benefit from progressive strengthening and balance improvements to improve stability in ambulation and improve stair navigation for community integration.  Steady overall progress continued overall at this time.  Continued skilled PT services warranted at this time.    OBJECTIVE IMPAIRMENTS: Abnormal gait, decreased activity tolerance, decreased balance, decreased coordination, decreased endurance, decreased mobility, difficulty walking, decreased ROM, decreased strength, hypomobility, increased edema, increased fascial restrictions, impaired perceived functional ability, impaired flexibility, improper body mechanics, and postural dysfunction  ACTIVITY LIMITATIONS: lifting, bending, sitting, standing, squatting, sleeping, stairs, transfers, bed mobility, dressing, and locomotion level  PARTICIPATION LIMITATIONS: interpersonal relationship, shopping, community activity, and yard work  PERSONAL FACTORS:  Concurrent back/radicular symptoms in same leg  are also affecting patient's functional outcome.   REHAB POTENTIAL: Good  CLINICAL DECISION MAKING: Stable/uncomplicated  EVALUATION COMPLEXITY: Low   GOALS: Goals reviewed with patient? Yes  SHORT TERM GOALS: (target date for Short term goals are 3 weeks 01/27/2023)   1.  Patient will demonstrate independent use of home exercise program to maintain progress from in clinic treatments.  Goal status: Met 01/20/2023  LONG TERM GOALS: (target dates for all long term goals are 10 weeks  03/17/2023 )   1. Patient will demonstrate/report pain at worst less than or equal to 2/10 to facilitate minimal limitation in daily activity secondary to pain symptoms.  Goal status: on going  01/20/2023   2. Patient will demonstrate independent use of home exercise program to facilitate ability to maintain/progress functional gains from skilled physical therapy services.  Goal status: on going 01/20/2023   3. Patient will demonstrate FOTO outcome > or = 62 % to indicate reduced disability due to condition.  Goal status: on going 01/20/2023   4.  Patient will demonstrate Lt LE MMT 5/5 throughout to faciltiate usual transfers, stairs, squatting at [facility] for daily life.   Goal status: on going 01/20/2023   5.  Patient will demonstrate Lt knee AROM 0-110 degrees to facilitate transfers, ambulation, standing/walking at PLOF.  Goal status: on going 01/20/2023   6.  Patient will demonstrate independent ambulation > 300 ft to facilitate community integration s antalgic gait.  Goal status: on going 01/20/2023   7.  Patient will demonstrate ability to ascend/descend several stairs s UE assist c reciprocal gait pattern for household and community integration.  Goal Status: on going 01/20/2023   PLAN:  PT FREQUENCY: 1-2x/week  PT DURATION: 10 weeks  PLANNED INTERVENTIONS: Therapeutic exercises, Therapeutic activity, Neuro Muscular re-education, Balance training, Gait training, Patient/Family education, Joint mobilization, Stair training, DME instructions, Dry Needling, Electrical stimulation, Traction, Cryotherapy, vasopneumatic deviceMoist heat, Taping, Ultrasound, Ionotophoresis '4mg'$ /ml Dexamethasone, and Manual therapy.  All included unless contraindicated  PLAN FOR NEXT SESSION: Static and dynamic balance progression, strengthening for quad   Scot Jun, PT, DPT, OCS, ATC 01/27/23  11:35 AM  PHYSICAL THERAPY DISCHARGE SUMMARY  Visits from Start of Care: 7  Current functional level related to goals / functional outcomes: See note   Remaining deficits: See note   Education / Equipment: HEP  Patient goals were partially met. Patient is being discharged due to not  returning since  the last visit.  Scot Jun, PT, DPT, OCS, ATC 03/05/23  11:17 AM

## 2023-01-28 NOTE — Progress Notes (Signed)
IYONA PEHRSON - 72 y.o. female MRN [id number]  Date of birth: [birth year]  Office Visit Note: Visit Date: 01/20/2023 PCP: Leonard Downing, MD Referred by: Leonard Downing, *  Subjective: Chief Complaint  Patient presents with   Lower Back - Pain   HPI:  ALAZE GARVERICK is a 72 y.o. female who comes in today at the request of Barnet Pall, FNP and Annie Main, PA-C for planned Left L4-5 Lumbar Interlaminar epidural steroid injection with fluoroscopic guidance.  The patient has failed conservative care including home exercise, medications, time and activity modification.  This injection will be diagnostic and hopefully therapeutic.  Please see requesting physician notes for further details and justification.   ROS Otherwise per HPI.  Assessment & Plan: Visit Diagnoses:    ICD-10-CM   1. Lumbar radiculopathy  M54.16 XR C-ARM NO REPORT    Epidural Steroid injection    methylPREDNISolone acetate (DEPO-MEDROL) injection 80 mg      Plan: No additional findings.   Meds & Orders:  Meds ordered this encounter  Medications   methylPREDNISolone acetate (DEPO-MEDROL) injection 80 mg    Orders Placed This Encounter  Procedures   XR C-ARM NO REPORT   Epidural Steroid injection    Follow-up: Return for visit to requesting provider as needed.   Procedures: No procedures performed  Lumbar Epidural Steroid Injection - Interlaminar Approach with Fluoroscopic Guidance  Patient: JULINE SANDERFORD      Date of Birth: Jun 29, 1951 MRN: [id number] PCP: Leonard Downing, MD      Visit Date: 01/20/2023   Universal Protocol:     Consent Given By: the patient  Position: PRONE  Additional Comments: Vital signs were monitored before and after the procedure. Patient was prepped and draped in the usual sterile fashion. The correct patient, procedure, and site was verified.   Injection Procedure Details:   Procedure diagnoses: Lumbar radiculopathy [M54.16]   Meds  Administered:  Meds ordered this encounter  Medications   methylPREDNISolone acetate (DEPO-MEDROL) injection 80 mg     Laterality: Left  Location/Site:  L4-5  Needle: 3.5 in., 20 ga. Tuohy  Needle Placement: Paramedian epidural  Findings:   -Comments: Excellent flow of contrast into the epidural space.  Procedure Details: Using a paramedian approach from the side mentioned above, the region overlying the inferior lamina was localized under fluoroscopic visualization and the soft tissues overlying this structure were infiltrated with 4 ml. of 1% Lidocaine without Epinephrine. The Tuohy needle was inserted into the epidural space using a paramedian approach.   The epidural space was localized using loss of resistance along with counter oblique bi-planar fluoroscopic views.  After negative aspirate for air, blood, and CSF, a 2 ml. volume of Isovue-250 was injected into the epidural space and the flow of contrast was observed. Radiographs were obtained for documentation purposes.    The injectate was administered into the level noted above.   Additional Comments:  No complications occurred Dressing: 2 x 2 sterile gauze and Band-Aid    Post-procedure details: Patient was observed during the procedure. Post-procedure instructions were reviewed.  Patient left the clinic in stable condition.   Clinical History: MRI LUMBAR SPINE WITHOUT CONTRAST   TECHNIQUE: Multiplanar, multisequence MR imaging of the lumbar spine was performed. No intravenous contrast was administered.   COMPARISON:  CT abdomen 03/04/2016   FINDINGS: Segmentation:  Standard.   Alignment: Mild lumbar dextroscoliosis. Mild straightening of the normal lumbar lordosis. No significant listhesis in the sagittal plane.  Vertebrae: No fracture, suspicious marrow lesion, or significant marrow edema. Mild multilevel chronic degenerative endplate changes.   Conus medullaris and cauda equina: Conus extends to the  T12-L1 level. Conus and cauda equina appear normal. No evidence of an epidural hematoma.   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   Disc levels:   Disc desiccation throughout the lumbar spine. Mild disc space narrowing from L1-2 through L3-4 and moderate to severe narrowing at L4-5 and L5-S1.   T12-L1: Negative.   L1-2: Mild disc bulging and mild facet hypertrophy without stenosis.   L2-3: Left eccentric disc bulging and mild facet and ligamentum flavum hypertrophy result in borderline to mild left greater than right lateral recess stenosis and mild left neural foraminal stenosis without spinal stenosis.   L3-4: Disc bulging and mild-to-moderate facet and ligamentum flavum hypertrophy result in mild bilateral lateral recess stenosis and borderline bilateral neural foraminal stenosis without significant spinal stenosis.   L4-5: Disc bulging and mild facet and ligamentum flavum hypertrophy result in mild-to-moderate left greater than right lateral recess stenosis and mild right and borderline left neural foraminal stenosis without spinal stenosis.   L5-S1: Prior laminectomies. Disc bulging, endplate spurring, and mild facet hypertrophy without stenosis.   IMPRESSION: 1. No evidence of an epidural hematoma. 2. Multilevel lumbar disc and facet degeneration with multilevel lateral recess stenosis as above, greatest at L4-5.     Electronically Signed   By: Logan Bores M.D.   On: 12/26/2022 18:59     Objective:  VS:  HT:    WT:   BMI:     BP:126/88  HR:94bpm  TEMP: ( )  RESP:  Physical Exam Vitals and nursing note reviewed.  Constitutional:      General: She is not in acute distress.    Appearance: Normal appearance. She is not ill-appearing.  HENT:     Head: Normocephalic and atraumatic.     Right Ear: External ear normal.     Left Ear: External ear normal.  Eyes:      Extraocular Movements: Extraocular movements intact.  Cardiovascular:     Rate and Rhythm: Normal rate.     Pulses: Normal pulses.  Pulmonary:     Effort: Pulmonary effort is normal. No respiratory distress.  Abdominal:     General: There is no distension.     Palpations: Abdomen is soft.  Musculoskeletal:        General: Tenderness present.     Cervical back: Neck supple.     Right lower leg: No edema.     Left lower leg: No edema.     Comments: Patient has good distal strength with no pain over the greater trochanters.  No clonus or focal weakness.  Skin:    Findings: No erythema, lesion or rash.  Neurological:     General: No focal deficit present.     Mental Status: She is alert and oriented to person, place, and time.     Sensory: No sensory deficit.     Motor: No weakness or abnormal muscle tone.     Coordination: Coordination normal.  Psychiatric:        Mood and Affect: Mood normal.        Behavior: Behavior normal.      Imaging: No results found.

## 2023-01-28 NOTE — Procedures (Signed)
Lumbar Epidural Steroid Injection - Interlaminar Approach with Fluoroscopic Guidance  Patient: Becky Murphy      Date of Birth: [birth year] MRN: [id number] PCP: Leonard Downing, MD      Visit Date: 01/20/2023   Universal Protocol:     Consent Given By: the patient  Position: PRONE  Additional Comments: Vital signs were monitored before and after the procedure. Patient was prepped and draped in the usual sterile fashion. The correct patient, procedure, and site was verified.   Injection Procedure Details:   Procedure diagnoses: Lumbar radiculopathy [M54.16]   Meds Administered:  Meds ordered this encounter  Medications   methylPREDNISolone acetate (DEPO-MEDROL) injection 80 mg     Laterality: Left  Location/Site:  L4-5  Needle: 3.5 in., 20 ga. Tuohy  Needle Placement: Paramedian epidural  Findings:   -Comments: Excellent flow of contrast into the epidural space.  Procedure Details: Using a paramedian approach from the side mentioned above, the region overlying the inferior lamina was localized under fluoroscopic visualization and the soft tissues overlying this structure were infiltrated with 4 ml. of 1% Lidocaine without Epinephrine. The Tuohy needle was inserted into the epidural space using a paramedian approach.   The epidural space was localized using loss of resistance along with counter oblique bi-planar fluoroscopic views.  After negative aspirate for air, blood, and CSF, a 2 ml. volume of Isovue-250 was injected into the epidural space and the flow of contrast was observed. Radiographs were obtained for documentation purposes.    The injectate was administered into the level noted above.   Additional Comments:  No complications occurred Dressing: 2 x 2 sterile gauze and Band-Aid    Post-procedure details: Patient was observed during the procedure. Post-procedure instructions were reviewed.  Patient left the clinic in stable condition.

## 2023-01-29 ENCOUNTER — Encounter: Payer: Self-pay | Admitting: Orthopedic Surgery

## 2023-01-29 ENCOUNTER — Ambulatory Visit (INDEPENDENT_AMBULATORY_CARE_PROVIDER_SITE_OTHER): Payer: Medicare Other | Admitting: Orthopedic Surgery

## 2023-01-29 ENCOUNTER — Ambulatory Visit (INDEPENDENT_AMBULATORY_CARE_PROVIDER_SITE_OTHER): Payer: Medicare Other

## 2023-01-29 ENCOUNTER — Encounter: Payer: Medicare Other | Admitting: Physical Therapy

## 2023-01-29 DIAGNOSIS — Z96652 Presence of left artificial knee joint: Secondary | ICD-10-CM

## 2023-01-29 MED ORDER — GABAPENTIN 300 MG PO CAPS: 300.0000 mg | ORAL_CAPSULE | Freq: Three times a day (TID) | ORAL | 0 refills | Status: AC

## 2023-01-29 MED ORDER — OXYCODONE HCL 5 MG PO TABS: ORAL_TABLET | ORAL | 0 refills | Status: DC

## 2023-01-29 NOTE — Progress Notes (Signed)
Oral and  Post-Op Visit Note   Patient: Becky Murphy           Date of Birth: Jun 07, 1951           MRN: [id number] Visit Date: 01/29/2023 PCP: Leonard Downing, MD   Assessment & Plan:  Chief Complaint:  Chief Complaint  Patient presents with   Left Knee - Routine Post Op    12/12/22 left TKA   Visit Diagnoses:  1. History of total left knee replacement     Plan: Becky Murphy is a 72 year old patient is now 6 weeks out left total knee replacement.  Had about 1 week of increased pain in the left knee.  Ambulating with cane.  Going to physical therapy but that may be aggravating her some degree.  No fevers and chills.  Injection did help her back but she still is having some knee pain on the left.  On examination she has excellent range of motion from extension to about 115 of flexion.  No unexpected warmth or unexpected effusion in the knee.  Plan at this time is refill gabapentin refill oxycodone.  Physical therapy.  Come back in 4 weeks for clinical recheck.  Radiographs of the knee today unremarkable and unchanged from prior studies   Follow-Up Instructions: No follow-ups on file.   Orders:  Orders Placed This Encounter  Procedures   XR Knee 1-2 Views Left   Meds ordered this encounter  Medications   gabapentin (NEURONTIN) 300 MG capsule    Sig: Take 1 capsule (300 mg total) by mouth 3 (three) times daily.    Dispense:  90 capsule    Refill:  0   oxyCODONE (OXY IR/ROXICODONE) 5 MG immediate release tablet    Sig: 1 po q 8 hrs prn pain    Dispense:  30 tablet    Refill:  0    Imaging: No results found.  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   High risk medication use 04/17/2022   Vitamin  B 12 deficiency 04/03/2022   Iron deficiency anemia 03/29/2022   Bilateral wrist pain 02/27/2022   Hemarthrosis 02/08/2022   Synovitis of left knee 07/19/2021   Vancomycin adverse reaction 04/19/2021   Therapeutic drug monitoring 04/19/2021   Synovitis of right knee 04/04/2020   Long term (current) use of anticoagulants [Z79.01] 03/14/2016   S/P minimally invasive mitral valve repair 03/06/2016   Pancreatic mass 01/29/2016   Thyroid nodule 01/29/2016   Exertional dyspnea 01/05/2016   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   Mitral insufficiency    Mitral valve prolapse 12/26/2015   Anxiety 11/02/2015   Past Medical History:  Diagnosis Date   Anemia    low iron in the past   Anxiety    pt denies   Arthritis    "back?" (11/03/2015) knees/hands (08/28/22)   Chest pain 11/02/2015   Depression    pt denies   Exertional dyspnea 01/05/2016   GERD (gastroesophageal reflux disease)    Heart murmur    History of peptic ulcer "late 1970's"   Migraine    "maybe 3-4/yr now" (11/03/2015)   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   Complex valvuloplasty including triangular resection of posterior leaflet, artificial Gore-tex neochord placement x6 and 34 mm Memo 3D ring annuloplasty via right mini thoracotomy approach with clipping of LA appendage   Thyroid nodule 01/29/2016   2.4 cm enhancing mixed cystic and solid nodule inferior left thyroid gland noted on CT angiogram    Family History  Problem Relation Age of Onset   Hypertension Mother    Cancer Mother 76       MELANOMA   Cancer Father         PROSTATE   Cancer Sister 16       BREAST    Past Surgical History:  Procedure Laterality Date   BACK SURGERY     BREAST BIOPSY Left ~ 2005   BREAST LUMPECTOMY Left ~ 2005   CARDIAC CATHETERIZATION N/A 01/16/2016   Procedure: Right/Left Heart Cath and Coronary Angiography;  Surgeon: Sanda Klein, MD;  Location: [place] [facility];  Service: Cardiovascular;  Laterality: N/A;   CLIPPING OF ATRIAL APPENDAGE Left 03/06/2016   Procedure: CLIPPING OF LEFT ATRIAL APPENDAGE using a 5 PRO2 AtriClip;  Surgeon: Rexene Alberts, MD;  Location: [place];  Service: Open Heart Surgery;  Laterality: Left;   COLONOSCOPY     EUS N/A 02/20/2016   Procedure: ESOPHAGEAL ENDOSCOPIC ULTRASOUND (EUS) RADIAL;  Surgeon: Arta Silence, MD;  Location: [facility];  Service: Endoscopy;  Laterality: N/A;   EYE MUSCLE SURGERY Bilateral 1970's?   KNEE ARTHROSCOPY Right 04/04/2020   Procedure: RIGHT KNEE ARTHROSCOPY WITH DEBRIDEMENT;  Surgeon: Meredith Pel, MD;  Location: [place];  Service: Orthopedics;  Laterality: Right;   KNEE ARTHROSCOPY Right 04/10/2021   Procedure: right knee arthroscopy, debridement, placement of antibiotic beads;  Surgeon: Meredith Pel, MD;  Location: [place];  Service: Orthopedics;  Laterality: Right;   KNEE ARTHROSCOPY Left 12/10/2021   Procedure: LEFT KNEE ARTHROSCOPY, DEBRIDEMENT, PLACEMENT OF STIMULAN BEADS;  Surgeon: Meredith Pel, MD;  Location: [place];  Service: Orthopedics;  Laterality: Left;   [place]   "trimmed bulges off both sides"   MITRAL VALVE REPAIR Right 03/06/2016   Procedure: MINIMALLY INVASIVE MITRAL VALVE REPAIR (MVR) using a 62 Sorin Memo 3D Ring;  Surgeon: Rexene Alberts, MD;  Location: [place];  Service: Open Heart Surgery;  Laterality: Right;   TEE WITHOUT CARDIOVERSION N/A 01/05/2016   Procedure: TRANSESOPHAGEAL ECHOCARDIOGRAM (TEE);  Surgeon: Sanda Klein, MD;  Location: [facility];  Service: Cardiovascular;  Laterality: N/A;   TEE  WITHOUT CARDIOVERSION N/A 03/06/2016   Procedure: TRANSESOPHAGEAL ECHOCARDIOGRAM (TEE);  Surgeon: Rexene Alberts, MD;  Location: [place];  Service: Open Heart Surgery;  Laterality: N/A;   TOTAL KNEE ARTHROPLASTY Right 09/03/2022   Procedure: RIGHT TOTAL KNEE ARTHROPLASTY-CEMENTED;  Surgeon: Meredith Pel, MD;  Location: [place];  Service: Orthopedics;  Laterality: Right;   TOTAL KNEE ARTHROPLASTY Left 12/12/2022   Procedure: LEFT TOTAL KNEE ARTHROPLASTY;  Surgeon: Meredith Pel, MD;  Location: [place];  Service: Orthopedics;  Laterality: Left;   TUBAL LIGATION     Social History   Occupational History   Not on file  Tobacco Use   Smoking status:  Never    Passive exposure: Current   Smokeless tobacco: Never  Vaping Use   Vaping Use: Never used  Substance and Sexual Activity   Alcohol use: Yes    Alcohol/week: 10.0 standard drinks of alcohol    Types: 6 Glasses of wine, 4 Shots of liquor per week    Comment: wine every other day   Drug use: No   Sexual activity: Not Currently    Birth control/protection: Post-menopausal

## 2023-02-03 ENCOUNTER — Encounter: Payer: Medicare Other | Admitting: Physical Therapy

## 2023-02-05 ENCOUNTER — Inpatient Hospital Stay: Payer: Medicare Other

## 2023-02-07 ENCOUNTER — Encounter: Payer: Medicare Other | Admitting: Rehabilitative and Restorative Service Providers"

## 2023-02-10 ENCOUNTER — Encounter: Payer: Medicare Other | Admitting: Rehabilitative and Restorative Service Providers"

## 2023-02-11 ENCOUNTER — Telehealth: Payer: Self-pay | Admitting: Hematology and Oncology

## 2023-02-11 NOTE — Telephone Encounter (Signed)
Rescheduled appointment per provider PAL. Left voicemail. 

## 2023-02-12 ENCOUNTER — Encounter: Payer: Medicare Other | Admitting: Rehabilitative and Restorative Service Providers"

## 2023-02-17 ENCOUNTER — Encounter: Payer: Medicare Other | Admitting: Physical Therapy

## 2023-02-19 ENCOUNTER — Encounter: Payer: Medicare Other | Admitting: Physical Therapy

## 2023-02-20 ENCOUNTER — Inpatient Hospital Stay: Payer: Medicare Other

## 2023-02-20 ENCOUNTER — Inpatient Hospital Stay: Payer: Medicare Other | Admitting: Hematology and Oncology

## 2023-02-24 ENCOUNTER — Encounter: Payer: Medicare Other | Admitting: Physical Therapy

## 2023-02-26 ENCOUNTER — Encounter: Payer: Medicare Other | Admitting: Physical Therapy

## 2023-02-26 ENCOUNTER — Encounter: Payer: Medicare Other | Admitting: Orthopedic Surgery

## 2023-02-27 ENCOUNTER — Inpatient Hospital Stay: Payer: Medicare Other

## 2023-02-27 ENCOUNTER — Inpatient Hospital Stay: Payer: Medicare Other | Admitting: Hematology and Oncology

## 2023-03-05 ENCOUNTER — Inpatient Hospital Stay: Payer: Medicare Other

## 2023-03-06 ENCOUNTER — Ambulatory Visit: Payer: Medicare Other | Admitting: Internal Medicine

## 2023-03-18 ENCOUNTER — Ambulatory Visit: Payer: Medicare Other | Admitting: Internal Medicine

## 2023-03-25 ENCOUNTER — Other Ambulatory Visit: Payer: Self-pay | Admitting: Internal Medicine

## 2023-03-25 DIAGNOSIS — M1380 Other specified arthritis, unspecified site: Secondary | ICD-10-CM

## 2023-03-25 NOTE — Telephone Encounter (Signed)
Last Fill: 11/28/2022  Labs: 12/06/2022 Hgb 15.3  Next Visit: 04/07/2023  Last Visit: 11/28/2022  DX: Seronegative inflammatory arthritis   Current Dose per office note 11/28/2022: sulfasalazine 1000 mg TID   Patient to update labs at upcoming appointment on 04/07/2023.   Okay to refill Sulfasalazine?

## 2023-04-02 ENCOUNTER — Inpatient Hospital Stay: Payer: Medicare Other | Admitting: Hematology and Oncology

## 2023-04-02 ENCOUNTER — Inpatient Hospital Stay: Payer: Medicare Other

## 2023-04-07 ENCOUNTER — Encounter: Payer: Self-pay | Admitting: Internal Medicine

## 2023-04-07 ENCOUNTER — Ambulatory Visit: Payer: Medicare Other | Attending: Internal Medicine | Admitting: Internal Medicine

## 2023-04-07 VITALS — BP 143/97 | HR 77 | Resp 16 | Ht 66.0 in | Wt 146.0 lb

## 2023-04-07 DIAGNOSIS — M1380 Other specified arthritis, unspecified site: Secondary | ICD-10-CM | POA: Diagnosis not present

## 2023-04-07 DIAGNOSIS — M1710 Unilateral primary osteoarthritis, unspecified knee: Secondary | ICD-10-CM | POA: Insufficient documentation

## 2023-04-07 DIAGNOSIS — Z79899 Other long term (current) drug therapy: Secondary | ICD-10-CM

## 2023-04-07 NOTE — Progress Notes (Signed)
Office Visit Note  Patient: Becky Murphy             Date of Birth: [birth year]           MRN: [id number]             PCP: Kaleen Mask, MD Referring: Kaleen Mask, * Visit Date: 04/07/2023   Subjective:  Follow-up   History of Present Illness: Becky Murphy is a 71 y.o. 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  Since her last visit she had left knee replacement surgery which went well.  She completed the initial physical therapy course is doing really well currently walking now with a walker.  She is also been helping to take care and walking with her husband who had coronary artery bypass grafting.  She has not had any recurrent episodes of hand pain and swelling.  No significant interval infections or antibiotic use. She had some hair coloring agent exposure along her hairline with raised pink mildly itchy bumps for the past 6 weeks. Uses topical cortisone on these with partial improvement.  Previous HPI 11/28/22 Becky Murphy is a 72 y.o. 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  She has been doing quite well recently she has completed her course of formal physical therapy for right knee replacement surgery with good strength and mobility not having a lot of pain.  There is still a small amount of swelling in the knee.  She is now scheduled for knee replacement surgery on the left side December 14.  This with her hands are doing well.   Previous HPI 08/14/22 Becky Murphy is a 72 y.o. female here for follow up or follow up for seronegative inflammatory arthritis. She is scheduled for right knee arthroplasty 09/03/22 with Dr. August Saucer. She has continued feeling a good improvement in joint pains on continued SSZ 1000 mg TID. She has not had any major intolerance or infections or drug reactions. Does continue to have knee swelling and limited  mobility.   Previous HPI 02/08/22 Becky Murphy is a 72 y.o. female here for evaluation of recurrent bilateral knee effusions. These started about 2 years ago affecting the right knee. She has had repeated episodes of suspected infectious knee effusions with negative cultures. Treatment has included drainage, with subsequent surgical debridement twice in the right knee which improved symptoms a lot but never entirely to normal. Medications started with initial vancomycin treatment in April 2021. She suffered a reaction with rash developing that persists on her outer legs. Repeat antibiotics afterwards and again in April 2022 after additional surgery. She developed left knee effusion initially treated with serial aspiration and then left knee arthroscopy and debridement in December. MRI imaging of the knee and thigh demonstrated significant synovitis and possible hemarthrosis. Currently her left knee is swollen and hot with pain and decreased range of movement. She is ambulatory with a walker at home and using wheelchair for long distance travel. She has noticed significant loss of upper leg strength throughout this process. In addition to knee symptoms she has developed bilateral wrist nodules and pain she attributes to pressure having to transfer and stand using upper body due to leg pain and weakness. She has no past arthritis history before this started about 2 years ago. She has psoriasis typically affecting her upper torso not severe and not on long term  medication for this. 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 She had mitral valve replacement by thoracotomy in 2017 without infection or inflammatory pathology noted.    Synopvial fluid repeat aspirations 02/2021-10/2021 WBCs 8000-45000 Neutrophils 58-95% Crystals negative Microbiology negative   11/30/2021 MRI left femur IMPRESSION: Large heterogeneous left knee joint  effusion, partially visualized, with thick synovial enhancement suggestive of synovitis, possibly hemarthrosis. Intramuscular edema within the distal vastus medialis muscle, likely reactive. If clinically indicated, a dedicated left knee MRI may be useful for further evaluation.   Review of Systems  Constitutional:  Negative for fatigue.  HENT:  Negative for mouth sores and mouth dryness.   Eyes:  Negative for dryness.  Respiratory:  Negative for shortness of breath.   Cardiovascular:  Negative for chest pain and palpitations.  Gastrointestinal:  Negative for blood in stool, constipation and diarrhea.  Endocrine: Negative for increased urination.  Genitourinary:  Negative for involuntary urination.  Musculoskeletal:  Negative for joint pain, gait problem, joint pain, joint swelling, myalgias, muscle weakness, morning stiffness, muscle tenderness and myalgias.  Skin:  Negative for color change, rash, hair loss and sensitivity to sunlight.  Allergic/Immunologic: Negative for susceptible to infections.  Neurological:  Negative for dizziness and headaches.  Hematological:  Negative for swollen glands.  Psychiatric/Behavioral:  Positive for sleep disturbance. Negative for depressed mood. The patient is not nervous/anxious.     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   High risk medication use 04/17/2022   Vitamin B 12 deficiency 04/03/2022   Iron deficiency anemia 03/29/2022   Bilateral wrist pain 02/27/2022   Hemarthrosis 02/08/2022   Synovitis of left knee 07/19/2021   Vancomycin adverse reaction 04/19/2021   Therapeutic drug monitoring 04/19/2021   Synovitis of right knee 04/04/2020   Long term (current) use of  anticoagulants [Z79.01] 03/14/2016   S/P minimally invasive mitral valve repair 03/06/2016   Pancreatic mass 01/29/2016   Thyroid nodule 01/29/2016   Exertional dyspnea 01/05/2016   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   Mitral insufficiency    Mitral valve prolapse 12/26/2015   Anxiety 11/02/2015    Past Medical History:  Diagnosis Date   Anemia    low iron in the past   Anxiety    pt denies   Arthritis    "back?" (11/03/2015) knees/hands (08/28/22)   Chest pain 11/02/2015   Depression    pt denies   Exertional dyspnea 01/05/2016   GERD (gastroesophageal reflux disease)    Heart murmur    History of peptic ulcer "late 1970's"   Migraine    "maybe 3-4/yr now" (11/03/2015)   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   Complex valvuloplasty including triangular resection of posterior leaflet, artificial Gore-tex neochord placement x6 and 34 mm Memo 3D ring annuloplasty via right mini  thoracotomy approach with clipping of LA appendage   Thyroid nodule 01/29/2016   2.4 cm enhancing mixed cystic and solid nodule inferior left thyroid gland noted on CT angiogram    Family History  Problem Relation Age of Onset   Hypertension Mother    Cancer Mother 54       MELANOMA   Cancer Father        PROSTATE   Cancer Sister 25       BREAST   Past Surgical History:  Procedure Laterality Date   BACK SURGERY     BREAST BIOPSY Left ~ 2005   BREAST LUMPECTOMY Left ~ 2005   CARDIAC CATHETERIZATION N/A 01/16/2016   Procedure: Right/Left Heart Cath and Coronary Angiography;  Surgeon: Thurmon Fair, MD;  Location: [facility];  Service: Cardiovascular;  Laterality: N/A;   CLIPPING OF ATRIAL APPENDAGE Left 03/06/2016   Procedure: CLIPPING OF LEFT ATRIAL APPENDAGE using a 45 PRO2 AtriClip;  Surgeon: Purcell Nails, MD;  Location: [facility];  Service: Open Heart Surgery;  Laterality: Left;   COLONOSCOPY     EUS N/A 02/20/2016   Procedure: ESOPHAGEAL ENDOSCOPIC ULTRASOUND (EUS) RADIAL;  Surgeon: Willis Modena, MD;  Location: [facility];  Service: Endoscopy;  Laterality: N/A;   EYE MUSCLE SURGERY Bilateral 1970's?   KNEE ARTHROSCOPY Right 04/04/2020   Procedure: RIGHT KNEE ARTHROSCOPY WITH DEBRIDEMENT;  Surgeon: Cammy Copa, MD;  Location: [facility] OR;  Service: Orthopedics;  Laterality: Right;   KNEE ARTHROSCOPY Right 04/10/2021   Procedure: right knee arthroscopy, debridement, placement of antibiotic beads;  Surgeon: Cammy Copa, MD;  Location: [facility] OR;  Service: Orthopedics;  Laterality: Right;   KNEE ARTHROSCOPY Left 12/10/2021   Procedure: LEFT KNEE ARTHROSCOPY, DEBRIDEMENT, PLACEMENT OF STIMULAN BEADS;  Surgeon: Cammy Copa, MD;  Location: [facility];  Service: Orthopedics;  Laterality: Left;   LUMBAR DISC SURGERY  1992   "trimmed bulges off both sides"   MITRAL VALVE REPAIR Right 03/06/2016   Procedure: MINIMALLY INVASIVE MITRAL VALVE REPAIR (MVR) using a 34 Sorin Memo 3D Ring;  Surgeon: Purcell Nails, MD;  Location: [facility];  Service: Open Heart Surgery;  Laterality: Right;   TEE WITHOUT CARDIOVERSION N/A 01/05/2016   Procedure: TRANSESOPHAGEAL ECHOCARDIOGRAM (TEE);  Surgeon: Thurmon Fair, MD;  Location: [facility];  Service: Cardiovascular;  Laterality: N/A;   TEE WITHOUT CARDIOVERSION N/A 03/06/2016   Procedure: TRANSESOPHAGEAL ECHOCARDIOGRAM (TEE);  Surgeon: Purcell Nails, MD;  Location: [facility] OR;  Service: Open Heart Surgery;  Laterality: N/A;   TOTAL KNEE ARTHROPLASTY Right 09/03/2022   Procedure: RIGHT TOTAL KNEE ARTHROPLASTY-CEMENTED;  Surgeon: Cammy Copa, MD;  Location: [facility] OR;  Service: Orthopedics;  Laterality: Right;   TOTAL KNEE ARTHROPLASTY Left 12/12/2022   Procedure: LEFT TOTAL KNEE ARTHROPLASTY;  Surgeon: Cammy Copa, MD;  Location: [facility] OR;  Service: Orthopedics;  Laterality: Left;   TUBAL LIGATION     Social History   Social History Narrative   Not on file    There is no immunization history on file for this patient.   Objective: Vital Signs: BP (!) 143/97 (BP Location: Left Arm, Patient Position: Sitting, Cuff Size: Normal)   Pulse 77   Resp 16   Ht 5\' 6"  (1.676 m)   Wt 146 lb (66.2 kg)   BMI 23.57 kg/m    Physical Exam Eyes:     Conjunctiva/sclera: Conjunctivae normal.  Cardiovascular:     Rate and Rhythm: Normal rate and regular rhythm.  Pulmonary:     Effort: Pulmonary effort is normal.     Breath sounds: Normal breath sounds.  Musculoskeletal:     Right lower leg: No edema.     Left lower leg: No edema.  Lymphadenopathy:     Cervical: No cervical adenopathy.  Skin:    Findings: Rash present.     Comments: Raised, mildly erythematous patches along hairline  Neurological:     Mental Status: She is alert.  Psychiatric:        Mood and Affect: Mood normal.      Musculoskeletal Exam:  Shoulders full ROM no tenderness or swelling Elbows full ROM no tenderness or swelling Wrists full ROM no tenderness or swelling Fingers full ROM no tenderness or swelling Knees full ROM well healed surgical scars, no effusions   CDAI Exam: CDAI Score: 2  Patient Global: 10 mm; Provider Global: 10 mm Swollen: 0 ; Tender: 0  Joint Exam 04/07/2023   All documented joints were normal     Investigation: No additional findings.  Imaging: No results found.  Recent Labs: Lab Results  Component Value Date   WBC 4.8 12/06/2022   HGB 15.3 (H) 12/06/2022   PLT  12/06/2022    PLATELET CLUMPS NOTED ON SMEAR, COUNT APPEARS DECREASED   NA 140 12/06/2022   K 4.2 12/06/2022   CL 108 12/06/2022   CO2 22  12/06/2022   GLUCOSE 92 12/06/2022   BUN 10 12/06/2022   CREATININE 0.69 12/06/2022   BILITOT 0.4 11/28/2022   ALKPHOS 100 03/29/2022   AST 13 11/28/2022   ALT 9 11/28/2022   PROT 6.5 11/28/2022   ALBUMIN 3.8 03/29/2022   CALCIUM 8.9 12/06/2022   GFRAA >60 04/04/2020   QFTBGOLDPLUS NEGATIVE 02/08/2022    Speciality Comments: No specialty comments available.  Procedures:  No procedures performed Allergies: Vancomycin and Cyanocobalamin [vitamin b12]   Assessment / Plan:     Visit Diagnoses: Seronegative inflammatory arthritis - Plan: C-reactive protein  Inflammatory arthritis appears well-controlled today no ongoing joint swelling it knees are pain-free after total replacements.  Rechecking CRP for disease activity assessment today.  If inflammatory markers are looks okay combined with minimal disease activity we will start tapering sulfasalazine try decrease to 1000 mg twice daily.  If numbers equal or worse from before would recommend maintaining the current dose for now.  High risk medication use - Plan: CBC with Differential/Platelet, COMPLETE METABOLIC PANEL WITH GFR  Checking CBC and CMP for medication monitoring continuing on sulfasalazine.  She has not had any significant interval infections.  Arthritis of knee  Knee pain and swelling much improved after joint replacement surgery currently ambulatory with a cane for assistance.  Taking gabapentin, not requiring any opioid pain medication.  Orders: Orders Placed This Encounter  Procedures   C-reactive protein   CBC with Differential/Platelet   COMPLETE METABOLIC PANEL WITH GFR   No orders of the defined types were placed in this encounter.    Follow-Up Instructions: Return in about 3 months (around 07/07/2023) for RA on SSZ f/u 85mos.   Fuller Plan, MD  Note - This record has been created using [employer].  Chart creation errors have been sought, but may not always  have been located. Such creation  errors do not reflect on  the standard of medical care.

## 2023-04-08 LAB — COMPLETE METABOLIC PANEL WITH GFR
AG Ratio: 2.6 (calc) — ABNORMAL HIGH (ref 1.0–2.5)
ALT: 8 U/L (ref 6–29)
AST: 13 U/L (ref 10–35)
Albumin: 4.6 g/dL (ref 3.6–5.1)
Alkaline phosphatase (APISO): 75 U/L (ref 37–153)
BUN: 11 mg/dL (ref 7–25)
CO2: 28 mmol/L (ref 20–32)
Calcium: 9.5 mg/dL (ref 8.6–10.4)
Chloride: 106 mmol/L (ref 98–110)
Creat: 0.7 mg/dL (ref 0.60–1.00)
Globulin: 1.8 g/dL (calc) — ABNORMAL LOW (ref 1.9–3.7)
Glucose, Bld: 102 mg/dL — ABNORMAL HIGH (ref 65–99)
Potassium: 4.7 mmol/L (ref 3.5–5.3)
Sodium: 142 mmol/L (ref 135–146)
Total Bilirubin: 0.8 mg/dL (ref 0.2–1.2)
Total Protein: 6.4 g/dL (ref 6.1–8.1)
eGFR: 93 mL/min/{1.73_m2} (ref >=60)

## 2023-04-08 LAB — CBC WITH DIFFERENTIAL/PLATELET
Absolute Monocytes: 529 cells/uL (ref 200–950)
Basophils Absolute: 57 cells/uL (ref 0–200)
Basophils Relative: 0.9 %
Eosinophils Absolute: 120 cells/uL (ref 15–500)
Eosinophils Relative: 1.9 %
HCT: 45.5 % — ABNORMAL HIGH (ref 35.0–45.0)
Hemoglobin: 15.5 g/dL (ref 11.7–15.5)
Lymphs Abs: 1115 cells/uL (ref 850–3900)
MCH: 30.8 pg (ref 27.0–33.0)
MCHC: 34.1 g/dL (ref 32.0–36.0)
MCV: 90.5 fL (ref 80.0–100.0)
MPV: 10.8 fL (ref 7.5–12.5)
Monocytes Relative: 8.4 %
Neutro Abs: 4479 cells/uL (ref 1500–7800)
Neutrophils Relative %: 71.1 %
Platelets: 197 10*3/uL (ref 140–400)
RBC: 5.03 10*6/uL (ref 3.80–5.10)
RDW: 14.8 % (ref 11.0–15.0)
Total Lymphocyte: 17.7 %
WBC: 6.3 10*3/uL (ref 3.8–10.8)

## 2023-04-08 LAB — C-REACTIVE PROTEIN: CRP: 3.1 mg/L (ref <8.0)

## 2023-04-08 MED ORDER — SULFASALAZINE 500 MG PO TABS: 1000.0000 mg | ORAL_TABLET | Freq: Two times a day (BID) | ORAL | 0 refills | Status: DC

## 2023-04-08 NOTE — Progress Notes (Signed)
CRP is normal consistent with well controlled RA. Other labs look fine. I recommend she decrease the sulfasalazine to 1000 mg twice daily.

## 2023-04-08 NOTE — Addendum Note (Signed)
Addended by: Fuller Plan on: 04/08/2023 07:44 AM   Modules accepted: Orders

## 2023-05-01 ENCOUNTER — Telehealth: Payer: Self-pay

## 2023-05-01 NOTE — Telephone Encounter (Signed)
Patient contacted the office and states she has been taking Sulfasalazine. Patient states she broke out into a rash and welts. Patient states she quit taking the medication for around four days and everything cleared up. Patient states she tried to take the medication again yesterday morning and again broke out into welts and a rash. Patient states she is not going to take the medication to see if the rash and welts clear up. Patient states she wanted to let Dr. Dimple Casey know. Patient call back number is [phone]. Please advise.

## 2023-05-01 NOTE — Telephone Encounter (Signed)
Contacted the patient and advised That sounds fine to Dr. Dimple Casey. If she sees clearance of rash after stopping she should then remain off the medication. If joint inflammation starts to return off the medication we should follow up sooner than scheduled. Otherwise can just monitor off the drug at next follow up. Patient verbalized understanding.

## 2023-05-01 NOTE — Telephone Encounter (Signed)
That sounds fine to me. If she sees clearance of rash after stopping she should then remain off the medication. If joint inflammation starts to return off the medication we should follow up sooner than scheduled. Otherwise can just monitor off the drug at next follow up.

## 2023-05-30 ENCOUNTER — Other Ambulatory Visit: Payer: Self-pay | Admitting: Internal Medicine

## 2023-05-30 DIAGNOSIS — M1380 Other specified arthritis, unspecified site: Secondary | ICD-10-CM

## 2023-05-30 NOTE — Telephone Encounter (Signed)
Last Fill: 03/26/2023  Labs: 04/07/2023 CRP is normal consistent with well controlled RA. Other labs look fine. I recommend she decrease the sulfasalazine to 1000 mg twice daily.   Next Visit: 07/09/2023  Last Visit: 04/07/2023  DX: Seronegative inflammatory arthritis   Current Dose per office note 04/07/2023: sulfasalazine try decrease to 1000 mg twice daily   Okay to refill Sulfasalazine?

## 2023-06-09 HISTORY — PX: CATARACT EXTRACTION: SUR2

## 2023-06-23 HISTORY — PX: CATARACT EXTRACTION: SUR2

## 2023-07-09 ENCOUNTER — Ambulatory Visit: Payer: Medicare Other | Attending: Internal Medicine | Admitting: Internal Medicine

## 2023-07-09 ENCOUNTER — Encounter: Payer: Self-pay | Admitting: Internal Medicine

## 2023-07-09 VITALS — BP 138/91 | HR 75 | Resp 14 | Ht 66.0 in | Wt 155.0 lb

## 2023-07-09 DIAGNOSIS — Z96652 Presence of left artificial knee joint: Secondary | ICD-10-CM | POA: Diagnosis present

## 2023-07-09 DIAGNOSIS — M1380 Other specified arthritis, unspecified site: Secondary | ICD-10-CM | POA: Insufficient documentation

## 2023-07-09 DIAGNOSIS — Z79899 Other long term (current) drug therapy: Secondary | ICD-10-CM

## 2023-07-09 DIAGNOSIS — Z96651 Presence of right artificial knee joint: Secondary | ICD-10-CM | POA: Insufficient documentation

## 2023-07-09 NOTE — Patient Instructions (Signed)
For osteoarthritis several treatments may be beneficial:  - Topical antiinflammatory medicine such as diclofenac or Voltaren can be applied to  affected area as needed. Topical analgesics containing CBD, menthol, or lidocaine can be tried.  - Oral nonsteroidal antiinflammatory drugs (NSAIDs) such as ibuprofen, aleve, celebrex, or mobic are usually helpful for osteoarthritis. These should be taken intermittently or as needed, and always taken with food.  - Turmeric has some antiinflammatory effect similar to NSAIDs and may help, if taken as a supplement should not be taken above recommended doses.   - Compressive gloves or sleeve can be helpful to support the joint especially if hurting or swelling with certain activities.  - Physical therapy referral can discuss exercises or activity modification to improve symptoms or strength if needed.  - Local steroid injection is an option if symptoms become worse and not controlled by the above options. 

## 2023-07-09 NOTE — Progress Notes (Signed)
Office Visit Note  Patient: Becky Murphy             Date of Birth: February 22, 1951           MRN: [id number]             PCP: Kaleen Mask, MD Referring: Kaleen Mask, * Visit Date: 07/09/2023   Subjective:  Follow-up (Patient states she has stopped most medications since the reaction to sulfasalazine. Patient does not know which medications she should be taking. )   History of Present Illness: Becky Murphy is a 72 y.o. female here for follow up for seronegative rheumatoid arthritis.  She discontinued the sulfasalazine due to developing rashes that improved after stopping the medication.  She tried restarting it once with symptoms coming right back and so has been off the medication since April and doing well.  She is walking with a cane for support but overall doing very well with pretty much resolution of knee pain after replacements.  Still experiencing some pain and swelling in the right hand especially at the base of the thumb.   Previous HPI 04/07/23 Becky Murphy is a 72 y.o. 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  Since her last visit she had left knee replacement surgery which went well.  She completed the initial physical therapy course is doing really well currently walking now with a walker.  She is also been helping to take care and walking with her husband who had coronary artery bypass grafting.  She has not had any recurrent episodes of hand pain and swelling.  No significant interval infections or antibiotic use. She had some hair coloring agent exposure along her hairline with raised pink mildly itchy bumps for the past 6 weeks. Uses topical cortisone on these with partial improvement.   Previous HPI 11/28/22 Becky Murphy is a 72 y.o. 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  She has been doing quite well recently she has  completed her course of formal physical therapy for right knee replacement surgery with good strength and mobility not having a lot of pain.  There is still a small amount of swelling in the knee.  She is now scheduled for knee replacement surgery on the left side December 14.  This with her hands are doing well.   Previous HPI 08/14/22 Becky Murphy is a 72 y.o. female here for follow up or follow up for seronegative inflammatory arthritis. She is scheduled for right knee arthroplasty 09/03/22 with Dr. August Saucer. She has continued feeling a good improvement in joint pains on continued SSZ 1000 mg TID. She has not had any major intolerance or infections or drug reactions. Does continue to have knee swelling and limited mobility.   Previous HPI 02/08/22 Becky Murphy is a 72 y.o. female here for evaluation of recurrent bilateral knee effusions. These started about 2 years ago affecting the right knee. She has had repeated episodes of suspected infectious knee effusions with negative cultures. Treatment has included drainage, with subsequent surgical debridement twice in the right knee which improved symptoms a lot but never entirely to normal. Medications started with initial vancomycin treatment in April 2021. She suffered a reaction with rash developing that persists on her outer legs. Repeat antibiotics afterwards and again in April 2022 after additional surgery. She developed left knee effusion initially treated with serial  aspiration and then left knee arthroscopy and debridement in December. MRI imaging of the knee and thigh demonstrated significant synovitis and possible hemarthrosis. Currently her left knee is swollen and hot with pain and decreased range of movement. She is ambulatory with a walker at home and using wheelchair for long distance travel. She has noticed significant loss of upper leg strength throughout this process. In addition to knee symptoms she has developed bilateral wrist nodules and  pain she attributes to pressure having to transfer and stand using upper body due to leg pain and weakness. She has no past arthritis history before this started about 2 years ago. She has psoriasis typically affecting her upper torso not severe and not on long term medication for this. 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 She had mitral valve replacement by thoracotomy in 2017 without infection or inflammatory pathology noted.    Synopvial fluid repeat aspirations 02/2021-10/2021 WBCs 8000-45000 Neutrophils 58-95% Crystals negative Microbiology negative   11/30/2021 MRI left femur IMPRESSION: Large heterogeneous left knee joint effusion, partially visualized, with thick synovial enhancement suggestive of synovitis, possibly hemarthrosis. Intramuscular edema within the distal vastus medialis muscle, likely reactive. If clinically indicated, a dedicated left knee MRI may be useful for further evaluation.   Review of Systems  Constitutional:  Negative for fatigue.  HENT:  Negative for mouth sores and mouth dryness.   Eyes:  Negative for dryness.  Respiratory:  Negative for shortness of breath.   Cardiovascular:  Negative for chest pain and palpitations.  Gastrointestinal:  Negative for blood in stool, constipation and diarrhea.  Endocrine: Negative for increased urination.  Genitourinary:  Negative for involuntary urination.  Musculoskeletal:  Negative for joint pain, gait problem, joint pain, joint swelling, myalgias, muscle weakness, morning stiffness, muscle tenderness and myalgias.  Skin:  Positive for rash. Negative for color change, hair loss and sensitivity to sunlight.  Allergic/Immunologic: Negative for susceptible to infections.  Neurological:  Negative for dizziness and headaches.  Hematological:  Negative for swollen glands.  Psychiatric/Behavioral:  Negative for depressed mood and sleep disturbance.  The patient is not nervous/anxious.     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   High risk medication use 04/17/2022   Vitamin B 12 deficiency 04/03/2022   Iron deficiency anemia 03/29/2022   Bilateral wrist pain 02/27/2022   Hemarthrosis 02/08/2022   Vancomycin adverse reaction 04/19/2021   Therapeutic drug monitoring 04/19/2021   Long term (current) use of anticoagulants [Z79.01] 03/14/2016   S/P minimally invasive mitral valve repair 03/06/2016   Pancreatic mass 01/29/2016   Thyroid nodule 01/29/2016   Exertional dyspnea 01/05/2016   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   Mitral insufficiency    Mitral valve prolapse 12/26/2015   Anxiety 11/02/2015    Past Medical History:  Diagnosis Date   Anemia    low iron in the past   Anxiety    pt denies   Arthritis    "back?" (11/03/2015) knees/hands (08/28/22)   Chest pain 11/02/2015   Depression    pt denies   Exertional dyspnea 01/05/2016   GERD (gastroesophageal reflux disease)    Heart murmur    History of peptic  ulcer "late 1970's"   Migraine    "maybe 3-4/yr now" (11/03/2015)   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   Complex valvuloplasty including triangular resection of posterior leaflet, artificial Gore-tex neochord placement x6 and 34 mm Memo 3D  ring annuloplasty via right mini thoracotomy approach with clipping of LA appendage   Thyroid nodule 01/29/2016   2.4 cm enhancing mixed cystic and solid nodule inferior left thyroid gland noted on CT angiogram    Family History  Problem Relation Age of Onset   Hypertension Mother    Cancer Mother 25       MELANOMA   Cancer Father        PROSTATE   Cancer Sister 53       BREAST   Past Surgical History:  Procedure Laterality Date   BACK SURGERY     BREAST BIOPSY Left ~ 2005   BREAST LUMPECTOMY Left ~ 2005   CARDIAC CATHETERIZATION N/A 01/16/2016   Procedure: Right/Left Heart Cath and Coronary Angiography;  Surgeon: Thurmon Fair, MD;  Location: [facility];  Service: Cardiovascular;  Laterality: N/A;   CATARACT EXTRACTION Right 06/09/2023   CATARACT EXTRACTION Left 06/23/2023   CLIPPING OF ATRIAL APPENDAGE Left 03/06/2016   Procedure: CLIPPING OF LEFT ATRIAL APPENDAGE using a 45 PRO2 AtriClip;  Surgeon: Purcell Nails, MD;  Location: [facility] OR;  Service: Open Heart Surgery;  Laterality: Left;   COLONOSCOPY     EUS N/A 02/20/2016   Procedure: ESOPHAGEAL ENDOSCOPIC ULTRASOUND (EUS) RADIAL;  Surgeon: Willis Modena, MD;  Location: [facility] ENDOS[facility];  Service: Endoscopy;  Laterality: N/A;   EYE MUSCLE SURGERY Bilateral 1970's?   KNEE ARTHROSCOPY Right 04/04/2020   Procedure: RIGHT KNEE ARTHROSCOPY WITH DEBRIDEMENT;  Surgeon: Cammy Copa, MD;  Location: [facility] OR;  Service: Orthopedics;  Laterality: Right;   KNEE ARTHROSCOPY Right 04/10/2021   Procedure: right knee arthroscopy, debridement, placement of antibiotic beads;  Surgeon: Cammy Copa, MD;  Location: [facility] OR;  Service: Orthopedics;  Laterality: Right;   KNEE ARTHROSCOPY Left 12/10/2021   Procedure: LEFT KNEE ARTHROSCOPY, DEBRIDEMENT, PLACEMENT OF STIMULAN BEADS;  Surgeon: Cammy Copa, MD;  Location: [facility] OR;  Service: Orthopedics;  Laterality: Left;   LUMBAR DISC SURGERY  12/30/1990   "trimmed bulges off both  sides"   MITRAL VALVE REPAIR Right 03/06/2016   Procedure: MINIMALLY INVASIVE MITRAL VALVE REPAIR (MVR) using a 34 Sorin Memo 3D Ring;  Surgeon: Purcell Nails, MD;  Location: [facility] OR;  Service: Open Heart Surgery;  Laterality: Right;   TEE WITHOUT CARDIOVERSION N/A 01/05/2016   Procedure: TRANSESOPHAGEAL ECHOCARDIOGRAM (TEE);  Surgeon: Thurmon Fair, MD;  Location: [facility] ENDOSCOPY;  Service: Cardiovascular;  Laterality: N/A;   TEE WITHOUT CARDIOVERSION N/A 03/06/2016   Procedure: TRANSESOPHAGEAL ECHOCARDIOGRAM (TEE);  Surgeon: Purcell Nails, MD;  Location[facility];  Service: Open Heart Surgery;  Laterality: N/A;   TOTAL KNEE ARTHROPLASTY Right 09/03/2022   Procedure: RIGHT TOTAL KNEE ARTHROPLASTY-CEMENTED;  Surgeon: Cammy Copa, MD;  Location: [facility] OR;  Service: Orthopedics;  Laterality: Right;   TOTAL KNEE ARTHROPLASTY Left 12/12/2022   Procedure: LEFT TOTAL KNEE  ARTHROPLASTY;  Surgeon: Cammy Copa, MD;  Location: [facility] OR;  Service: Orthopedics;  Laterality: Left;   TUBAL LIGATION     Social History   Social History Narrative   Not on file    There is no immunization history on file for this patient.   Objective: Vital Signs: BP (!) 138/91 (BP Location: Left Arm, Patient Position: Sitting, Cuff Size: Normal)   Pulse 75   Resp 14   Ht 5\' 6"  (1.676 m)   Wt 155 lb (70.3 kg)   BMI 25.02 kg/m    Physical Exam Eyes:     Conjunctiva/sclera: Conjunctivae normal.  Cardiovascular:     Rate and Rhythm: Normal rate and regular rhythm.  Pulmonary:     Effort: Pulmonary effort is normal.     Breath sounds: Normal breath sounds.  Musculoskeletal:     Right lower leg: No edema.     Left lower leg: No edema.  Lymphadenopathy:     Cervical: No cervical adenopathy.  Skin:    General: Skin is warm and dry.     Findings: No rash.  Neurological:     Mental Status: She is alert.  Psychiatric:        Mood and Affect: Mood normal.      Musculoskeletal Exam:  Shoulders full ROM  no tenderness or swelling Elbows full ROM no tenderness or swelling Wrists full ROM no tenderness or swelling Fingers full ROM there is mild swelling and tenderness to pressure at the right first [facility] joint, with chronic bony change present on exam including limited musculoskeletal ultrasound Knees bilateral placements no tenderness no palpable effusion   CDAI Exam: CDAI Score: 2  Patient Global: 10 / 100; Provider Global: 10 / 100 Swollen: 1 ; Tender: 1  Joint Exam 07/09/2023      Right  Left  [facility]  Swollen Tender        Investigation: No additional findings.  Imaging: No results found.  Recent Labs: Lab Results  Component Value Date   WBC 6.3 04/07/2023   HGB 15.5 04/07/2023   PLT 197 04/07/2023   NA 142 04/07/2023   K 4.7 04/07/2023   CL 106 04/07/2023   CO2 28 04/07/2023   GLUCOSE 102 (H) 04/07/2023   BUN 11 04/07/2023   CREATININE 0.70 04/07/2023   BILITOT 0.8 04/07/2023   ALKPHOS 100 03/29/2022   AST 13 04/07/2023   ALT 8 04/07/2023   PROT 6.4 04/07/2023   ALBUMIN 3.8 03/29/2022   CALCIUM 9.5 04/07/2023   GFRAA >60 04/04/2020   QFTBGOLDPLUS NEGATIVE 02/08/2022    Speciality Comments: No specialty comments available.  Procedures:  No procedures performed Allergies: Vancomycin and Cyanocobalamin [vitamin b12]   Assessment / Plan:     Visit Diagnoses: Seronegative inflammatory arthritis - Plan: C-reactive protein  There is some inflammation of the first [facility] joint but I suspect this is related to underlying osteoarthritis as much is anything.  Is using cane for support in the right hand that could be a factor.  Discussed option for intra-articular steroid injection if this gets worse. I think she is okay to continue off of any RA medication for now we will recheck the CRP which was her severely abnormal lab marker prior to treatment if normal can just monitor for now.  S/P total knee arthroplasty, left S/P total knee arthroplasty, right  She is doing  extremely well with no recurrence of severe knee pain or inflammation after joint replacements on both sides.  Orders: Orders Placed This Encounter  Procedures   C-reactive protein   No orders of the defined types were placed in this encounter.    Follow-Up Instructions: Return in about 6 months (around 01/09/2024), or if symptoms worsen or fail to improve, for OA/?RA f/u 6mos.   Fuller Plan, MD  Note - This record has been created using [employer].  Chart creation errors have been sought, but may not always  have been located. Such creation errors do not reflect on  the standard of medical care.

## 2023-07-10 LAB — C-REACTIVE PROTEIN: CRP: <3 mg/L (ref <8.0)

## 2023-07-14 NOTE — Progress Notes (Signed)
CRP is completely normal now so inflammation looks great. She can just stay off the sulfasalazine without starting any alternative drug right now. We can monitor for now either 6 months or if she experiences any flare up of symptoms.

## 2023-12-25 NOTE — Progress Notes (Deleted)
 Office Visit Note  Patient: Becky Murphy             Date of Birth: [birth year]           MRN: [id number]             PCP: Kaleen Mask, MD Referring: Kaleen Mask, * Visit Date: 01/07/2024   Subjective:  No chief complaint on file.   History of Present Illness: Becky Murphy is a 72 y.o. female here for follow up for seronegative rheumatoid arthritis.    Previous HPI 07/09/2023 Becky Murphy is a 71 y.o. female here for follow up for seronegative rheumatoid arthritis.  She discontinued the sulfasalazine due to developing rashes that improved after stopping the medication.  She tried restarting it once with symptoms coming right back and so has been off the medication since April and doing well.  She is walking with a cane for support but overall doing very well with pretty much resolution of knee pain after replacements.  Still experiencing some pain and swelling in the right hand especially at the base of the thumb.     Previous HPI 04/07/23 Becky Murphy is a 72 y.o. 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  Since her last visit she had left knee replacement surgery which went well.  She completed the initial physical therapy course is doing really well currently walking now with a walker.  She is also been helping to take care and walking with her husband who had coronary artery bypass grafting.  She has not had any recurrent episodes of hand pain and swelling.  No significant interval infections or antibiotic use. She had some hair coloring agent exposure along her hairline with raised pink mildly itchy bumps for the past 6 weeks. Uses topical cortisone on these with partial improvement.   Previous HPI 11/28/22 Becky Murphy is a 72 y.o. 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  She has been doing quite well recently she has completed  her course of formal physical therapy for right knee replacement surgery with good strength and mobility not having a lot of pain.  There is still a small amount of swelling in the knee.  She is now scheduled for knee replacement surgery on the left side December 14.  This with her hands are doing well.   Previous HPI 08/14/22 Becky Murphy is a 72 y.o. female here for follow up or follow up for seronegative inflammatory arthritis. She is scheduled for right knee arthroplasty 09/03/22 with Dr. August Saucer. She has continued feeling a good improvement in joint pains on continued SSZ 1000 mg TID. She has not had any major intolerance or infections or drug reactions. Does continue to have knee swelling and limited mobility.   Previous HPI 02/08/22 Becky Murphy is a 72 y.o. female here for evaluation of recurrent bilateral knee effusions. These started about 2 years ago affecting the right knee. She has had repeated episodes of suspected infectious knee effusions with negative cultures. Treatment has included drainage, with subsequent surgical debridement twice in the right knee which improved symptoms a lot but never entirely to normal. Medications started with initial vancomycin treatment in April 2021. She suffered a reaction with rash developing that persists on her outer legs. Repeat antibiotics afterwards and again in April 2022 after additional surgery. She developed left knee  effusion initially treated with serial aspiration and then left knee arthroscopy and debridement in December. MRI imaging of the knee and thigh demonstrated significant synovitis and possible hemarthrosis. Currently her left knee is swollen and hot with pain and decreased range of movement. She is ambulatory with a walker at home and using wheelchair for long distance travel. She has noticed significant loss of upper leg strength throughout this process. In addition to knee symptoms she has developed bilateral wrist nodules and pain she  attributes to pressure having to transfer and stand using upper body due to leg pain and weakness. She has no past arthritis history before this started about 2 years ago. She has psoriasis typically affecting her upper torso not severe and not on long term medication for this. 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 She had mitral valve replacement by thoracotomy in 2017 without infection or inflammatory pathology noted.    Synopvial fluid repeat aspirations 02/2021-10/2021 WBCs 8000-45000 Neutrophils 58-95% Crystals negative Microbiology negative   11/30/2021 MRI left femur IMPRESSION: Large heterogeneous left knee joint effusion, partially visualized, with thick synovial enhancement suggestive of synovitis, possibly hemarthrosis. Intramuscular edema within the distal vastus medialis muscle, likely reactive. If clinically indicated, a dedicated left knee MRI may be useful for further evaluation.   No Rheumatology ROS completed.   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   High risk medication use 04/17/2022   Vitamin B 12 deficiency 04/03/2022   Iron deficiency anemia 03/29/2022   Bilateral wrist pain 02/27/2022   Hemarthrosis 02/08/2022   Vancomycin adverse reaction 04/19/2021   Therapeutic drug monitoring 04/19/2021   Long term (current) use of anticoagulants [Z79.01] 03/14/2016   S/P minimally invasive mitral valve repair 03/06/2016   Pancreatic mass 01/29/2016   Thyroid nodule 01/29/2016   Exertional dyspnea 01/05/2016   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   Mitral insufficiency    Mitral valve prolapse  12/26/2015   Anxiety 11/02/2015    Past Medical History:  Diagnosis Date   Anemia    low iron in the past   Anxiety    pt denies   Arthritis    "back?" (11/03/2015) knees/hands (08/28/22)   Chest pain 11/02/2015   Depression    pt denies   Exertional dyspnea 01/05/2016   GERD (gastroesophageal reflux disease)    Heart murmur    History of peptic ulcer "late 1970's"   Migraine    "maybe 3-4/yr now" (11/03/2015)   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   Complex valvuloplasty including triangular resection of posterior leaflet, artificial Gore-tex neochord placement x6 and 34 mm Memo 3D ring annuloplasty via right mini thoracotomy approach with clipping of LA appendage   Thyroid nodule 01/29/2016   2.4 cm enhancing mixed cystic and solid nodule inferior left thyroid  gland noted on CT angiogram    Family History  Problem Relation Age of Onset   Hypertension Mother    Cancer Mother 15       MELANOMA   Cancer Father        PROSTATE   Cancer Sister 63       BREAST   Past Surgical History:  Procedure Laterality Date   BACK SURGERY     BREAST BIOPSY Left ~ 2005   BREAST LUMPECTOMY Left ~ 2005   CARDIAC CATHETERIZATION N/A 01/16/2016   Procedure: Right/Left Heart Cath and Coronary Angiography;  Surgeon: Thurmon Fair, MD;  Location: [facility];  Service: Cardiovascular;  Laterality: N/A;   CATARACT EXTRACTION Right 06/09/2023   CATARACT EXTRACTION Left 06/23/2023   CLIPPING OF ATRIAL APPENDAGE Left 03/06/2016   Procedure: CLIPPING OF LEFT ATRIAL APPENDAGE using a 45 PRO2 AtriClip;   Surgeon: Purcell Nails, MD;  Location: [facility] OR;  Service: Open Heart Surgery;  Laterality: Left;   COLONOSCOPY     EUS N/A 02/20/2016   Procedure: ESOPHAGEAL ENDOSCOPIC ULTRASOUND (EUS) RADIAL;  Surgeon: Willis Modena, MD;  Location: [facility];  Service: Endoscopy;  Laterality: N/A;   EYE MUSCLE SURGERY Bilateral 1970's?   KNEE ARTHROSCOPY Right 04/04/2020   Procedure: RIGHT KNEE ARTHROSCOPY WITH DEBRIDEMENT;  Surgeon: Cammy Copa, MD;  Location: [facility] OR;  Service: Orthopedics;  Laterality: Right;   KNEE ARTHROSCOPY Right 04/10/2021   Procedure: right knee arthroscopy, debridement, placement of antibiotic beads;  Surgeon: Cammy Copa, MD;  Location: [facility] OR;  Service: Orthopedics;  Laterality: Right;   KNEE ARTHROSCOPY Left 12/10/2021   Procedure: LEFT KNEE ARTHROSCOPY, DEBRIDEMENT, PLACEMENT OF STIMULAN BEADS;  Surgeon: Cammy Copa, MD;  Location: [facility] OR;  Service: Orthopedics;  Laterality: Left;   LUMBAR DISC SURGERY  12/30/1990   "trimmed bulges off both sides"   MITRAL VALVE REPAIR Right 03/06/2016   Procedure: MINIMALLY INVASIVE MITRAL VALVE REPAIR (MVR) using a 34 Sorin Memo 3D Ring;  Surgeon: Purcell Nails, MD;  Location: [facility] OR;  Service: Open Heart Surgery;  Laterality: Right;   TEE WITHOUT CARDIOVERSION N/A 01/05/2016   Procedure: TRANSESOPHAGEAL ECHOCARDIOGRAM (TEE);  Surgeon: Thurmon Fair, MD;  Location: [facility] ENDOSCOPY;  Service: Cardiovascular;  Laterality: N/A;   TEE WITHOUT CARDIOVERSION N/A 03/06/2016   Procedure: TRANSESOPHAGEAL ECHOCARDIOGRAM (TEE);  Surgeon: Purcell Nails, MD;  Location: [facility] OR;  Service: Open Heart Surgery;  Laterality: N/A;   TOTAL KNEE ARTHROPLASTY Right 09/03/2022   Procedure: RIGHT TOTAL KNEE ARTHROPLASTY-CEMENTED;  Surgeon: Cammy Copa, MD;  Location: [facility] OR;  Service: Orthopedics;  Laterality: Right;   TOTAL KNEE ARTHROPLASTY Left 12/12/2022   Procedure: LEFT TOTAL KNEE ARTHROPLASTY;  Surgeon: Cammy Copa, MD;   Location: [facility] OR;  Service: Orthopedics;  Laterality: Left;   TUBAL LIGATION     Social History   Social History Narrative   Not on file    There is no immunization history on file for this patient.   Objective: Vital Signs: There were no vitals taken for this visit.   Physical Exam   Musculoskeletal Exam: ***  CDAI Exam: CDAI Score: -- Patient Global: --; Provider Global: -- Swollen: --; Tender: -- Joint Exam 01/07/2024   No joint exam has been documented for this visit   There is currently no information documented on the homunculus. Go to the Rheumatology activity and complete the homunculus joint exam.  Investigation: No additional findings.  Imaging: No results found.  Recent  Labs: Lab Results  Component Value Date   WBC 6.3 04/07/2023   HGB 15.5 04/07/2023   PLT 197 04/07/2023   NA 142 04/07/2023   K 4.7 04/07/2023   CL 106 04/07/2023   CO2 28 04/07/2023   GLUCOSE 102 (H) 04/07/2023   BUN 11 04/07/2023   CREATININE 0.70 04/07/2023   BILITOT 0.8 04/07/2023   ALKPHOS 100 03/29/2022   AST 13 04/07/2023   ALT 8 04/07/2023   PROT 6.4 04/07/2023   ALBUMIN 3.8 03/29/2022   CALCIUM 9.5 04/07/2023   GFRAA >60 04/04/2020   QFTBGOLDPLUS NEGATIVE 02/08/2022    Speciality Comments: No specialty comments available.  Procedures:  No procedures performed Allergies: Vancomycin and Cyanocobalamin [vitamin b12]   Assessment / Plan:     Visit Diagnoses: No diagnosis found.  ***  Orders: No orders of the defined types were placed in this encounter.  No orders of the defined types were placed in this encounter.    Follow-Up Instructions: No follow-ups on file.   Metta Clines, RT  Note - This record has been created using [employer].  Chart creation errors have been sought, but may not always  have been located. Such creation errors do not reflect on  the standard of medical care.

## 2024-01-07 ENCOUNTER — Ambulatory Visit: Payer: Medicare Other | Admitting: Internal Medicine

## 2024-01-07 DIAGNOSIS — Z96652 Presence of left artificial knee joint: Secondary | ICD-10-CM

## 2024-01-07 DIAGNOSIS — Z96651 Presence of right artificial knee joint: Secondary | ICD-10-CM

## 2024-01-07 DIAGNOSIS — M1380 Other specified arthritis, unspecified site: Secondary | ICD-10-CM
